# Patient Record
Sex: Male | Born: 1980
Health system: Southern US, Community
[De-identification: ages and names within clinical notes are randomized; demographics above are authoritative.]

## PROBLEM LIST (undated history)

## (undated) DIAGNOSIS — F419 Anxiety disorder, unspecified: Secondary | ICD-10-CM

## (undated) HISTORY — DX: Anxiety disorder, unspecified: F41.9

---

## 2005-04-29 ENCOUNTER — Emergency Department (HOSPITAL_COMMUNITY): Admission: EM | Admit: 2005-04-29 | Discharge: 2005-04-29 | Payer: Self-pay | Admitting: Emergency Medicine

## 2008-04-22 ENCOUNTER — Emergency Department (HOSPITAL_COMMUNITY): Admission: EM | Admit: 2008-04-22 | Discharge: 2008-04-22 | Payer: Self-pay | Admitting: Emergency Medicine

## 2008-05-22 ENCOUNTER — Emergency Department (HOSPITAL_COMMUNITY): Admission: EM | Admit: 2008-05-22 | Discharge: 2008-05-22 | Payer: Self-pay | Admitting: Emergency Medicine

## 2010-06-15 LAB — RAPID URINE DRUG SCREEN, HOSP PERFORMED
Amphetamines: NOT DETECTED
Barbiturates: NOT DETECTED
Benzodiazepines: NOT DETECTED
Cocaine: NOT DETECTED

## 2010-06-15 LAB — COMPREHENSIVE METABOLIC PANEL
ALT: 53 U/L (ref 0–53)
Alkaline Phosphatase: 69 U/L (ref 39–117)
BUN: 7 mg/dL (ref 6–23)
Calcium: 8.9 mg/dL (ref 8.4–10.5)
GFR calc Af Amer: >60 mL/min (ref >60)
Glucose, Bld: 144 mg/dL — ABNORMAL HIGH (ref 70–99)
Total Bilirubin: 0.5 mg/dL (ref 0.3–1.2)

## 2010-06-15 LAB — CBC
Hemoglobin: 15.1 g/dL (ref 13.0–17.0)
MCHC: 33.6 g/dL (ref 30.0–36.0)
MCV: 79.4 fL (ref 78.0–100.0)
Platelets: 174 10*3/uL (ref 150–400)
RDW: 13.4 % (ref 11.5–15.5)
WBC: 5.9 10*3/uL (ref 4.0–10.5)

## 2010-06-15 LAB — DIFFERENTIAL
Eosinophils Relative: 1 % (ref 0–5)
Lymphs Abs: 2 10*3/uL (ref 0.7–4.0)
Monocytes Absolute: 0.4 10*3/uL (ref 0.1–1.0)

## 2010-06-15 LAB — ACETAMINOPHEN LEVEL
Acetaminophen (Tylenol), Serum: 12.6 ug/mL (ref 10–30)
Acetaminophen (Tylenol), Serum: <10 ug/mL — ABNORMAL LOW (ref 10–30)

## 2010-10-06 ENCOUNTER — Observation Stay (HOSPITAL_COMMUNITY)
Admission: EM | Admit: 2010-10-06 | Discharge: 2010-10-07 | Disposition: A | Discharge status: Home / Self Care | Payer: Self-pay | Attending: Emergency Medicine | Admitting: Emergency Medicine

## 2010-10-06 DIAGNOSIS — E119 Type 2 diabetes mellitus without complications: Principal | ICD-10-CM | POA: Insufficient documentation

## 2010-10-06 DIAGNOSIS — R42 Dizziness and giddiness: Secondary | ICD-10-CM | POA: Insufficient documentation

## 2010-10-06 LAB — GLUCOSE, CAPILLARY: Glucose-Capillary: >600 mg/dL (ref 70–99)

## 2010-10-07 LAB — URINE MICROSCOPIC-ADD ON

## 2010-10-07 LAB — URINALYSIS, ROUTINE W REFLEX MICROSCOPIC
Bilirubin Urine: NEGATIVE
Ketones, ur: NEGATIVE mg/dL
Leukocytes, UA: NEGATIVE
Nitrite: NEGATIVE
Specific Gravity, Urine: 1.036 — ABNORMAL HIGH (ref 1.005–1.030)
Urobilinogen, UA: 0.2 mg/dL (ref 0.0–1.0)

## 2010-10-07 LAB — CBC
MCHC: 34.4 g/dL (ref 30.0–36.0)
Platelets: 150 10*3/uL (ref 150–400)
RBC: 5.75 MIL/uL (ref 4.22–5.81)
RDW: 12.7 % (ref 11.5–15.5)
WBC: 4.8 10*3/uL (ref 4.0–10.5)

## 2010-10-07 LAB — DIFFERENTIAL
Eosinophils Absolute: 0 10*3/uL (ref 0.0–0.7)
Eosinophils Relative: 1 % (ref 0–5)
Lymphocytes Relative: 48 % — ABNORMAL HIGH (ref 12–46)
Lymphs Abs: 2.3 10*3/uL (ref 0.7–4.0)
Monocytes Relative: 7 % (ref 3–12)
Neutro Abs: 2.1 10*3/uL (ref 1.7–7.7)
Neutrophils Relative %: 44 % (ref 43–77)

## 2010-10-07 LAB — BASIC METABOLIC PANEL
BUN: 7 mg/dL (ref 6–23)
CO2: 25 mEq/L (ref 19–32)
Calcium: 9.4 mg/dL (ref 8.4–10.5)
Chloride: 96 mEq/L (ref 96–112)
Glucose, Bld: 649 mg/dL (ref 70–99)

## 2010-10-07 LAB — GLUCOSE, CAPILLARY
Glucose-Capillary: 423 mg/dL — ABNORMAL HIGH (ref 70–99)
Glucose-Capillary: 311 mg/dL — ABNORMAL HIGH (ref 70–99)
Glucose-Capillary: 279 mg/dL — ABNORMAL HIGH (ref 70–99)

## 2010-11-25 ENCOUNTER — Other Ambulatory Visit: Payer: Self-pay | Admitting: Infectious Diseases

## 2010-11-25 ENCOUNTER — Ambulatory Visit
Admission: RE | Admit: 2010-11-25 | Discharge: 2010-11-25 | Disposition: A | Discharge status: Home / Self Care | Payer: No Typology Code available for payment source | Source: Ambulatory Visit | Attending: Infectious Diseases | Admitting: Infectious Diseases

## 2010-11-25 DIAGNOSIS — R7611 Nonspecific reaction to tuberculin skin test without active tuberculosis: Secondary | ICD-10-CM

## 2011-09-10 ENCOUNTER — Encounter (HOSPITAL_COMMUNITY): Payer: Self-pay

## 2011-09-10 ENCOUNTER — Emergency Department (HOSPITAL_COMMUNITY)
Admission: EM | Admit: 2011-09-10 | Discharge: 2011-09-10 | Disposition: A | Discharge status: Home / Self Care | Payer: Self-pay | Attending: Emergency Medicine | Admitting: Emergency Medicine

## 2011-09-10 DIAGNOSIS — K089 Disorder of teeth and supporting structures, unspecified: Secondary | ICD-10-CM | POA: Insufficient documentation

## 2011-09-10 DIAGNOSIS — K0889 Other specified disorders of teeth and supporting structures: Secondary | ICD-10-CM

## 2011-09-10 DIAGNOSIS — E119 Type 2 diabetes mellitus without complications: Secondary | ICD-10-CM | POA: Insufficient documentation

## 2011-09-10 MED ORDER — PENICILLIN V POTASSIUM 250 MG PO TABS: 250.0000 mg | ORAL_TABLET | Freq: Four times a day (QID) | ORAL | Status: AC

## 2011-09-10 MED ORDER — HYDROCODONE-ACETAMINOPHEN 5-325 MG PO TABS: 1.0000 | ORAL_TABLET | Freq: Four times a day (QID) | ORAL | Status: AC | PRN

## 2011-09-10 NOTE — ED Notes (Addendum)
Pt states he has had toothaches in the past and began having tooth pain.  Pt states he took pain meds with little relief.  Pt then began having pain to left side of face, especially the left eye.  Pt rates pain as 7/10 and described as aching.  Pt also states eye is watery.  Pt denies trauma to the face/eye.  Pt denies vision changes.

## 2011-09-10 NOTE — ED Provider Notes (Signed)
History     CSN: [id number]  Arrival date & time 09/10/11  0840   First MD Initiated Contact with Patient 09/10/11 [phone]      Chief Complaint  Patient presents with  . Eye Pain  . Dental Pain    (Consider location/radiation/quality/duration/timing/severity/associated sxs/prior treatment) HPI History from patient. 31 year old male with past medical history diabetes presents with dental pain. He states this started 3-4 days ago and has persisted. Pain is located to the left upper mouth. He denies noticing any drainage or swelling of his gums. No difficulty opening his mouth or eating although food exacerbates the pain. He has had some associated pain radiating to his left ear and watering of his eye. Also states that he has had a dual achy headache intermittently on the left side of his head. No fever or chills. No visual change or dizziness. Patient is not currently followed by a dentist.  Past Medical History  Diagnosis Date  . Diabetes mellitus     History reviewed. No pertinent past surgical history.  No family history on file.  History  Substance Use Topics  . Smoking status: Never Smoker   . Smokeless tobacco: Not on file  . Alcohol Use: Yes      Review of Systems as per history of present illness  Allergies  Review of patient's allergies indicates no known allergies.  Home Medications   Current Outpatient Rx  Name Route Sig Dispense Refill  . NAPROXEN SODIUM 220 MG PO TABS Oral Take 220 mg by mouth 4 (four) times daily as needed. pain    . HYDROCODONE-ACETAMINOPHEN 5-325 MG PO TABS Oral Take 1 tablet by mouth every 6 (six) hours as needed for pain. 15 tablet 0  . PENICILLIN V POTASSIUM 250 MG PO TABS Oral Take 1 tablet (250 mg total) by mouth 4 (four) times daily. 40 tablet 0    BP 129/81  Pulse 88  Temp 99.1 F (37.3 C) (Oral)  Resp 20  SpO2 99%  Physical Exam  Nursing note and vitals reviewed. Constitutional: He is oriented to person, place, and time.  He appears well-developed and well-nourished. No distress.  HENT:  Head: Normocephalic and atraumatic.  Mouth/Throat:         Tender to palpation as diagrammed. Dental decay seen to these 2 teeth. No obvious gum swelling or purulence or evidence of abscess. Uvula midline. No trismus or malocclusion, handling secretions.  Eyes: Conjunctivae and EOM are normal. Pupils are equal, round, and reactive to light. Right eye exhibits no discharge. Left eye exhibits no discharge.  Neck: Normal range of motion. Neck supple.  Cardiovascular: Normal rate.   Pulmonary/Chest: Effort normal.  Musculoskeletal: Normal range of motion.  Lymphadenopathy:    He has no cervical adenopathy.  Neurological: He is alert and oriented to person, place, and time. No cranial nerve deficit.  Skin: Skin is warm and dry. He is not diaphoretic.  Psychiatric: He has a normal mood and affect.    ED Course  Procedures (including critical care time)  Labs Reviewed - No data to display No results found.   1. Pain, dental       MDM  Patient presents with dental pain. He also endorses watering of his eye and unilateral headache on the side of his pain. Suspect these are stemming from his dental pain. Will give coverage with penicillin and treat pain. Emphasized importance of following up with dentistry. Reasons to return discussed.        Santina Evans  [place], [place] 09/10/11 [phone]

## 2011-09-11 NOTE — ED Provider Notes (Signed)
Medical screening examination/treatment/procedure(s) were performed by non-physician practitioner and as supervising physician I was immediately available for consultation/collaboration.  Letesha Klecker, MD 09/11/11 0719 

## 2012-03-22 ENCOUNTER — Emergency Department (HOSPITAL_COMMUNITY)
Admission: EM | Admit: 2012-03-22 | Discharge: 2012-03-22 | Disposition: A | Discharge status: Home / Self Care | Payer: Self-pay | Attending: Emergency Medicine | Admitting: Emergency Medicine

## 2012-03-22 ENCOUNTER — Emergency Department (HOSPITAL_COMMUNITY): Payer: Self-pay

## 2012-03-22 ENCOUNTER — Inpatient Hospital Stay (HOSPITAL_COMMUNITY)
Admission: EM | Admit: 2012-03-22 | Discharge: 2012-03-24 | DRG: 392 | Disposition: A | Discharge status: Home / Self Care | Payer: Self-pay | Attending: Family Medicine | Admitting: Family Medicine

## 2012-03-22 ENCOUNTER — Encounter (HOSPITAL_COMMUNITY): Payer: Self-pay | Admitting: Emergency Medicine

## 2012-03-22 DIAGNOSIS — R112 Nausea with vomiting, unspecified: Secondary | ICD-10-CM | POA: Diagnosis present

## 2012-03-22 DIAGNOSIS — R109 Unspecified abdominal pain: Secondary | ICD-10-CM | POA: Diagnosis present

## 2012-03-22 DIAGNOSIS — A084 Viral intestinal infection, unspecified: Secondary | ICD-10-CM | POA: Diagnosis present

## 2012-03-22 DIAGNOSIS — K529 Noninfective gastroenteritis and colitis, unspecified: Secondary | ICD-10-CM

## 2012-03-22 DIAGNOSIS — R739 Hyperglycemia, unspecified: Secondary | ICD-10-CM

## 2012-03-22 DIAGNOSIS — E119 Type 2 diabetes mellitus without complications: Secondary | ICD-10-CM

## 2012-03-22 DIAGNOSIS — A09 Infectious gastroenteritis and colitis, unspecified: Secondary | ICD-10-CM

## 2012-03-22 DIAGNOSIS — A088 Other specified intestinal infections: Principal | ICD-10-CM | POA: Diagnosis present

## 2012-03-22 DIAGNOSIS — E1169 Type 2 diabetes mellitus with other specified complication: Secondary | ICD-10-CM | POA: Insufficient documentation

## 2012-03-22 DIAGNOSIS — Z9119 Patient's noncompliance with other medical treatment and regimen: Secondary | ICD-10-CM

## 2012-03-22 DIAGNOSIS — E860 Dehydration: Secondary | ICD-10-CM | POA: Insufficient documentation

## 2012-03-22 DIAGNOSIS — R7989 Other specified abnormal findings of blood chemistry: Secondary | ICD-10-CM

## 2012-03-22 DIAGNOSIS — Z91199 Patient's noncompliance with other medical treatment and regimen due to unspecified reason: Secondary | ICD-10-CM

## 2012-03-22 DIAGNOSIS — R824 Acetonuria: Secondary | ICD-10-CM

## 2012-03-22 DIAGNOSIS — Z79899 Other long term (current) drug therapy: Secondary | ICD-10-CM | POA: Insufficient documentation

## 2012-03-22 DIAGNOSIS — IMO0001 Reserved for inherently not codable concepts without codable children: Secondary | ICD-10-CM | POA: Diagnosis present

## 2012-03-22 LAB — CBC WITH DIFFERENTIAL/PLATELET
Basophils Absolute: 0 10*3/uL (ref 0.0–0.1)
Eosinophils Relative: 1 % (ref 0–5)
Lymphocytes Relative: 40 % (ref 12–46)
Neutro Abs: 1.5 10*3/uL — ABNORMAL LOW (ref 1.7–7.7)
Platelets: 121 10*3/uL — ABNORMAL LOW (ref 150–400)
RDW: 12.5 % (ref 11.5–15.5)
WBC: 3.7 10*3/uL — ABNORMAL LOW (ref 4.0–10.5)

## 2012-03-22 LAB — URINALYSIS, ROUTINE W REFLEX MICROSCOPIC
Glucose, UA: >1000 mg/dL — AB
Hgb urine dipstick: NEGATIVE
Hgb urine dipstick: NEGATIVE
Ketones, ur: >80 mg/dL — AB
Protein, ur: NEGATIVE mg/dL
Specific Gravity, Urine: 1.038 — ABNORMAL HIGH (ref 1.005–1.030)
Urobilinogen, UA: 0.2 mg/dL (ref 0.0–1.0)

## 2012-03-22 LAB — COMPREHENSIVE METABOLIC PANEL
ALT: 11 U/L (ref 0–53)
AST: 13 U/L (ref 0–37)
CO2: 27 mEq/L (ref 19–32)
Calcium: 9.8 mg/dL (ref 8.4–10.5)
GFR calc non Af Amer: >90 mL/min (ref >90)
Sodium: 136 mEq/L (ref 135–145)
Total Protein: 7.6 g/dL (ref 6.0–8.3)

## 2012-03-22 LAB — POCT I-STAT, CHEM 8
Glucose, Bld: 214 mg/dL — ABNORMAL HIGH (ref 70–99)
HCT: 49 % (ref 39.0–52.0)
HCT: 41 % (ref 39.0–52.0)
Hemoglobin: 16.7 g/dL (ref 13.0–17.0)
Hemoglobin: 13.9 g/dL (ref 13.0–17.0)
Potassium: 3.4 mEq/L — ABNORMAL LOW (ref 3.5–5.1)
Potassium: 3.7 mEq/L (ref 3.5–5.1)
Sodium: 139 mEq/L (ref 135–145)
Sodium: 139 mEq/L (ref 135–145)
TCO2: 26 mmol/L (ref 0–100)

## 2012-03-22 LAB — URINE MICROSCOPIC-ADD ON

## 2012-03-22 LAB — GLUCOSE, CAPILLARY
Glucose-Capillary: 228 mg/dL — ABNORMAL HIGH (ref 70–99)
Glucose-Capillary: 218 mg/dL — ABNORMAL HIGH (ref 70–99)
Glucose-Capillary: 159 mg/dL — ABNORMAL HIGH (ref 70–99)

## 2012-03-22 LAB — CBC
HCT: 40 % (ref 39.0–52.0)
Hemoglobin: 13.7 g/dL (ref 13.0–17.0)
MCHC: 34.3 g/dL (ref 30.0–36.0)
RBC: 5.2 MIL/uL (ref 4.22–5.81)

## 2012-03-22 LAB — LIPID PANEL
Cholesterol: 119 mg/dL (ref 0–200)
Total CHOL/HDL Ratio: 3.5 RATIO
Triglycerides: 59 mg/dL (ref <150)
VLDL: 12 mg/dL (ref 0–40)

## 2012-03-22 LAB — CREATININE, SERUM
GFR calc Af Amer: >90 mL/min (ref >90)
GFR calc non Af Amer: >90 mL/min (ref >90)

## 2012-03-22 MED ORDER — MORPHINE SULFATE 4 MG/ML IJ SOLN
6.0000 mg | Freq: Once | INTRAMUSCULAR | Status: AC
  Administered 2012-03-22: 6 mg via INTRAVENOUS
  Filled 2012-03-22: qty 2

## 2012-03-22 MED ORDER — ONDANSETRON HCL 4 MG/2ML IJ SOLN
4.0000 mg | Freq: Once | INTRAMUSCULAR | Status: AC
  Administered 2012-03-22: 4 mg via INTRAVENOUS
  Filled 2012-03-22: qty 2

## 2012-03-22 MED ORDER — ONDANSETRON HCL 4 MG PO TABS: 4.0000 mg | ORAL_TABLET | Freq: Four times a day (QID) | ORAL | Status: DC | PRN

## 2012-03-22 MED ORDER — ONDANSETRON HCL 4 MG/2ML IJ SOLN
4.0000 mg | Freq: Four times a day (QID) | INTRAMUSCULAR | Status: DC | PRN
  Administered 2012-03-23: 4 mg via INTRAVENOUS
  Filled 2012-03-22: qty 2

## 2012-03-22 MED ORDER — HYDROMORPHONE HCL PF 1 MG/ML IJ SOLN
1.0000 mg | Freq: Once | INTRAMUSCULAR | Status: AC
  Administered 2012-03-22: 1 mg via INTRAVENOUS
  Filled 2012-03-22: qty 1

## 2012-03-22 MED ORDER — SODIUM CHLORIDE 0.9 % IV BOLUS (SEPSIS)
1000.0000 mL | Freq: Once | INTRAVENOUS | Status: AC
  Administered 2012-03-22: 1000 mL via INTRAVENOUS

## 2012-03-22 MED ORDER — METFORMIN HCL 500 MG PO TABS: 500.0000 mg | ORAL_TABLET | Freq: Two times a day (BID) | ORAL | Status: DC

## 2012-03-22 MED ORDER — CIPROFLOXACIN HCL 500 MG PO TABS
500.0000 mg | ORAL_TABLET | Freq: Once | ORAL | Status: AC
  Administered 2012-03-22: 500 mg via ORAL
  Filled 2012-03-22: qty 1

## 2012-03-22 MED ORDER — HEPARIN SODIUM (PORCINE) 5000 UNIT/ML IJ SOLN
5000.0000 [IU] | Freq: Three times a day (TID) | INTRAMUSCULAR | Status: DC
  Administered 2012-03-22 – 2012-03-24 (×6): 5000 [IU] via SUBCUTANEOUS
  Filled 2012-03-22 (×8): qty 1

## 2012-03-22 MED ORDER — INSULIN ASPART 100 UNIT/ML ~~LOC~~ SOLN
0.0000 [IU] | Freq: Three times a day (TID) | SUBCUTANEOUS | Status: DC
  Administered 2012-03-23: 2 [IU] via SUBCUTANEOUS
  Administered 2012-03-23 (×2): 3 [IU] via SUBCUTANEOUS
  Administered 2012-03-24 (×2): 5 [IU] via SUBCUTANEOUS

## 2012-03-22 MED ORDER — MORPHINE SULFATE 4 MG/ML IJ SOLN
4.0000 mg | Freq: Once | INTRAMUSCULAR | Status: AC
  Administered 2012-03-22: 4 mg via INTRAVENOUS
  Filled 2012-03-22: qty 1

## 2012-03-22 MED ORDER — SODIUM CHLORIDE 0.9 % IV BOLUS (SEPSIS)
1000.0000 mL | INTRAVENOUS | Status: AC
  Administered 2012-03-22: 1000 mL via INTRAVENOUS

## 2012-03-22 MED ORDER — METRONIDAZOLE 500 MG PO TABS
500.0000 mg | ORAL_TABLET | Freq: Once | ORAL | Status: AC
  Administered 2012-03-22: 500 mg via ORAL
  Filled 2012-03-22: qty 1

## 2012-03-22 MED ORDER — SODIUM CHLORIDE 0.9 % IV SOLN
Freq: Once | INTRAVENOUS | Status: AC
  Administered 2012-03-22: 07:00:00 via INTRAVENOUS

## 2012-03-22 MED ORDER — ACETAMINOPHEN 650 MG RE SUPP: 650.0000 mg | Freq: Four times a day (QID) | RECTAL | Status: DC | PRN

## 2012-03-22 MED ORDER — ACETAMINOPHEN 325 MG PO TABS: 650.0000 mg | ORAL_TABLET | Freq: Four times a day (QID) | ORAL | Status: DC | PRN

## 2012-03-22 MED ORDER — ONDANSETRON 4 MG PO TBDP: 4.0000 mg | ORAL_TABLET | Freq: Three times a day (TID) | ORAL | Status: DC | PRN

## 2012-03-22 MED ORDER — ONDANSETRON 4 MG PO TBDP
4.0000 mg | ORAL_TABLET | Freq: Once | ORAL | Status: AC
  Administered 2012-03-22: 4 mg via ORAL
  Filled 2012-03-22: qty 1

## 2012-03-22 MED ORDER — MORPHINE SULFATE 2 MG/ML IJ SOLN
2.0000 mg | INTRAMUSCULAR | Status: DC | PRN
  Administered 2012-03-22 – 2012-03-23 (×2): 2 mg via INTRAVENOUS
  Filled 2012-03-22 (×2): qty 1

## 2012-03-22 NOTE — ED Notes (Signed)
Care transferred and report given to [place], [place]

## 2012-03-22 NOTE — Progress Notes (Signed)
1840 Patient arrived to floor from ED. Patient is nontelemetry.

## 2012-03-22 NOTE — ED Notes (Signed)
Pt states he is out of his medication to treat his DM

## 2012-03-22 NOTE — ED Notes (Signed)
Pt states N/V/D started 2 days ago, HA, anorexia, abdominal pain.

## 2012-03-22 NOTE — ED Notes (Signed)
Patient states his pain is getting worse, wants to be seen again.  Patient was discharged approximately 30 minutes prior coming to [facility].

## 2012-03-22 NOTE — H&P (Signed)
Undray Allman is an 32 y.o. male.   Chief Complaint: abdominal pain, nausea/vomiting  Assessment and plan: This is a 79 YOM with a history of diabetes mellitus who stopped taking his metformin and otherwise healthy who presents with a week of worsening abdominal pain that is now associated with nausea/vomiting and subsequent dehydration.  D/Dx: gastroenteritis, colitis, inflammatory bowel disease, irritable bowel syndrome   Most likely gastroenteritis, however, due to his refractory abdominal pain that brought him back to the ED and dehydration, we will admit for observation.   -NS bolus -MIVF: NS @ 150 mL/hr -Check HIV, lipase, ESR/CRP, urine GC/Chlamydia, UDS -AM CBC, BMET -Zofran prn nausea -morphine prn pain -We will not start antibiotics at this time because most likely viral process. However, we may consider in setting of fever, rising WBC, worsening abdominal pain -Follow-up hemoccult   ENDO Diabetes, not on insulin -SSI -He reports nausea on metformin; he tried for 6 months. Consider alternative oral medication when he is more stable.  -HgbA1c  PPx -DVT PPx: heparin SQ  FEN/GI -See above regarding fluids -NPO  DISPO: pending clinical improvement   CODE: FULL     HPI: He presents with the above complaints.  He started having crampy lower abdominal pain about a week ago but it started becoming significantly worse a few days ago and was associated with nausea and vomiting. He last vomited yesterday. He has not been able to eat or drink for the past couple of days. He denies diarrhea and had 2 normal bowel movements without noticeable stool in the past 3 days.  He presented to the ED early this morning and was discharged but came back shortly after due to worsening of the abdominal pain.   At this time he says the abdominal pain is less severe. He denies nausea.   He is sexually active with his long distance girlfriend only and last had intercourse March 2013.    Past Medical History  Diagnosis Date  . Diabetes mellitus   He stopped taking metformin because he did not like taking medications.  No family history on file. Social History:  reports that he has never smoked. He does not have any smokeless tobacco history on file. He reports that he drinks alcohol. He reports that he does not use illicit drugs. He lives with his father and brother in [place]. He moves from [place] in 2005.   Allergies: No Known Allergies   Results for orders placed during the hospital encounter of 03/22/12 (from the past 48 hour(s))  GLUCOSE, CAPILLARY     Status: Abnormal   Collection Time   03/22/12  7:03 AM      Component Value Range Comment   Glucose-Capillary 218 (*) 70 - 99 mg/dL    Comment 1 Hoff Brooke     CBC WITH DIFFERENTIAL     Status: Abnormal   Collection Time   03/22/12  7:19 AM      Component Value Range Comment   WBC 3.7 (*) 4.0 - 10.5 K/uL    RBC 5.22  4.22 - 5.81 MIL/uL    Hemoglobin 13.5  13.0 - 17.0 g/dL    HCT 40.9  81.1 - 91.4 %    MCV 76.8 (*) 78.0 - 100.0 fL    MCH 25.9 (*) 26.0 - 34.0 pg    MCHC 33.7  30.0 - 36.0 g/dL    RDW 78.2  95.6 - 21.3 %    Platelets 121 (*) 150 - 400 K/uL  Neutrophils Relative 40 (*) 43 - 77 %    Neutro Abs 1.5 (*) 1.7 - 7.7 K/uL    Lymphocytes Relative 40  12 - 46 %    Lymphs Abs 1.5  0.7 - 4.0 K/uL    Monocytes Relative 19 (*) 3 - 12 %    Monocytes Absolute 0.7  0.1 - 1.0 K/uL    Eosinophils Relative 1  0 - 5 %    Eosinophils Absolute 0.0  0.0 - 0.7 K/uL    Basophils Relative 0  0 - 1 %    Basophils Absolute 0.0  0.0 - 0.1 K/uL   POCT I-STAT, CHEM 8     Status: Abnormal   Collection Time   03/22/12  7:30 AM      Component Value Range Comment   Sodium 139  135 - 145 mEq/L    Potassium 3.7  3.5 - 5.1 mEq/L    Chloride 101  96 - 112 mEq/L    BUN 5 (*) 6 - 23 mg/dL    Creatinine, Ser 1.30  0.50 - 1.35 mg/dL    Glucose, Bld 865 (*) 70 - 99 mg/dL    Calcium, Ion 7.84  6.96 - 1.23 mmol/L     TCO2 26  0 - 100 mmol/L    Hemoglobin 13.9  13.0 - 17.0 g/dL    HCT 29.5  28.4 - 13.2 %   KETONES, QUALITATIVE     Status: Abnormal   Collection Time   03/22/12  8:08 AM      Component Value Range Comment   Acetone, Bld SMALL (*) NEGATIVE   URINALYSIS, ROUTINE W REFLEX MICROSCOPIC     Status: Abnormal   Collection Time   03/22/12  1:31 PM      Component Value Range Comment   Color, Urine YELLOW  YELLOW    APPearance CLEAR  CLEAR    Specific Gravity, Urine 1.034 (*) 1.005 - 1.030    pH 5.5  5.0 - 8.0    Glucose, UA >1000 (*) NEGATIVE mg/dL    Hgb urine dipstick NEGATIVE  NEGATIVE    Bilirubin Urine SMALL (*) NEGATIVE    Ketones, ur >80 (*) NEGATIVE mg/dL    Protein, ur NEGATIVE  NEGATIVE mg/dL    Urobilinogen, UA 0.2  0.0 - 1.0 mg/dL    Nitrite NEGATIVE  NEGATIVE    Leukocytes, UA NEGATIVE  NEGATIVE   URINE MICROSCOPIC-ADD ON     Status: Normal   Collection Time   03/22/12  1:31 PM      Component Value Range Comment   Squamous Epithelial / LPF RARE  RARE    WBC, UA 0-2  <3 WBC/hpf    Ct Abdomen Pelvis Wo Contrast  03/22/2012  *RADIOLOGY REPORT*  Clinical Data: Abdominal pain.  CT ABDOMEN AND PELVIS WITHOUT CONTRAST  Technique:  Multidetector CT imaging of the abdomen and pelvis was performed following the standard protocol without intravenous contrast.  Comparison: None.  Findings:  Lung Bases: Unremarkable.  Abdomen/Pelvis:  There are no abnormal calcifications within the collecting system of either kidney, along the course of either ureter, or within the lumen of the urinary bladder to suggest urinary tract calculi.  No hydroureteronephrosis or perinephric stranding to suggest urinary tract obstruction at this time.  The unenhanced appearance of the liver, gallbladder, pancreas, bilateral adrenal glands and bilateral kidneys is unremarkable. Spleen is mildly enlarged (13.5 cm AP).  There appears to be some colonic wall thickening and mild hypervascularity and  haziness in the associated  mesocolon extending from the transverse colon into the descending colon.  Normal appendix.  No significant volume of ascites.  No pneumoperitoneum.  No pathologic distension of small bowel.  No definite pathologic lymphadenopathy identified within the abdomen or pelvis on today's noncontrast CT examination. Prostate and urinary bladder are unremarkable in appearance.  Musculoskeletal: There are no aggressive appearing lytic or blastic lesions noted in the visualized portions of the skeleton.  IMPRESSION: 1.  No abnormal urinary tract calculi or findings to suggest urinary tract obstruction. 2.  However, there is circumferential wall thickening in the colon extending from the transverse colon into the descending colon, with some associated hypervascularity and mild edema and/or inflammation in the associated mesocolon.  Clinical correlation for signs and symptoms of colitis is recommended. 3.  Mild splenomegaly.   Original Report Authenticated By: Trudie Reed, M.D.    ROS Denies fevers or chills Denies dysuria/urgency/frequency Denies penile discharge, irritation Denies chest pain, dyspnea  Blood pressure 136/86, pulse 74, temperature 98.9 F (37.2 C), temperature source Oral, resp. rate 17, SpO2 100.00%. Physical Exam  GEN: NAD; well-developed, -nourished PSYCH: mild accent but speaks [place] well; appropriate to questions; alert and oriented NEURO: moves all extremities well, no focal deficits HEENT:   Head: /AT   Eyes: normal conjunctiva without injection or tearing   Ears: TM clear bilaterally with good light reflex and without erythema or air-fluid level   Nose: no rhinorrhea, normal turbinates   Mouth: dry mucous membranes; no tonsillar adenopathy; no oropharyngeal erythema NECK: no LAD SKIN: 4-5 sec capillary refill CV: RRR, normal S1/S2, 2/6 systolic murmur RUSB PULM: NI WOB; CTAB without w/r/r ABD: hyperactive bowel sounds, soft, mild-moderate tenderness lower abdomen L>R, no  distension EXT: no edema RECTUM: good tone; hemoccult stool card sent to lab   [facility], ANGELA 03/22/2012, 4:11 PM

## 2012-03-22 NOTE — ED Notes (Signed)
CBG was 228. Notified Nurse Minerva Areola.

## 2012-03-22 NOTE — H&P (Signed)
I examined this patient and discussed the care plan with Dr Sharol Given  and the [facility] team and agree with assessment and plan as documented in the admission note above. He was comfortable after passing gas, but pain recurred while I was in the room. Abdomen was quiet with lower pain worse on the left. Heart G1/6 SEM aortic area. He reports his diabetes improved after losing weight from over 300 lbs.

## 2012-03-22 NOTE — ED Provider Notes (Signed)
History     CSN: [id number]  Arrival date & time 03/22/12  1191   First MD Initiated Contact with Patient 03/22/12 [phone]      Chief Complaint  Patient presents with  . Abdominal Pain    (Consider location/radiation/quality/duration/timing/severity/associated sxs/prior treatment) HPI Jonathan [name] is a 32 y.o. male who presents with complaint of abdominal pain. Pt was just seen and discharged few hrs ago for the same. Pt was found to have elevated CBG of 228, and ketonuria. He was given fluids. He felt better on discharge. Pt states as soon as he was discharge the pain came back more intense than before. Pt denies fever, chills. States did have nausea, vomiting. Last bowel movement yesterday, normal. No blood in stool or emesis. Pain mainly in the lower abdomen radiating to bilateral flank. Nothing makes pain better, palpation and movement makes it worse.   Past Medical History  Diagnosis Date  . Diabetes mellitus     History reviewed. No pertinent past surgical history.  No family history on file.  History  Substance Use Topics  . Smoking status: Never Smoker   . Smokeless tobacco: Not on file  . Alcohol Use: Yes      Review of Systems  Constitutional: Negative for fever and chills.  HENT: Negative for neck pain and neck stiffness.   Respiratory: Negative.   Cardiovascular: Negative.   Gastrointestinal: Positive for nausea, vomiting and abdominal pain. Negative for diarrhea, constipation and blood in stool.  Genitourinary: Positive for flank pain. Negative for dysuria, urgency, hematuria, scrotal swelling, difficulty urinating and testicular pain.  Musculoskeletal: Negative.   Skin: Negative.   Neurological: Positive for weakness. Negative for numbness and headaches.  Hematological: Negative.     Allergies  Review of patient's allergies indicates no known allergies.  Home Medications  No current outpatient prescriptions on file.  BP 138/85  Pulse 86  Temp  98.9 F (37.2 C) (Oral)  Resp 20  SpO2 98%  Physical Exam  Nursing note and vitals reviewed. Constitutional: He is oriented to person, place, and time. He appears well-developed and well-nourished. No distress.  HENT:  Head: Normocephalic.  Eyes: Conjunctivae normal are normal.  Neck: Neck supple.  Cardiovascular: Normal rate, regular rhythm and normal heart sounds.   Pulmonary/Chest: Effort normal and breath sounds normal. No respiratory distress. He has no wheezes. He has no rales.  Abdominal: Soft.       LLQ, RLQ tenderness, suprapubic tenderness. No guarding. No rebound  Musculoskeletal: He exhibits no edema.  Neurological: He is alert and oriented to person, place, and time.  Skin: Skin is warm and dry.  Psychiatric: He has a normal mood and affect. His behavior is normal.    ED Course  Procedures (including critical care time)  Pt with abdomina pain, was just discharged 2 hrs ago. Vomiting. Will repeat istat. No CBC done over night.  Results for orders placed during the hospital encounter of 03/22/12  CBC WITH DIFFERENTIAL      Component Value Range   WBC 3.7 (*) 4.0 - 10.5 K/uL   RBC 5.22  4.22 - 5.81 MIL/uL   Hemoglobin 13.5  13.0 - 17.0 g/dL   HCT 95.6  21.3 - 08.6 %   MCV 76.8 (*) 78.0 - 100.0 fL   MCH 25.9 (*) 26.0 - 34.0 pg   MCHC 33.7  30.0 - 36.0 g/dL   RDW 57.8  46.9 - 62.9 %   Platelets 121 (*) 150 - 400 K/uL  Neutrophils Relative 40 (*) 43 - 77 %   Neutro Abs 1.5 (*) 1.7 - 7.7 K/uL   Lymphocytes Relative 40  12 - 46 %   Lymphs Abs 1.5  0.7 - 4.0 K/uL   Monocytes Relative 19 (*) 3 - 12 %   Monocytes Absolute 0.7  0.1 - 1.0 K/uL   Eosinophils Relative 1  0 - 5 %   Eosinophils Absolute 0.0  0.0 - 0.7 K/uL   Basophils Relative 0  0 - 1 %   Basophils Absolute 0.0  0.0 - 0.1 K/uL  GLUCOSE, CAPILLARY      Component Value Range   Glucose-Capillary 218 (*) 70 - 99 mg/dL   Comment 1 Bertz Kester    POCT I-STAT, CHEM 8      Component Value Range    Sodium 139  135 - 145 mEq/L   Potassium 3.7  3.5 - 5.1 mEq/L   Chloride 101  96 - 112 mEq/L   BUN 5 (*) 6 - 23 mg/dL   Creatinine, Ser 1.61  0.50 - 1.35 mg/dL   Glucose, Bld 096 (*) 70 - 99 mg/dL   Calcium, Ion 0.45  4.09 - 1.23 mmol/L   TCO2 26  0 - 100 mmol/L   Hemoglobin 13.9  13.0 - 17.0 g/dL   HCT 81.1  91.4 - 78.2 %  KETONES, QUALITATIVE      Component Value Range   Acetone, Bld SMALL (*) NEGATIVE  URINALYSIS, ROUTINE W REFLEX MICROSCOPIC      Component Value Range   Color, Urine YELLOW  YELLOW   APPearance CLEAR  CLEAR   Specific Gravity, Urine 1.034 (*) 1.005 - 1.030   pH 5.5  5.0 - 8.0   Glucose, UA >1000 (*) NEGATIVE mg/dL   Hgb urine dipstick NEGATIVE  NEGATIVE   Bilirubin Urine SMALL (*) NEGATIVE   Ketones, ur >80 (*) NEGATIVE mg/dL   Protein, ur NEGATIVE  NEGATIVE mg/dL   Urobilinogen, UA 0.2  0.0 - 1.0 mg/dL   Nitrite NEGATIVE  NEGATIVE   Leukocytes, UA NEGATIVE  NEGATIVE  URINE MICROSCOPIC-ADD ON      Component Value Range   Squamous Epithelial / LPF RARE  RARE   WBC, UA 0-2  <3 WBC/hpf    Pt continued to have pain. CT ordered.    Ct Abdomen Pelvis Wo Contrast  03/22/2012  *RADIOLOGY REPORT*  Clinical Data: Abdominal pain.  CT ABDOMEN AND PELVIS WITHOUT CONTRAST  Technique:  Multidetector CT imaging of the abdomen and pelvis was performed following the standard protocol without intravenous contrast.  Comparison: None.  Findings:  Lung Bases: Unremarkable.  Abdomen/Pelvis:  There are no abnormal calcifications within the collecting system of either kidney, along the course of either ureter, or within the lumen of the urinary bladder to suggest urinary tract calculi.  No hydroureteronephrosis or perinephric stranding to suggest urinary tract obstruction at this time.  The unenhanced appearance of the liver, gallbladder, pancreas, bilateral adrenal glands and bilateral kidneys is unremarkable. Spleen is mildly enlarged (13.5 cm AP).  There appears to be some colonic  wall thickening and mild hypervascularity and haziness in the associated mesocolon extending from the transverse colon into the descending colon.  Normal appendix.  No significant volume of ascites.  No pneumoperitoneum.  No pathologic distension of small bowel.  No definite pathologic lymphadenopathy identified within the abdomen or pelvis on today's noncontrast CT examination. Prostate and urinary bladder are unremarkable in appearance.  Musculoskeletal: There are no aggressive  appearing lytic or blastic lesions noted in the visualized portions of the skeleton.  IMPRESSION: 1.  No abnormal urinary tract calculi or findings to suggest urinary tract obstruction. 2.  However, there is circumferential wall thickening in the colon extending from the transverse colon into the descending colon, with some associated hypervascularity and mild edema and/or inflammation in the associated mesocolon.  Clinical correlation for signs and symptoms of colitis is recommended. 3.  Mild splenomegaly.   Original Report Authenticated By: Trudie Reed, M.D.    Pt was given cipro and flagyl for colitis. Pt continues to have abdominal pain, feels bad. UA repeated, continues to have >80 ketones in urine. Small acetone. Pt has now received about 4L of NS in the last 12 hrs. Given continued ketonuria, acetone in blood, colitis, will admit.   Spoke with [facility]ad, will come see.     1. Ketonuria   2. Hyperglycemia   3. Colitis   4. Acetonemia       MDM  Pt with continued pain, n/v. Unable to clear ketones from urine with 4L of NS. CT showing colitis. Acetone in blood. Will admit.         Lottie Mussel, PA 03/22/12 [phone]

## 2012-03-22 NOTE — ED Notes (Signed)
Pt reports he came into ED d/t sharp cramping abd pain. Pt reports the pain in intermittent and when it comes it comes very strongly. Pt in nad, sitting in bed on phone and watching tv. Pt reports he is having slight abd discomfort at the moment.

## 2012-03-22 NOTE — ED Provider Notes (Signed)
History     CSN: [id number]  Arrival date & time 03/22/12  0201   First MD Initiated Contact with Patient 03/22/12 0203      Chief Complaint  Patient presents with  . Nausea  . Emesis  . Diarrhea    (Consider location/radiation/quality/duration/timing/severity/associated sxs/prior treatment) HPI Comments: The patient is a 32 year old male who presents with a chief complaint of abdominal pain. He states that one week ago he developed gradual onset of generalized weakness and a headache for which he was taking ibuprofen without much relief. This is gradually gotten worse and approximately 20 hours ago he developed lower abdominal pain associated with nausea and vomiting. He has vomited 3 times. He does not have diarrhea, he has had small but normal bowel movements. He denies fevers, chills, dysuria and denies that he is thirsty or having polyuria. He does admit that he is out of his diabetes medications and has not taken them in some time. On Sunday he checked his blood sugar and it was over 300. His symptoms are gradually getting worse, they are severe, he is unable to hold down any fluids.  The history is provided by the patient and medical records.    Past Medical History  Diagnosis Date  . Diabetes mellitus     No past surgical history on file.  No family history on file.  History  Substance Use Topics  . Smoking status: Never Smoker   . Smokeless tobacco: Not on file  . Alcohol Use: Yes      Review of Systems  All other systems reviewed and are negative.    Allergies  Review of patient's allergies indicates no known allergies.  Home Medications   Current Outpatient Rx  Name  Route  Sig  Dispense  Refill  . METFORMIN HCL 500 MG PO TABS   Oral   Take 1 tablet (500 mg total) by mouth 2 (two) times daily with a meal.   60 tablet   1   . ONDANSETRON 4 MG PO TBDP   Oral   Take 1 tablet (4 mg total) by mouth every 8 (eight) hours as needed for nausea.   10  tablet   0     BP 157/102  Pulse 97  Temp 97.9 F (36.6 C) (Oral)  Resp 18  SpO2 100%  Physical Exam  Nursing note and vitals reviewed. Constitutional: He appears well-developed and well-nourished. No distress.  HENT:  Head: Normocephalic and atraumatic.  Mouth/Throat: No oropharyngeal exudate.       Mildly dehydrated mucous membranes  Eyes: Conjunctivae normal and EOM are normal. Pupils are equal, round, and reactive to light. Right eye exhibits no discharge. Left eye exhibits no discharge. No scleral icterus.  Neck: Normal range of motion. Neck supple. No JVD present. No thyromegaly present.  Cardiovascular: Normal rate, regular rhythm, normal heart sounds and intact distal pulses.  Exam reveals no gallop and no friction rub.   No murmur heard. Pulmonary/Chest: Effort normal and breath sounds normal. No respiratory distress. He has no wheezes. He has no rales.  Abdominal: Soft. Bowel sounds are normal. He exhibits no distension and no mass. There is tenderness ( Mild lower abdominal tenderness without guarding masses or peritoneal signs).       No pain at McBurney's point, no right upper quadrant tenderness  Musculoskeletal: Normal range of motion. He exhibits no edema and no tenderness.  Lymphadenopathy:    He has no cervical adenopathy.  Neurological: He is alert. Coordination  normal.  Skin: Skin is warm and dry. No rash noted. No erythema.  Psychiatric: He has a normal mood and affect. His behavior is normal.    ED Course  Procedures (including critical care time)  Labs Reviewed  COMPREHENSIVE METABOLIC PANEL - Abnormal; Notable for the following:    Potassium 3.4 (*)     Glucose, Bld 246 (*)     All other components within normal limits  URINALYSIS, ROUTINE W REFLEX MICROSCOPIC - Abnormal; Notable for the following:    Color, Urine AMBER (*)  BIOCHEMICALS MAY BE AFFECTED BY COLOR   APPearance CLOUDY (*)     Specific Gravity, Urine 1.038 (*)     Glucose, UA >1000  (*)     Bilirubin Urine SMALL (*)     Ketones, ur >80 (*)     Protein, ur 30 (*)     All other components within normal limits  GLUCOSE, CAPILLARY - Abnormal; Notable for the following:    Glucose-Capillary 228 (*)     All other components within normal limits  POCT I-STAT, CHEM 8 - Abnormal; Notable for the following:    Potassium 3.4 (*)     BUN 5 (*)     Glucose, Bld 236 (*)     Calcium, Ion 1.25 (*)     All other components within normal limits  LIPASE, BLOOD  URINE MICROSCOPIC-ADD ON   No results found.   1. Dehydration   2. Hyperglycemia       MDM  The patient has normal speech, normal gait, soft abdomen with suprapubic tenderness. Will need a urinalysis, laboratory workup for diabetic ketoacidosis. He will be given 2 L of IV fluids, CBG, labs, rule out DKA. Zofran for nausea, Dilaudid for pain, reevaluate. At this time his vital signs show mild hypertension but no tachycardia, no fever, no hypoxia.  Filed Vitals:   03/22/12 0206  BP: 157/102  Pulse: 97  Temp: 97.9 F (36.6 C)  Resp: 18    Pt has received 2 L of IVF, feeling "much better", has improved CBG, labs otherwise shows ketonuria, VS improved, pt stable for d/c, refilled meds for diabetes.       Vida Roller, MD 03/22/12 [phone]

## 2012-03-22 NOTE — ED Notes (Signed)
Abdominal pain started today.

## 2012-03-23 DIAGNOSIS — A084 Viral intestinal infection, unspecified: Secondary | ICD-10-CM | POA: Diagnosis present

## 2012-03-23 DIAGNOSIS — Z9119 Patient's noncompliance with other medical treatment and regimen: Secondary | ICD-10-CM

## 2012-03-23 DIAGNOSIS — Z91199 Patient's noncompliance with other medical treatment and regimen due to unspecified reason: Secondary | ICD-10-CM

## 2012-03-23 DIAGNOSIS — E119 Type 2 diabetes mellitus without complications: Secondary | ICD-10-CM | POA: Diagnosis present

## 2012-03-23 LAB — COMPREHENSIVE METABOLIC PANEL WITH GFR
ALT: 8 U/L (ref 0–53)
AST: 10 U/L (ref 0–37)
Albumin: 3.2 g/dL — ABNORMAL LOW (ref 3.5–5.2)
Alkaline Phosphatase: 64 U/L (ref 39–117)
BUN: 4 mg/dL — ABNORMAL LOW (ref 6–23)
CO2: 23 meq/L (ref 19–32)
Calcium: 8.9 mg/dL (ref 8.4–10.5)
Chloride: 103 meq/L (ref 96–112)
Creatinine, Ser: 0.73 mg/dL (ref 0.50–1.35)
GFR calc Af Amer: >90 mL/min
GFR calc non Af Amer: >90 mL/min
Glucose, Bld: 161 mg/dL — ABNORMAL HIGH (ref 70–99)
Potassium: 3.4 meq/L — ABNORMAL LOW (ref 3.5–5.1)
Sodium: 139 meq/L (ref 135–145)
Total Bilirubin: 0.3 mg/dL (ref 0.3–1.2)
Total Protein: 6.4 g/dL (ref 6.0–8.3)

## 2012-03-23 LAB — HEMOGLOBIN A1C
Hgb A1c MFr Bld: 13 % — ABNORMAL HIGH
Mean Plasma Glucose: 326 mg/dL — ABNORMAL HIGH

## 2012-03-23 LAB — CBC
HCT: 41.6 % (ref 39.0–52.0)
MCH: 25.7 pg — ABNORMAL LOW (ref 26.0–34.0)
MCHC: 33.7 g/dL (ref 30.0–36.0)
MCV: 76.3 fL — ABNORMAL LOW (ref 78.0–100.0)
Platelets: 139 10*3/uL — ABNORMAL LOW (ref 150–400)
RDW: 12.6 % (ref 11.5–15.5)

## 2012-03-23 LAB — GLUCOSE, CAPILLARY
Glucose-Capillary: 149 mg/dL — ABNORMAL HIGH (ref 70–99)
Glucose-Capillary: 171 mg/dL — ABNORMAL HIGH (ref 70–99)

## 2012-03-23 MED ORDER — LIVING WELL WITH DIABETES BOOK
Freq: Once | Status: AC
  Administered 2012-03-23: 15:00:00
  Filled 2012-03-23: qty 1

## 2012-03-23 MED ORDER — LOPERAMIDE HCL 2 MG PO CAPS
2.0000 mg | ORAL_CAPSULE | ORAL | Status: DC | PRN
  Administered 2012-03-23: 2 mg via ORAL
  Filled 2012-03-23: qty 2

## 2012-03-23 MED ORDER — BD GETTING STARTED TAKE HOME KIT: 1/2ML X 30G SYRINGES
1.0000 | Freq: Once | Status: AC
  Administered 2012-03-23: 1
  Filled 2012-03-23: qty 1

## 2012-03-23 NOTE — Progress Notes (Signed)
Utilization review completed. Kol Consuegra, RN, BSN. 

## 2012-03-23 NOTE — Discharge Summary (Signed)
Physician Discharge Summary  Patient ID: Jonathan [name] MRN: [id number] DOB/AGE: [birth year] 31 y.o.  Admit date: 03/22/2012 Discharge date: 03/23/2012  Admission Diagnoses:   Abdominal Pain Dehydration  Discharge Diagnoses:  Gastroenteritis Hyperglycemia  Discharged Condition: Improved  Hospital Course:  Pt is a 32 year old man with history of uncontrolled DM2 who presented to the ED with abdominal pain x 2 days and decreased appetite.  He was discharged from the ED earlier on the day of admission but returned due to worsening abdominal pain. He was also found to have sugars in the 200s and dehydrated.   #  Abdominal Pain:  In the ED, patient had CT scan which showed evidence of transverse and descending colon inflammation w/ concern for colitis.  He received Cipro 500 mg x 1 and Flagyl 500 mg x 1 for possible infectious etiology.  On admission, patient's clinical picture more concerning for viral gastroenteritis, so antibiotics were stopped.  CMET was normal except for elevated glucose, CBC with WBC at 3.6, platelets 136 otherwise normal.  Lipid profile with low HDL, otherwise normal.  HIV was nonreactive, CRP elevated at 3.2, ESR normal, FOBT negative.  Pt improved clinically overnight with IV fluids and Zofran.  Started on PRN Immodium to help with symptoms of diarrhea, which started between hospital days 1-2.  He was tolerating clears at time of discharge and abdominal pain was improved.    # Diabetes Mellitus T2:  Pt presented with CBGs in the 200s. He has not taken Metformin or seen his PCP in > 2 years.  His A1C in the hospital was 13.0.  He was placed on SSI while inpatient.  Pt was counseled by Diabetes Educator while inpatient and will follow up with [facility] on Feb 14th.  F/U with [facility] (PCP) on Monday Jan 27th.  Significant Diagnostic Studies: #CT Abdomen Pelvis IMPRESSION:  1. No abnormal urinary tract calculi or findings to suggest  urinary tract  obstruction.  2. However, there is circumferential wall thickening in the colon  extending from the transverse colon into the descending colon, with  some associated hypervascularity and mild edema and/or inflammation  in the associated mesocolon. Clinical correlation for signs and  symptoms of colitis is recommended.  3. Mild splenomegaly.  Disposition: 01-Home or Self Care  Discharge Orders    Future Appointments: Provider: Department: Dept Phone: Center:   04/13/2012 10:45 AM Kevan Mellendick, RD [facility] [phone] NDM     Future Orders Please Complete By Expires   Ambulatory referral to Nutrition and Diabetic Education      Comments:   Patient has a history of diabetes and was taking Metformin at one point but stopped taking because of nausea.  A1C 13.0% on 03/22/12.  Will be discharged on insulin. Patient is from [place] and came to the [place] in 2005.       Medication List     As of 03/23/2012 12:18 PM     FOLLOW UP ISSUES: # Diabetes management. HgbA1c found to be 13.0. Not taking medications. He stopped taking metformin a while ago.   APPOINTMENTS  - [facility] - Monday Jan 27th @ 9:30 AM  - [facility] - Friday Feb 14th @ 10:45 AM  Signed: [name] - Medical Student Jonathan [name] PGY-3

## 2012-03-23 NOTE — ED Provider Notes (Signed)
32 year old male seen earlier by myself who had nausea and vomiting and received IV fluids and medications with complete improvement of his symptoms. He presents back with increased abdominal pain. On exam the patient has increased abdominal tenderness though he is not peritoneal. He has persistent ketonuria on his laboratory workup, otherwise the patient is nontoxic but having difficulty tolerating fluids at this time. CT scan shows colitis, otherwise patient stable and can be admitted for symptomatic control and ongoing hydration.   Medical screening examination/treatment/procedure(s) were conducted as a shared visit with non-physician practitioner(s) and myself.  I personally evaluated the patient during the encounter    Vida Roller, MD 03/23/12 [phone]

## 2012-03-23 NOTE — Progress Notes (Addendum)
Daily Progress Note  Family Medicine Resident Pager [phone]  Patient name: Jonathan [name] Medical record number: [id number] Date of birth: January 16, 1981 Age: 32 y.o. Gender: male  Overview: Pt is 32 year old man with PMH significant for h/o uncontrolled DM2 who was admitted following presentation with severe crampy lower abdominal pain x 2 days.    Subjective: Overnight, patient reports two bowel movements, described as like diarrhea.  No blood or mucous noted in the stools.  He continues to complain of crampy lower abdominal pain that occurs in waves.  He has been NPO so unsure whether he can tolerate p.o. at this time.  Has been up to urinate and was able to sleep okay. Received Zofran x 1 which helped with his pain.    Objective: Vital signs in last 24 hours: Temp:  [98.2 F (36.8 C)-98.5 F (36.9 C)] 98.2 F (36.8 C) (01/24 0603) Pulse Rate:  [73-83] 74  (01/24 0603) Resp:  [11-20] 16  (01/24 0603) BP: (108-159)/(63-88) 108/64 mmHg (01/24 0603) SpO2:  [98 %-100 %] 98 % (01/24 0603) Weight:  [239 lb 5.3 oz (108.56 kg)] 239 lb 5.3 oz (108.56 kg) (01/23 1945) Last BM 03/23/12  Physical Exam Gen: well appearing young man, lying in bed, NAD HEENT: conjunctiva clear, slightly dry mucous membrane, no obvious ulcers Neck: no lymphadenopathy CV: regular rate and rhythm, II/VI systolic murmur, no gallops or rubs Resp: clear to auscultation bilaterally, no wheezes/crackles, normal work of breathing Abd: normal BS, no distention, no guarding, mild tenderness to palpation throughout abdomen worst in LLQ, no masses Ext: no edema BL  Lab Results:  Lab 03/23/12 0710 03/22/12 1912 03/22/12 0730 03/22/12 0719  HGB 14.0 13.7 13.9 --  HCT 41.6 40.0 41.0 --  WBC 3.6* 3.8* -- 3.7*  PLT 139* 132* -- 121*    Lab 03/22/12 1912 03/22/12 0730 03/22/12 0240 03/22/12 0211  NA -- 139 139 136  K -- 3.7 3.4* --  CL -- 101 99 97  CO2 -- -- -- 27  GLUCOSE -- 214* 236* 246*  BUN -- 5* 5* 7    CREATININE 0.78 0.80 0.90 0.89  CALCIUM -- -- -- 9.8  MG -- -- -- --  PHOS -- -- -- --   Hemoglobin A1C 13.0 CRP 3.2 (nl < 0.60) HIV NR ESR 1 Lipase normal  Studies/Results:  Abd/Pelvis CT: IMPRESSION: 1.  No abnormal urinary tract calculi or findings to suggest urinary tract obstruction. 2.  However, there is circumferential wall thickening in the colon extending from the transverse colon into the descending colon, with some associated hypervascularity and mild edema and/or inflammation in the associated mesocolon.  Clinical correlation for signs and symptoms of colitis is recommended. 3.  Mild splenomegaly.  Medications:  I have reviewed the patient's current medications. Scheduled:   . heparin  5,000 Units Subcutaneous Q8H  . insulin aspart  0-15 Units Subcutaneous TID WC   Continuous:  ZHY:[id number], acetaminophen, morphine injection, ondansetron (ZOFRAN) IV, ondansetron  Assessment/Plan:  Pt is 31 year old man with uncontrolled DM2 with lower abdominal pain and dehydration, most likely consistent with a viral gastroenteritis - improving from time of admission.  # Abdominal Pain: Likely infectious gastroenteritis - viral/bacterial, vs. Inflammatory bowel disease. IBD unlikely given time course and lack of hematochezia and no leukocytosis. Pt with continued abdominal pain and now with two episodes of loose stools.  Lipase, AST/ALT wnl.  FOBT pending.  CRP slightly elevated to 3.2, but could be consistent with a viral infection  or could be 2/2 his DM.  - Transition to clears, obs for ability to tolerate po  - Continue prn Zofran for vomiting  # DM: Pt with uncontrolled DM - Hgb A1c 13.0 this admission.  Currently on SSI as inpatient - sugars in 100s-300s, but patient NPO at this time. He has been on Metformin previously, not in several years.    - Will order Diabetic Educator  - Discharge on insulin and close follow up with [facility]    LOS: 1 day   Si Raider.  Clinton Sawyer, MD, [facility] 03/23/2012, 9:37 AM Family Medicine Resident, PGY-2 [phone] pager

## 2012-03-23 NOTE — Progress Notes (Signed)
I examined this patient and discussed the care plan with Dr Clinton Sawyer and the [facility] team and agree with assessment and plan as documented in the progress note above.

## 2012-03-23 NOTE — Progress Notes (Signed)
1/24  Spoke with patient about his diabetes.  Was diagnosed in 2011.  Took insulin and oral meds for about 6 months and then quit taking them.  HgbA1C is 13% on 1/23.  Spoke with him about importance of taking his medications.  Patient states that he does not have insurance.  Will need to have a PCP that can follow him at discharge.  Staff RNs to work with patient on insulin administration if he is to be discharged on insulin.  Will have patient watch DM videos while here and to give patient DM [place] notes before discharge.  Teach patient to check own CBGs.  Discussed importance of checking CBGs with meter at home.  Will continue to follow while in hospital. Jonathan Mince RN BSN CDE

## 2012-03-24 LAB — CBC
HCT: 41.8 % (ref 39.0–52.0)
Hemoglobin: 14.2 g/dL (ref 13.0–17.0)
MCHC: 34 g/dL (ref 30.0–36.0)
RBC: 5.46 MIL/uL (ref 4.22–5.81)

## 2012-03-24 LAB — BASIC METABOLIC PANEL
BUN: 3 mg/dL — ABNORMAL LOW (ref 6–23)
Chloride: 102 mEq/L (ref 96–112)
GFR calc Af Amer: >90 mL/min (ref >90)
Glucose, Bld: 210 mg/dL — ABNORMAL HIGH (ref 70–99)
Potassium: 3.3 mEq/L — ABNORMAL LOW (ref 3.5–5.1)
Sodium: 139 mEq/L (ref 135–145)

## 2012-03-24 LAB — GLUCOSE, CAPILLARY
Glucose-Capillary: 207 mg/dL — ABNORMAL HIGH (ref 70–99)
Glucose-Capillary: 210 mg/dL — ABNORMAL HIGH (ref 70–99)

## 2012-03-24 MED ORDER — ONDANSETRON HCL 4 MG PO TABS: 4.0000 mg | ORAL_TABLET | Freq: Four times a day (QID) | ORAL | Status: DC | PRN

## 2012-03-24 MED ORDER — GLIPIZIDE 5 MG PO TABS: 5.0000 mg | ORAL_TABLET | Freq: Two times a day (BID) | ORAL | Status: DC

## 2012-03-24 MED ORDER — ACETAMINOPHEN 325 MG PO TABS: 650.0000 mg | ORAL_TABLET | Freq: Four times a day (QID) | ORAL | Status: DC | PRN

## 2012-03-24 NOTE — Progress Notes (Signed)
Daily Progress Note  Family Medicine Resident Pager [phone]  Patient name: Jervon Ream Medical record number: [id number] Date of birth: 08-27-1980 Age: 32 y.o. Gender: male  Overview: Pt is 32 year old man with PMH significant for h/o uncontrolled DM2 who was admitted following presentation with severe crampy lower abdominal pain x 2 days.    Subjective: Patient has no complaints. He request a regular diet. He last had nausea last night.   Objective: Vital signs in last 24 hours: Temp:  [98.3 F (36.8 C)-98.7 F (37.1 C)] 98.3 F (36.8 C) (01/25 0629) Pulse Rate:  [62-74] 62  (01/25 0629) Resp:  [18-20] 20  (01/25 0629) BP: (116-127)/(71-80) 118/78 mmHg (01/25 0629) SpO2:  [99 %-100 %] 99 % (01/25 0629)   Physical Exam Gen: well appearing young man, lying in bed, NAD CV: regular rate and rhythm, II/VI systolic murmur, no gallops or rubs Resp: clear to auscultation bilaterally, no wheezes/crackles, normal work of breathing Abd: normal BS, no distention, no guarding, mild tenderness to palpation LLQ and suprapubic, no masses Ext: no edema BL  Lab Results:  Lab 03/24/12 0610 03/23/12 0710 03/22/12 1912  HGB 14.2 14.0 13.7  HCT 41.8 41.6 40.0  WBC 3.9* 3.6* 3.8*  PLT 163 139* 132*    Lab 03/24/12 0610 03/23/12 0710 03/22/12 1912 03/22/12 0730 03/22/12 0240 03/22/12 0211  NA 139 139 -- 139 139 136  K 3.3* 3.4* -- -- -- --  CL 102 103 -- 101 99 97  CO2 26 23 -- -- -- 27  GLUCOSE 210* 161* -- 214* 236* 246*  BUN 3* 4* -- 5* 5* 7  CREATININE 0.84 0.73 0.78 0.80 0.90 --  CALCIUM 9.2 8.9 -- -- -- 9.8  MG -- -- -- -- -- --  PHOS -- -- -- -- -- --   CBG (last 3)   Basename 03/24/12 0829 03/23/12 2156 03/23/12 1712  GLUCAP 207* 171* 199*     Hemoglobin A1C 13.0 CRP 3.2 (nl < 0.60) HIV NR ESR 1 Lipase normal  Studies/Results:  Abd/Pelvis CT: IMPRESSION: 1.  No abnormal urinary tract calculi or findings to suggest urinary tract obstruction. 2.  However, there  is circumferential wall thickening in the colon extending from the transverse colon into the descending colon, with some associated hypervascularity and mild edema and/or inflammation in the associated mesocolon.  Clinical correlation for signs and symptoms of colitis is recommended. 3.  Mild splenomegaly.  Medications:  I have reviewed the patient's current medications.  Assessment/Plan:  Pt is 32 year old man with uncontrolled DM2 with lower abdominal pain and dehydration, most likely consistent with a viral gastroenteritis - improving from time of admission.  # Abdominal Pain:  A: improving. Suspect viral gastroenteritis given progression to diarrhea and clinical improvement w/o antibiotics. P:  Advance diet.  D/C to home today with zofran prn.    # DM: Pt with uncontrolled DM - Hgb A1c 13.0 this admission.  Currently on SSI as inpatient - sugars in 100s-300s, but patient NPO at this time. He has been on Metformin previously, not in several years.    A: improved from admission.  P: D/C w/o insulin Patient to f/u closely with his PCP. Has appt on 03/26/12.    LOS: 2 days   Dessa Phi , MD 03/24/2012, 7:15 AM Family Medicine Resident, PGY-3 [phone] pager

## 2012-03-24 NOTE — Progress Notes (Signed)
1240 Discharge  instructions reviewed with patient . Verbalized and understand. Skin WNL Patient watching diabetic videos.

## 2012-03-24 NOTE — Progress Notes (Signed)
1430 Patient left floor ambulatory with staff. Prescriptions given to patient.

## 2012-03-24 NOTE — Progress Notes (Signed)
I examined this patient and discussed the care plan with Dr Armen Pickup and the [facility] team and agree with assessment and plan as documented in the progress note above. I recommended sending him out on Glipizide 5 mg daily, though is likely that he will need insulin sooner rather than later.

## 2012-03-26 NOTE — Discharge Summary (Signed)
I examined this patient and discussed the care plan with Dr Sharol Given and the [facility] team and agree with assessment and plan as documented in the discharge note above. I discussed with Dr Sharol Given the option of prescribing Glipizide until his illness improves and the advisability of starting insulin can be determined.

## 2012-04-13 ENCOUNTER — Ambulatory Visit: Payer: Self-pay | Admitting: Dietician

## 2013-03-15 ENCOUNTER — Emergency Department (HOSPITAL_COMMUNITY)
Admission: EM | Admit: 2013-03-15 | Discharge: 2013-03-16 | Disposition: A | Discharge status: Home / Self Care | Payer: Self-pay | Attending: Emergency Medicine | Admitting: Emergency Medicine

## 2013-03-15 DIAGNOSIS — IMO0002 Reserved for concepts with insufficient information to code with codable children: Secondary | ICD-10-CM

## 2013-03-15 DIAGNOSIS — R202 Paresthesia of skin: Secondary | ICD-10-CM

## 2013-03-15 DIAGNOSIS — G909 Disorder of the autonomic nervous system, unspecified: Secondary | ICD-10-CM | POA: Insufficient documentation

## 2013-03-15 DIAGNOSIS — R200 Anesthesia of skin: Secondary | ICD-10-CM

## 2013-03-15 DIAGNOSIS — E1149 Type 2 diabetes mellitus with other diabetic neurological complication: Secondary | ICD-10-CM | POA: Insufficient documentation

## 2013-03-15 DIAGNOSIS — Z9114 Patient's other noncompliance with medication regimen: Secondary | ICD-10-CM

## 2013-03-15 DIAGNOSIS — R3589 Other polyuria: Secondary | ICD-10-CM | POA: Insufficient documentation

## 2013-03-15 DIAGNOSIS — Z91199 Patient's noncompliance with other medical treatment and regimen due to unspecified reason: Secondary | ICD-10-CM | POA: Insufficient documentation

## 2013-03-15 DIAGNOSIS — G629 Polyneuropathy, unspecified: Secondary | ICD-10-CM

## 2013-03-15 DIAGNOSIS — R358 Other polyuria: Secondary | ICD-10-CM | POA: Insufficient documentation

## 2013-03-15 DIAGNOSIS — Z9119 Patient's noncompliance with other medical treatment and regimen: Secondary | ICD-10-CM | POA: Insufficient documentation

## 2013-03-15 DIAGNOSIS — E1165 Type 2 diabetes mellitus with hyperglycemia: Secondary | ICD-10-CM

## 2013-03-15 NOTE — ED Notes (Signed)
Pt arrived to the ED with a complaint of foot pain.  Pt is a diabetic and has been feeling a pins and needle sensation on the bottom of his feet.  Pt states he has not open sores of wounds, no redden areas.  Pt states he takes no medicne for his diabetes and has not checked his sugar in a "long time."

## 2013-03-16 ENCOUNTER — Encounter (HOSPITAL_COMMUNITY): Payer: Self-pay | Admitting: Emergency Medicine

## 2013-03-16 LAB — URINALYSIS, ROUTINE W REFLEX MICROSCOPIC
Bilirubin Urine: NEGATIVE
Hgb urine dipstick: NEGATIVE
KETONES UR: NEGATIVE mg/dL
LEUKOCYTES UA: NEGATIVE
NITRITE: NEGATIVE
PH: 7 (ref 5.0–8.0)
Protein, ur: NEGATIVE mg/dL
SPECIFIC GRAVITY, URINE: 1.038 — AB (ref 1.005–1.030)
Urobilinogen, UA: 0.2 mg/dL (ref 0.0–1.0)

## 2013-03-16 LAB — BASIC METABOLIC PANEL
BUN: 10 mg/dL (ref 6–23)
CHLORIDE: 96 meq/L (ref 96–112)
CO2: 26 meq/L (ref 19–32)
CREATININE: 0.92 mg/dL (ref 0.50–1.35)
Calcium: 9.3 mg/dL (ref 8.4–10.5)
GFR calc non Af Amer: >90 mL/min (ref >90)
Glucose, Bld: 393 mg/dL — ABNORMAL HIGH (ref 70–99)
POTASSIUM: 3.9 meq/L (ref 3.7–5.3)
Sodium: 136 mEq/L — ABNORMAL LOW (ref 137–147)

## 2013-03-16 LAB — GLUCOSE, CAPILLARY
GLUCOSE-CAPILLARY: 300 mg/dL — AB (ref 70–99)
GLUCOSE-CAPILLARY: 415 mg/dL — AB (ref 70–99)

## 2013-03-16 LAB — URINE MICROSCOPIC-ADD ON: Urine-Other: NONE SEEN

## 2013-03-16 MED ORDER — METFORMIN HCL 500 MG PO TABS: 500.0000 mg | ORAL_TABLET | Freq: Two times a day (BID) | ORAL | Status: DC

## 2013-03-16 MED ORDER — ACETAMINOPHEN 500 MG PO TABS
1000.0000 mg | ORAL_TABLET | Freq: Once | ORAL | Status: AC
  Administered 2013-03-16: 1000 mg via ORAL
  Filled 2013-03-16: qty 2

## 2013-03-16 MED ORDER — SODIUM CHLORIDE 0.9 % IV BOLUS (SEPSIS)
1000.0000 mL | Freq: Once | INTRAVENOUS | Status: AC
  Administered 2013-03-16: 1000 mL via INTRAVENOUS

## 2013-03-16 NOTE — ED Provider Notes (Signed)
CSN: [id number]     Arrival date & time 03/15/13  2313 History   First MD Initiated Contact with Patient 03/16/13 0015     Chief Complaint  Patient presents with  . Foot Pain   (Consider location/radiation/quality/duration/timing/severity/associated sxs/prior Treatment) HPI Comments: 33 yo male with DM II, non compliant/ not on medicines presents with pins and needles in feet for the past few months, no weakness, rash or sores.  Pt does not follow with pcp.  No fevers or other sxs.  Pt feels okay otherwise.  He has no idea what his normal glu are because never checks.  Sxs intermittent.  No numbness or tingling in other areas, bilateral feet.   Patient is a 33 y.o. male presenting with lower extremity pain. The history is provided by the patient.  Foot Pain Pertinent negatives include no chest pain, no abdominal pain, no headaches and no shortness of breath.    Past Medical History  Diagnosis Date  . Diabetes mellitus    History reviewed. No pertinent past surgical history. History reviewed. No pertinent family history. History  Substance Use Topics  . Smoking status: Never Smoker   . Smokeless tobacco: Not on file  . Alcohol Use: Yes    Review of Systems  Constitutional: Negative for fever and chills.  HENT: Negative for congestion.   Eyes: Negative for visual disturbance.  Respiratory: Negative for shortness of breath.   Cardiovascular: Negative for chest pain.  Gastrointestinal: Negative for vomiting and abdominal pain.  Endocrine: Positive for polyuria.  Genitourinary: Negative for dysuria and flank pain.  Musculoskeletal: Negative for back pain, neck pain and neck stiffness.  Skin: Negative for rash.  Neurological: Positive for numbness. Negative for weakness, light-headedness and headaches.    Allergies  Review of patient's allergies indicates no known allergies.  Home Medications   Current Outpatient Rx  Name  Route  Sig  Dispense  Refill  . acetaminophen  (TYLENOL) 325 MG tablet   Oral   Take 2 tablets (650 mg total) by mouth every 6 (six) hours as needed (or Fever >/= 101).         [name] glipiZIDE (GLUCOTROL) 5 MG tablet   Oral   Take 1 tablet (5 mg total) by mouth 2 (two) times daily before a meal.   30 tablet   0   . ondansetron (ZOFRAN) 4 MG tablet   Oral   Take 1 tablet (4 mg total) by mouth every 6 (six) hours as needed for nausea.   20 tablet   0    BP 136/91  Pulse 97  Temp(Src) 97.7 F (36.5 C) (Oral)  Resp 18  SpO2 100% Physical Exam  Nursing note and vitals reviewed. Constitutional: He is oriented to person, place, and time. He appears well-developed and well-nourished.  HENT:  Head: Normocephalic and atraumatic.  Eyes: Conjunctivae are normal. Right eye exhibits no discharge. Left eye exhibits no discharge.  Neck: Normal range of motion. Neck supple. No tracheal deviation present.  Cardiovascular: Normal rate and regular rhythm.   Pulmonary/Chest: Effort normal and breath sounds normal.  Abdominal: Soft. He exhibits no distension. There is no tenderness. There is no guarding.  Musculoskeletal: He exhibits no edema and no tenderness.  Neurological: He is alert and oriented to person, place, and time. He has normal strength. He exhibits normal muscle tone.  Nl LE strength/ sensation  Skin: Skin is warm. No rash noted.  Feet mild dry skin, no open sores/ erythema/ warmth or streaking, full  rom of feet bilateral, sensation intact bilateral, strength NL in LE  Psychiatric: He has a normal mood and affect.    ED Course  Procedures (including critical care time) Labs Review Labs Reviewed  GLUCOSE, CAPILLARY - Abnormal; Notable for the following:    Glucose-Capillary 415 (*)    All other components within normal limits  BASIC METABOLIC PANEL - Abnormal; Notable for the following:    Sodium 136 (*)    Glucose, Bld 393 (*)    All other components within normal limits  URINALYSIS, ROUTINE W REFLEX MICROSCOPIC -  Abnormal; Notable for the following:    Specific Gravity, Urine 1.038 (*)    Glucose, UA >1000 (*)    All other components within normal limits  URINE MICROSCOPIC-ADD ON   Imaging Review No results found.  EKG Interpretation   None       MDM   1. Noncompliance with medication regimen   2. Diabetes mellitus type 2, uncontrolled   3. Peripheral neuropathy   4. Numbness and tingling of foot    Clinically periph neuropathy secondary to uncontrolled DM.  Non compliance, long discussion with pt, discussed r/ b of poor glu control, he understands it can cause heart, nerve, kidney, brain, et cetera damage which can lead to strokes/ MI/ ... Blood work/ fluids and outpt fup.  Will start metformin.  Glucose elevated, fluids given.  No signs or lab work of acidosis.   Close fup stressed, script for metformin.  No ketosis on UA.  No signs of infection.  Results and differential diagnosis were discussed with the patient. Close follow up outpatient was discussed, patient comfortable with the plan.   Diagnosis: above  Enid SkeensJoshua M Jakobie Henslee, MD 03/16/13 [phone]

## 2013-03-16 NOTE — Discharge Instructions (Signed)
Return for weakness or spreading numbness up your legs. Please check glucose daily, take metformin and follow closely with a physician.  If you were given medicines take as directed.  If you are on coumadin or contraceptives realize their levels and effectiveness is altered by many different medicines.  If you have any reaction (rash, tongues swelling, other) to the medicines stop taking and see a physician.   Please follow up as directed and return to the ER or see a physician for new or worsening symptoms.  Thank you.

## 2013-03-19 ENCOUNTER — Emergency Department (HOSPITAL_COMMUNITY)
Admission: EM | Admit: 2013-03-19 | Discharge: 2013-03-20 | Disposition: A | Discharge status: Home / Self Care | Payer: Self-pay | Attending: Emergency Medicine | Admitting: Emergency Medicine

## 2013-03-19 DIAGNOSIS — R5381 Other malaise: Secondary | ICD-10-CM | POA: Insufficient documentation

## 2013-03-19 DIAGNOSIS — Z79899 Other long term (current) drug therapy: Secondary | ICD-10-CM | POA: Insufficient documentation

## 2013-03-19 DIAGNOSIS — IMO0001 Reserved for inherently not codable concepts without codable children: Secondary | ICD-10-CM | POA: Insufficient documentation

## 2013-03-19 DIAGNOSIS — T383X5A Adverse effect of insulin and oral hypoglycemic [antidiabetic] drugs, initial encounter: Secondary | ICD-10-CM | POA: Insufficient documentation

## 2013-03-19 DIAGNOSIS — E119 Type 2 diabetes mellitus without complications: Secondary | ICD-10-CM | POA: Insufficient documentation

## 2013-03-19 DIAGNOSIS — R5383 Other fatigue: Principal | ICD-10-CM

## 2013-03-19 DIAGNOSIS — T50905A Adverse effect of unspecified drugs, medicaments and biological substances, initial encounter: Secondary | ICD-10-CM

## 2013-03-20 ENCOUNTER — Encounter (HOSPITAL_COMMUNITY): Payer: Self-pay | Admitting: Emergency Medicine

## 2013-03-20 LAB — GLUCOSE, CAPILLARY: GLUCOSE-CAPILLARY: 270 mg/dL — AB (ref 70–99)

## 2013-03-20 MED ORDER — GLIPIZIDE 5 MG PO TABS: 10.0000 mg | ORAL_TABLET | Freq: Two times a day (BID) | ORAL | Status: DC

## 2013-03-20 NOTE — Discharge Instructions (Signed)
Stop taking the Metformin   Start taking the Glipizide 5 milligram tablets in the morning [facility] or [facility] inexp[facility] You have also been given a referral the [facility]  Please call and establish regular medical care

## 2013-03-20 NOTE — ED Provider Notes (Signed)
CSN: [id number]     Arrival date & time 03/19/13  2332 History   First MD Initiated Contact with Patient 03/20/13 0215     Chief Complaint  Patient presents with  . Tingling    generalized body   (Consider location/radiation/quality/duration/timing/severity/associated sxs/prior Treatment) HPI Comments: Patient was recently started on metformin for his new diagnosis of diabetes.  Since starting the medication.  He has felt "terrible" every time.  He takes the tablet.  He gets extreme fatigue and weakness and generalized myalgias, to the point where he is unable to function, and is taken to his bed for the last 2, days.  Denies any nausea, vomiting, diarrhea, headache, shortness of breath, chest pain, peripheral edema.  The history is provided by the patient.    Past Medical History  Diagnosis Date  . Diabetes mellitus    History reviewed. No pertinent past surgical history. History reviewed. No pertinent family history. History  Substance Use Topics  . Smoking status: Never Smoker   . Smokeless tobacco: Not on file  . Alcohol Use: Yes    Review of Systems  Constitutional: Negative for fever and chills.  Respiratory: Negative for shortness of breath.   Cardiovascular: Negative for chest pain and leg swelling.  Musculoskeletal: Positive for myalgias. Negative for joint swelling.  Skin: Negative for rash and wound.  Neurological: Positive for weakness. Negative for dizziness and headaches.  All other systems reviewed and are negative.    Allergies  Review of patient's allergies indicates no known allergies.  Home Medications   Current Outpatient Rx  Name  Route  Sig  Dispense  Refill  . Ibuprofen-Diphenhydramine HCl (ADVIL PM) 200-25 MG CAPS   Oral   Take 2 tablets by mouth at bedtime as needed (pain).         [name] glipiZIDE (GLUCOTROL) 5 MG tablet   Oral   Take 2 tablets (10 mg total) by mouth 2 (two) times daily before a meal.   60 tablet   3    BP 122/77  Pulse  74  Temp(Src) 97.6 F (36.4 C) (Oral)  Resp 14  SpO2 100% Physical Exam  Nursing note and vitals reviewed. Constitutional: He is oriented to person, place, and time. He appears well-nourished.  HENT:  Head: Normocephalic.  Eyes: Pupils are equal, round, and reactive to light.  Neck: Normal range of motion.  Cardiovascular: Normal rate and regular rhythm.   Pulmonary/Chest: Effort normal and breath sounds normal.  Abdominal: Soft. Bowel sounds are normal.  Musculoskeletal: He exhibits no edema and no tenderness.  Neurological: He is alert and oriented to person, place, and time.  Skin: Skin is warm. No rash noted. No erythema.    ED Course  Procedures (including critical care time) Labs Review Labs Reviewed  GLUCOSE, CAPILLARY - Abnormal; Notable for the following:    Glucose-Capillary 270 (*)    All other components within normal limits   Imaging Review No results found.  EKG Interpretation   None       MDM   1. Idiosyncratic reaction to medication after proper dose    Will DC Metformin and start Glipizide 5 mg BID This was discussed with the patient who understands to stop teh former medication and start the new he understands that the side effects will decrease in several days.  He has bee instructed to call the [facility] to establish care     Arman Filter, NP 03/20/13 0336  Arman Filter, NP 03/20/13 [phone]  Arman FilterGail K Kenidee Cregan, NP 03/22/13 [phone]

## 2013-03-20 NOTE — ED Notes (Signed)
Patient is alert and oriented x3.  He is complaining of generalized tingling all over.  Patient is a diabetic and states that when he changed his medication he started feeling Tingling all over.  Currently he rates his pain 8 of 10.

## 2013-03-25 NOTE — ED Provider Notes (Signed)
Medical screening examination/treatment/procedure(s) were conducted as a shared visit with non-physician practitioner(s) and myself.  I personally evaluated the patient during the encounter.  EKG Interpretation   None       Will switch medications to glipizide  Lyanne CoKevin M Michaele Amundson, MD 03/25/13 1353

## 2013-04-19 ENCOUNTER — Ambulatory Visit: Payer: Self-pay

## 2013-06-07 ENCOUNTER — Encounter (HOSPITAL_COMMUNITY): Payer: Self-pay | Admitting: Emergency Medicine

## 2013-06-07 ENCOUNTER — Emergency Department (HOSPITAL_COMMUNITY)
Admission: EM | Admit: 2013-06-07 | Discharge: 2013-06-07 | Disposition: A | Discharge status: Home / Self Care | Payer: Self-pay | Attending: Emergency Medicine | Admitting: Emergency Medicine

## 2013-06-07 DIAGNOSIS — R739 Hyperglycemia, unspecified: Secondary | ICD-10-CM

## 2013-06-07 DIAGNOSIS — G629 Polyneuropathy, unspecified: Secondary | ICD-10-CM

## 2013-06-07 DIAGNOSIS — G5790 Unspecified mononeuropathy of unspecified lower limb: Secondary | ICD-10-CM | POA: Insufficient documentation

## 2013-06-07 DIAGNOSIS — E119 Type 2 diabetes mellitus without complications: Secondary | ICD-10-CM | POA: Insufficient documentation

## 2013-06-07 DIAGNOSIS — Z79899 Other long term (current) drug therapy: Secondary | ICD-10-CM | POA: Insufficient documentation

## 2013-06-07 LAB — CBG MONITORING, ED: GLUCOSE-CAPILLARY: 263 mg/dL — AB (ref 70–99)

## 2013-06-07 NOTE — Discharge Instructions (Signed)
High Blood Sugar High blood sugar (hyperglycemia) means that the level of sugar in your blood is higher than it should be. Signs of high blood sugar include:  Feeling thirsty.  Frequent peeing (urinating).  Feeling tired or sleepy.  Dry mouth.  Vision changes.  Feeling weak.  Feeling hungry but losing weight.  Numbness and tingling in your hands or feet.  Headache. When you ignore these signs, your blood sugar may keep going up. These problems may get worse, and other problems may begin. HOME CARE  Check your blood sugars as told by your doctor. Write down the numbers with the date and time.  Take the right amount of insulin or diabetes pills at the right time. Write down the dose with date and time.  Refill your insulin or diabetes pills before running out.  Watch what you eat. Follow your meal plan.  Drink liquids without sugar, such as water. Check with your doctor if you have kidney or heart disease.  Follow your doctor's orders for exercise. Exercise at the same time of day.  Keep your doctor's appointments. GET HELP RIGHT AWAY IF:   You have trouble thinking or are confused.  You have fast breathing with fruity smelling breath.  You pass out (faint).  You have 2 to 3 days of high blood sugars and you do not know why.  You have chest pain.  You are feeling sick to your stomach (nauseous) or throwing up (vomiting).  You have sudden vision changes. MAKE SURE YOU:   Understand these instructions.  Will watch your condition.  Will get help right away if you are not doing well or get worse. Document Released: 12/12/2008 Document Revised: 05/09/2011 Document Reviewed: 12/12/2008 [facility] Patient Information 2014 [place].   Emergency Department Resource Guide 1) Find a Doctor and Pay Out of Pocket Although you won't have to find out who is covered by your insurance plan, it is a good idea to ask around and get recommendations. You will then need  to call the office and see if the doctor you have chosen will accept you as a new patient and what types of options they offer for patients who are self-pay. Some doctors offer discounts or will set up payment plans for their patients who do not have insurance, but you will need to ask so you aren't surprised when you get to your appointment.  2) Contact Your Local Health Department Not all health departments have doctors that can see patients for sick visits, but many do, so it is worth a call to see if yours does. If you don't know where your local health department is, you can check in your phone book. The CDC also has a tool to help you locate your state's health department, and many state websites also have listings of all of their local health departments.  3) Find a Walk-in Clinic If your illness is not likely to be very severe or complicated, you may want to try a walk in clinic. These are popping up all over the country in pharmacies, drugstores, and shopping centers. They're usually staffed by nurse practitioners or physician assistants that have been trained to treat common illnesses and complaints. They're usually fairly quick and inexpensive. However, if you have serious medical issues or chronic medical problems, these are probably not your best option.  No Primary Care Doctor: - Call Health Connect at  [phone] - they can help you locate a primary care doctor that  accepts your insurance,  provides certain services, etc. - Physician Referral Service- [phone]  Chronic Pain Problems: Organization         Address  Phone   Notes  [facility]  [phone] Patients need to be referred by their primary care doctor.   Medication Assistance: Organization         Address  Phone   Notes  [facility] Medication [facility] [address] [place] [place], [place] [address] [phone] --Must be a resident of [facility] -- Must have NO insurance  coverage whatsoever (no Medicaid/ Medicare, etc.) -- The pt. MUST have a primary care doctor that directs their care regularly and follows them in the community   [facility]  [phone]   [employer]  [phone]    Agencies that provide inexpensive medical care: Organization         Address  Phone   Notes  [facility]  [phone]   [facility]    [phone]   [facility] [address] [place], [place] [address] [phone]   [facility] [address] [place] [phone]   Planned Parenthood    [phone]   [facility]    [phone]   [facility]  [address], [place] Phone:  [phone], Fax:  [phone] Hours of Operation:  9 am - 6 pm, M-F.  Also accepts Medicaid/Medicare and self-pay.  [facility]  [address], [place] Phone: [phone], Fax: [phone]. Hours of Operation:  8:30 am - 5:30 pm, M-F.  Also accepts Medicaid and self-pay.  [facility] [place] [address], [place] Phone: [phone]   [facility] [address] Natasha Bence [place], [place] [phone], Ext. [phone] Mondays & Thursdays: 7-9 AM.  First 15 patients are seen on a first come, first serve basis.    Medicaid-accepting [facility] Providers:  Organization         Address  Phone   Notes  [facility] [address], [place] [phone] Also accepts self-pay patients.  [facility] [address] Laurell Josephs [place], [place]  [phone]   [facility] [address], [place] [phone]   [facility] [address] [place] [phone]   Renaye Rakers [address], [place]   [phone] Only accepts [place] patients after they have their  name applied to their card.   Self-Pay (no insurance) in [facility]:  Organization         Address  Phone   Notes  Sickle Cell Patients, [facility] [address] [place], [place] [phone]   [facility] [address] [place], [place] [phone]   [facility]  [address], [place] [phone]   [facility]/Dr. Osei-Bonsu  [address] [place] or [address], [place] [phone] Phone number for both [place] and [place] locations is the same.  [facility] [address] [facility] [phone]   [facility] [address], [place] or [address] [phone]   [employer] [address], [place] [phone], phone; [phone], fax Sees patients 1st and 3rd Saturday of every month.  Must not qualify for public or private insurance (i.e. Medicaid, Medicare, [place] Health Choice, Veterans' Benefits)  Household income should be no more than 200% of the poverty level The clinic cannot treat you if you are pregnant or think you are pregnant  Sexually transmitted diseases are not treated at the clinic.    Dental Care: Organization         Address  Phone  Notes  [facility] Department of [facility] [address] [place], [place] [phone] Accepts children up to age 53 who are enrolled in [place] or [place] Health Choice; pregnant women with a Medicaid card; and children who have applied for Medicaid or [place] Health Choice, but were declined, whose parents can pay a reduced fee at time of service.  [facility] Department of [facility]  [address], [place] [phone] Accepts children up to age 18 who are enrolled in [place] or [place] Health Choice; pregnant women with a Medicaid card; and children who have applied for  Medicaid or [place] Health Choice, but were declined, whose parents can pay a reduced fee at time of service.  [facility]  [address] [place], [place] [phone] Patients are seen by appointment only. Walk-ins are not accepted. [facility] will see patients 69 years of age and older. Monday - Tuesday (8am-5pm) Most Wednesdays (8:30-5pm) $30 per visit, cash only  [facility] Adult Dental Access PRO[facility]  [address], [facility] [phone] Patients are seen by appointment only. Walk-ins are not accepted. [facility] will see patients 25 years of age and older. One Wednesday Evening (Monthly: Volunteer Based).  $30 per visit, cash only  [employer]  [phone] for adults; Children under age 106, call Graduate Pediatric Dentistry at [phone]. Children aged 65-14, please call [phone] to request a pediatric application.  Dental services are provided in all areas of dental care including fillings, crowns and bridges, complete and partial dentures, implants, gum treatment, root canals, and extractions. Preventive care is also provided. Treatment is provided to both adults and children. Patients are selected via a lottery and there is often a waiting list.   [facility] [address], [place]  [phone] [email or web address]   [facility] [address] [place], [place] [phone], Ext. [phone] Second and Fourth Thursday of each month, opens at 6:30 AM; Clinic ends at 9 AM.  Patients are seen on a first-come first-served basis, and a limited number are seen during each clinic.   [facility]  [address] Ether Griffins [place], [place] [phone]   Eligibility Requirements You must have lived in [place], [place], or [place] counties for at least the last three months.   You cannot be eligible for state or federal sponsored [place], including [employer], [place],  or [employer].   You generally cannot be eligible for healthcare insurance through your employer.    How to apply: Eligibility screenings are held every Tuesday and Wednesday afternoon from 1:00 pm until 4:00 pm. You do not need an appointment for the interview!  [facility] [address] [place], [place] [phone]   [facility] Health Department  [phone]   [facility] Health Department  [phone]   [facility] Health Department  [phone]  Behavioral Health Resources in the Community: Intensive Outpatient Programs Organization         Address  Phone  Notes  [facility] [address], [place], [place] [phone]   [facility] [address], [place], [place] [phone]   [facility] [address], [place], [place]  [phone]   [facility] [address],  [place], [place] [phone] or [phone]   Substance Abuse Resources Organization         Address  Phone  Notes  [facility]  [phone]   [facility]  [phone]   [facility]  [phone]   [facility]  [phone]   Residential & Outpatient Substance Abuse Program  [phone]   Psychological Services Organization         Address  Phone  Notes  [facility]  [phone]- [phone]   [facility]  [phone]- [phone]   [facility] [address], [place] [phone] or [phone]    Mobile Crisis Teams Organization         Address  Phone  Notes  [facility], Mobile Crisis Care Unit  [phone]   [facility]  [address] [place], [place] [phone]   Doristine LocksSharon DeEsch [address] [place] [phone]    Self-Help/Support Groups Organization         Address  Phone             Notes  [facility] [place] - variety of support groups   336- [phone] Call for more information  Narcotics Anonymous (NA), [facility] [address], [place]  2 meetings at this location   Statisticianesidential Treatment Programs Organization         Address  Phone  Notes  [facility] [address],    [place]  [phone]   [facility]  [address], [place] [phone], [place], [place] [phone]   [facility] [address], [place] [phone] Admissions: 8am-3pm M-F  [facility] [address]    [place], [place] [phone]   [facility] [address], [place], [place] [phone]   [facility] [address]  [place], [place] [phone]   [facility] - Intensive Outpatient [address], [place], [place] [phone]   [facility] [address]  [place], [place] [phone] or [phone]   [facility] ([facility]) [address] [place], [place] [phone] Accepts Medicaid  [facility] [address]  [place] [phone] Substance Abuse/Addiction Treatment   [facility] Resources Organization         Address  Phone  Notes  [facility]  [phone]) [phone]   Angie FavaJulie Brannon, PhD [address] [place], [place]   [phone] or [phone]   [facility]   [address] [place], [place] [phone]   [facility] [address], [place], [place] [phone] Insurance/Medicaid/sponsorship through [facility]  [facility] [address]                                    [place], [place] [phone] Therapy/tele-psych/case  [facility] [address]   [place],  [place] [phone]    Dr. Lolly Mustache  [phone]   [facility] [place]  [employer] [facility] Dept. 1) [address], [place] 2) [address], [place] 3)  [address], [place] [phone]  [phone]   [facility] Child Abuse Hotline 2311815074 or [phone] (After Hours)      It is very important that you find a primary care physician to manage her diabetes and help you with diet choices.  They can manage their other medical complaints, as well.  You have been given a resource list as this to establish primary care

## 2013-06-07 NOTE — ED Notes (Signed)
Pt states that for a month he has been having bilateral foot pain and [facility], but medications he was given did not help

## 2013-06-07 NOTE — ED Notes (Signed)
NP at bedside.

## 2013-06-07 NOTE — ED Provider Notes (Signed)
CSN: [id number]     Arrival date & time 06/07/13  0302 History   First MD Initiated Contact with Patient 06/07/13 0408     Chief Complaint  Patient presents with  . Foot Pain     (Consider location/radiation/quality/duration/timing/severity/associated sxs/prior Treatment) HPI Comments: Patient has been a non-insulin-dependent diabetic, for several years, but remains ignorant of his care.  He intermittently takes his medications.  He does not eat correctly presents to the emergency department, with continued pains and needling sensation to his feet.  He has been informed previously that he needs to find a primary care physician to help him manage his diabetes as well as obtain dietary instruction. Tonight.  He has no new complaints.  Just, bilateral foot pain  Patient is a 33 y.o. male presenting with lower extremity pain. The history is provided by the patient.  Foot Pain This is a chronic problem. The current episode started more than 1 year ago. The problem occurs constantly. The problem has been unchanged. Pertinent negatives include no chest pain, coughing, fever, headaches, numbness, rash or weakness. The symptoms are aggravated by walking. He has tried nothing for the symptoms. The treatment provided no relief.    Past Medical History  Diagnosis Date  . Diabetes mellitus    History reviewed. No pertinent past surgical history. No family history on file. History  Substance Use Topics  . Smoking status: Never Smoker   . Smokeless tobacco: Not on file  . Alcohol Use: Yes    Review of Systems  Constitutional: Negative for fever.  Respiratory: Negative for cough and shortness of breath.   Cardiovascular: Negative for chest pain.  Skin: Negative for rash and wound.  Neurological: Negative for dizziness, weakness, numbness and headaches.  All other systems reviewed and are negative.     Allergies  Review of patient's allergies indicates no known allergies.  Home  Medications   Current Outpatient Rx  Name  Route  Sig  Dispense  Refill  . glipiZIDE (GLUCOTROL) 5 MG tablet   Oral   Take 2 tablets (10 mg total) by mouth 2 (two) times daily before a meal.   60 tablet   3    BP 131/94  Pulse 85  Temp(Src) 98.5 F (36.9 C) (Oral)  Resp 14  Ht 6\' 6"  (1.981 m)  Wt 222 lb 8 oz (100.925 kg)  BMI 25.72 kg/m2  SpO2 98% Physical Exam  Nursing note and vitals reviewed. Constitutional: He appears well-developed and well-nourished.  HENT:  Head: Normocephalic.  Neck: Normal range of motion.  Cardiovascular: Normal rate and regular rhythm.   Pulmonary/Chest: Effort normal and breath sounds normal.  Abdominal: Soft.  Musculoskeletal: Normal range of motion. He exhibits no edema and no tenderness.  Neurological: He is alert.  Skin: Skin is warm. No rash noted. No erythema.    ED Course  Procedures (including critical care time) Labs Review Labs Reviewed  CBG MONITORING, ED - Abnormal; Notable for the following:    Glucose-Capillary 263 (*)    All other components within normal limits   Imaging Review No results found.   EKG Interpretation None      MDM  Patient has been encouraged to eat properly, to take his medication on a regular basis to find a primary care physician in the community.  He is again, been given a resource list to help him in this endeavor.  He's also been given dietary guidelines. Final diagnoses:  Peripheral neuropathy  Hyperglycemia  Arman FilterGail K Ciearra Rufo, NP 06/07/13 0443  Arman FilterGail K Aleyna Cueva, NP 06/07/13 [phone]

## 2013-06-07 NOTE — ED Provider Notes (Signed)
Medical screening examination/treatment/procedure(s) were performed by non-physician practitioner and as supervising physician I was immediately available for consultation/collaboration.    Olivia Mackielga M Travon Crochet, MD 06/07/13 [phone]

## 2013-06-07 NOTE — ED Notes (Signed)
Pt reports burning sensation in both of his feet, pt states this has been ongoing for about 3 months, pt reports his feet get really hot and the sensation travels up to his thighs.

## 2013-09-25 ENCOUNTER — Emergency Department (HOSPITAL_COMMUNITY)
Admission: EM | Admit: 2013-09-25 | Discharge: 2013-09-25 | Disposition: A | Discharge status: Home / Self Care | Payer: Self-pay | Attending: Emergency Medicine | Admitting: Emergency Medicine

## 2013-09-25 ENCOUNTER — Encounter (HOSPITAL_COMMUNITY): Payer: Self-pay | Admitting: Emergency Medicine

## 2013-09-25 DIAGNOSIS — R350 Frequency of micturition: Secondary | ICD-10-CM | POA: Insufficient documentation

## 2013-09-25 DIAGNOSIS — Z79899 Other long term (current) drug therapy: Secondary | ICD-10-CM | POA: Insufficient documentation

## 2013-09-25 DIAGNOSIS — R739 Hyperglycemia, unspecified: Secondary | ICD-10-CM

## 2013-09-25 DIAGNOSIS — E119 Type 2 diabetes mellitus without complications: Secondary | ICD-10-CM | POA: Insufficient documentation

## 2013-09-25 LAB — CBC WITH DIFFERENTIAL/PLATELET
BASOS ABS: 0 10*3/uL (ref 0.0–0.1)
BASOS PCT: 0 % (ref 0–1)
EOS ABS: 0.1 10*3/uL (ref 0.0–0.7)
Eosinophils Relative: 1 % (ref 0–5)
HCT: 46.9 % (ref 39.0–52.0)
Hemoglobin: 15.9 g/dL (ref 13.0–17.0)
Lymphocytes Relative: 51 % — ABNORMAL HIGH (ref 12–46)
Lymphs Abs: 2.9 10*3/uL (ref 0.7–4.0)
MCH: 26.8 pg (ref 26.0–34.0)
MCHC: 33.9 g/dL (ref 30.0–36.0)
MCV: 79.1 fL (ref 78.0–100.0)
Monocytes Absolute: 0.5 10*3/uL (ref 0.1–1.0)
Monocytes Relative: 9 % (ref 3–12)
NEUTROS ABS: 2.2 10*3/uL (ref 1.7–7.7)
NEUTROS PCT: 39 % — AB (ref 43–77)
PLATELETS: 169 10*3/uL (ref 150–400)
RBC: 5.93 MIL/uL — ABNORMAL HIGH (ref 4.22–5.81)
RDW: 12.7 % (ref 11.5–15.5)
WBC: 5.7 10*3/uL (ref 4.0–10.5)

## 2013-09-25 LAB — CBG MONITORING, ED: Glucose-Capillary: 355 mg/dL — ABNORMAL HIGH (ref 70–99)

## 2013-09-25 LAB — I-STAT CHEM 8, ED
BUN: 9 mg/dL (ref 6–23)
CALCIUM ION: 1.33 mmol/L — AB (ref 1.12–1.23)
CREATININE: 1 mg/dL (ref 0.50–1.35)
Chloride: 94 mEq/L — ABNORMAL LOW (ref 96–112)
Glucose, Bld: 573 mg/dL (ref 70–99)
HCT: 52 % (ref 39.0–52.0)
Hemoglobin: 17.7 g/dL — ABNORMAL HIGH (ref 13.0–17.0)
Potassium: 4.4 mEq/L (ref 3.7–5.3)
Sodium: 133 mEq/L — ABNORMAL LOW (ref 137–147)
TCO2: 31 mmol/L (ref 0–100)

## 2013-09-25 LAB — URINALYSIS, ROUTINE W REFLEX MICROSCOPIC
BILIRUBIN URINE: NEGATIVE
Hgb urine dipstick: NEGATIVE
KETONES UR: NEGATIVE mg/dL
LEUKOCYTES UA: NEGATIVE
Nitrite: NEGATIVE
PH: 5.5 (ref 5.0–8.0)
Protein, ur: NEGATIVE mg/dL
SPECIFIC GRAVITY, URINE: 1.038 — AB (ref 1.005–1.030)
Urobilinogen, UA: 0.2 mg/dL (ref 0.0–1.0)

## 2013-09-25 LAB — URINE MICROSCOPIC-ADD ON
RBC / HPF: NONE SEEN RBC/hpf (ref <3)
WBC UA: NONE SEEN WBC/hpf (ref <3)

## 2013-09-25 MED ORDER — METFORMIN HCL 1000 MG PO TABS: 1000.0000 mg | ORAL_TABLET | Freq: Two times a day (BID) | ORAL | Status: DC

## 2013-09-25 MED ORDER — METFORMIN HCL 500 MG PO TABS
1000.0000 mg | ORAL_TABLET | Freq: Once | ORAL | Status: AC
  Administered 2013-09-25: 1000 mg via ORAL
  Filled 2013-09-25: qty 2

## 2013-09-25 MED ORDER — SODIUM CHLORIDE 0.9 % IV BOLUS (SEPSIS)
1000.0000 mL | Freq: Once | INTRAVENOUS | Status: AC
  Administered 2013-09-25: 1000 mL via INTRAVENOUS

## 2013-09-25 NOTE — ED Provider Notes (Signed)
CSN: [id number]     Arrival date & time 09/25/13  0344 History   First MD Initiated Contact with Patient 09/25/13 0421     Chief Complaint  Patient presents with  . Urinary Frequency  . Weakness     (Consider location/radiation/quality/duration/timing/severity/associated sxs/prior Treatment) HPI Comments: Patient with a history of DM presents today with a chief complaint of increased urinary frequency.  He reports that this has been present for the past 2 weeks and is gradually worsening.  He denies dysuria, urgency, fever, chills, nausea, vomiting, or abdominal pain.  He reports he has been taking his Diabetes medications.  However, he is unsure what medications he is on.  Review of the chart shows Glucotrol.   However, the last Rx that he was given in the ED three months ago would have run out by now.  He reports that he has not seen a PCP since being seen in the ED three months ago.  However, he does report that he has an upcoming appointment with a new PCP in 3 weeks.  He has not been checking his blood sugars at home.    The history is provided by the patient.    Past Medical History  Diagnosis Date  . Diabetes mellitus    History reviewed. No pertinent past surgical history. History reviewed. No pertinent family history. History  Substance Use Topics  . Smoking status: Never Smoker   . Smokeless tobacco: Not on file  . Alcohol Use: Yes    Review of Systems  All other systems reviewed and are negative.     Allergies  Review of patient's allergies indicates no known allergies.  Home Medications   Prior to Admission medications   Medication Sig Start Date End Date Taking? Authorizing Provider  glipiZIDE (GLUCOTROL) 5 MG tablet Take 2 tablets (10 mg total) by mouth 2 (two) times daily before a meal. 03/20/13   Arman FilterGail K Schulz, NP   BP 121/77  Pulse 99  Temp(Src) 98.1 F (36.7 C) (Oral)  Resp 18  Ht 6\' 7"  (2.007 m)  Wt 216 lb 4.8 oz (98.113 kg)  BMI 24.36 kg/m2  SpO2  100% Physical Exam  Nursing note and vitals reviewed. Constitutional: He appears well-developed and well-nourished.  HENT:  Head: Normocephalic and atraumatic.  Mouth/Throat: Oropharynx is clear and moist.  Neck: Normal range of motion. Neck supple.  Cardiovascular: Normal rate, regular rhythm and normal heart sounds.   Pulmonary/Chest: Effort normal and breath sounds normal.  Abdominal: Soft. Bowel sounds are normal. He exhibits no distension and no mass. There is no tenderness. There is no rebound and no guarding.  Musculoskeletal: Normal range of motion.  Neurological: He is alert.  Skin: Skin is warm and dry.  Psychiatric: He has a normal mood and affect.    ED Course  Procedures (including critical care time) Labs Review Labs Reviewed  I-STAT CHEM 8, ED - Abnormal; Notable for the following:    Sodium 133 (*)    Chloride 94 (*)    Glucose, Bld 573 (*)    Calcium, Ion 1.33 (*)    Hemoglobin 17.7 (*)    All other components within normal limits  CBC WITH DIFFERENTIAL  URINALYSIS, ROUTINE W REFLEX MICROSCOPIC    Imaging Review No results found.   EKG Interpretation None     6:00 AM Patient [facility] Sciacca, PA-C at shift change.  UA pending.  Patient receiving IVF.   MDM   Final diagnoses:  None  Patient presenting with increased urinary frequency and hyperglycemia.  Patient found to have an elevated blood sugar of 573.  Anion gap of 9.  Suspect medication non compliance.  Labs otherwise unremarkable.  No nausea, vomiting, or abdominal pain.  UA pending.  Patient given Metformin and IVF in the ED.  Plan is for the patient to be discharged home once his blood sugar has decreased and UA has resulted.     Santiago Glad, PA-C 09/25/13 1441

## 2013-09-25 NOTE — Discharge Instructions (Signed)

## 2013-09-25 NOTE — ED Provider Notes (Signed)
Medical screening examination/treatment/procedure(s) were performed by non-physician practitioner and as supervising physician I was immediately available for consultation/collaboration.   EKG Interpretation None       Francine Hannan K Hermila Millis-Rasch, MD 09/25/13 2314 

## 2013-09-25 NOTE — ED Notes (Signed)
Patient states he hs been feeling weaa for several weeks and has been having urinary frequency

## 2014-01-05 ENCOUNTER — Emergency Department (HOSPITAL_COMMUNITY)
Admission: EM | Admit: 2014-01-05 | Discharge: 2014-01-06 | Disposition: A | Discharge status: Home / Self Care | Payer: Self-pay | Attending: Emergency Medicine | Admitting: Emergency Medicine

## 2014-01-05 ENCOUNTER — Encounter (HOSPITAL_COMMUNITY): Payer: Self-pay | Admitting: Emergency Medicine

## 2014-01-05 DIAGNOSIS — E1165 Type 2 diabetes mellitus with hyperglycemia: Secondary | ICD-10-CM | POA: Insufficient documentation

## 2014-01-05 DIAGNOSIS — R059 Cough, unspecified: Secondary | ICD-10-CM

## 2014-01-05 DIAGNOSIS — Z91148 Patient's other noncompliance with medication regimen for other reason: Secondary | ICD-10-CM

## 2014-01-05 DIAGNOSIS — E118 Type 2 diabetes mellitus with unspecified complications: Secondary | ICD-10-CM

## 2014-01-05 DIAGNOSIS — R739 Hyperglycemia, unspecified: Secondary | ICD-10-CM

## 2014-01-05 DIAGNOSIS — R05 Cough: Secondary | ICD-10-CM | POA: Insufficient documentation

## 2014-01-05 DIAGNOSIS — Z9114 Patient's other noncompliance with medication regimen: Secondary | ICD-10-CM | POA: Insufficient documentation

## 2014-01-05 DIAGNOSIS — Z79899 Other long term (current) drug therapy: Secondary | ICD-10-CM | POA: Insufficient documentation

## 2014-01-05 DIAGNOSIS — N529 Male erectile dysfunction, unspecified: Secondary | ICD-10-CM

## 2014-01-05 NOTE — ED Provider Notes (Signed)
CSN: [id number]     Arrival date & time 01/05/14  2335 History   First MD Initiated Contact with Patient 01/05/14 2342     Chief Complaint  Patient presents with  . Cough     (Consider location/radiation/quality/duration/timing/severity/associated sxs/prior Treatment) HPI  Jonathan [name] is a 33 y.o. male past medical history significant for non-insulin-dependent dependent diabetes,complaining of dry cough for one month. Patient also has diffuse myalgia. He denies fever, shortness of breath, focal chest pain, nausea, vomiting, change in bowel or bladder habits, increasing peripheral edema, recent travel, rash, history of DVT or PE, calf pain or leg swelling, Rhinorrhea, nasal congestion, history of allergies. Patient has been taking over-the-counter cough medication at home with little relief. States the coughing comes in fits and states that he chose to come in tonight because he was coughing for 2 hours while at a family function family members recommended he present to the ED.  She states he ran out of metformin 2 days ago, was recommended to follow with the wellness center however their soonest appointment with 6 months in advance so he never followed up. Patient has been compliant with his metformin but he states that he is unhappy with the side effects, states that when he started taking the metformin he started having erectile dysfunction, states that can have intercourse but he does not ejaculate. He gets his prescriptions out of the ED. He states that he has been compliant with his metformin because when he doesn't take it he has polyuria.  Past Medical History  Diagnosis Date  . Diabetes mellitus    History reviewed. No pertinent past surgical history. No family history on file. History  Substance Use Topics  . Smoking status: Never Smoker   . Smokeless tobacco: Not on file  . Alcohol Use: Yes    Review of Systems  10 systems reviewed and found to be negative, except as  noted in the HPI.   Allergies  Review of patient's allergies indicates no known allergies.  Home Medications   Prior to Admission medications   Medication Sig Start Date End Date Taking? Authorizing Provider  acetaminophen (TYLENOL) 500 MG tablet Take 1,000 mg by mouth every 6 (six) hours as needed for headache.   Yes Historical Provider, MD  Doxylamine Succinate, Sleep, (EQ SLEEP AID PO) Take 1-2 tablets by mouth at bedtime as needed (sleep).   Yes Historical Provider, MD  metFORMIN (GLUCOPHAGE) 1000 MG tablet Take 1 tablet (1,000 mg total) by mouth 2 (two) times daily. 01/06/14   Shuntae Herzig, PA-C   BP 139/81 mmHg  Pulse 88  Temp(Src) 97.6 F (36.4 C) (Oral)  Resp 21  Ht 6\' 8"  (2.032 m)  Wt 215 lb (97.523 kg)  BMI 23.62 kg/m2  SpO2 100% Physical Exam  Constitutional: He is oriented to person, place, and time. He appears well-developed and well-nourished. No distress.  HENT:  Head: Normocephalic and atraumatic.  Mouth/Throat: Oropharynx is clear and moist.  Eyes: Conjunctivae and EOM are normal. Pupils are equal, round, and reactive to light.  Neck: Normal range of motion. Neck supple. No JVD present.  Cardiovascular: Normal rate, regular rhythm and intact distal pulses.   Pulmonary/Chest: Effort normal and breath sounds normal. No stridor. No respiratory distress. He has no wheezes. He has no rales. He exhibits no tenderness.  Abdominal: Soft. Bowel sounds are normal.  Musculoskeletal: Normal range of motion. He exhibits no edema or tenderness.  No calf asymmetry, superficial collaterals, palpable cords, edema, Homans sign negative  bilaterally.    Neurological: He is alert and oriented to person, place, and time. No cranial nerve deficit. Coordination normal.  Psychiatric: He has a normal mood and affect.  Nursing note and vitals reviewed.   ED Course  Procedures (including critical care time) Labs Review Labs Reviewed  BASIC METABOLIC PANEL - Abnormal; Notable for  the following:    Sodium 136 (*)    Glucose, Bld 400 (*)    All other components within normal limits  CBC  HEMOGLOBIN A1C  BLOOD GAS, VENOUS  URINALYSIS, ROUTINE W REFLEX MICROSCOPIC    Imaging Review Dg Chest 2 View  01/06/2014   CLINICAL DATA:  Cough for 1 month with intermittent fever.  EXAM: CHEST  2 VIEW  COMPARISON:  PA and lateral chest 11/25/2010.  FINDINGS: The lungs are clear. Heart size is normal. No pneumothorax or pleural effusion. No focal bony abnormality.  IMPRESSION: No acute disease.  Stable compared to prior exam.   Electronically Signed   By: Drusilla Kannerhomas  Dalessio M.D.   On: 01/06/2014 00:16     EKG Interpretation None      MDM   Final diagnoses:  Cough  Hyperglycemia without ketosis  Non compliance w medication regimen  Type 2 diabetes mellitus with complication  Erectile dysfunction, unspecified erectile dysfunction type   Filed Vitals:   01/05/14 2345 01/05/14 2346 01/05/14 2357 01/06/14 0034  BP: 130/79 130/79  139/81  Pulse:  87 90 88  Temp:  97.6 F (36.4 C)    TempSrc:  Oral    Resp:  23 21   Height:  6\' 8"  (2.032 m)    Weight:  215 lb (97.523 kg)    SpO2:  100% 100% 100%    Medications  albuterol (PROVENTIL) (2.5 MG/3ML) 0.083% nebulizer solution 5 mg (not administered)  fluticasone (FLONASE) 50 MCG/ACT nasal spray 2 spray (not administered)  albuterol (PROVENTIL HFA;VENTOLIN HFA) 108 (90 BASE) MCG/ACT inhaler 2 puff (not administered)  insulin aspart (novoLOG) injection 6 Units (not administered)    Jonathan [name] is a 33 y.o. male presenting with Ry cough for a month associated with myalgia. Chest x-ray with no infiltrate, patient's blood sugar is found to be 400, he has a normal anion gap. He states he ran out of his metformin a couple days ago, states that he gets his medications through the ED, chart review shows that he was given a 30 day supply in July. Patient states he's been compliant with his metformin but he states that he is not  ejaculating since he started it several months ago. I have explained to him that this is likely retrograde ejaculation from the uncontrolled blood sugars rather than a side effect of the metformin. Stressed the importance of following up with primary care at the wellness Center. I will draw an A1c to facilitate his workup as an outpatient. I doubt that this patient is in DKA however will check a VBG and UA. Patient signed out to PA Kirinchenko at shift change.      Wynetta Emeryicole Gladie Gravette, PA-C 01/06/14 [id number]  Olivia Mackielga M Otter, MD 01/06/14 [phone]

## 2014-01-05 NOTE — ED Notes (Signed)
Pt. reports persistent dry cough for 1 1/2 months with intermittent body aches/ legs pain and generalized weakness . Denies fever or chills. Respirations unlabored .

## 2014-01-06 ENCOUNTER — Emergency Department (HOSPITAL_COMMUNITY): Payer: Self-pay

## 2014-01-06 ENCOUNTER — Encounter (HOSPITAL_COMMUNITY): Payer: Self-pay | Admitting: *Deleted

## 2014-01-06 LAB — CBC
HCT: 44.7 % (ref 39.0–52.0)
HEMOGLOBIN: 14.9 g/dL (ref 13.0–17.0)
MCH: 26.4 pg (ref 26.0–34.0)
MCHC: 33.3 g/dL (ref 30.0–36.0)
MCV: 79.1 fL (ref 78.0–100.0)
Platelets: 163 10*3/uL (ref 150–400)
RBC: 5.65 MIL/uL (ref 4.22–5.81)
RDW: 12.5 % (ref 11.5–15.5)
WBC: 4.9 10*3/uL (ref 4.0–10.5)

## 2014-01-06 LAB — BASIC METABOLIC PANEL
Anion gap: 12 (ref 5–15)
BUN: 13 mg/dL (ref 6–23)
CALCIUM: 9.3 mg/dL (ref 8.4–10.5)
CO2: 25 meq/L (ref 19–32)
Chloride: 99 mEq/L (ref 96–112)
Creatinine, Ser: 0.91 mg/dL (ref 0.50–1.35)
GFR calc Af Amer: >90 mL/min (ref >90)
GFR calc non Af Amer: >90 mL/min (ref >90)
GLUCOSE: 400 mg/dL — AB (ref 70–99)
POTASSIUM: 4.3 meq/L (ref 3.7–5.3)
Sodium: 136 mEq/L — ABNORMAL LOW (ref 137–147)

## 2014-01-06 LAB — URINALYSIS, ROUTINE W REFLEX MICROSCOPIC
Bilirubin Urine: NEGATIVE
Glucose, UA: >1000 mg/dL — AB
Hgb urine dipstick: NEGATIVE
Ketones, ur: NEGATIVE mg/dL
Leukocytes, UA: NEGATIVE
NITRITE: NEGATIVE
Protein, ur: NEGATIVE mg/dL
SPECIFIC GRAVITY, URINE: 1.01 (ref 1.005–1.030)
UROBILINOGEN UA: 0.2 mg/dL (ref 0.0–1.0)
pH: 6 (ref 5.0–8.0)

## 2014-01-06 LAB — I-STAT VENOUS BLOOD GAS, ED
Acid-Base Excess: 1 mmol/L (ref 0.0–2.0)
Bicarbonate: 25.5 mEq/L — ABNORMAL HIGH (ref 20.0–24.0)
O2 Saturation: 93 %
PCO2 VEN: 37.9 mmHg — AB (ref 45.0–50.0)
PH VEN: 7.436 — AB (ref 7.250–7.300)
TCO2: 27 mmol/L (ref 0–100)
pO2, Ven: 66 mmHg — ABNORMAL HIGH (ref 30.0–45.0)

## 2014-01-06 LAB — URINE MICROSCOPIC-ADD ON

## 2014-01-06 LAB — CBG MONITORING, ED: GLUCOSE-CAPILLARY: 285 mg/dL — AB (ref 70–99)

## 2014-01-06 LAB — HEMOGLOBIN A1C
HEMOGLOBIN A1C: 11.1 % — AB (ref <5.7)
MEAN PLASMA GLUCOSE: 272 mg/dL — AB (ref <117)

## 2014-01-06 MED ORDER — PREDNISONE 20 MG PO TABS: 60.0000 mg | ORAL_TABLET | Freq: Once | ORAL | Status: DC

## 2014-01-06 MED ORDER — ALBUTEROL SULFATE (2.5 MG/3ML) 0.083% IN NEBU
5.0000 mg | INHALATION_SOLUTION | Freq: Once | RESPIRATORY_TRACT | Status: AC
Start: 2014-01-06 — End: 2014-01-06
  Administered 2014-01-06: 5 mg via RESPIRATORY_TRACT
  Filled 2014-01-06: qty 6

## 2014-01-06 MED ORDER — SODIUM CHLORIDE 0.9 % IV BOLUS (SEPSIS)
1000.0000 mL | Freq: Once | INTRAVENOUS | Status: AC
  Administered 2014-01-06: 1000 mL via INTRAVENOUS

## 2014-01-06 MED ORDER — METFORMIN HCL 1000 MG PO TABS: 1000.0000 mg | ORAL_TABLET | Freq: Two times a day (BID) | ORAL | Status: DC

## 2014-01-06 MED ORDER — INSULIN ASPART 100 UNIT/ML ~~LOC~~ SOLN
6.0000 [IU] | Freq: Once | SUBCUTANEOUS | Status: AC
  Administered 2014-01-06: 6 [IU] via SUBCUTANEOUS
  Filled 2014-01-06: qty 1

## 2014-01-06 MED ORDER — ALBUTEROL SULFATE HFA 108 (90 BASE) MCG/ACT IN AERS
2.0000 | INHALATION_SPRAY | Freq: Once | RESPIRATORY_TRACT | Status: AC
  Administered 2014-01-06: 2 via RESPIRATORY_TRACT
  Filled 2014-01-06: qty 6.7

## 2014-01-06 MED ORDER — FLUTICASONE PROPIONATE 50 MCG/ACT NA SUSP
2.0000 | Freq: Every day | NASAL | Status: DC
  Administered 2014-01-06: 2 via NASAL
  Filled 2014-01-06: qty 16

## 2014-01-06 NOTE — ED Notes (Signed)
C/o dry cough for 1.5 months, no lasting benefit from cough med, also generalized body aches. (denies: fever, sob, nvd, dizziness, congestion, cold sx or other sx), has tried dayquil/ nyquil and tylenol, reports temporary relief of aches with tylenol. Alert, NAD, calm, interactive, no coughing noted, LS CTA.

## 2014-01-06 NOTE — ED Notes (Signed)
[facility] received and initiated, pt alert, NAD, calm, interactive.

## 2014-01-06 NOTE — Discharge Instructions (Signed)
Do not hesitate to return to the Emergency Department for any new, worsening or concerning symptoms.   If you do not have a primary care doctor you can establish one at the   [facility]: [address] [place] [id number] [phone]  After you establish care. Let them know you were seen in the emergency room. They must obtain records for further management.     Diseases and Conditions Retrograde ejaculation  By [facility] Staff Retrograde ejaculation occurs when semen enters the bladder instead of emerging through the penis during orgasm. Although you still reach sexual climax, you may ejaculate very little or no semen. This is sometimes called a dry orgasm. Retrograde ejaculation isn't harmful, but it can cause male infertility. Treatment for retrograde ejaculation is generally only needed to restore fertility.  Retrograde ejaculation doesn't affect your ability to get an erection or have an orgasm -- but when you climax, semen goes into your bladder instead of coming out of your penis. Retrograde ejaculation signs and symptoms include:  Dry orgasms, orgasms in which you ejaculate very little or no semen out of your penis Urine that is cloudy after orgasm (because it contains semen) Inability to get a woman pregnant (male infertility) When to see a doctor  Retrograde ejaculation isn't harmful and requires treatment only if you're attempting to father a child. However, if you have dry orgasms, see your doctor to be sure your condition isn't caused by an underlying problem that needs attention.  If you and your partner have had regular, unprotected intercourse for a year or longer and have been unable to conceive, see your doctor. Retrograde ejaculation may be the cause of your problem if you ejaculate very little or no semen.  During a male orgasm, a tube called the vas deferens transports sperm to the prostate, where they mix with other fluids to produce liquid  semen (ejaculate). The muscle at the opening of the bladder (bladder neck muscle) tightens to prevent ejaculate from entering the bladder as it passes from the prostate into the tube inside the penis (urethra). This is the same muscle that holds urine in your bladder until you urinate. With retrograde ejaculation, the bladder neck muscle doesn't tighten properly. As a result, sperm can enter the bladder instead of being ejected out of your body through the penis.  Several conditions can cause problems with the muscle that closes the bladder during ejaculation. These include:  Surgery, such as bladder neck surgery or prostate surgery Side effect of certain medications used to treat high blood pressure, prostate enlargement and mood disorders Nerve damage caused by a medical condition, such as diabetes, multiple sclerosis or a spinal cord injury A dry orgasm is the primary sign of retrograde ejaculation. But dry orgasm -- the ejaculation of little or no semen -- can also be caused by other conditions, including:  Surgical removal of the prostate (prostatectomy) Surgical removal of the bladder (cystectomy) Radiation therapy to treat cancer in the pelvic area You're at increased risk of retrograde ejaculation if:  You have diabetes or multiple sclerosis You've had prostate or bladder surgery You take certain drugs for high blood pressure or a mood disorder You had a spinal cord injury Retrograde ejaculation isn't harmful. However, potential complications include:  Inability to get a woman pregnant (male infertility) Less pleasurable orgasm due to worries about absent ejaculate You're likely to start by seeing your family doctor. Depending on the likely cause of your dry orgasms and whether you  need evaluation and treatment to help you get your male partner pregnant, you may need to see a urinary and reproductive specialist (urologist).  Because appointments can be brief, and because there's  often a lot of ground to cover, it's a good idea to be well prepared for your appointment. Here's some information to help you get ready for your appointment, and what to expect from your doctor.  What you can do  Write down any symptoms you're experiencing, including any that may seem unrelated to the reason for which you scheduled the appointment. Write down key personal information, including prior surgeries or pelvic radiation, any major stresses, or recent life changes. Make a list of all medications, vitamins and supplements that you're taking. Write down questions to ask your doctor. Your time with your doctor is limited, so preparing a list of questions in advance will help you make the most of your visit. List your questions from most important to least important.  When seeing your doctor for dry ejaculation -- the primary sign of retrograde ejaculation -- some basic questions to ask your doctor include:  What is likely causing my symptoms or condition? Are there other possible causes for my symptoms or condition? What kinds of tests do I need? Is my condition likely temporary or chronic? Am I at risk of complications from this condition? Does my condition need to be treated? Will I be able to conceive children? Should I see a specialist? Is there a generic alternative to the medicine you're prescribing me? Are there any brochures or other printed material that I can take home with me? What websites do you recommend visiting? If you are trying to get your male partner pregnant, you may also want to ask:  Will medications help me ejaculate normally? Can sperm be retrieved from my bladder and used for fertility treatment? Will my partner and I likely need to use assisted reproductive technology, such as intrauterine insemination, to achieve pregnancy? What's the best treatment to use to try and get my partner pregnant? In addition to the questions that you've prepared to ask your  doctor, don't hesitate to ask questions during your appointment.  What to expect from your doctor  Your doctor will ask you questions about your health and symptoms and may do a physical examination that includes examining your penis, testicles and rectum. Your doctor will want to determine whether your dry orgasms are retrograde ejaculation or linked to another problem that may need further evaluation.  Being ready to answer your doctor's questions may save time to go over any points you want to spend more time on. Your doctor may ask:  Do you have cloudy urine after an orgasm? When did you first begin having dry orgasms? Do you ever ejaculate semen when you have an orgasm, or do you have a dry orgasm every time? What surgeries have you had? Have you had cancer? Do you have diabetes or any other chronic health problems? What medications or herbal remedies do you take? Do you and your partner want to have a baby? If so, how long have you been trying to conceive? When you see the doctor, he or she will probably:  Ask you a number of questions about your symptoms and how long you've had them. Your doctor may also ask about any health problems, surgeries or cancers you've had and what medications you take. Do a physical examination, which will likely include an exam of your penis, testicles and rectum. Examine your  urine for the presence of semen after you have an orgasm. This procedure is usually done at the doctor's office. Your doctor will ask you to empty your bladder, masturbate to climax, and then provide a urine sample for laboratory analysis. If a high volume of sperm is found in your urine, you have retrograde ejaculation. If you have dry orgasms, but your doctor doesn't find semen in your bladder, you may have a problem with semen production. This can be caused by damage to the prostate or semen-producing glands as a result of surgery or radiation treatment for cancer in the pelvic  area.  If your doctor suspects your dry orgasm is something other than retrograde ejaculation, you may need further tests or a referral to a specialist to find the cause.  Retrograde ejaculation typically doesn't require treatment unless it interferes with fertility. In such cases, treatment depends on the underlying cause. Drugs may work for retrograde ejaculation caused by nerve damage. This can be caused by diabetes, multiple sclerosis, certain surgeries, and other conditions and treatments.  Drugs generally won't help if retrograde ejaculation is due to surgery that causes permanent physical changes of your anatomy. Examples include bladder neck surgery and transurethral resection of the prostate.  If your doctor thinks drugs you are taking may be affecting your ability to ejaculate normally, he or she may have you stop taking them for a period of time. Drugs that can cause retrograde ejaculation include certain medications for mood disorders and alpha blockers -- drugs used to treat high blood pressure and some prostate conditions.  Drugs to treat retrograde ejaculation are drugs primarily used to treat other conditions. They include:  Imipramine (Tofranil) Chlorpheniramine and brompheniramine Ephedrine, pseudoephedrine and phenylephrine These medications help keep the bladder neck muscle closed during ejaculation. While they're often an effective treatment for retrograde ejaculation, all of these medications can cause side effects. Some of the side effects are minor, but others can be more serious:  Some medications used to treat retrograde ejaculation can cause serious reactions when combined with other medications. Certain medications used to treat retrograde ejaculation can increase your blood pressure and heart rate, which can be dangerous if you have high blood pressure or heart disease. Infertility  If you have retrograde ejaculation, you'll likely need treatment to get your partner  pregnant. In order to achieve a pregnancy, you need to ejaculate enough semen to carry your sperm into your partner's vagina and into her uterus.  If medication doesn't allow you to ejaculate semen, you will likely need infertility procedures known as assisted reproductive technology to get your partner pregnant. In some cases, sperm can be recovered from the bladder, processed in the laboratory and used to inseminate your partner (intrauterine insemination). Occasionally, more-advanced assisted reproductive techniques may be needed. Many men with retrograde ejaculation are able to get their partners pregnant once they seek treatment.  Alterations in orgasm are associated with reductions in emotional and physical satisfaction, which in turn may lead to stress for you and your partner. Retrograde ejaculation can be especially challenging if you and your partner want to conceive a child. While most men can get their male partners pregnant with infertility treatment, it can be costly and require stressful medical procedures for both you and your partner. Talking with a counselor may help.  Understanding all of your options and communicating with your doctor and partner can help.  Know what it will cost. Your insurance may or may not cover the costs necessary for sperm  retrieval and artificial insemination of your partner. Talk to your doctor. Ask about all your options. You'll need to meet with a urologist who specializes in male infertility. Communicate with your partner. Make sure you and your partner both understand your options and potential risks of fertility procedures. You should both attend every consultation appointment. If you take medications or have health problems that put you at risk of retrograde ejaculation, ask your doctor what you can do to lower your risk.  If you need to have surgery that may affect the bladder neck muscle, such as prostate or bladder surgery, ask about the risk of  retrograde ejaculation. If you plan to have children in the future, talk with your doctor about options for preserving semen before the surgery.  References  Ferri FF. Clinical Advisor 2014. [place], [place] [employer]; 2014. http://www.clinicalkey.com. Accessed Aug. 9, 2013. Talmadge Coventry, et al. [facility] Urology. 10th ed. [place] [employer]; 2012. http://www.clinicalkey.com. Accessed Aug. 9, 2013. Ohl DA, et al. Anejaculation and retrograde ejaculation. [facility] [place] [facility]. 1610;96:045Lannette Donath A, et al. The management of retrograde ejaculation: a systematic review and update. Fertility and Sterility. 2012;97:306. Siassakos D, et al. Male infertility. American Urological Association. MusicalClubs.gl.cfm?article=102&display=1. Accessed Aug. 9, 2013. Barnas JL, et al. The prevalence and nature of orgasmic dysfunction after radical prostatectomy. [employer]. [id number]. Ten questions to ask before having an operation. [employer]. Gigun.si. Accessed Aug. 19, 2013. Speak up: Become a partner in your healthcare. [employer]. https://moreno.com/. Accessed Aug. 19, 2013. Dareen Piano CF (expert opinion). [facility], [place], [place]. Aug. 26, 2013. Jan. 30, 2014 Original article: [email or web address].nl

## 2014-01-06 NOTE — ED Notes (Signed)
CBG is 285

## 2014-03-15 ENCOUNTER — Encounter (HOSPITAL_COMMUNITY): Payer: Self-pay | Admitting: Emergency Medicine

## 2014-03-15 ENCOUNTER — Emergency Department (HOSPITAL_COMMUNITY)
Admission: EM | Admit: 2014-03-15 | Discharge: 2014-03-15 | Disposition: A | Discharge status: Home / Self Care | Payer: Self-pay | Attending: Emergency Medicine | Admitting: Emergency Medicine

## 2014-03-15 ENCOUNTER — Emergency Department (HOSPITAL_COMMUNITY): Payer: Self-pay

## 2014-03-15 DIAGNOSIS — R059 Cough, unspecified: Secondary | ICD-10-CM

## 2014-03-15 DIAGNOSIS — Z760 Encounter for issue of repeat prescription: Secondary | ICD-10-CM | POA: Insufficient documentation

## 2014-03-15 DIAGNOSIS — Z79899 Other long term (current) drug therapy: Secondary | ICD-10-CM | POA: Insufficient documentation

## 2014-03-15 DIAGNOSIS — R05 Cough: Secondary | ICD-10-CM | POA: Insufficient documentation

## 2014-03-15 DIAGNOSIS — E138 Other specified diabetes mellitus with unspecified complications: Secondary | ICD-10-CM | POA: Insufficient documentation

## 2014-03-15 MED ORDER — METFORMIN HCL 1000 MG PO TABS: 1000.0000 mg | ORAL_TABLET | Freq: Two times a day (BID) | ORAL | Status: DC

## 2014-03-15 NOTE — ED Provider Notes (Signed)
CSN: [id number]     Arrival date & time 03/15/14  0046 History  This chart was scribed for Jonathan [name] W Satish Hammers, MD by [place] Peace, ED Scribe. The patient was seen in A08C/A08C. The patient's care was started at 3:49 AM.     Chief Complaint  Patient presents with  . Cough  . Medication Refill      Patient is a 34 y.o. male presenting with cough. The history is provided by the patient. No language interpreter was used.  Cough Cough characteristics:  Productive Sputum characteristics:  Green Severity:  Severe Timing:  Constant Progression:  Worsening Chronicity:  Chronic Smoker: no   Associated symptoms: sore throat   Associated symptoms: no chest pain, no fever and no shortness of breath   HPI Comments: Jonathan [name] is a 34 y.o. male who presents to the Emergency Department complaining of constant non-productive cough over the course of past 5 months. Pt states that cough has become productive in past week (green mucus). No complaints of fever, vomiting, abdominal pain, chest pain, nausea, or vomiting. Pt is seeking Metformin refill due to not being able to get in contact with primary health clinic. Pt adds that he does not have PCP. Pt is non-smoker.    Past Medical History  Diagnosis Date  . Diabetes mellitus    History reviewed. No pertinent past surgical history. History reviewed. No pertinent family history. History  Substance Use Topics  . Smoking status: Never Smoker   . Smokeless tobacco: Not on file  . Alcohol Use: Yes    Review of Systems  Constitutional: Negative for fever.  HENT: Positive for sore throat.   Respiratory: Positive for cough. Negative for shortness of breath.   Cardiovascular: Negative for chest pain.  Gastrointestinal: Negative for nausea and vomiting.  All other systems reviewed and are negative.     Allergies  Review of patient's allergies indicates no known allergies.  Home Medications   Prior to Admission medications   Medication  Sig Start Date End Date Taking? Authorizing Provider  Doxylamine Succinate, Sleep, (EQ SLEEP AID PO) Take 1-2 tablets by mouth at bedtime as needed (sleep).   Yes Historical Provider, MD  metFORMIN (GLUCOPHAGE) 1000 MG tablet Take 1 tablet (1,000 mg total) by mouth 2 (two) times daily. 01/06/14  Yes Nicole Pisciotta, PA-C  acetaminophen (TYLENOL) 500 MG tablet Take 1,000 mg by mouth every 6 (six) hours as needed for headache.    Historical Provider, MD   BP 131/73 mmHg  Pulse 98  Temp(Src) 98.1 F (36.7 C) (Oral)  Resp 22  Ht 6\' 8"  (2.032 m)  Wt 220 lb (99.791 kg)  BMI 24.17 kg/m2  SpO2 98% Physical Exam CONSTITUTIONAL: Well developed/well nourished HEAD: Normocephalic/atraumatic EYES: EOMI/PERRL ENMT: Mucous membranes moist. Uvula midline. No exudates or erythema.  NECK: supple no meningeal signs CV: S1/S2 noted, no murmurs/rubs/gallops noted LUNGS: Lungs are clear to auscultation bilaterally, no apparent distress ABDOMEN: soft, nontender, no rebound or guarding, bowel sounds noted throughout abdomen NEURO: Pt is awake/alert/appropriate, moves all extremitiesx4.  No facial droop.   EXTREMITIES: pulses normal/equal, full ROM SKIN: warm, color normal PSYCH: no abnormalities of mood noted, alert and oriented to situation   ED Course  Procedures  Imaging Review Dg Chest 2 View (if Patient Has Fever And/or Copd)  03/15/2014   CLINICAL DATA:  Chronic cough for 2 months. Headache. Initial encounter.  EXAM: CHEST  2 VIEW  COMPARISON:  Chest radiograph performed 01/06/2014  FINDINGS: The lungs are well-aerated  and clear. There is no evidence of focal opacification, pleural effusion or pneumothorax.  The heart is normal in size; the mediastinal contour is within normal limits. No acute osseous abnormalities are seen.  IMPRESSION: No acute cardiopulmonary process seen.   Electronically Signed   By: Roanna Raider M.D.   On: 03/15/2014 02:17    3:53 AM- Treatment plan was discussed with  patient who verbalizes understanding and agrees.  CXR negative He is well appearing, playing on cell phone Will refill metformin and advised need for PCP followup  MDM   Final diagnoses:  Cough  Other specified diabetes mellitus with unspecified complications    Nursing notes including past medical history and social history reviewed and considered in documentation xrays/imaging reviewed by myself and considered during evaluation   I personally performed the services described in this documentation, which was scribed in my presence. The recorded information has been reviewed and is accurate.   Jonathan Gaskins, MD 03/15/14 [phone]

## 2014-03-15 NOTE — Discharge Instructions (Signed)
Cough, Adult   A cough is a reflex. It helps you clear your throat and airways. A cough can help heal your body. A cough can last 2 or 3 weeks (acute) or may last more than 8 weeks (chronic). Some common causes of a cough can include an infection, allergy, or a cold.  HOME CARE  · Only take medicine as told by your doctor.  · If given, take your medicines (antibiotics) as told. Finish them even if you start to feel better.  · Use a cold steam vaporizer or humidifier in your home. This can help loosen thick spit (secretions).  · Sleep so you are almost sitting up (semi-upright). Use pillows to do this. This helps reduce coughing.  · Rest as needed.  · Stop smoking if you smoke.  GET HELP RIGHT AWAY IF:  · You have yellowish-white fluid (pus) in your thick spit.  · Your cough gets worse.  · Your medicine does not reduce coughing, and you are losing sleep.  · You cough up blood.  · You have trouble breathing.  · Your pain gets worse and medicine does not help.  · You have a fever.  MAKE SURE YOU:   · Understand these instructions.  · Will watch your condition.  · Will get help right away if you are not doing well or get worse.  Document Released: 10/28/2010 Document Revised: 07/01/2013 Document Reviewed: 10/28/2010  ExitCare® Patient Information ©2015 [employer]. This information is not intended to replace advice given to you by your health care provider. Make sure you discuss any questions you have with your health care provider.

## 2014-03-15 NOTE — ED Notes (Signed)
Additionally patient reports he needs his metformin filled.

## 2014-03-15 NOTE — ED Notes (Signed)
States cough x2 months but not productive. States about a week ago cough became productive of green mucus. NAD. Denies fever at home.

## 2014-06-12 ENCOUNTER — Ambulatory Visit: Payer: Self-pay

## 2014-06-12 ENCOUNTER — Ambulatory Visit: Payer: Self-pay | Attending: Internal Medicine | Admitting: Internal Medicine

## 2014-06-12 ENCOUNTER — Encounter: Payer: Self-pay | Admitting: Internal Medicine

## 2014-06-12 VITALS — BP 134/83 | HR 98 | Temp 97.6°F | Resp 16 | Ht >= 80 in | Wt 226.0 lb

## 2014-06-12 DIAGNOSIS — E1140 Type 2 diabetes mellitus with diabetic neuropathy, unspecified: Secondary | ICD-10-CM | POA: Insufficient documentation

## 2014-06-12 DIAGNOSIS — IMO0002 Reserved for concepts with insufficient information to code with codable children: Secondary | ICD-10-CM | POA: Insufficient documentation

## 2014-06-12 DIAGNOSIS — E118 Type 2 diabetes mellitus with unspecified complications: Secondary | ICD-10-CM | POA: Insufficient documentation

## 2014-06-12 DIAGNOSIS — E1165 Type 2 diabetes mellitus with hyperglycemia: Secondary | ICD-10-CM | POA: Insufficient documentation

## 2014-06-12 DIAGNOSIS — N521 Erectile dysfunction due to diseases classified elsewhere: Secondary | ICD-10-CM | POA: Insufficient documentation

## 2014-06-12 LAB — LIPID PANEL
CHOL/HDL RATIO: 3.1 ratio
Cholesterol: 119 mg/dL (ref 0–200)
HDL: 39 mg/dL — AB (ref >=40)
LDL Cholesterol: 57 mg/dL (ref 0–99)
Triglycerides: 117 mg/dL (ref <150)
VLDL: 23 mg/dL (ref 0–40)

## 2014-06-12 LAB — COMPLETE METABOLIC PANEL WITH GFR
ALBUMIN: 4.3 g/dL (ref 3.5–5.2)
ALT: 26 U/L (ref 0–53)
AST: 16 U/L (ref 0–37)
Alkaline Phosphatase: 67 U/L (ref 39–117)
BILIRUBIN TOTAL: 0.5 mg/dL (ref 0.2–1.2)
BUN: 8 mg/dL (ref 6–23)
CHLORIDE: 102 meq/L (ref 96–112)
CO2: 30 meq/L (ref 19–32)
Calcium: 9.8 mg/dL (ref 8.4–10.5)
Creat: 0.79 mg/dL (ref 0.50–1.35)
GFR, Est Non African American: >89 mL/min
GLUCOSE: 217 mg/dL — AB (ref 70–99)
Potassium: 4.8 mEq/L (ref 3.5–5.3)
SODIUM: 137 meq/L (ref 135–145)
TOTAL PROTEIN: 7 g/dL (ref 6.0–8.3)

## 2014-06-12 LAB — POCT URINALYSIS DIPSTICK
Bilirubin, UA: NEGATIVE
Blood, UA: NEGATIVE
GLUCOSE UA: 500
Ketones, UA: NEGATIVE
Leukocytes, UA: NEGATIVE
NITRITE UA: NEGATIVE
Protein, UA: NEGATIVE
Spec Grav, UA: 1.02
UROBILINOGEN UA: 0.2
pH, UA: 6.5

## 2014-06-12 LAB — GLUCOSE, POCT (MANUAL RESULT ENTRY): POC Glucose: 283 mg/dl — AB (ref 70–99)

## 2014-06-12 LAB — POCT GLYCOSYLATED HEMOGLOBIN (HGB A1C): HEMOGLOBIN A1C: 9.5

## 2014-06-12 MED ORDER — METFORMIN HCL 1000 MG PO TABS: 1000.0000 mg | ORAL_TABLET | Freq: Two times a day (BID) | ORAL | Status: DC

## 2014-06-12 MED ORDER — GLUCOSE BLOOD VI STRP: ORAL_STRIP | Status: DC

## 2014-06-12 MED ORDER — FREESTYLE LANCETS MISC: Status: DC

## 2014-06-12 MED ORDER — FREESTYLE SYSTEM KIT: 1.0000 | PACK | Status: DC | PRN

## 2014-06-12 MED ORDER — SILDENAFIL CITRATE 25 MG PO TABS: 25.0000 mg | ORAL_TABLET | Freq: Every day | ORAL | Status: DC | PRN

## 2014-06-12 MED ORDER — GABAPENTIN 100 MG PO CAPS: 100.0000 mg | ORAL_CAPSULE | Freq: Two times a day (BID) | ORAL | Status: DC

## 2014-06-12 MED ORDER — GLIPIZIDE ER 2.5 MG PO TB24: 2.5000 mg | ORAL_TABLET | Freq: Every day | ORAL | Status: DC

## 2014-06-12 NOTE — Patient Instructions (Signed)
Sildenafil tablets (Viagra) What is this medicine? SILDENAFIL (sil DEN a fil) is used to treat erection problems in men. This medicine may be used for other purposes; ask your health care provider or pharmacist if you have questions. COMMON BRAND NAME(S): Viagra What should I tell my health care provider before I take this medicine? They need to know if you have any of these conditions: -bleeding disorders -eye or vision problems, including a rare inherited eye disease called retinitis pigmentosa -anatomical deformation of the penis, Peyronie's disease, or history of priapism (painful and prolonged erection) -heart disease, angina, a history of heart attack, irregular heart beats, or other heart problems -high or low blood pressure -history of blood diseases, like sickle cell anemia or leukemia -history of stomach bleeding -kidney disease -liver disease -stroke -an unusual or allergic reaction to sildenafil, other medicines, foods, dyes, or preservatives -pregnant or trying to get pregnant -breast-feeding How should I use this medicine? Take this medicine by mouth with a glass of water. Follow the directions on the prescription label. The dose is usually taken 1 hour before sexual activity. You should not take the dose more than once per day. Do not take your medicine more often than directed. Talk to your pediatrician regarding the use of this medicine in children. This medicine is not used in children for this condition. Overdosage: If you think you have taken too much of this medicine contact a poison control center or emergency room at once. NOTE: This medicine is only for you. Do not share this medicine with others. What if I miss a dose? This does not apply. Do not take double or extra doses. What may interact with this medicine? Do not take this medicine with any of the following medications: -cisapride -methscopolamine nitrate -nitrates like amyl nitrite, isosorbide dinitrate,  isosorbide mononitrate, nitroglycerin -nitroprusside -other medicines for erectile dysfunction like avanafil, tadalafil, vardenafil -other sildenafil products (Revatio) This medicine may also interact with the following medications: -certain drugs for high blood pressure -certain drugs for the treatment of HIV infection or AIDS -certain drugs used for fungal or yeast infections, like fluconazole, itraconazole, ketoconazole, and voriconazole -cimetidine -erythromycin -rifampin This list may not describe all possible interactions. Give your health care provider a list of all the medicines, herbs, non-prescription drugs, or dietary supplements you use. Also tell them if you smoke, drink alcohol, or use illegal drugs. Some items may interact with your medicine. What should I watch for while using this medicine? If you notice any changes in your vision while taking this drug, call your doctor or health care professional as soon as possible. Stop using this medicine and call your health care provider right away if you have a loss of sight in one or both eyes. Contact your doctor or health care professional right away if you have an erection that lasts longer than 4 hours or if it becomes painful. This may be a sign of a serious problem and must be treated right away to prevent permanent damage. If you experience symptoms of nausea, dizziness, chest pain or arm pain upon initiation of sexual activity after taking this medicine, you should refrain from further activity and call your doctor or health care professional as soon as possible. Do not drink alcohol to excess (examples, 5 glasses of wine or 5 shots of whiskey) when taking this medicine. When taken in excess, alcohol can increase your chances of getting a headache or getting dizzy, increasing your heart rate or lowering your blood pressure.  Using this medicine does not protect you or your partner against HIV infection (the virus that causes AIDS) or  other sexually transmitted diseases. What side effects may I notice from receiving this medicine? Side effects that you should report to your doctor or health care professional as soon as possible: -allergic reactions like skin rash, itching or hives, swelling of the face, lips, or tongue -breathing problems -changes in hearing -changes in vision -chest pain -fast, irregular heartbeat -prolonged or painful erection -seizures Side effects that usually do not require medical attention (report to your doctor or health care professional if they continue or are bothersome): -back pain -dizziness -flushing -headache -indigestion -muscle aches -nausea -stuffy or runny nose This list may not describe all possible side effects. Call your doctor for medical advice about side effects. You may report side effects to FDA at 1-800-FDA-1088. Where should I keep my medicine? Keep out of reach of children. Store at room temperature between 15 and 30 degrees C (59 and 86 degrees F). Throw away any unused medicine after the expiration date. NOTE: This sheet is a summary. It may not cover all possible information. If you have questions about this medicine, talk to your doctor, pharmacist, or health care provider.  2015, Elsevier/Gold Standard. (2012-02-15 12:43:54)      Diabetes and Standards of Medical Care Diabetes is complicated. You may find that your diabetes team includes a dietitian, nurse, diabetes educator, eye doctor, and more. To help everyone know what is going on and to help you get the care you deserve, the following schedule of care was developed to help keep you on track. Below are the tests, exams, vaccines, medicines, education, and plans you will need. HbA1c test This test shows how well you have controlled your glucose over the past 2-3 months. It is used to see if your diabetes management plan needs to be adjusted.   It is performed at least 2 times a year if you are meeting  treatment goals.  It is performed 4 times a year if therapy has changed or if you are not meeting treatment goals. Blood pressure test  This test is performed at every routine medical visit. The goal is less than 140/90 mm Hg for most people, but 130/80 mm Hg in some cases. Ask your health care provider about your goal. Dental exam  Follow up with the dentist regularly. Eye exam  If you are diagnosed with type 1 diabetes as a child, get an exam upon reaching the age of 65 years or older and have had diabetes for 3-5 years. Yearly eye exams are recommended after that initial eye exam.  If you are diagnosed with type 1 diabetes as an adult, get an exam within 5 years of diagnosis and then yearly.  If you are diagnosed with type 2 diabetes, get an exam as soon as possible after the diagnosis and then yearly. Foot care exam  Visual foot exams are performed at every routine medical visit. The exams check for cuts, injuries, or other problems with the feet.  A comprehensive foot exam should be done yearly. This includes visual inspection as well as assessing foot pulses and testing for loss of sensation.  Check your feet nightly for cuts, injuries, or other problems with your feet. Tell your health care provider if anything is not healing. Kidney function test (urine microalbumin)  This test is performed once a year.  Type 1 diabetes: The first test is performed 5 years after diagnosis.  Type  2 diabetes: The first test is performed at the time of diagnosis.  A serum creatinine and estimated glomerular filtration rate (eGFR) test is done once a year to assess the level of chronic kidney disease (CKD), if present. Lipid profile (cholesterol, HDL, LDL, triglycerides)  Performed every 5 years for most people.  The goal for LDL is less than 100 mg/dL. If you are at high risk, the goal is less than 70 mg/dL.  The goal for HDL is 40 mg/dL-50 mg/dL for men and 50 mg/dL-60 mg/dL for women. An  HDL cholesterol of 60 mg/dL or higher gives some protection against heart disease.  The goal for triglycerides is less than 150 mg/dL. Influenza vaccine, pneumococcal vaccine, and hepatitis B vaccine  The influenza vaccine is recommended yearly.  It is recommended that people with diabetes who are over 61 years old get the pneumonia vaccine. In some cases, two separate shots may be given. Ask your health care provider if your pneumonia vaccination is up to date.  The hepatitis B vaccine is also recommended for adults with diabetes. Diabetes self-management education  Education is recommended at diagnosis and ongoing as needed. Treatment plan  Your treatment plan is reviewed at every medical visit. Document Released: 12/12/2008 Document Revised: 07/01/2013 Document Reviewed: 07/17/2012 [facility] Patient Information 2015 [place], [place]. This information is not intended to replace advice given to you by your health care provider. Make sure you discuss any questions you have with your health care provider.

## 2014-06-12 NOTE — Progress Notes (Signed)
Patient ID: [employer], male   DOB: June 27, 1980, 34 y.o.   MRN: [id number]  APO:[id number]  YHO:[id number]  DOB - 09-02-1980  CC:  Chief Complaint  Patient presents with  . Establish Care       HPI: Jonathan [name] is a 34 y.o. male here today to establish medical care. Patient reports that he was diagnosed with T2DM in 2011. He has been taking Metformin 1,000 mg twice per day. He reports that for the past one year he has been having a pins and needles sensation in his feet. He plays [employer] daily for 2 hours but states that he has had to cut back on playing because of the pain in his feet.    No Known Allergies Past Medical History  Diagnosis Date  . Diabetes mellitus    Current Outpatient Prescriptions on File Prior to Visit  Medication Sig Dispense Refill  . metFORMIN (GLUCOPHAGE) 1000 MG tablet Take 1 tablet (1,000 mg total) by mouth 2 (two) times daily. 60 tablet 0  . [DISCONTINUED] Doxylamine Succinate, Sleep, (EQ SLEEP AID PO) Take 1-2 tablets by mouth at bedtime as needed (sleep).     No current facility-administered medications on file prior to visit.   History reviewed. No pertinent family history. History   Social History  . Marital Status: Single    Spouse Name: N/A  . Number of Children: N/A  . Years of Education: N/A   Occupational History  . Not on file.   Social History Main Topics  . Smoking status: Never Smoker   . Smokeless tobacco: Not on file  . Alcohol Use: Yes  . Drug Use: No  . Sexual Activity: Not on file   Other Topics Concern  . Not on file   Social History Narrative    Review of Systems  Eyes: Positive for blurred vision.  Genitourinary: Negative for frequency.       Erectile dysfunction since January. Problems getting and maintain erections and when he does he is unable to ejaculate.   Neurological: Positive for tingling and headaches. Negative for dizziness.  Endo/Heme/Allergies: Negative for polydipsia.  All other systems  reviewed and are negative.      Objective:   Filed Vitals:   06/12/14 1013  BP: 134/83  Pulse: 98  Temp: 97.6 F (36.4 C)  Resp: 16    Physical Exam: Constitutional: Patient appears well-developed and well-nourished. No distress. HENT: Normocephalic, atraumatic, External right and left ear normal. Oropharynx is clear and moist.  Eyes: Conjunctivae and EOM are normal. PERRLA, no scleral icterus. Neck: Normal ROM. Neck supple. No JVD. No tracheal deviation. No thyromegaly. CVS: RRR, S1/S2 +, no murmurs, no gallops, no carotid bruit.  Pulmonary: Effort and breath sounds normal, no stridor, rhonchi, wheezes, rales.  Abdominal: Soft. BS +, no distension, tenderness, rebound or guarding.  Musculoskeletal: Normal range of motion. No edema and no tenderness.  Neuro: Alert.  Skin: Skin is warm and dry. No rash noted. Not diaphoretic. No erythema. No pallor. Psychiatric: Normal mood and affect. Behavior, judgment, thought content normal.  Lab Results  Component Value Date   WBC 4.9 01/05/2014   HGB 14.9 01/05/2014   HCT 44.7 01/05/2014   MCV 79.1 01/05/2014   PLT 163 01/05/2014   Lab Results  Component Value Date   CREATININE 0.91 01/05/2014   BUN 13 01/05/2014   NA 136* 01/05/2014   K 4.3 01/05/2014   CL 99 01/05/2014   CO2 25 01/05/2014    Lab Results  Component Value Date   HGBA1C 9.5 06/12/2014   Lipid Panel     Component Value Date/Time   CHOL 119 03/22/2012 1904   TRIG 59 03/22/2012 1904   HDL 34* 03/22/2012 1904   CHOLHDL 3.5 03/22/2012 1904   VLDL 12 03/22/2012 1904   LDLCALC 73 03/22/2012 1904       Assessment and plan:   Jonathan [name] was seen today for establish care.  Diagnoses and all orders for this visit:  DM type 2, uncontrolled, with neuropathy Orders: -     Glucose (CBG) -     HgB A1c -     Urinalysis Dipstick -     Microalbumin, urine -     Tdap vaccine greater than or equal to 7yo IM -     Lipid panel -     COMPLETE METABOLIC PANEL WITH  GFR -     Amb Referral to Nutrition and Diabetic E -     begin gabapentin (NEURONTIN) 100 MG capsule; Take 1 capsule (100 mg total) by mouth 2 (two) times daily. -     Refill metFORMIN (GLUCOPHAGE) 1000 MG tablet; Take 1 tablet (1,000 mg total) by mouth 2 (two) times daily. -     glipiZIDE (GLIPIZIDE XL) 2.5 MG 24 hr tablet; Take 1 tablet (2.5 mg total) by mouth daily with breakfast. -     glucose monitoring kit (FREESTYLE) monitoring kit; 1 each by Does not apply route as needed. -     glucose blood test strip; Use as instructed -     Lancets (FREESTYLE) lancets; Use as instructed Diabetes is not well controlled. I will send him to diabetic education for more discussion on diet. I have went over standards of care and some major diet changes that he should make.   Erectile dysfunction due to diseases classified elsewhere Orders: -     sildenafil (VIAGRA) 25 MG tablet; Take 1 tablet (25 mg total) by mouth daily as needed for erectile dysfunction. Patient states that his girlfriend just moved here from [place] and he has been feeling bad about disappointing her with his lack of sexual function. I have explained that with better glycemic control he may have improvement in sexual function. Will give short course of viagra. I have sent over side effects that reason he should seek prompt medical attemtion.  >50% of visit was spent counseling on diabetes management and long term complications.   Return in about 3 months (around 09/11/2014) for Diabetes Mellitus.    [name], [facility] [phone] 06/12/2014, 10:36 AM

## 2014-06-12 NOTE — Progress Notes (Signed)
Pt is here to establish care. Pt has a history of diabetes. Pt states that his feet feels like he is stepping on needles. Pt is c/o ED.

## 2014-06-13 LAB — MICROALBUMIN, URINE: MICROALB UR: 0.8 mg/dL (ref <2.0)

## 2014-06-20 ENCOUNTER — Telehealth: Payer: Self-pay | Admitting: *Deleted

## 2014-06-20 NOTE — Telephone Encounter (Signed)
-----   Message from Ambrose FinlandValerie A Keck, NP sent at 06/18/2014 12:04 AM EDT ----- Labs are within normal limits

## 2014-06-20 NOTE — Telephone Encounter (Signed)
Phones is not working.

## 2014-06-27 ENCOUNTER — Other Ambulatory Visit: Payer: Self-pay | Admitting: Internal Medicine

## 2014-06-27 DIAGNOSIS — N521 Erectile dysfunction due to diseases classified elsewhere: Secondary | ICD-10-CM

## 2014-06-27 MED ORDER — SILDENAFIL CITRATE 25 MG PO TABS: 25.0000 mg | ORAL_TABLET | Freq: Every day | ORAL | Status: DC | PRN

## 2014-07-02 ENCOUNTER — Ambulatory Visit: Payer: Self-pay | Attending: Internal Medicine | Admitting: Internal Medicine

## 2014-07-02 VITALS — BP 131/84 | HR 84 | Temp 98.0°F | Ht >= 80 in | Wt 233.0 lb

## 2014-07-02 DIAGNOSIS — N529 Male erectile dysfunction, unspecified: Secondary | ICD-10-CM

## 2014-07-02 DIAGNOSIS — K0889 Other specified disorders of teeth and supporting structures: Secondary | ICD-10-CM

## 2014-07-02 DIAGNOSIS — K088 Other specified disorders of teeth and supporting structures: Secondary | ICD-10-CM

## 2014-07-02 DIAGNOSIS — R059 Cough, unspecified: Secondary | ICD-10-CM

## 2014-07-02 DIAGNOSIS — R05 Cough: Secondary | ICD-10-CM

## 2014-07-02 MED ORDER — TRAMADOL HCL 50 MG PO TABS: 50.0000 mg | ORAL_TABLET | Freq: Three times a day (TID) | ORAL | Status: DC | PRN

## 2014-07-02 MED ORDER — LORATADINE 10 MG PO TABS: 10.0000 mg | ORAL_TABLET | Freq: Every day | ORAL | Status: DC

## 2014-07-02 MED ORDER — AMOXICILLIN 500 MG PO CAPS: 500.0000 mg | ORAL_CAPSULE | Freq: Three times a day (TID) | ORAL | Status: DC

## 2014-07-02 NOTE — Progress Notes (Signed)
Patient here about his intermittent toothache.  He states he went to the emergency room in 2009 and they gave him an antibiotic and pain medication.   His mouth felt better for a period of years and it is not bothering him again.  His pain is on the upper and lower left side of his mouth.  He reports an aching pain 8/10 He is also complaining a of a nagging dry cough that he has had for at least a year.  He says he has had x-rays that were all negative

## 2014-07-02 NOTE — Progress Notes (Signed)
Patient ID: Jonathan [name], male   DOB: 07/10/1980, 34 y.o.   MRN: [id number]  CC: cough, toothache  HPI: Jonathan [name] is a 34 y.o. male here today for a follow up visit.  Patient has past medical history of T2DM. He reports that he has had a intermittent toothache for the past couple of weeks. He has been seen by the ER recently for toothache and was referred to dentistry. He has never been to a dentist. He is also c/o a dry cough that he has had for one year. He has had chest x-rays that were negative. He denies fever, chills, nausea, vomiting, itching eyes, PND.    Patient has No headache, No chest pain, No abdominal pain - No Nausea, No new weakness tingling or numbness, No Cough - SOB.  No Known Allergies Past Medical History  Diagnosis Date  . Diabetes mellitus    Current Outpatient Prescriptions on File Prior to Visit  Medication Sig Dispense Refill  . gabapentin (NEURONTIN) 100 MG capsule Take 1 capsule (100 mg total) by mouth 2 (two) times daily. 60 capsule 3  . glipiZIDE (GLIPIZIDE XL) 2.5 MG 24 hr tablet Take 1 tablet (2.5 mg total) by mouth daily with breakfast. 30 tablet 4  . glucose blood test strip Use as instructed 100 each 12  . glucose monitoring kit (FREESTYLE) monitoring kit 1 each by Does not apply route as needed. 1 each 0  . Lancets (FREESTYLE) lancets Use as instructed 100 each 12  . metFORMIN (GLUCOPHAGE) 1000 MG tablet Take 1 tablet (1,000 mg total) by mouth 2 (two) times daily. 60 tablet 4  . sildenafil (VIAGRA) 25 MG tablet Take 1 tablet (25 mg total) by mouth daily as needed for erectile dysfunction. (Patient not taking: Reported on 07/02/2014) 30 tablet 3  . [DISCONTINUED] Doxylamine Succinate, Sleep, (EQ SLEEP AID PO) Take 1-2 tablets by mouth at bedtime as needed (sleep).     No current facility-administered medications on file prior to visit.   No family history on file. History   Social History  . Marital Status: Single    Spouse Name: N/A  .  Number of Children: N/A  . Years of Education: N/A   Occupational History  . Not on file.   Social History Main Topics  . Smoking status: Never Smoker   . Smokeless tobacco: Not on file  . Alcohol Use: Yes  . Drug Use: No  . Sexual Activity: Not on file   Other Topics Concern  . Not on file   Social History Narrative    Review of Systems See HPI    Objective:   Filed Vitals:   07/02/14 1623  BP: 131/84  Pulse: 84  Temp: 98 F (36.7 C)    Physical Exam  Constitutional: He is oriented to person, place, and time.  Cardiovascular: Normal rate, regular rhythm and normal heart sounds.   Pulmonary/Chest: Effort normal and breath sounds normal. He has no wheezes.  Neurological: He is alert and oriented to person, place, and time.  Skin: Skin is warm and dry.     Lab Results  Component Value Date   WBC 4.9 01/05/2014   HGB 14.9 01/05/2014   HCT 44.7 01/05/2014   MCV 79.1 01/05/2014   PLT 163 01/05/2014   Lab Results  Component Value Date   CREATININE 0.79 06/12/2014   BUN 8 06/12/2014   NA 137 06/12/2014   K 4.8 06/12/2014   CL 102 06/12/2014   CO2 30 06/12/2014  Lab Results  Component Value Date   HGBA1C 9.5 06/12/2014   Lipid Panel     Component Value Date/Time   CHOL 119 06/12/2014 1106   TRIG 117 06/12/2014 1106   HDL 39* 06/12/2014 1106   CHOLHDL 3.1 06/12/2014 1106   VLDL 23 06/12/2014 1106   LDLCALC 57 06/12/2014 1106       Assessment and plan:   Diagnoses and all orders for this visit:  Pain, dental Orders: -    Begin amoxicillin (AMOXIL) 500 MG capsule; Take 1 capsule (500 mg total) by mouth 3 (three) times daily. -    Begin traMADol (ULTRAM) 50 MG tablet; Take 1 tablet (50 mg total) by mouth every 8 (eight) hours as needed.  Cough Orders: -    Begin loratadine (CLARITIN) 10 MG tablet; Take 1 tablet (10 mg total) by mouth daily. Cough related to allergies   Erectile dysfunction, unspecified erectile dysfunction  type Orders: -     Testosterone Will evaluate levels today.   Follow up as scheduled      [name], [facility] 07/07/2014, 10:41 PM

## 2014-07-03 ENCOUNTER — Telehealth: Payer: Self-pay | Admitting: *Deleted

## 2014-07-03 LAB — TESTOSTERONE: TESTOSTERONE: 125 ng/dL — AB (ref 300–890)

## 2014-07-03 NOTE — Telephone Encounter (Signed)
-----   Message from Ambrose FinlandValerie A Keck, NP sent at 07/03/2014  7:38 AM EDT ----- Testosterone level is low. That is likely the cause of his sexual dysfunction. We can recheck in 6 weeks to make sure. Please obtain orange card in meantime because if it is still low he will need to go to a specialist

## 2014-07-03 NOTE — Telephone Encounter (Signed)
Pt is aware of his lab results. 

## 2014-07-07 ENCOUNTER — Encounter: Payer: Self-pay | Admitting: Internal Medicine

## 2014-08-21 ENCOUNTER — Encounter (HOSPITAL_COMMUNITY): Payer: Self-pay | Admitting: *Deleted

## 2014-08-21 ENCOUNTER — Emergency Department (HOSPITAL_COMMUNITY)
Admission: EM | Admit: 2014-08-21 | Discharge: 2014-08-21 | Disposition: A | Discharge status: Home / Self Care | Payer: Self-pay | Attending: Emergency Medicine | Admitting: Emergency Medicine

## 2014-08-21 DIAGNOSIS — E119 Type 2 diabetes mellitus without complications: Secondary | ICD-10-CM | POA: Insufficient documentation

## 2014-08-21 DIAGNOSIS — K0889 Other specified disorders of teeth and supporting structures: Secondary | ICD-10-CM

## 2014-08-21 DIAGNOSIS — Z792 Long term (current) use of antibiotics: Secondary | ICD-10-CM | POA: Insufficient documentation

## 2014-08-21 DIAGNOSIS — K088 Other specified disorders of teeth and supporting structures: Secondary | ICD-10-CM | POA: Insufficient documentation

## 2014-08-21 DIAGNOSIS — Z79899 Other long term (current) drug therapy: Secondary | ICD-10-CM | POA: Insufficient documentation

## 2014-08-21 MED ORDER — BUPIVACAINE-EPINEPHRINE (PF) 0.5% -1:200000 IJ SOLN
1.8000 mL | Freq: Once | INTRAMUSCULAR | Status: AC
  Administered 2014-08-21: 1.8 mL

## 2014-08-21 MED ORDER — LIDOCAINE HCL 2 % IJ SOLN: 10.0000 mL | Freq: Once | INTRAMUSCULAR | Status: DC

## 2014-08-21 NOTE — Discharge Instructions (Signed)
His follow-up with the resources given. Please monitor for new or worsening signs or symptoms, follow-up immediately. And ibuprofen as needed for pain.

## 2014-08-21 NOTE — ED Notes (Signed)
Pt reports since 2013 when his dental pain started he has not been referred to a dentist  . Pt provided with 3 different dentist  To call for an appt.

## 2014-08-21 NOTE — ED Provider Notes (Signed)
CSN: [id number]     Arrival date & time 08/21/14  1141 History  This chart was scribed for non-physician practitioner, Okey Regal, PA-C working with Noemi Chapel, MD by Rayna Sexton, ED scribe. This patient was seen in room TR08C/TR08C and the patient's care was started at 11:57 AM.    Chief Complaint  Patient presents with  . Dental Pain   The history is provided by the patient. No language interpreter was used.    HPI Comments:  Jonathan [name] is a 34 y.o. male who presents to the Emergency Department complaining of constant, mild, left sided dental pain with onset 2 days ago. He notes a history of tooth aches with his last incident being 2 months ago. He notes receiving prescription pain medication and antibiotics with a momentary relief of symptoms for his last incident and is currently taking OTC pain medication with little relief of symptoms. He notes a worsening of symptoms when drinking cold beverages. Pt denies having a current dental provider. He denies a fever, chills or neck stiffness, trismus, intraoral swelling, occult he swallowing or breathing, drooling changes in voice. Patient denies ear pain, changes in hearing.   Past Medical History  Diagnosis Date  . Diabetes mellitus    History reviewed. No pertinent past surgical history. History reviewed. No pertinent family history. History  Substance Use Topics  . Smoking status: Never Smoker   . Smokeless tobacco: Not on file  . Alcohol Use: Yes    Review of Systems  All other systems reviewed and are negative.   Allergies  Review of patient's allergies indicates no known allergies.  Home Medications   Prior to Admission medications   Medication Sig Start Date End Date Taking? Authorizing Provider  amoxicillin (AMOXIL) 500 MG capsule Take 1 capsule (500 mg total) by mouth 3 (three) times daily. 07/02/14   Lance Bosch, NP  gabapentin (NEURONTIN) 100 MG capsule Take 1 capsule (100 mg total) by mouth 2 (two)  times daily. 06/12/14   Lance Bosch, NP  glipiZIDE (GLIPIZIDE XL) 2.5 MG 24 hr tablet Take 1 tablet (2.5 mg total) by mouth daily with breakfast. 06/12/14   Lance Bosch, NP  glucose blood test strip Use as instructed 06/12/14   Lance Bosch, NP  glucose monitoring kit (FREESTYLE) monitoring kit 1 each by Does not apply route as needed. 06/12/14   Lance Bosch, NP  Lancets (FREESTYLE) lancets Use as instructed 06/12/14   Lance Bosch, NP  loratadine (CLARITIN) 10 MG tablet Take 1 tablet (10 mg total) by mouth daily. 07/02/14   Lance Bosch, NP  metFORMIN (GLUCOPHAGE) 1000 MG tablet Take 1 tablet (1,000 mg total) by mouth 2 (two) times daily. 06/12/14   Lance Bosch, NP  sildenafil (VIAGRA) 25 MG tablet Take 1 tablet (25 mg total) by mouth daily as needed for erectile dysfunction. Patient not taking: Reported on 07/02/2014 06/27/14   Tresa Garter, MD  traMADol (ULTRAM) 50 MG tablet Take 1 tablet (50 mg total) by mouth every 8 (eight) hours as needed. 07/02/14   Lance Bosch, NP   BP 140/91 mmHg  Pulse 77  Temp(Src) 98 F (36.7 C) (Oral)  Resp 20  Ht _0  (2.032 m)  Wt 230 lb (104.327 kg)  BMI 25.27 kg/m2  SpO2 100%   Physical Exam  Constitutional: He is oriented to person, place, and time. He appears well-developed and well-nourished. No distress.  HENT:  Head: Normocephalic and atraumatic.  Right Ear: Tympanic membrane and external ear normal.  Left Ear: Tympanic membrane and external ear normal.  Mouth/Throat: Oropharynx is clear and moist and mucous membranes are normal. No oral lesions. No dental abscesses. No tonsillar abscesses.  Left sided upper upper molar pain; no abscesses; 1 prior dental filling from second molar. No signs of tooth cracks, no abscesses felt along the gumline, roof of mouth, tongue normal, floor mouth soft   Eyes: Conjunctivae and EOM are normal. Pupils are equal, round, and reactive to light.  Neck: Normal range of motion. Neck supple. No tracheal  deviation present.  Cardiovascular: Normal rate.   Pulmonary/Chest: Breath sounds normal. No respiratory distress.  Abdominal: Soft.  Musculoskeletal: Normal range of motion.  Neurological: He is alert and oriented to person, place, and time.  Skin: Skin is warm and dry.  Psychiatric: He has a normal mood and affect. His behavior is normal.  Nursing note and vitals reviewed.   ED Course  Procedures   Dental block:  Buccal nerve block performed by myself, risks and benefits discussed with patient, upper patient identified site indicated and verbalized by patient. Marcaine 0.5% 1 mL injected along the focal nerve about the second molar with a posterior direction. No complications. Patient reports some relief of dental pain.  COORDINATION OF CARE: 12:04 PM Discussed treatment plan with pt at bedside and pt agreed to plan.  Labs Review Labs Reviewed - No data to display  Imaging Review No results found.   EKG Interpretation None      MDM   Final diagnoses:  Pain, dental    Labs: none indicated  Imaging: none indicated  Consults: none  Therapeutics: .5% Marcaine  Assessment: Intubated  Plan: Patient presents with dental pain. History of the same that resolved without specialist intervention. No sgns of infection on today's exam, no loose teeth or signs of cracks. Dental block performed with minimal relief of symptoms per patient. Patient given resources for dental referral, encouraged to use ibuprofen or Tylenol as needed for pain, monitor for signs of infection and follow-up immediately if any present. Patient verbalizes understanding and agreement for today's plan and had no further questions or concerns at the time of discharge   I personally performed the services described in this documentation, which was scribed in my presence. The recorded information has been reviewed and is accurate.       Okey Regal, PA-C 08/21/14 1317  Noemi Chapel, MD 08/22/14  2024

## 2014-08-21 NOTE — ED Notes (Signed)
Pt reports 2 day hx of dental pain. Pt has not seen a dentist for pain.

## 2014-09-11 ENCOUNTER — Encounter: Payer: Self-pay | Admitting: Internal Medicine

## 2014-09-11 ENCOUNTER — Ambulatory Visit: Payer: Self-pay | Attending: Internal Medicine | Admitting: Internal Medicine

## 2014-09-11 VITALS — BP 130/84 | HR 82 | Temp 98.0°F | Resp 16 | Wt 244.6 lb

## 2014-09-11 DIAGNOSIS — IMO0002 Reserved for concepts with insufficient information to code with codable children: Secondary | ICD-10-CM

## 2014-09-11 DIAGNOSIS — E291 Testicular hypofunction: Secondary | ICD-10-CM | POA: Insufficient documentation

## 2014-09-11 DIAGNOSIS — H5712 Ocular pain, left eye: Secondary | ICD-10-CM | POA: Insufficient documentation

## 2014-09-11 DIAGNOSIS — R7989 Other specified abnormal findings of blood chemistry: Secondary | ICD-10-CM

## 2014-09-11 DIAGNOSIS — E1140 Type 2 diabetes mellitus with diabetic neuropathy, unspecified: Secondary | ICD-10-CM | POA: Insufficient documentation

## 2014-09-11 DIAGNOSIS — Z23 Encounter for immunization: Secondary | ICD-10-CM | POA: Insufficient documentation

## 2014-09-11 DIAGNOSIS — E1165 Type 2 diabetes mellitus with hyperglycemia: Secondary | ICD-10-CM | POA: Insufficient documentation

## 2014-09-11 LAB — GLUCOSE, POCT (MANUAL RESULT ENTRY): POC GLUCOSE: 224 mg/dL — AB (ref 70–99)

## 2014-09-11 LAB — POCT GLYCOSYLATED HEMOGLOBIN (HGB A1C): HEMOGLOBIN A1C: 9

## 2014-09-11 MED ORDER — GLIPIZIDE ER 5 MG PO TB24: 5.0000 mg | ORAL_TABLET | Freq: Every day | ORAL | Status: DC
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
## 2014-09-11 NOTE — Patient Instructions (Signed)
I have increased your dose of Glipizide to 5 mg daily. Please make sure you get the correct dose.   You will have a scrotal ultrasound next month to make sure you dont have anything obstructing blood flow  I will call you with lab results, if your testosterone is still low we may need to begin doing monthly injections

## 2014-09-11 NOTE — Progress Notes (Signed)
Patient here for follow up on his diabetes Patient complains that he is having trouble sleeping and that the viagra is Not helping his ED

## 2014-09-12 ENCOUNTER — Encounter: Payer: Self-pay | Admitting: Internal Medicine

## 2014-09-12 ENCOUNTER — Telehealth: Payer: Self-pay

## 2014-09-12 DIAGNOSIS — R7989 Other specified abnormal findings of blood chemistry: Secondary | ICD-10-CM

## 2014-09-12 LAB — TESTOSTERONE: Testosterone: 159 ng/dL — ABNORMAL LOW (ref 300–890)

## 2014-09-12 LAB — TSH: TSH: 0.802 u[IU]/mL (ref 0.350–4.500)

## 2014-09-12 MED ORDER — TESTOSTERONE CYPIONATE 200 MG/ML IM SOLN: 200.0000 mg | INTRAMUSCULAR | Status: DC

## 2014-09-12 NOTE — Telephone Encounter (Signed)
Nurse called patient, reached voicemail. Left message for patient to call [facility], at [phone]. Nurse called patient to make patient aware of [facility] and have [facility].

## 2014-09-12 NOTE — Telephone Encounter (Signed)
-----   Message from Valerie A Keck, NP sent at 09/12/2014  8:54 AM EDT ----- Please have patient to begin on testosterone injections. He will do 3 injections and then have his levels repeated. Please send Depo-testosterone 200 mg to take once per month. Please associate with low testosterone. Thanks. Also can you please set him up for a scrotal ultrasound, order has been placed. He will need that scheduled for next month when his insurance kicks in. Thanks 

## 2014-09-12 NOTE — Telephone Encounter (Signed)
Nurse called patient, reached voicemail. Left message for patient to call Tiondra Fang with [facility], at [phone].  

## 2014-09-12 NOTE — Telephone Encounter (Signed)
Nurse called patient, patient verified date of birth. Patient aware of beginning testosterone injections monthly for 3 months.  Patient will pick prescription up at pharmacy prior to visit.  Prescription sent to pharmacy. [facility]. Patient agrees to go to 1st floor Radiology and arrive at 12:45pm.  Patient transferred to front office staff to schedule nurse visit.  Patient voices understanding and has no further questions at this time.

## 2014-09-12 NOTE — Telephone Encounter (Signed)
Patient called, returning nurse's call . Please f/u

## 2014-09-17 NOTE — Telephone Encounter (Signed)
Nurse called patient, reached voicemail. Left message for patient to call Tali Cleaves with [facility], at [phone].  

## 2014-09-17 NOTE — Telephone Encounter (Signed)
-----   Message from Ambrose FinlandValerie A Keck, NP sent at 09/12/2014  8:54 AM EDT ----- Please have patient to begin on testosterone injections. He will do 3 injections and then have his levels repeated. Please send Depo-testosterone 200 mg to take once per month. Please associate with low testosterone. Thanks. Also can you please set him up for a scrotal ultrasound, order has been placed. He will need that scheduled for next month when his insurance kicks in. Thanks

## 2014-09-22 ENCOUNTER — Ambulatory Visit: Payer: Self-pay

## 2014-09-22 MED ORDER — TESTOSTERONE CYPIONATE 200 MG/ML IM SOLN: 200.0000 mg | INTRAMUSCULAR | Status: DC

## 2014-09-22 NOTE — Telephone Encounter (Signed)
Patient was unaware he needed to bring med with him to appt for testosterone injection Rx for depo-testosterone handed to patient to take to [facility] pharmacy. Appt rescheduled to receive injection

## 2014-09-24 ENCOUNTER — Telehealth: Payer: Self-pay

## 2014-09-24 NOTE — Telephone Encounter (Signed)
Nurse called patient, reached voicemail. Left message for patient to call Hardin Hardenbrook with [facility], at [phone].  

## 2014-09-24 NOTE — Telephone Encounter (Signed)
-----   Message from Valerie A Keck, NP sent at 09/12/2014  8:54 AM EDT ----- Please have patient to begin on testosterone injections. He will do 3 injections and then have his levels repeated. Please send Depo-testosterone 200 mg to take once per month. Please associate with low testosterone. Thanks. Also can you please set him up for a scrotal ultrasound, order has been placed. He will need that scheduled for next month when his insurance kicks in. Thanks 

## 2014-09-26 ENCOUNTER — Ambulatory Visit: Payer: Self-pay | Attending: Internal Medicine | Admitting: Pharmacist

## 2014-09-26 DIAGNOSIS — Z029 Encounter for administrative examinations, unspecified: Secondary | ICD-10-CM | POA: Insufficient documentation

## 2014-09-26 DIAGNOSIS — E291 Testicular hypofunction: Secondary | ICD-10-CM

## 2014-09-26 DIAGNOSIS — R7989 Other specified abnormal findings of blood chemistry: Secondary | ICD-10-CM

## 2014-09-26 MED ORDER — TESTOSTERONE CYPIONATE 200 MG/ML IM SOLN
200.0000 mg | Freq: Once | INTRAMUSCULAR | Status: AC
  Administered 2014-09-26: 200 mg via INTRAMUSCULAR

## 2014-10-13 ENCOUNTER — Ambulatory Visit (HOSPITAL_COMMUNITY): Payer: Self-pay

## 2014-10-13 NOTE — Telephone Encounter (Signed)
-----   Message from Valerie A Keck, NP sent at 09/12/2014  8:54 AM EDT ----- Please have patient to begin on testosterone injections. He will do 3 injections and then have his levels repeated. Please send Depo-testosterone 200 mg to take once per month. Please associate with low testosterone. Thanks. Also can you please set him up for a scrotal ultrasound, order has been placed. He will need that scheduled for next month when his insurance kicks in. Thanks 

## 2014-10-13 NOTE — Telephone Encounter (Signed)
Nurse called patient, reached voicemail. Left message for patient to call Tiffancy Moger with [facility], at [phone]. Patient has started testosterone injections.  Needs appointment scheduled for next injection and scrotal US set up.

## 2014-10-17 ENCOUNTER — Telehealth: Payer: Self-pay | Admitting: Internal Medicine

## 2014-10-17 NOTE — Telephone Encounter (Signed)
Pt called stating he received a call from a nurse but doesn't know the name of the person who called. Please follow up with pt.

## 2014-10-17 NOTE — Telephone Encounter (Signed)
Nurse called patient, reached voicemail. Left message for patient to call Jonathan [name] with [facility], at [phone].  

## 2014-10-24 ENCOUNTER — Telehealth: Payer: Self-pay | Admitting: Internal Medicine

## 2014-10-24 NOTE — Telephone Encounter (Signed)
Pt is needing an appt with nurse for injection (testosterone) but their are no open appointments. Please follow up with patient as it is a monthly injection and patient had his last appointment on 7/29th. Thank you.

## 2014-10-27 NOTE — Telephone Encounter (Signed)
Nurse called patient, reached voicemail. Left message for patient to call Nashaun Hillmer with [facility], at [phone]. Nurse called to schedule patients next testosterone injection.

## 2014-10-29 NOTE — Telephone Encounter (Signed)
-----   Message from Valerie A Keck, NP sent at 09/12/2014  8:54 AM EDT ----- Please have patient to begin on testosterone injections. He will do 3 injections and then have his levels repeated. Please send Depo-testosterone 200 mg to take once per month. Please associate with low testosterone. Thanks. Also can you please set him up for a scrotal ultrasound, order has been placed. He will need that scheduled for next month when his insurance kicks in. Thanks 

## 2014-10-29 NOTE — Telephone Encounter (Signed)
Nurse called patient, reached voicemail. Left message for patient to call Jonathan [name] with [facility], at [phone]. Patient needs appointment to get next testosterone injection.

## 2014-10-30 ENCOUNTER — Telehealth: Payer: Self-pay | Admitting: Internal Medicine

## 2014-10-30 NOTE — Telephone Encounter (Signed)
Patient called returning nurse's call, please f/u °

## 2014-10-30 NOTE — Telephone Encounter (Signed)
Nurse called patient, reached voicemail. Left message for patient to call Ninfa Giannelli with [facility], at [phone].  

## 2014-10-30 NOTE — Telephone Encounter (Signed)
-----   Message from Valerie A Keck, NP sent at 09/12/2014  8:54 AM EDT ----- Please have patient to begin on testosterone injections. He will do 3 injections and then have his levels repeated. Please send Depo-testosterone 200 mg to take once per month. Please associate with low testosterone. Thanks. Also can you please set him up for a scrotal ultrasound, order has been placed. He will need that scheduled for next month when his insurance kicks in. Thanks 

## 2014-10-30 NOTE — Telephone Encounter (Signed)
Patient returned nurse phone call in regards to notes from the Doctor.  Patient is available by phone until 5 pm Please follow up.

## 2014-10-31 ENCOUNTER — Ambulatory Visit: Payer: Self-pay | Attending: Internal Medicine | Admitting: *Deleted

## 2014-10-31 DIAGNOSIS — R7989 Other specified abnormal findings of blood chemistry: Secondary | ICD-10-CM

## 2014-10-31 DIAGNOSIS — E291 Testicular hypofunction: Secondary | ICD-10-CM | POA: Insufficient documentation

## 2014-10-31 MED ORDER — TESTOSTERONE CYPIONATE 200 MG/ML IM SOLN
200.0000 mg | Freq: Once | INTRAMUSCULAR | Status: AC
  Administered 2014-10-31: 200 mg via INTRAMUSCULAR

## 2014-10-31 NOTE — Progress Notes (Signed)
Patient presents for Depo Testosterone injection States feeling well Last injection received 09/26/14 Next injection due 9/30//2016

## 2014-11-28 ENCOUNTER — Ambulatory Visit: Payer: Self-pay | Attending: Internal Medicine

## 2014-11-28 VITALS — BP 117/77 | HR 83 | Temp 97.8°F | Resp 18 | Ht 77.0 in | Wt 249.0 lb

## 2014-11-28 DIAGNOSIS — E291 Testicular hypofunction: Secondary | ICD-10-CM | POA: Insufficient documentation

## 2014-11-28 DIAGNOSIS — R7989 Other specified abnormal findings of blood chemistry: Secondary | ICD-10-CM

## 2014-11-28 MED ORDER — TESTOSTERONE CYPIONATE 200 MG/ML IM SOLN
200.0000 mg | Freq: Once | INTRAMUSCULAR | Status: AC
  Administered 2014-11-28: 200 mg via INTRAMUSCULAR

## 2014-11-28 NOTE — Progress Notes (Signed)
Patient here for third testosterone injection. Patient reports feeling well today.

## 2014-12-29 ENCOUNTER — Ambulatory Visit: Payer: Self-pay | Attending: Internal Medicine | Admitting: Pharmacist

## 2014-12-29 DIAGNOSIS — R7989 Other specified abnormal findings of blood chemistry: Secondary | ICD-10-CM

## 2014-12-30 LAB — TESTOSTERONE: Testosterone: 160 ng/dL — ABNORMAL LOW (ref 300–890)

## 2015-01-02 ENCOUNTER — Telehealth: Payer: Self-pay

## 2015-01-02 ENCOUNTER — Other Ambulatory Visit: Payer: Self-pay | Admitting: Internal Medicine

## 2015-01-02 DIAGNOSIS — R7989 Other specified abnormal findings of blood chemistry: Secondary | ICD-10-CM

## 2015-01-02 DIAGNOSIS — N529 Male erectile dysfunction, unspecified: Secondary | ICD-10-CM

## 2015-01-02 NOTE — Telephone Encounter (Signed)
-----   Message from Ambrose FinlandValerie A Keck, NP sent at 01/02/2015  9:09 AM EDT ----- Please let him know that his testosterone level did not change. He will need to go to Urology because it is very likely he will need a different treatment and more test to figure out why his levels are not going up and are so low for his age.

## 2015-01-02 NOTE — Telephone Encounter (Signed)
Spoke with patient and he is aware of his lab results Patient is aware he will need to see the urologist

## 2015-01-08 ENCOUNTER — Ambulatory Visit: Payer: Self-pay | Attending: Internal Medicine | Admitting: Internal Medicine

## 2015-01-08 ENCOUNTER — Encounter: Payer: Self-pay | Admitting: Internal Medicine

## 2015-01-08 VITALS — BP 116/75 | HR 81 | Temp 98.0°F | Resp 16 | Ht 77.0 in | Wt 253.0 lb

## 2015-01-08 DIAGNOSIS — IMO0002 Reserved for concepts with insufficient information to code with codable children: Secondary | ICD-10-CM

## 2015-01-08 DIAGNOSIS — E669 Obesity, unspecified: Secondary | ICD-10-CM | POA: Insufficient documentation

## 2015-01-08 DIAGNOSIS — E1165 Type 2 diabetes mellitus with hyperglycemia: Secondary | ICD-10-CM | POA: Insufficient documentation

## 2015-01-08 DIAGNOSIS — E1140 Type 2 diabetes mellitus with diabetic neuropathy, unspecified: Secondary | ICD-10-CM | POA: Insufficient documentation

## 2015-01-08 DIAGNOSIS — Z0000 Encounter for general adult medical examination without abnormal findings: Secondary | ICD-10-CM | POA: Insufficient documentation

## 2015-01-08 DIAGNOSIS — N521 Erectile dysfunction due to diseases classified elsewhere: Secondary | ICD-10-CM | POA: Insufficient documentation

## 2015-01-08 LAB — POCT GLYCOSYLATED HEMOGLOBIN (HGB A1C): Hemoglobin A1C: 9.6

## 2015-01-08 LAB — GLUCOSE, POCT (MANUAL RESULT ENTRY): POC Glucose: 193 mg/dl — AB (ref 70–99)

## 2015-01-08 MED ORDER — METFORMIN HCL 1000 MG PO TABS: 1000.0000 mg | ORAL_TABLET | Freq: Two times a day (BID) | ORAL | Status: DC

## 2015-01-08 MED ORDER — GLIPIZIDE ER 10 MG PO TB24: 10.0000 mg | ORAL_TABLET | Freq: Every day | ORAL | Status: DC

## 2015-01-08 NOTE — Progress Notes (Signed)
Patient states he is here for his routine three month follow  Up on his diabetes

## 2015-01-08 NOTE — Progress Notes (Signed)
Patient ID: Jonathan [name], male   DOB: [birth year], 34 y.o.   MRN: [id number] SUBJECTIVE: 34 y.o. male for follow up of diabetes and low testosterone. Diabetic Review of Systems - medication compliance: compliant all of the time, diabetic diet compliance: noncompliant much of the time. Patient states that he does not have appetite and usually only eats a couple of slices of bread. His home glucose monitoring: is performed regularly, fasting values range over 200, further diabetic ROS: no polyuria or polydipsia, no chest pain, dyspnea or TIA's, no numbness, tingling or pain in extremities, no unusual visual symptoms, no hypoglycemia.  Other symptoms and concerns: still having problems with erections and not having morning erections. He does note some improvement in fatigue since receiving testosterone injections.   Current Outpatient Prescriptions  Medication Sig Dispense Refill  . gabapentin (NEURONTIN) 100 MG capsule Take 1 capsule (100 mg total) by mouth 2 (two) times daily. 60 capsule 3  . glipiZIDE (GLUCOTROL XL) 5 MG 24 hr tablet Take 1 tablet (5 mg total) by mouth daily with breakfast. 30 tablet 4  . metFORMIN (GLUCOPHAGE) 1000 MG tablet Take 1 tablet (1,000 mg total) by mouth 2 (two) times daily. 60 tablet 4  . amoxicillin (AMOXIL) 500 MG capsule Take 1 capsule (500 mg total) by mouth 3 (three) times daily. (Patient not taking: Reported on 01/08/2015) 30 capsule 0  . glucose blood test strip Use as instructed 100 each 12  . glucose monitoring kit (FREESTYLE) monitoring kit 1 each by Does not apply route as needed. 1 each 0  . Lancets (FREESTYLE) lancets Use as instructed 100 each 12  . loratadine (CLARITIN) 10 MG tablet Take 1 tablet (10 mg total) by mouth daily. (Patient not taking: Reported on 01/08/2015) 30 tablet 11  . sildenafil (VIAGRA) 25 MG tablet Take 1 tablet (25 mg total) by mouth daily as needed for erectile dysfunction. (Patient not taking: Reported on 07/02/2014) 30 tablet 3  .  testosterone cypionate (DEPO-TESTOSTERONE) 200 MG/ML injection Inject 1 mL (200 mg total) into the muscle every 28 (twenty-eight) days. (Patient not taking: Reported on 01/08/2015) 10 mL 2  . traMADol (ULTRAM) 50 MG tablet Take 1 tablet (50 mg total) by mouth every 8 (eight) hours as needed. 60 tablet 0  . [DISCONTINUED] Doxylamine Succinate, Sleep, (EQ SLEEP AID PO) Take 1-2 tablets by mouth at bedtime as needed (sleep).     No current facility-administered medications for this visit.    OBJECTIVE: Appearance: alert, well appearing, and in no distress, oriented to person, place, and time and overweight. BP 116/75 mmHg  Pulse 81  Temp(Src) 98 F (36.7 C)  Resp 16  Ht '6\' 5"'  (1.956 m)  Wt 253 lb (114.76 kg)  BMI 30.00 kg/m2  SpO2 100%  Exam: heart sounds normal rate, regular rhythm, normal S1, S2, no murmurs, rubs, clicks or gallops, no JVD, chest clear, no carotid bruits, feet: no trophic changes or ulcerative lesions, normal DP and PT pulses, normal monofilament exam and normal sensory exam  ASSESSMENT: Jonathan [name] was seen today for follow-up.  Diagnoses and all orders for this visit:  DM type 2, uncontrolled, with neuropathy (HCC) -     Glucose (CBG) -     HgB A1c -     glipiZIDE (GLUCOTROL XL) 10 MG 24 hr tablet; Take 1 tablet (10 mg total) by mouth daily with breakfast. -     metFORMIN (GLUCOPHAGE) 1000 MG tablet; Take 1 tablet (1,000 mg total) by mouth 2 (two) times daily.  Patients A1C has trended up. He has no insurance so I will increase his Glipizide to 10 mg once per day. I will have him come back in 3 weeks to apply for [employer] with [employer] and for a log review.   Erectile dysfunction due to diseases classified elsewhere I have advised patient that he will need to be seen by Urology for additional testing. Patient likely a candidate for Clomid therapy.   Obesity Patient has picked up 30 pounds since January. I have highly encouraged weight loss to help lower his A!C.  Specific diet changes addressed.   Health care maintenance -     Flu Vaccine QUAD 36+ mos PF IM (Fluarix & Fluzone Quad PF)  Return in about 3 weeks (around 01/29/2015) for RN-log review---Invok[employer] app and 3 mo PCP , Diabetes Mellitus.   Lance Bosch, NP 01/08/2015 12:01 PM

## 2015-01-08 NOTE — Patient Instructions (Signed)
I have increased your Glipizide to 10 mg once per day. Please get [facility] discount so that I may place referral to Urology

## 2015-01-28 ENCOUNTER — Encounter: Payer: Self-pay | Admitting: Pharmacist

## 2015-02-13 ENCOUNTER — Ambulatory Visit: Payer: Self-pay

## 2015-03-04 MED FILL — GABAPENTIN 100 MG CAPSULE: 100 | 30 days supply | Qty: 60 | Fill #3

## 2015-03-04 MED FILL — metFORMIN HCL 1000 MG TABS: 1000 | 30 days supply | Qty: 60 | Fill #3

## 2015-04-01 ENCOUNTER — Emergency Department (HOSPITAL_COMMUNITY)
Admission: EM | Admit: 2015-04-01 | Discharge: 2015-04-01 | Disposition: A | Discharge status: Home / Self Care | Payer: Self-pay | Attending: Emergency Medicine | Admitting: Emergency Medicine

## 2015-04-01 ENCOUNTER — Encounter (HOSPITAL_COMMUNITY): Payer: Self-pay | Admitting: Emergency Medicine

## 2015-04-01 DIAGNOSIS — K029 Dental caries, unspecified: Secondary | ICD-10-CM

## 2015-04-01 DIAGNOSIS — Z79899 Other long term (current) drug therapy: Secondary | ICD-10-CM | POA: Insufficient documentation

## 2015-04-01 DIAGNOSIS — Z7984 Long term (current) use of oral hypoglycemic drugs: Secondary | ICD-10-CM | POA: Insufficient documentation

## 2015-04-01 DIAGNOSIS — E119 Type 2 diabetes mellitus without complications: Secondary | ICD-10-CM | POA: Insufficient documentation

## 2015-04-01 MED ORDER — IBUPROFEN 800 MG PO TABS: 800.0000 mg | ORAL_TABLET | Freq: Three times a day (TID) | ORAL | Status: DC

## 2015-04-01 MED ORDER — PENICILLIN V POTASSIUM 500 MG PO TABS: 500.0000 mg | ORAL_TABLET | Freq: Three times a day (TID) | ORAL | Status: DC

## 2015-04-01 MED FILL — ?PENICILLIN VK 500 MG TABLE: 500 | 10 days supply | Qty: 30 | Fill #0

## 2015-04-01 MED FILL — IBUPROFEN 800 MG TABLET: 800 | 7 days supply | Qty: 21 | Fill #0

## 2015-04-01 NOTE — ED Notes (Signed)
See PA assessment 

## 2015-04-01 NOTE — ED Notes (Signed)
Patient states L upper jaw/teeth pain x 4 weeks.   Patient states pain is intermittent.  Patient states no dentist.  Denies other symptoms.  Patient states has been using ibuprofen at home, but it is not working as well as it was initially.

## 2015-04-01 NOTE — Discharge Instructions (Signed)

## 2015-04-01 NOTE — ED Provider Notes (Signed)
CSN: [id number]     Arrival date & time 04/01/15  1124 History  By signing my name below, I, Evelene Croon, attest that this documentation has been prepared under the direction and in the presence of non-physician practitioner, Domenic Moras, PA-C. Electronically Signed: Evelene Croon, Scribe. 04/01/2015. 11:45 AM.    Chief Complaint  Patient presents with  . Dental Pain    The history is provided by the patient. No language interpreter was used.     HPI Comments:  Jonathan [name] is a 35 y.o. male who presents to the Emergency Department complaining of 8/10 left upper dental pain intermittently x 4 weeks, returned 4 days ago. His pain is exacerbated when chewing.  Pt has taken ibuprofen with moderate improvement in the beginning but none in recent days. He denies fever, rhinorrhea, sneezing and cough.   Past Medical History  Diagnosis Date  . Diabetes mellitus    History reviewed. No pertinent past surgical history. Family History  Problem Relation Age of Onset  . Family history unknown: Yes   Social History  Substance Use Topics  . Smoking status: Never Smoker   . Smokeless tobacco: None  . Alcohol Use: Yes     Comment: occassional    Review of Systems  Constitutional: Negative for fever.  HENT: Positive for dental problem. Negative for rhinorrhea and sneezing.   Respiratory: Negative for cough.     Allergies  Review of patient's allergies indicates no known allergies.  Home Medications   Prior to Admission medications   Medication Sig Start Date End Date Taking? Authorizing Provider  glipiZIDE (GLUCOTROL XL) 10 MG 24 hr tablet Take 1 tablet (10 mg total) by mouth daily with breakfast. 01/08/15  Yes Lance Bosch, NP  glucose blood test strip Use as instructed 06/12/14  Yes Lance Bosch, NP  glucose monitoring kit (FREESTYLE) monitoring kit 1 each by Does not apply route as needed. 06/12/14  Yes Lance Bosch, NP  Lancets (FREESTYLE) lancets Use as instructed 06/12/14   Yes Lance Bosch, NP  metFORMIN (GLUCOPHAGE) 1000 MG tablet Take 1 tablet (1,000 mg total) by mouth 2 (two) times daily. 01/08/15  Yes Lance Bosch, NP  gabapentin (NEURONTIN) 100 MG capsule Take 1 capsule (100 mg total) by mouth 2 (two) times daily. 06/12/14   Lance Bosch, NP  ibuprofen (ADVIL,MOTRIN) 800 MG tablet Take 1 tablet (800 mg total) by mouth 3 (three) times daily. 04/01/15   Domenic Moras, PA-C  loratadine (CLARITIN) 10 MG tablet Take 1 tablet (10 mg total) by mouth daily. Patient not taking: Reported on 01/08/2015 07/02/14   Lance Bosch, NP  penicillin v potassium (VEETID) 500 MG tablet Take 1 tablet (500 mg total) by mouth 3 (three) times daily. 04/01/15   Domenic Moras, PA-C  sildenafil (VIAGRA) 25 MG tablet Take 1 tablet (25 mg total) by mouth daily as needed for erectile dysfunction. Patient not taking: Reported on 07/02/2014 06/27/14   Tresa Garter, MD  testosterone cypionate (DEPO-TESTOSTERONE) 200 MG/ML injection Inject 1 mL (200 mg total) into the muscle every 28 (twenty-eight) days. Patient not taking: Reported on 01/08/2015 09/22/14   Lance Bosch, NP   BP 143/90 mmHg  Pulse 82  Temp(Src) 97.9 F (36.6 C) (Oral)  Resp 16  Ht _0  (1.981 m)  Wt 242 lb (109.77 kg)  BMI 27.97 kg/m2  SpO2 100% Physical Exam  Constitutional: He is oriented to person, place, and time. He appears well-developed and well-nourished.  No distress.  HENT:  Head: Normocephalic and atraumatic.  Mouth/Throat: Uvula is midline and oropharynx is clear and moist. No trismus in the jaw. No posterior oropharyngeal erythema.  Tenderness to tooth # 15 and #16; dental decay noted. No obvious abscess. No trimus   Eyes: Conjunctivae are normal.  Cardiovascular: Normal rate.   Pulmonary/Chest: Effort normal.  Abdominal: He exhibits no distension.  Neurological: He is alert and oriented to person, place, and time.  Skin: Skin is warm and dry.  Psychiatric: He has a normal mood and affect.  Nursing  note and vitals reviewed.   ED Course  Procedures   DIAGNOSTIC STUDIES:  Oxygen Saturation is 100% on RA, normal by my interpretation.    COORDINATION OF CARE:  11:33 AM Will discharge with antibiotic, anti-inflammatory and dental referral. Discussed treatment plan with pt at bedside and pt agreed to plan.    MDM   Final diagnoses:  Pain due to dental caries    BP 143/90 mmHg  Pulse 82  Temp(Src) 97.9 F (36.6 C) (Oral)  Resp 16  Ht _0  (1.981 m)  Wt 109.77 kg  BMI 27.97 kg/m2  SpO2 100%   Patient with dentalgia.  No abscess requiring immediate incision and drainage.  Exam not concerning for Ludwig's angina or pharyngeal abscess.  Will treat with ibuprofen and penicillin. Pt instructed to follow-up with dentist.  Discussed return precautions. Pt safe for discharge.   I personally performed the services described in this documentation, which was scribed in my presence. The recorded information has been reviewed and is accurate.      Domenic Moras, PA-C 04/01/15 [place], MD 04/03/15 2231

## 2015-04-09 ENCOUNTER — Ambulatory Visit: Payer: Self-pay | Admitting: Internal Medicine

## 2015-04-16 ENCOUNTER — Ambulatory Visit: Payer: Self-pay | Admitting: Internal Medicine

## 2015-04-22 ENCOUNTER — Ambulatory Visit: Payer: Self-pay | Admitting: Internal Medicine

## 2015-04-30 ENCOUNTER — Other Ambulatory Visit: Payer: Self-pay | Admitting: Internal Medicine

## 2015-05-22 MED FILL — metFORMIN HCL 1000 MG TABS: 1000 | 30 days supply | Qty: 60 | Fill #4

## 2015-08-05 ENCOUNTER — Encounter: Payer: Self-pay | Admitting: Family Medicine

## 2015-08-05 ENCOUNTER — Ambulatory Visit: Payer: Self-pay | Attending: Family Medicine | Admitting: Family Medicine

## 2015-08-05 VITALS — BP 115/77 | HR 95 | Temp 98.0°F | Resp 18 | Ht 79.0 in | Wt 248.6 lb

## 2015-08-05 DIAGNOSIS — Z9119 Patient's noncompliance with other medical treatment and regimen: Secondary | ICD-10-CM | POA: Insufficient documentation

## 2015-08-05 DIAGNOSIS — M545 Low back pain, unspecified: Secondary | ICD-10-CM

## 2015-08-05 DIAGNOSIS — IMO0002 Reserved for concepts with insufficient information to code with codable children: Secondary | ICD-10-CM

## 2015-08-05 DIAGNOSIS — E291 Testicular hypofunction: Secondary | ICD-10-CM

## 2015-08-05 DIAGNOSIS — Z7984 Long term (current) use of oral hypoglycemic drugs: Secondary | ICD-10-CM | POA: Insufficient documentation

## 2015-08-05 DIAGNOSIS — E1165 Type 2 diabetes mellitus with hyperglycemia: Secondary | ICD-10-CM | POA: Insufficient documentation

## 2015-08-05 DIAGNOSIS — N529 Male erectile dysfunction, unspecified: Secondary | ICD-10-CM | POA: Insufficient documentation

## 2015-08-05 DIAGNOSIS — E1140 Type 2 diabetes mellitus with diabetic neuropathy, unspecified: Secondary | ICD-10-CM

## 2015-08-05 DIAGNOSIS — Z91199 Patient's noncompliance with other medical treatment and regimen due to unspecified reason: Secondary | ICD-10-CM

## 2015-08-05 DIAGNOSIS — Z79899 Other long term (current) drug therapy: Secondary | ICD-10-CM | POA: Insufficient documentation

## 2015-08-05 DIAGNOSIS — R7989 Other specified abnormal findings of blood chemistry: Secondary | ICD-10-CM

## 2015-08-05 LAB — POCT URINALYSIS DIPSTICK
Bilirubin, UA: NEGATIVE
Blood, UA: NEGATIVE
Glucose, UA: 500
Leukocytes, UA: NEGATIVE
Nitrite, UA: NEGATIVE
Protein, UA: NEGATIVE
Urobilinogen, UA: 0.2
pH, UA: 5.5

## 2015-08-05 LAB — POCT GLYCOSYLATED HEMOGLOBIN (HGB A1C): Hemoglobin A1C: 14.5

## 2015-08-05 LAB — GLUCOSE, POCT (MANUAL RESULT ENTRY)
POC Glucose: 580 mg/dl — AB (ref 70–99)
POC Glucose: 570 mg/dl — AB (ref 70–99)
POC Glucose: 522 mg/dl — AB (ref 70–99)

## 2015-08-05 MED ORDER — METFORMIN HCL 1000 MG PO TABS: 1000.0000 mg | ORAL_TABLET | Freq: Two times a day (BID) | ORAL | Status: DC

## 2015-08-05 MED ORDER — CYCLOBENZAPRINE HCL 10 MG PO TABS: 10.0000 mg | ORAL_TABLET | Freq: Two times a day (BID) | ORAL | Status: DC | PRN

## 2015-08-05 MED ORDER — INSULIN ASPART 100 UNIT/ML ~~LOC~~ SOLN
20.0000 [IU] | Freq: Once | SUBCUTANEOUS | Status: AC
  Administered 2015-08-05: 20 [IU] via SUBCUTANEOUS

## 2015-08-05 MED ORDER — GLIPIZIDE ER 10 MG PO TB24: 10.0000 mg | ORAL_TABLET | Freq: Every day | ORAL | Status: DC

## 2015-08-05 MED ORDER — GABAPENTIN 100 MG PO CAPS: 100.0000 mg | ORAL_CAPSULE | Freq: Two times a day (BID) | ORAL | Status: DC

## 2015-08-05 MED FILL — GABAPENTIN 100 MG CAPSULE: 100 | 30 days supply | Qty: 60 | Fill #0

## 2015-08-05 MED FILL — ?METFORMIN HCL 1,000 MG TAB: 1000 | 30 days supply | Qty: 60 | Fill #0

## 2015-08-05 MED FILL — CYCLOBENZAPRINE 10 MG TAB: 10 | 30 days supply | Qty: 60 | Fill #0

## 2015-08-05 MED FILL — glipiZIDE ER 10 MG TB24: 10 | 30 days supply | Qty: 30 | Fill #0

## 2015-08-05 NOTE — Patient Instructions (Signed)
Erectile Dysfunction  Erectile dysfunction is the inability to get or sustain a good enough erection to have sexual intercourse. Erectile dysfunction may involve:   Inability to get an erection.   Lack of enough hardness to allow penetration.   Loss of the erection before sex is finished.   Premature ejaculation.  CAUSES   Certain drugs, such as:    Pain relievers.    Antihistamines.    Antidepressants.    Blood pressure medicines.    Water pills (diuretics).    Ulcer medicines.    Muscle relaxants.    Illegal drugs.   Excessive drinking.   Psychological causes, such as:    Anxiety.    Depression.    Sadness.    Exhaustion.    Performance fear.    Stress.   Physical causes, such as:    Artery problems. This may include diabetes, smoking, liver disease, or atherosclerosis.    High blood pressure.    Hormonal problems, such as low testosterone.    Obesity.    Nerve problems. This may include back or pelvic injuries, diabetes mellitus, multiple sclerosis, or Parkinson disease.  SYMPTOMS   Inability to get an erection.   Lack of enough hardness to allow penetration.   Loss of the erection before sex is finished.   Premature ejaculation.   Normal erections at some times, but with frequent unsatisfactory episodes.   Orgasms that are not satisfactory in sensation or frequency.   Low sexual satisfaction in either partner because of erection problems.   A curved penis occurring with erection. The curve may cause pain or may be too curved to allow for intercourse.   Never having nighttime erections.  DIAGNOSIS  Your caregiver can often diagnose this condition by:   Performing a physical exam to find other diseases or specific problems with the penis.   Asking you detailed questions about the problem.   Performing blood tests to check for diabetes mellitus or to measure hormone levels.   Performing urine tests to find other underlying health conditions.   Performing an ultrasound exam to check for  scarring.   Performing a test to check blood flow to the penis.   Doing a sleep study at home to measure nighttime erections.  TREATMENT    You may be prescribed medicines by mouth.   You may be given medicine injections into the penis.   You may be prescribed a vacuum pump with a ring.   Penile implant surgery may be performed. You may receive:    An inflatable implant.    A semirigid implant.   Blood vessel surgery may be performed.  HOME CARE INSTRUCTIONS   If you are prescribed oral medicine, you should take the medicine as prescribed. Do not increase the dosage without first discussing it with your physician.   If you are using self-injections, be careful to avoid any veins that are on the surface of the penis. Apply pressure to the injection site for 5 minutes.   If you are using a vacuum pump, make sure you have read the instructions before using it. Discuss any questions with your physician before taking the pump home.  SEEK MEDICAL CARE IF:   You experience pain that is not responsive to the pain medicine you have been prescribed.   You experience nausea or vomiting.  SEEK IMMEDIATE MEDICAL CARE IF:    When taking oral or injectable medications, you experience an erection that lasts longer than 4 hours. If your   physician is unavailable, go to the nearest emergency room for evaluation. An erection that lasts much longer than 4 hours can result in permanent damage to your penis.   You have pain that is severe.   You develop redness, severe pain, or severe swelling of your penis.   You have redness spreading up into your groin or lower abdomen.   You are unable to pass your urine.     This information is not intended to replace advice given to you by your health care provider. Make sure you discuss any questions you have with your health care provider.     Document Released: 02/12/2000 Document Revised: 10/17/2012 Document Reviewed: 07/19/2012  Elsevier Interactive Patient Education 2016  [employer]

## 2015-08-05 NOTE — Progress Notes (Signed)
Pt here for DM. Pt reports lower back pain rated at an 8. Pain started 2 weeks ago after helping a family member move. Pain described as aching and it comes at night. Pt has taken aleve for the pain without any relief. Pt CBG is 580 and A1C is 14.5. Pt ate pancakes this morning. Pt has not taken any medications today. Pt reports he has been out of glipizide and metformin for a month now and needs a refill.

## 2015-08-06 ENCOUNTER — Ambulatory Visit: Payer: Self-pay | Attending: Internal Medicine

## 2015-08-06 DIAGNOSIS — E1165 Type 2 diabetes mellitus with hyperglycemia: Principal | ICD-10-CM

## 2015-08-06 DIAGNOSIS — N529 Male erectile dysfunction, unspecified: Secondary | ICD-10-CM

## 2015-08-06 DIAGNOSIS — E1140 Type 2 diabetes mellitus with diabetic neuropathy, unspecified: Secondary | ICD-10-CM

## 2015-08-06 DIAGNOSIS — IMO0002 Reserved for concepts with insufficient information to code with codable children: Secondary | ICD-10-CM

## 2015-08-06 LAB — LIPID PANEL
Cholesterol: 141 mg/dL (ref 125–200)
HDL: 47 mg/dL (ref >=40)
LDL Cholesterol: 72 mg/dL (ref <130)
TRIGLYCERIDES: 109 mg/dL (ref <150)
Total CHOL/HDL Ratio: 3 Ratio (ref <=5.0)
VLDL: 22 mg/dL (ref <30)

## 2015-08-06 LAB — COMPLETE METABOLIC PANEL WITH GFR
ALBUMIN: 4.1 g/dL (ref 3.6–5.1)
ALK PHOS: 82 U/L (ref 40–115)
ALT: 17 U/L (ref 9–46)
AST: 14 U/L (ref 10–40)
BUN: 10 mg/dL (ref 7–25)
CO2: 27 mmol/L (ref 20–31)
CREATININE: 0.89 mg/dL (ref 0.60–1.35)
Calcium: 9.6 mg/dL (ref 8.6–10.3)
Chloride: 100 mmol/L (ref 98–110)
Glucose, Bld: 368 mg/dL — ABNORMAL HIGH (ref 65–99)
Potassium: 4.3 mmol/L (ref 3.5–5.3)
Sodium: 137 mmol/L (ref 135–146)
TOTAL PROTEIN: 7.1 g/dL (ref 6.1–8.1)
Total Bilirubin: 0.5 mg/dL (ref 0.2–1.2)

## 2015-08-06 LAB — MICROALBUMIN / CREATININE URINE RATIO
Creatinine, Urine: 32 mg/dL (ref 20–370)
Microalb, Ur: <0.2 mg/dL

## 2015-08-06 LAB — TESTOSTERONE TOTAL,FREE,BIO, MALES
Albumin: 4.1 g/dL (ref 3.6–5.1)
Sex Hormone Binding: 5 nmol/L — ABNORMAL LOW (ref 10–50)
TESTOSTERONE: 144 ng/dL — AB (ref 250–827)
Testosterone, Bioavailable: 99.4 ng/dL — ABNORMAL LOW (ref 130.5–681.7)
Testosterone, Free: 52.8 pg/mL (ref 47.0–244.0)

## 2015-08-06 NOTE — Progress Notes (Signed)
Subjective:  Patient ID: Jonathan [name], male    DOB: 12/01/1980  Age: 35 y.o. MRN: [id number]  CC: Diabetes   HPI Jonathan [name] is a 35 year old male with a history of type 2 diabetes mellitus (A1c 14.5), erectile dysfunction, testosterone deficiency who presents to establish care with me; he was previously followed by the nurse practitioner who is no longer with practice.  He has been out of his medications for some time but has not been checking his sugars and blood sugar today is 580 in the clinic. He was previously on lisinopril which he stopped due to cough.  He complains of low back pain for the last 2 weeks when he helped a family member move some heavy furniture. Pain is 8/10 and does not radiate down his lower extremities; he denies numbness in his legs or weakness in normal loss of sphincteric function. Use Advil with minimal relief in symptoms.  Also complains of erectile dysfunction following last 1 year and inability to ejaculate; prior to that he had been normal. He has used Viagra and was on testosterone shots with his previous PCP but nothing seemed to help. He denies recent infections or exposure to radiation.  Outpatient Prescriptions Prior to Visit  Medication Sig Dispense Refill  . glucose blood test strip Use as instructed 100 each 12  . glucose monitoring kit (FREESTYLE) monitoring kit 1 each by Does not apply route as needed. 1 each 0  . ibuprofen (ADVIL,MOTRIN) 800 MG tablet Take 1 tablet (800 mg total) by mouth 3 (three) times daily. 21 tablet 0  . Lancets (FREESTYLE) lancets Use as instructed 100 each 12  . glipiZIDE (GLUCOTROL XL) 10 MG 24 hr tablet Take 1 tablet (10 mg total) by mouth daily with breakfast. 30 tablet 5  . metFORMIN (GLUCOPHAGE) 1000 MG tablet Take 1 tablet (1,000 mg total) by mouth 2 (two) times daily. 60 tablet 5  . sildenafil (VIAGRA) 25 MG tablet Take 1 tablet (25 mg total) by mouth daily as needed for erectile dysfunction. (Patient not  taking: Reported on 07/02/2014) 30 tablet 3  . testosterone cypionate (DEPO-TESTOSTERONE) 200 MG/ML injection Inject 1 mL (200 mg total) into the muscle every 28 (twenty-eight) days. (Patient not taking: Reported on 01/08/2015) 10 mL 2  . gabapentin (NEURONTIN) 100 MG capsule Take 1 capsule (100 mg total) by mouth 2 (two) times daily. Needs office visit for refills (Patient not taking: Reported on 08/05/2015) 60 capsule 0  . loratadine (CLARITIN) 10 MG tablet Take 1 tablet (10 mg total) by mouth daily. (Patient not taking: Reported on 01/08/2015) 30 tablet 11  . penicillin v potassium (VEETID) 500 MG tablet Take 1 tablet (500 mg total) by mouth 3 (three) times daily. (Patient not taking: Reported on 08/05/2015) 30 tablet 0   No facility-administered medications prior to visit.    ROS Review of Systems  Constitutional: Negative for activity change and appetite change.  HENT: Negative for sinus pressure and sore throat.   Eyes: Negative for visual disturbance.  Respiratory: Negative for cough, chest tightness, shortness of breath and wheezing.   Cardiovascular: Negative for chest pain, palpitations and leg swelling.  Gastrointestinal: Negative for abdominal pain, diarrhea, constipation and abdominal distention.  Endocrine: Negative.   Genitourinary: Negative.  Negative for dysuria.  Musculoskeletal: Positive for back pain. Negative for myalgias and joint swelling.  Skin: Negative for rash.  Allergic/Immunologic: Negative.   Neurological: Negative for weakness, light-headedness and numbness.  Psychiatric/Behavioral: Negative for suicidal ideas, behavioral problems and dysphoric  mood.    Objective:  BP 115/77 mmHg  Pulse 95  Temp(Src) 98 F (36.7 C) (Oral)  Resp 18  Ht '6\' 7"'  (2.007 m)  Wt 248 lb 9.6 oz (112.764 kg)  BMI 27.99 kg/m2  SpO2 99%  BP/Weight 08/05/2015 04/01/2015 38/88/2800  Systolic BP 349 179 150  Diastolic BP 77 90 75  Wt. (Lbs) 248.6 242 253  BMI 27.99 27.97 30       Physical Exam  Constitutional: He is oriented to person, place, and time. He appears well-developed and well-nourished.  Cardiovascular: Normal rate, normal heart sounds and intact distal pulses.   No murmur heard. Pulmonary/Chest: Effort normal and breath sounds normal. He has no wheezes. He has no rales. He exhibits no tenderness.  Abdominal: Soft. Bowel sounds are normal. He exhibits no distension and no mass. There is no tenderness.  Musculoskeletal: Normal range of motion. He exhibits tenderness (mild tenderness to palpation of bilateral paraspinal muscles and midline lumbar region).  Neurological: He is alert and oriented to person, place, and time.     Assessment & Plan:   1. DM type 2, uncontrolled, with neuropathy ([place]) Uncontrolled with A1c of 14.5 largely due to noncompliance and dietary indiscretion NovoLog 20 units administered due to hyperglycemia with CBG of 580 and patient hydrated orally and observed for one and half hours with Sugars trending down slowly Will review blood sugar log for next visit and make appropriate regimen changes - metFORMIN (GLUCOPHAGE) 1000 MG tablet; Take 1 tablet (1,000 mg total) by mouth 2 (two) times daily.  Dispense: 60 tablet; Refill: 3 - glipiZIDE (GLUCOTROL XL) 10 MG 24 hr tablet; Take 1 tablet (10 mg total) by mouth daily with breakfast.  Dispense: 30 tablet; Refill: 3 - gabapentin (NEURONTIN) 100 MG capsule; Take 1 capsule (100 mg total) by mouth 2 (two) times daily.  Dispense: 60 capsule; Refill: 3 - Glucose (CBG) - HgB A1c - Urinalysis Dipstick - Microalbumin / creatinine urine ratio - insulin aspart (novoLOG) injection 20 Units; Inject 0.2 mLs (20 Units total) into the skin once. - Lipid panel; Future - COMPLETE METABOLIC PANEL WITH GFR; Future - Glucose (CBG) - Glucose (CBG)  2. Noncompliance Strongly encourage compliance. Discussed implications of noncompliance with diabetes complications.  3. Low  testosterone Previously on testosterone replacement - Testosterone Total,Free,Bio, Males  4. Erectile dysfunction, unspecified erectile dysfunction type With young age and given he has not responded well to testosterone injections and Viagra in the past he may need to see a urologist. Underlying uncontrolled diabetes also contributory. Encouraged to apply for the [facility] card to aid this referral process. - FSH/LH; Future  5. Midline low back pain without sciatica Advised to apply heat. Continue Advil. Educated about sedating side effects of Flexeril and advised to avoid driving while using this. - cyclobenzaprine (FLEXERIL) 10 MG tablet; Take 1 tablet (10 mg total) by mouth every 12 (twelve) hours as needed for muscle spasms.  Dispense: 60 tablet; Refill: 1   Meds ordered this encounter  Medications  . metFORMIN (GLUCOPHAGE) 1000 MG tablet    Sig: Take 1 tablet (1,000 mg total) by mouth 2 (two) times daily.    Dispense:  60 tablet    Refill:  3  . glipiZIDE (GLUCOTROL XL) 10 MG 24 hr tablet    Sig: Take 1 tablet (10 mg total) by mouth daily with breakfast.    Dispense:  30 tablet    Refill:  3  . gabapentin (NEURONTIN) 100 MG capsule  Sig: Take 1 capsule (100 mg total) by mouth 2 (two) times daily.    Dispense:  60 capsule    Refill:  3  . insulin aspart (novoLOG) injection 20 Units    Sig:   . cyclobenzaprine (FLEXERIL) 10 MG tablet    Sig: Take 1 tablet (10 mg total) by mouth every 12 (twelve) hours as needed for muscle spasms.    Dispense:  60 tablet    Refill:  1    Follow-up: Return in about 3 weeks (around 08/26/2015) for Follow up on diabetes mellitus.   Arnoldo Morale MD

## 2015-08-07 LAB — FSH/LH
FSH: 5.4 m[IU]/mL (ref 1.6–8.0)
LH: 6.1 m[IU]/mL (ref 1.5–9.3)

## 2015-08-07 NOTE — Progress Notes (Signed)
Patient was advised of lab results. He voiced understanding.

## 2015-10-02 MED FILL — ?METFORMIN HCL 1,000 MG TAB: 1000 | 30 days supply | Qty: 60 | Fill #1

## 2015-10-02 MED FILL — GABAPENTIN 100 MG CAPSULE: 100 | 30 days supply | Qty: 60 | Fill #1

## 2015-10-07 MED FILL — glipiZIDE ER 10 MG TB24: 10 | 30 days supply | Qty: 30 | Fill #1

## 2015-10-16 ENCOUNTER — Ambulatory Visit: Payer: Self-pay | Attending: Family Medicine | Admitting: Family Medicine

## 2015-10-16 ENCOUNTER — Encounter: Payer: Self-pay | Admitting: Family Medicine

## 2015-10-16 VITALS — BP 107/75 | HR 88 | Temp 98.8°F | Ht >= 80 in | Wt 258.8 lb

## 2015-10-16 DIAGNOSIS — E1165 Type 2 diabetes mellitus with hyperglycemia: Secondary | ICD-10-CM

## 2015-10-16 DIAGNOSIS — E1149 Type 2 diabetes mellitus with other diabetic neurological complication: Secondary | ICD-10-CM

## 2015-10-16 DIAGNOSIS — E1140 Type 2 diabetes mellitus with diabetic neuropathy, unspecified: Secondary | ICD-10-CM | POA: Insufficient documentation

## 2015-10-16 DIAGNOSIS — E291 Testicular hypofunction: Secondary | ICD-10-CM | POA: Insufficient documentation

## 2015-10-16 DIAGNOSIS — IMO0002 Reserved for concepts with insufficient information to code with codable children: Secondary | ICD-10-CM

## 2015-10-16 DIAGNOSIS — R7989 Other specified abnormal findings of blood chemistry: Secondary | ICD-10-CM

## 2015-10-16 DIAGNOSIS — Z7984 Long term (current) use of oral hypoglycemic drugs: Secondary | ICD-10-CM | POA: Insufficient documentation

## 2015-10-16 DIAGNOSIS — N529 Male erectile dysfunction, unspecified: Secondary | ICD-10-CM | POA: Insufficient documentation

## 2015-10-16 LAB — GLUCOSE, POCT (MANUAL RESULT ENTRY): POC GLUCOSE: 279 mg/dL — AB (ref 70–99)

## 2015-10-16 MED ORDER — GABAPENTIN 300 MG PO CAPS: 300.0000 mg | ORAL_CAPSULE | Freq: Two times a day (BID) | ORAL | 3 refills | Status: DC

## 2015-10-16 MED ORDER — TESTOSTERONE CYPIONATE 200 MG/ML IM SOLN: 400.0000 mg | INTRAMUSCULAR | 2 refills | Status: DC

## 2015-10-16 NOTE — Progress Notes (Signed)
Subjective:  Patient ID: Jonathan [name], male    DOB: 1980-11-16  Age: 35 y.o. MRN: [id number]  CC: Diabetes   HPI Jonathan [name] is a 35 year old male with a history of type 2 diabetes mellitus (A1c 14.5 from 07/2015), diabetic neuropathy, erectile dysfunction and testosterone deficiency who comes into the clinic for a follow-up visit.  He has been compliant with his glipizide, metformin and gabapentin. He however complains that his diabetic neuropathy is not controlled on his current dose of gabapentin. He complains of erectile dysfunction and tried testosterone injections and Viagra last year which did not help. Labs revealed testosterone deficiency, normal LH and FSH and the plan was to refer him to a urologist however he has no medical coverage. He informs me he had made an appointment with urology and was going to pay for the $250 requested but was informed they would not take a credit card but only Cash. He is really concerned about his erectile dysfunction and this really makes him sad.  Past Medical History:  Diagnosis Date  . Diabetes mellitus     History reviewed. No pertinent surgical history.  No Known Allergies    Outpatient Medications Prior to Visit  Medication Sig Dispense Refill  . glipiZIDE (GLUCOTROL XL) 10 MG 24 hr tablet Take 1 tablet (10 mg total) by mouth daily with breakfast. 30 tablet 3  . glucose blood test strip Use as instructed 100 each 12  . glucose monitoring kit (FREESTYLE) monitoring kit 1 each by Does not apply route as needed. 1 each 0  . Lancets (FREESTYLE) lancets Use as instructed 100 each 12  . metFORMIN (GLUCOPHAGE) 1000 MG tablet Take 1 tablet (1,000 mg total) by mouth 2 (two) times daily. 60 tablet 3  . gabapentin (NEURONTIN) 100 MG capsule Take 1 capsule (100 mg total) by mouth 2 (two) times daily. 60 capsule 3  . testosterone cypionate (DEPO-TESTOSTERONE) 200 MG/ML injection Inject 1 mL (200 mg total) into the muscle every 28  (twenty-eight) days. 10 mL 2  . cyclobenzaprine (FLEXERIL) 10 MG tablet Take 1 tablet (10 mg total) by mouth every 12 (twelve) hours as needed for muscle spasms. (Patient not taking: Reported on 10/16/2015) 60 tablet 1  . ibuprofen (ADVIL,MOTRIN) 800 MG tablet Take 1 tablet (800 mg total) by mouth 3 (three) times daily. (Patient not taking: Reported on 10/16/2015) 21 tablet 0  . sildenafil (VIAGRA) 25 MG tablet Take 1 tablet (25 mg total) by mouth daily as needed for erectile dysfunction. (Patient not taking: Reported on 07/02/2014) 30 tablet 3   No facility-administered medications prior to visit.     ROS Review of Systems  Constitutional: Negative for activity change and appetite change.  HENT: Negative for sinus pressure and sore throat.   Eyes: Negative for visual disturbance.  Respiratory: Negative for cough, chest tightness and shortness of breath.   Cardiovascular: Negative for chest pain and leg swelling.  Gastrointestinal: Negative for abdominal distention, abdominal pain, constipation and diarrhea.  Endocrine: Negative.   Genitourinary:       See hpi  Musculoskeletal: Negative for joint swelling and myalgias.  Skin: Negative for rash.  Allergic/Immunologic: Negative.   Neurological: Negative for weakness, light-headedness and numbness.  Psychiatric/Behavioral: Negative for dysphoric mood and suicidal ideas.    Objective:  BP 107/75 (BP Location: Right Arm, Patient Position: Sitting, Cuff Size: Large)   Pulse 88   Temp 98.8 F (37.1 C) (Oral)   Ht '6\' 8"'  (2.032 m)   Wt 258 lb  12.8 oz (117.4 kg)   SpO2 98%   BMI 28.43 kg/m   BP/Weight 10/16/2015 0/10/8117 03/04/7827  Systolic BP 562 130 865  Diastolic BP 75 77 90  Wt. (Lbs) 258.8 248.6 242  BMI 28.43 27.99 27.97      Physical Exam  Constitutional: He is oriented to person, place, and time. He appears well-developed and well-nourished.  Cardiovascular: Normal rate, normal heart sounds and intact distal pulses.   No  murmur heard. Pulmonary/Chest: Effort normal and breath sounds normal. He has no wheezes. He has no rales. He exhibits no tenderness.  Abdominal: Soft. Bowel sounds are normal. He exhibits no distension and no mass. There is no tenderness.  Musculoskeletal: Normal range of motion.  Neurological: He is alert and oriented to person, place, and time.     CMP Latest Ref Rng & Units 08/06/2015 06/12/2014 01/05/2014  Glucose 65 - 99 mg/dL 368(H) 217(H) 400(H)  BUN 7 - 25 mg/dL '10 8 13  ' Creatinine 0.60 - 1.35 mg/dL 0.89 0.79 0.91  Sodium 135 - 146 mmol/L 137 137 136(L)  Potassium 3.5 - 5.3 mmol/L 4.3 4.8 4.3  Chloride 98 - 110 mmol/L 100 102 99  CO2 20 - 31 mmol/L '27 30 25  ' Calcium 8.6 - 10.3 mg/dL 9.6 9.8 9.3  Total Protein 6.1 - 8.1 g/dL 7.1 7.0 -  Total Bilirubin 0.2 - 1.2 mg/dL 0.5 0.5 -  Alkaline Phos 40 - 115 U/L 82 67 -  AST 10 - 40 U/L 14 16 -  ALT 9 - 46 U/L 17 26 -    Lipid Panel     Component Value Date/Time   CHOL 141 08/06/2015 0932   TRIG 109 08/06/2015 0932   HDL 47 08/06/2015 0932   CHOLHDL 3.0 08/06/2015 0932   VLDL 22 08/06/2015 0932   LDLCALC 72 08/06/2015 0932     Assessment & Plan:   1. DM type 2, uncontrolled, with neuropathy ([facility]) A1c of 14.5 Blood sugar logs revealed improvement Continue metformin and glipizide - Glucose (CBG) - gabapentin (NEURONTIN) 300 MG capsule; Take 1 capsule (300 mg total) by mouth 2 (two) times daily.  Dispense: 60 capsule; Refill: 3 - Ambulatory referral to Podiatry  2. Low testosterone Would love to refer him to urology however he has no medical coverage-advised to apply for the: This, and again even though he states he had been denied in the past Will increase dose of testosterone in the meantime - testosterone cypionate (DEPO-TESTOSTERONE) 200 MG/ML injection; Inject 2 mLs (400 mg total) into the muscle every 28 (twenty-eight) days.  Dispense: 10 mL; Refill: 2  3. Erectile dysfunction, unspecified erectile dysfunction  type Secondary to testosterone deficiency  4. Other diabetic neurological complication associated with type 2 diabetes mellitus ([facility]) Increased dose of gabapentin from 100 twice daily to 300 mg twice daily   Meds ordered this encounter  Medications  . gabapentin (NEURONTIN) 300 MG capsule    Sig: Take 1 capsule (300 mg total) by mouth 2 (two) times daily.    Dispense:  60 capsule    Refill:  3    Discontinue previous dose  . testosterone cypionate (DEPO-TESTOSTERONE) 200 MG/ML injection    Sig: Inject 2 mLs (400 mg total) into the muscle every 28 (twenty-eight) days.    Dispense:  10 mL    Refill:  2    Follow-up: Return in about 6 weeks (around 11/27/2015) for follow up of testosterone deficiency.   Arnoldo Morale MD

## 2015-10-16 NOTE — Progress Notes (Signed)
viagra not working neither are the testosterone injection (3)

## 2015-10-23 ENCOUNTER — Ambulatory Visit: Payer: Self-pay

## 2015-11-12 ENCOUNTER — Ambulatory Visit: Payer: Self-pay | Attending: Family Medicine

## 2015-11-12 DIAGNOSIS — E291 Testicular hypofunction: Secondary | ICD-10-CM

## 2015-11-12 DIAGNOSIS — R7989 Other specified abnormal findings of blood chemistry: Secondary | ICD-10-CM

## 2015-11-12 DIAGNOSIS — N529 Male erectile dysfunction, unspecified: Secondary | ICD-10-CM

## 2015-11-12 MED ORDER — TESTOSTERONE CYPIONATE 100 MG/ML IM SOLN
200.0000 mg | INTRAMUSCULAR | Status: DC
  Administered 2015-11-12 – 2015-12-11 (×2): 200 mg via INTRAMUSCULAR

## 2015-11-12 NOTE — Progress Notes (Signed)
Patient here today for testosterone 200 mg/ ml injection. Injection given in buttocks- right upper quadrant.  Patient tolerated well. Patient due back in 28 days.

## 2015-12-02 MED FILL — glipiZIDE ER 10 MG TB24: 10 | 30 days supply | Qty: 30 | Fill #2

## 2015-12-02 MED FILL — metFORMIN HCL 1000 MG TABS: 1000 | 30 days supply | Qty: 60 | Fill #2

## 2015-12-02 MED FILL — GABAPENTIN 100 MG CAPSULE: 100 | 30 days supply | Qty: 60 | Fill #2

## 2015-12-11 ENCOUNTER — Ambulatory Visit: Payer: Self-pay | Attending: Internal Medicine

## 2015-12-11 ENCOUNTER — Encounter (INDEPENDENT_AMBULATORY_CARE_PROVIDER_SITE_OTHER): Payer: Self-pay

## 2015-12-11 DIAGNOSIS — E349 Endocrine disorder, unspecified: Secondary | ICD-10-CM

## 2015-12-11 DIAGNOSIS — E291 Testicular hypofunction: Secondary | ICD-10-CM | POA: Insufficient documentation

## 2015-12-11 DIAGNOSIS — R7989 Other specified abnormal findings of blood chemistry: Secondary | ICD-10-CM

## 2015-12-11 DIAGNOSIS — N529 Male erectile dysfunction, unspecified: Secondary | ICD-10-CM | POA: Insufficient documentation

## 2015-12-11 NOTE — Progress Notes (Signed)
Patient here for testosterone injection. Patient here today for testosterone 200 mg/ ml injection. Injection given in buttocks- left upper quadrant.  Patient tolerated well. Patient due back in 28 days. Reminder card provided. Pollyann KennedyKim Becton, RN, BSN

## 2015-12-11 NOTE — Patient Instructions (Signed)
Patient due back in 28 days.  Reminder card provided  Jonathan KennedyKim Becton, RN, BSN

## 2015-12-21 ENCOUNTER — Ambulatory Visit: Payer: Self-pay | Attending: Internal Medicine

## 2016-01-13 ENCOUNTER — Ambulatory Visit: Payer: Self-pay

## 2016-04-12 ENCOUNTER — Ambulatory Visit: Payer: Self-pay | Attending: Family Medicine | Admitting: Family Medicine

## 2016-04-12 ENCOUNTER — Encounter: Payer: Self-pay | Admitting: Family Medicine

## 2016-04-12 VITALS — BP 130/80 | HR 84 | Temp 98.4°F | Ht >= 80 in | Wt 251.8 lb

## 2016-04-12 DIAGNOSIS — Z9119 Patient's noncompliance with other medical treatment and regimen: Secondary | ICD-10-CM

## 2016-04-12 DIAGNOSIS — E1165 Type 2 diabetes mellitus with hyperglycemia: Secondary | ICD-10-CM | POA: Insufficient documentation

## 2016-04-12 DIAGNOSIS — R5383 Other fatigue: Secondary | ICD-10-CM | POA: Insufficient documentation

## 2016-04-12 DIAGNOSIS — R7989 Other specified abnormal findings of blood chemistry: Secondary | ICD-10-CM

## 2016-04-12 DIAGNOSIS — N529 Male erectile dysfunction, unspecified: Secondary | ICD-10-CM | POA: Insufficient documentation

## 2016-04-12 DIAGNOSIS — IMO0002 Reserved for concepts with insufficient information to code with codable children: Secondary | ICD-10-CM

## 2016-04-12 DIAGNOSIS — Z7984 Long term (current) use of oral hypoglycemic drugs: Secondary | ICD-10-CM | POA: Insufficient documentation

## 2016-04-12 DIAGNOSIS — Z0001 Encounter for general adult medical examination with abnormal findings: Secondary | ICD-10-CM | POA: Insufficient documentation

## 2016-04-12 DIAGNOSIS — E1140 Type 2 diabetes mellitus with diabetic neuropathy, unspecified: Secondary | ICD-10-CM | POA: Insufficient documentation

## 2016-04-12 DIAGNOSIS — E349 Endocrine disorder, unspecified: Secondary | ICD-10-CM

## 2016-04-12 DIAGNOSIS — Z91199 Patient's noncompliance with other medical treatment and regimen due to unspecified reason: Secondary | ICD-10-CM

## 2016-04-12 LAB — POCT GLYCOSYLATED HEMOGLOBIN (HGB A1C): Hemoglobin A1C: 14.9

## 2016-04-12 LAB — COMPLETE METABOLIC PANEL WITH GFR
ALT: 17 U/L (ref 9–46)
AST: 15 U/L (ref 10–40)
Albumin: 4.3 g/dL (ref 3.6–5.1)
Alkaline Phosphatase: 82 U/L (ref 40–115)
BUN: 9 mg/dL (ref 7–25)
CO2: 25 mmol/L (ref 20–31)
Calcium: 9.3 mg/dL (ref 8.6–10.3)
Chloride: 105 mmol/L (ref 98–110)
Creat: 0.95 mg/dL (ref 0.60–1.35)
GFR, Est African American: >89 mL/min (ref >=60)
GFR, Est Non African American: >89 mL/min (ref >=60)
Glucose, Bld: 338 mg/dL — ABNORMAL HIGH (ref 65–99)
Potassium: 3.7 mmol/L (ref 3.5–5.3)
Sodium: 140 mmol/L (ref 135–146)
Total Bilirubin: 0.4 mg/dL (ref 0.2–1.2)
Total Protein: 7.1 g/dL (ref 6.1–8.1)

## 2016-04-12 LAB — GLUCOSE, POCT (MANUAL RESULT ENTRY)
POC GLUCOSE: 434 mg/dL — AB (ref 70–99)
POC Glucose: 434 mg/dl — AB (ref 70–99)

## 2016-04-12 MED ORDER — GLIPIZIDE ER 10 MG PO TB24: 10.0000 mg | ORAL_TABLET | Freq: Every day | ORAL | 3 refills | Status: DC

## 2016-04-12 MED ORDER — ATORVASTATIN CALCIUM 40 MG PO TABS: 40.0000 mg | ORAL_TABLET | Freq: Every day | ORAL | 3 refills | Status: DC

## 2016-04-12 MED ORDER — INSULIN GLARGINE 100 UNIT/ML SOLOSTAR PEN: 10.0000 [IU] | PEN_INJECTOR | Freq: Every day | SUBCUTANEOUS | 3 refills | Status: DC

## 2016-04-12 MED ORDER — INSULIN ASPART 100 UNIT/ML ~~LOC~~ SOLN
20.0000 [IU] | Freq: Once | SUBCUTANEOUS | Status: AC
  Administered 2016-04-12: 20 [IU] via SUBCUTANEOUS

## 2016-04-12 MED ORDER — METFORMIN HCL 1000 MG PO TABS: 1000.0000 mg | ORAL_TABLET | Freq: Two times a day (BID) | ORAL | 3 refills | Status: DC

## 2016-04-12 MED ORDER — GLUCOSE BLOOD VI STRP: ORAL_STRIP | 12 refills | Status: DC

## 2016-04-12 MED ORDER — TRUEPLUS LANCETS 28G MISC: 1.0000 | Freq: Three times a day (TID) | 12 refills | Status: DC

## 2016-04-12 MED ORDER — GABAPENTIN 300 MG PO CAPS: 300.0000 mg | ORAL_CAPSULE | Freq: Two times a day (BID) | ORAL | 3 refills | Status: DC

## 2016-04-12 MED ORDER — TRUE METRIX METER DEVI: 1.0000 | Freq: Three times a day (TID) | 0 refills | Status: DC

## 2016-04-12 MED FILL — ATORVASTATIN 40 MG TABLET: 40 | 30 days supply | Qty: 30 | Fill #0

## 2016-04-12 MED FILL — TRUE METRIX BLOOD GLUCOSE M: W/DEVICE | 1 days supply | Qty: 1 | Fill #0

## 2016-04-12 MED FILL — glipiZIDE ER 10 MG TB24: 10 | 30 days supply | Qty: 30 | Fill #0

## 2016-04-12 MED FILL — TRUE METRIX TEST STRIP: 30 days supply | Qty: 100 | Fill #0

## 2016-04-12 MED FILL — TRUEplus LANCETS 28G MISC: 30 days supply | Qty: 100 | Fill #0

## 2016-04-12 MED FILL — GABAPENTIN 300 MG CAPSULE: 300 | 30 days supply | Qty: 60 | Fill #0

## 2016-04-12 MED FILL — metFORMIN HCL 1000 MG TABS: 1000 | 30 days supply | Qty: 60 | Fill #0

## 2016-04-12 NOTE — Progress Notes (Signed)
Subjective:  Patient ID: Jonathan [name], male    DOB: September 02, 1980  Age: 36 y.o. MRN: [id number]  CC: Diabetes   HPI Jonathan [name] is a 36 year old male with a history of type 2 diabetes mellitus (A1c 14.9), diabetic neuropathy, erectile dysfunction and testosterone deficiency who comes into the clinic for a follow-up visit.  Non compliant with his metformin and glipizide and sometimes forgets to take it or does not get a refill on time. He has also been noncompliant with checking his blood sugars. Denies numbness in extremities or visual symptoms.  Complains of erectile dysfunction and now he is unable to ejaculate. Has tried Viagra in the past with no improvement and also received testosterone replacement on three occasion all to no avail. And has been to refer him to urology however he is yet to be appropriate for the [facility] discount.  Past Medical History:  Diagnosis Date  . Diabetes mellitus     History reviewed. No pertinent surgical history.  No Known Allergies   Outpatient Medications Prior to Visit  Medication Sig Dispense Refill  . ibuprofen (ADVIL,MOTRIN) 800 MG tablet Take 1 tablet (800 mg total) by mouth 3 (three) times daily. 21 tablet 0  . gabapentin (NEURONTIN) 300 MG capsule Take 1 capsule (300 mg total) by mouth 2 (two) times daily. 60 capsule 3  . glipiZIDE (GLUCOTROL XL) 10 MG 24 hr tablet Take 1 tablet (10 mg total) by mouth daily with breakfast. 30 tablet 3  . metFORMIN (GLUCOPHAGE) 1000 MG tablet Take 1 tablet (1,000 mg total) by mouth 2 (two) times daily. 60 tablet 3  . glucose blood test strip Use as instructed (Patient not taking: Reported on 04/12/2016) 100 each 12  . glucose monitoring kit (FREESTYLE) monitoring kit 1 each by Does not apply route as needed. (Patient not taking: Reported on 04/12/2016) 1 each 0  . Lancets (FREESTYLE) lancets Use as instructed (Patient not taking: Reported on 04/12/2016) 100 each 12  . sildenafil (VIAGRA) 25 MG tablet  Take 1 tablet (25 mg total) by mouth daily as needed for erectile dysfunction. (Patient not taking: Reported on 07/02/2014) 30 tablet 3  . testosterone cypionate (DEPO-TESTOSTERONE) 200 MG/ML injection Inject 2 mLs (400 mg total) into the muscle every 28 (twenty-eight) days. (Patient not taking: Reported on 04/12/2016) 10 mL 2  . cyclobenzaprine (FLEXERIL) 10 MG tablet Take 1 tablet (10 mg total) by mouth every 12 (twelve) hours as needed for muscle spasms. (Patient not taking: Reported on 10/16/2015) 60 tablet 1   Facility-Administered Medications Prior to Visit  Medication Dose Route Frequency Provider Last Rate Last Dose  . testosterone cypionate (DEPOTESTOTERONE CYPIONATE) injection 200 mg  200 mg Intramuscular Q28 days Arnoldo Morale, MD   200 mg at 12/11/15 1046    ROS Review of Systems  Constitutional: Negative for activity change and appetite change.  HENT: Negative for sinus pressure and sore throat.   Respiratory: Negative for chest tightness, shortness of breath and wheezing.   Cardiovascular: Negative for chest pain and palpitations.  Gastrointestinal: Negative for abdominal distention, abdominal pain and constipation.  Genitourinary: Negative.   Musculoskeletal: Negative.   Psychiatric/Behavioral: Negative for behavioral problems and dysphoric mood.    Objective:  BP 130/80 (BP Location: Right Arm, Patient Position: Sitting, Cuff Size: Small)   Pulse 84   Temp 98.4 F (36.9 C) (Oral)   Ht 6' 8" (2.032 m)   Wt 251 lb 12.8 oz (114.2 kg)   SpO2 100%   BMI 27.66 kg/m  BP/Weight 04/12/2016 5/53/7482 7/0/7867  Systolic BP 544 920 100  Diastolic BP 80 75 77  Wt. (Lbs) 251.8 258.8 248.6  BMI 27.66 28.43 27.99      Physical Exam  Constitutional: He is oriented to person, place, and time. He appears well-developed and well-nourished.  Cardiovascular: Normal rate, normal heart sounds and intact distal pulses.   No murmur heard. Pulmonary/Chest: Effort normal and breath sounds  normal. He has no wheezes. He has no rales. He exhibits no tenderness.  Abdominal: Soft. Bowel sounds are normal. He exhibits no distension and no mass. There is no tenderness.  Musculoskeletal: Normal range of motion.  Neurological: He is alert and oriented to person, place, and time.     Lab Results  Component Value Date   HGBA1C 14.9 04/12/2016    Assessment & Plan:   1. DM type 2, uncontrolled, with neuropathy ([place]) Uncontrolled with A1c of 14.9 Novolog 20 units administered due to CBG of 434 Stressed importance of compliance; implications of noncompliance discussed Will commenced Lantus-; he will see the clinical pharmacist for education and administration - Glucose (CBG) - HgB A1c - insulin aspart (novoLOG) injection 20 Units; Inject 0.2 mLs (20 Units total) into the skin once. - metFORMIN (GLUCOPHAGE) 1000 MG tablet; Take 1 tablet (1,000 mg total) by mouth 2 (two) times daily.  Dispense: 60 tablet; Refill: 3 - glipiZIDE (GLUCOTROL XL) 10 MG 24 hr tablet; Take 1 tablet (10 mg total) by mouth daily with breakfast.  Dispense: 30 tablet; Refill: 3 - gabapentin (NEURONTIN) 300 MG capsule; Take 1 capsule (300 mg total) by mouth 2 (two) times daily.  Dispense: 60 capsule; Refill: 3 - atorvastatin (LIPITOR) 40 MG tablet; Take 1 tablet (40 mg total) by mouth daily.  Dispense: 90 tablet; Refill: 3 - Insulin Glargine (LANTUS SOLOSTAR) 100 UNIT/ML Solostar Pen; Inject 10 Units into the skin daily at 10 pm.  Dispense: 5 pen; Refill: 3 - COMPLETE METABOLIC PANEL WITH GFR - glucose blood (TRUE METRIX BLOOD GLUCOSE TEST) test strip; Used 3 times before meals  Dispense: 100 each; Refill: 12 - Blood Glucose Monitoring Suppl (TRUE METRIX METER) DEVI; 1 each by Does not apply route 3 (three) times daily before meals.  Dispense: 1 Device; Refill: 0 - TRUEPLUS LANCETS 28G MISC; 1 each by Does not apply route 3 (three) times daily before meals.  Dispense: 100 each; Refill: 12  2. Low  testosterone Could explain erectile dysfunction and fatigue - testosterone surprisingly low for his age Will check for improvement as he has received replacement of three occassions Not willing to continue with testosterone replacement Urology referral pending approval of CAFA - Testosterone Total,Free,Bio, Males   Meds ordered this encounter  Medications  . insulin aspart (novoLOG) injection 20 Units  . metFORMIN (GLUCOPHAGE) 1000 MG tablet    Sig: Take 1 tablet (1,000 mg total) by mouth 2 (two) times daily.    Dispense:  60 tablet    Refill:  3  . glipiZIDE (GLUCOTROL XL) 10 MG 24 hr tablet    Sig: Take 1 tablet (10 mg total) by mouth daily with breakfast.    Dispense:  30 tablet    Refill:  3  . gabapentin (NEURONTIN) 300 MG capsule    Sig: Take 1 capsule (300 mg total) by mouth 2 (two) times daily.    Dispense:  60 capsule    Refill:  3    Discontinue previous dose  . atorvastatin (LIPITOR) 40 MG tablet    Sig: Take  1 tablet (40 mg total) by mouth daily.    Dispense:  90 tablet    Refill:  3  . Insulin Glargine (LANTUS SOLOSTAR) 100 UNIT/ML Solostar Pen    Sig: Inject 10 Units into the skin daily at 10 pm.    Dispense:  5 pen    Refill:  3  . glucose blood (TRUE METRIX BLOOD GLUCOSE TEST) test strip    Sig: Used 3 times before meals    Dispense:  100 each    Refill:  12  . Blood Glucose Monitoring Suppl (TRUE METRIX METER) DEVI    Sig: 1 each by Does not apply route 3 (three) times daily before meals.    Dispense:  1 Device    Refill:  0  . TRUEPLUS LANCETS 28G MISC    Sig: 1 each by Does not apply route 3 (three) times daily before meals.    Dispense:  100 each    Refill:  12    Follow-up: Return in about 3 weeks (around 05/03/2016) for DM follow up with PCP.   Arnoldo Morale MD

## 2016-04-13 LAB — TESTOSTERONE TOTAL,FREE,BIO, MALES
ALBUMIN: 4.3 g/dL (ref 3.6–5.1)
Sex Hormone Binding: 5 nmol/L — ABNORMAL LOW (ref 10–50)
Testosterone: 159 ng/dL — ABNORMAL LOW (ref 250–827)

## 2016-04-13 MED FILL — !LANTUS SOLOSTAR 100UNITS/M: 100 | 30 days supply | Qty: 3 | Fill #0

## 2016-04-21 ENCOUNTER — Telehealth: Payer: Self-pay | Admitting: *Deleted

## 2016-04-21 NOTE — Telephone Encounter (Signed)
-----   Message from Jaclyn ShaggyEnobong Amao, MD sent at 04/14/2016  1:10 PM EST ----- Testosterone level is still low but has improved slightly compared to his last set of labs. I would recommend continuing testosterone supplementation until he is able to see urology.

## 2016-04-21 NOTE — Telephone Encounter (Signed)
Patient verified DOB Patient is aware of testosterone level being low but slightly improved from previous results. Patient is advised to continue with the injections until he is seen by the urologist. Patient was transferred to the front to scehdule a financial appointment. Patient expressed his understanding and had no questions prior to being transferred.

## 2016-05-03 ENCOUNTER — Ambulatory Visit: Payer: Self-pay | Admitting: Family Medicine

## 2016-07-05 ENCOUNTER — Ambulatory Visit: Payer: Self-pay | Admitting: Family Medicine

## 2016-08-25 MED FILL — GABAPENTIN 300 MG CAPSULE: 300 | 30 days supply | Qty: 60 | Fill #1

## 2016-08-25 MED FILL — ?ATORVASTATIN 40MG TABLET: 40 | 30 days supply | Qty: 30 | Fill #1

## 2016-08-25 MED FILL — ?METFORMIN HCL 1,000 MG TAB: 1000 | 30 days supply | Qty: 60 | Fill #1

## 2016-08-25 MED FILL — glipiZIDE XL 10 MG TB24: 10 | 30 days supply | Qty: 30 | Fill #1

## 2016-10-21 MED FILL — GABAPENTIN 300 MG CAPSULE: 300 | 30 days supply | Qty: 60 | Fill #2

## 2016-10-21 MED FILL — ?ATORVASTATIN 40MG TABLET: 40 | 30 days supply | Qty: 30 | Fill #2

## 2016-10-21 MED FILL — ?METFORMIN HCL 1,000 MG TAB: 1000 | 30 days supply | Qty: 60 | Fill #2

## 2016-10-21 MED FILL — glipiZIDE ER 10 MG TB24: 10 | 30 days supply | Qty: 30 | Fill #2

## 2016-11-23 ENCOUNTER — Ambulatory Visit (HOSPITAL_COMMUNITY)
Admission: EM | Admit: 2016-11-23 | Discharge: 2016-11-23 | Disposition: A | Discharge status: Home / Self Care | Payer: Self-pay | Attending: Family Medicine | Admitting: Family Medicine

## 2016-11-23 ENCOUNTER — Encounter (HOSPITAL_COMMUNITY): Payer: Self-pay | Admitting: Emergency Medicine

## 2016-11-23 DIAGNOSIS — E11628 Type 2 diabetes mellitus with other skin complications: Secondary | ICD-10-CM

## 2016-11-23 DIAGNOSIS — S91102A Unspecified open wound of left great toe without damage to nail, initial encounter: Secondary | ICD-10-CM

## 2016-11-23 DIAGNOSIS — R234 Changes in skin texture: Secondary | ICD-10-CM

## 2016-11-23 DIAGNOSIS — Z794 Long term (current) use of insulin: Secondary | ICD-10-CM

## 2016-11-23 MED ORDER — CEPHALEXIN 500 MG PO CAPS: 500.0000 mg | ORAL_CAPSULE | Freq: Four times a day (QID) | ORAL | 0 refills | Status: DC

## 2016-11-23 NOTE — Discharge Instructions (Signed)
You will likely need some sort of minor surgery to the toe as well as continuous wound care. Take the antibiotic as directed. Call the [facility] as listed on this page today for an appointment. Do not delay. If you do this could be serious and he could lose your toe.

## 2016-11-23 NOTE — ED Triage Notes (Signed)
Patient has a left great toe that is black on the bottom.  Top of toe, nail bed looks normal.  Patient remembers being at a pool one month ago and stepping on something in the pool.  One week ago noticed dark color to toe.  Denies putting anything on toe.  Patient says pain started last week.    Patient does have pictures of toe when initially injured.

## 2016-11-23 NOTE — ED Provider Notes (Signed)
[facility]    CSN: [id number] Arrival date & time: 11/23/16  1057     History   Chief Complaint Chief Complaint  Patient presents with  . Toe Injury    HPI Jonathan [name] is a 36 y.o. male.   36 year old male with history of uncontrolled type 2 diabetes mellitus states that he was in a pull about a month ago and stepped on something sharp causing a penetrating wound to the plantar aspect of the left great toe. There was an initial episode of wound formation and drainage that the patient believed to be an infectious. Approximately 2 weeks ago the drainage stopped and has since formed a large area of dry eschar over the flexor surface of the great toe. Denies local pain with the exception of heavy pressure over the wound such as with weightbearing. Denies systemic symptoms.      Past Medical History:  Diagnosis Date  . Diabetes mellitus     Patient Active Problem List   Diagnosis Date Noted  . Diabetic neuropathy ([place]) 10/16/2015  . Erectile dysfunction 08/05/2015  . Low testosterone 09/12/2014  . DM type 2, uncontrolled, with neuropathy ([place]) 06/12/2014  . Diabetes mellitus, type II ([place]) 03/23/2012  . Noncompliance 03/23/2012  . Gastroenteritis and colitis, viral 03/23/2012    History reviewed. No pertinent surgical history.     Home Medications    Prior to Admission medications   Medication Sig Start Date End Date Taking? Authorizing Provider  atorvastatin (LIPITOR) 40 MG tablet Take 1 tablet (40 mg total) by mouth daily. 04/12/16   Arnoldo Morale, MD  Blood Glucose Monitoring Suppl (TRUE METRIX METER) DEVI 1 each by Does not apply route 3 (three) times daily before meals. 04/12/16   Arnoldo Morale, MD  cephALEXin (KEFLEX) 500 MG capsule Take 1 capsule (500 mg total) by mouth 4 (four) times daily. 11/23/16   Janne Napoleon, NP  gabapentin (NEURONTIN) 300 MG capsule Take 1 capsule (300 mg total) by mouth 2 (two) times daily. 04/12/16   Arnoldo Morale, MD    glipiZIDE (GLUCOTROL XL) 10 MG 24 hr tablet Take 1 tablet (10 mg total) by mouth daily with breakfast. 04/12/16   Arnoldo Morale, MD  glucose blood (TRUE METRIX BLOOD GLUCOSE TEST) test strip Used 3 times before meals 04/12/16   Arnoldo Morale, MD  glucose blood test strip Use as instructed Patient not taking: Reported on 04/12/2016 06/12/14   Lance Bosch, NP  glucose monitoring kit (FREESTYLE) monitoring kit 1 each by Does not apply route as needed. Patient not taking: Reported on 04/12/2016 06/12/14   Lance Bosch, NP  ibuprofen (ADVIL,MOTRIN) 800 MG tablet Take 1 tablet (800 mg total) by mouth 3 (three) times daily. 04/01/15   Domenic Moras, PA-C  Insulin Glargine (LANTUS SOLOSTAR) 100 UNIT/ML Solostar Pen Inject 10 Units into the skin daily at 10 pm. 04/12/16   Arnoldo Morale, MD  Lancets (FREESTYLE) lancets Use as instructed Patient not taking: Reported on 04/12/2016 06/12/14   Lance Bosch, NP  metFORMIN (GLUCOPHAGE) 1000 MG tablet Take 1 tablet (1,000 mg total) by mouth 2 (two) times daily. 04/12/16   Arnoldo Morale, MD  TRUEPLUS LANCETS 28G MISC 1 each by Does not apply route 3 (three) times daily before meals. 04/12/16   Arnoldo Morale, MD    Family History Family History  Problem Relation Age of Onset  . Family history unknown: Yes    Social History Social History  Substance Use Topics  . Smoking  status: Never Smoker  . Smokeless tobacco: Never Used  . Alcohol use 1.2 oz/week    1 Glasses of wine, 1 Cans of beer per week     Comment: occassional     Allergies   Patient has no known allergies.   Review of Systems Review of Systems  Constitutional: Negative.   HENT: Negative.   Skin: Positive for color change and wound.  Neurological: Negative.   All other systems reviewed and are negative.    Physical Exam Triage Vital Signs ED Triage Vitals  Enc Vitals Group     BP 11/23/16 1222 122/86     Pulse Rate 11/23/16 1222 93     Resp 11/23/16 1222 20     Temp 11/23/16  1222 98.2 F (36.8 C)     Temp Source 11/23/16 1222 Oral     SpO2 11/23/16 1222 100 %     Weight --      Height --      Head Circumference --      Peak Flow --      Pain Score 11/23/16 1220 7     Pain Loc --      Pain Edu? --      Excl. in [place]? --    No data found.   Updated Vital Signs BP 122/86 (BP Location: Left Arm)   Pulse 93   Temp 98.2 F (36.8 C) (Oral)   Resp 20   SpO2 100%   Visual Acuity Right Eye Distance:   Left Eye Distance:   Bilateral Distance:    Right Eye Near:   Left Eye Near:    Bilateral Near:     Physical Exam  Constitutional: He is oriented to person, place, and time. He appears well-developed and well-nourished. No distress.  Neck: Neck supple.  Cardiovascular: Normal rate.   Pulmonary/Chest: Effort normal.  Neurological: He is alert and oriented to person, place, and time.  Skin: Skin is warm and dry.  The involved foot is warm with 2+ pedal pulse. The dorsal aspect of the toe is pain without apparent nail damage. There is a large thick crusty dry eschar that has formed over the plantar aspect of the great toe. Currently no drainage. No pain. No erythema. Able to wiggle his toe.  Nursing note and vitals reviewed.    UC Treatments / Results  Labs (all labs ordered are listed, but only abnormal results are displayed) Labs Reviewed - No data to display  EKG  EKG Interpretation None         Radiology No results found.  Procedures Procedures (including critical care time)  Medications Ordered in UC Medications - No data to display   Initial Impression / Assessment and Plan / UC Course  I have reviewed the triage vital signs and the nursing notes.  Pertinent labs & imaging results that were available during my care of the patient were reviewed by me and considered in my medical decision making (see chart for details).    You will likely need some sort of minor surgery to the toe as well as continuous wound care. Take the  antibiotic as directed. [facility] today for an appointment. Do not delay. If you do this could be serious and he could lose your toe.    Final Clinical Impressions(s) / UC Diagnoses   Final diagnoses:  Eschar of toe  Type 2 diabetes mellitus with other skin complication, with long-term current  use of insulin (HCC)    New Prescriptions New Prescriptions   CEPHALEXIN (KEFLEX) 500 MG CAPSULE    Take 1 capsule (500 mg total) by mouth 4 (four) times daily.     Controlled Substance Prescriptions [place] Controlled Substance Registry consulted? Not Applicable   Janne Napoleon, NP 11/23/16 1321

## 2016-12-01 MED FILL — ?METFORMIN HCL 1,000 MG TAB: 1000 | 30 days supply | Qty: 60 | Fill #3

## 2016-12-01 MED FILL — glipiZIDE XL 10 MG TB24: 10 | 30 days supply | Qty: 30 | Fill #3

## 2016-12-01 MED FILL — ?ATORVASTATIN 40MG TABLET: 40 | 30 days supply | Qty: 30 | Fill #3

## 2016-12-01 MED FILL — GABAPENTIN 300 MG CAPSULE: 300 | 30 days supply | Qty: 60 | Fill #3

## 2016-12-05 ENCOUNTER — Encounter (HOSPITAL_COMMUNITY): Payer: Self-pay | Admitting: Emergency Medicine

## 2016-12-05 ENCOUNTER — Observation Stay (HOSPITAL_COMMUNITY)
Admission: EM | Admit: 2016-12-05 | Discharge: 2016-12-06 | Disposition: A | Discharge status: Home / Self Care | Payer: Self-pay | Attending: Internal Medicine | Admitting: Internal Medicine

## 2016-12-05 ENCOUNTER — Encounter: Payer: Self-pay | Admitting: Podiatry

## 2016-12-05 ENCOUNTER — Emergency Department (HOSPITAL_COMMUNITY): Payer: Self-pay

## 2016-12-05 DIAGNOSIS — N529 Male erectile dysfunction, unspecified: Secondary | ICD-10-CM | POA: Insufficient documentation

## 2016-12-05 DIAGNOSIS — L97529 Non-pressure chronic ulcer of other part of left foot with unspecified severity: Secondary | ICD-10-CM | POA: Insufficient documentation

## 2016-12-05 DIAGNOSIS — Z833 Family history of diabetes mellitus: Secondary | ICD-10-CM | POA: Insufficient documentation

## 2016-12-05 DIAGNOSIS — R739 Hyperglycemia, unspecified: Secondary | ICD-10-CM | POA: Diagnosis present

## 2016-12-05 DIAGNOSIS — L97528 Non-pressure chronic ulcer of other part of left foot with other specified severity: Secondary | ICD-10-CM

## 2016-12-05 DIAGNOSIS — E118 Type 2 diabetes mellitus with unspecified complications: Secondary | ICD-10-CM | POA: Diagnosis present

## 2016-12-05 DIAGNOSIS — E1165 Type 2 diabetes mellitus with hyperglycemia: Secondary | ICD-10-CM | POA: Insufficient documentation

## 2016-12-05 DIAGNOSIS — Z9119 Patient's noncompliance with other medical treatment and regimen: Secondary | ICD-10-CM | POA: Insufficient documentation

## 2016-12-05 DIAGNOSIS — S99922A Unspecified injury of left foot, initial encounter: Secondary | ICD-10-CM

## 2016-12-05 DIAGNOSIS — Z794 Long term (current) use of insulin: Secondary | ICD-10-CM | POA: Insufficient documentation

## 2016-12-05 DIAGNOSIS — E1140 Type 2 diabetes mellitus with diabetic neuropathy, unspecified: Secondary | ICD-10-CM | POA: Insufficient documentation

## 2016-12-05 DIAGNOSIS — Z8249 Family history of ischemic heart disease and other diseases of the circulatory system: Secondary | ICD-10-CM | POA: Insufficient documentation

## 2016-12-05 DIAGNOSIS — Z7984 Long term (current) use of oral hypoglycemic drugs: Secondary | ICD-10-CM | POA: Insufficient documentation

## 2016-12-05 DIAGNOSIS — IMO0002 Reserved for concepts with insufficient information to code with codable children: Secondary | ICD-10-CM | POA: Diagnosis present

## 2016-12-05 DIAGNOSIS — R7989 Other specified abnormal findings of blood chemistry: Secondary | ICD-10-CM

## 2016-12-05 DIAGNOSIS — Z79899 Other long term (current) drug therapy: Secondary | ICD-10-CM | POA: Insufficient documentation

## 2016-12-05 DIAGNOSIS — E11621 Type 2 diabetes mellitus with foot ulcer: Principal | ICD-10-CM | POA: Diagnosis present

## 2016-12-05 LAB — CBC WITH DIFFERENTIAL/PLATELET
BASOS ABS: 0 10*3/uL (ref 0.0–0.1)
BASOS PCT: 0 %
EOS ABS: 0 10*3/uL (ref 0.0–0.7)
Eosinophils Relative: 1 %
HCT: 50.9 % (ref 39.0–52.0)
HEMOGLOBIN: 16.6 g/dL (ref 13.0–17.0)
LYMPHS ABS: 1.9 10*3/uL (ref 0.7–4.0)
Lymphocytes Relative: 42 %
MCH: 25.5 pg — ABNORMAL LOW (ref 26.0–34.0)
MCHC: 32.6 g/dL (ref 30.0–36.0)
MCV: 78.2 fL (ref 78.0–100.0)
Monocytes Absolute: 0.3 10*3/uL (ref 0.1–1.0)
Monocytes Relative: 6 %
NEUTROS PCT: 52 %
Neutro Abs: 2.3 10*3/uL (ref 1.7–7.7)
Platelets: 157 10*3/uL (ref 150–400)
RBC: 6.51 MIL/uL — AB (ref 4.22–5.81)
RDW: 13 % (ref 11.5–15.5)
WBC: 4.5 10*3/uL (ref 4.0–10.5)

## 2016-12-05 LAB — I-STAT CG4 LACTIC ACID, ED
LACTIC ACID, VENOUS: 1.75 mmol/L (ref 0.5–1.9)
LACTIC ACID, VENOUS: 1.58 mmol/L (ref 0.5–1.9)

## 2016-12-05 LAB — URINALYSIS, ROUTINE W REFLEX MICROSCOPIC
BILIRUBIN URINE: NEGATIVE
Bacteria, UA: NONE SEEN
Glucose, UA: >=500 mg/dL — AB
HGB URINE DIPSTICK: NEGATIVE
Ketones, ur: 5 mg/dL — AB
LEUKOCYTES UA: NEGATIVE
NITRITE: NEGATIVE
PH: 5 (ref 5.0–8.0)
Protein, ur: NEGATIVE mg/dL
SPECIFIC GRAVITY, URINE: 1.028 (ref 1.005–1.030)
Squamous Epithelial / LPF: NONE SEEN

## 2016-12-05 LAB — COMPREHENSIVE METABOLIC PANEL
ALT: 31 U/L (ref 17–63)
AST: 24 U/L (ref 15–41)
Albumin: 4.3 g/dL (ref 3.5–5.0)
Alkaline Phosphatase: 89 U/L (ref 38–126)
Anion gap: 9 (ref 5–15)
BUN: 5 mg/dL — ABNORMAL LOW (ref 6–20)
CALCIUM: 9.8 mg/dL (ref 8.9–10.3)
CHLORIDE: 101 mmol/L (ref 101–111)
CO2: 27 mmol/L (ref 22–32)
CREATININE: 0.84 mg/dL (ref 0.61–1.24)
Glucose, Bld: 310 mg/dL — ABNORMAL HIGH (ref 65–99)
Potassium: 5.1 mmol/L (ref 3.5–5.1)
Sodium: 137 mmol/L (ref 135–145)
Total Bilirubin: 0.6 mg/dL (ref 0.3–1.2)
Total Protein: 8.6 g/dL — ABNORMAL HIGH (ref 6.5–8.1)

## 2016-12-05 LAB — GLUCOSE, CAPILLARY: Glucose-Capillary: 285 mg/dL — ABNORMAL HIGH (ref 65–99)

## 2016-12-05 LAB — SEDIMENTATION RATE: Sed Rate: 1 mm/hr (ref 0–16)

## 2016-12-05 LAB — C-REACTIVE PROTEIN: CRP: <0.8 mg/dL (ref <1.0)

## 2016-12-05 MED ORDER — INSULIN GLARGINE 100 UNIT/ML ~~LOC~~ SOLN
10.0000 [IU] | Freq: Every day | SUBCUTANEOUS | Status: DC
  Administered 2016-12-05: 10 [IU] via SUBCUTANEOUS
  Filled 2016-12-05 (×2): qty 0.1

## 2016-12-05 MED ORDER — PIPERACILLIN-TAZOBACTAM 3.375 G IVPB 30 MIN
3.3750 g | Freq: Once | INTRAVENOUS | Status: AC
  Administered 2016-12-05: 3.375 g via INTRAVENOUS
  Filled 2016-12-05: qty 50

## 2016-12-05 MED ORDER — ACETAMINOPHEN 325 MG PO TABS
650.0000 mg | ORAL_TABLET | Freq: Four times a day (QID) | ORAL | Status: DC | PRN
  Administered 2016-12-06: 650 mg via ORAL
  Filled 2016-12-05: qty 2

## 2016-12-05 MED ORDER — ENOXAPARIN SODIUM 40 MG/0.4ML ~~LOC~~ SOLN
40.0000 mg | SUBCUTANEOUS | Status: DC
  Administered 2016-12-05: 40 mg via SUBCUTANEOUS
  Filled 2016-12-05: qty 0.4

## 2016-12-05 MED ORDER — INSULIN ASPART 100 UNIT/ML ~~LOC~~ SOLN
0.0000 [IU] | Freq: Three times a day (TID) | SUBCUTANEOUS | Status: DC
  Administered 2016-12-06 (×2): 8 [IU] via SUBCUTANEOUS

## 2016-12-05 MED ORDER — SODIUM CHLORIDE 0.9 % IV BOLUS (SEPSIS)
1000.0000 mL | Freq: Once | INTRAVENOUS | Status: AC
  Administered 2016-12-05: 1000 mL via INTRAVENOUS

## 2016-12-05 NOTE — ED Notes (Signed)
Admitting at bedside 

## 2016-12-05 NOTE — ED Notes (Signed)
Portable xray at bedside.

## 2016-12-05 NOTE — ED Notes (Signed)
Pt brother brought patient food. Pt updated about bed status.

## 2016-12-05 NOTE — H&P (Signed)
Date: 12/05/2016               Patient Name:  Jonathan [name] MRN: [id number]  DOB: [birth year] Age / Sex: 36 y.o., male   PCP: Arnoldo Morale, MD         Medical Service: Internal Medicine Teaching Service         Attending Physician: Dr. Oda Kilts, MD    First Contact: Dr. Thomasene Ripple Pager: [phone]  Second Contact: Dr. Kalman Shan Pager: [phone]       After Hours (After 5p/  First Contact Pager: [phone]  weekends / holidays): Second Contact Pager: [phone]   Chief Complaint: "L toe drainage"  History of Present Illness: Jonathan [name] is a 36 yo with a PMH of insulin dependent B0WU complicated by diabetic neuropathy who presents with a multi-month history of worsening drainage and pain associated with a wound on the plantar aspect of his L great toe. The patient states that around the 4th of July he was swimming in his friend's pool when he stepped on something. He noticed some pain during that time, but nothing significant enough for him to seek care. After the initial event, the patient noticed a small dark spot on the bottom of the great time that has evolved over time into a large callus with central eschar. 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 The patient has not noticed any purulent drainage from the wound, but has noticed that there has been an increase in bloody drainage over the past few weeks. This bloody drainage was concerning to the patient, so he previously sought care at a local Urgent Care. He was given oral antibiotics (Keflex), told to put cotton on the wound, and also told to follow up with [facility]. The patient endorses some swelling of the L foot/leg which occurs mainly after standing on his feet all day. The patient denies fevers, chills, SOB, chest and abdominal pain, and  nausea/vomiting.  The patient was afebrile on admission with vital signs within normal limits. In the ED the patient underwent debridement of L great toe and was given IV zosyn for concern for osteomyelitis. He had a 3-view foot X Ray that showed soft tissue swelling without evidence of underlying osteomyelitis.   Meds:  Current Meds  Medication Sig  . atorvastatin (LIPITOR) 40 MG tablet Take 1 tablet (40 mg total) by mouth daily.  . Blood Glucose Monitoring Suppl (TRUE METRIX METER) DEVI 1 each by Does not apply route 3 (three) times daily before meals.  . cephALEXin (KEFLEX) 500 MG capsule Take 1 capsule (500 mg total) by mouth 4 (four) times daily.  [name] gabapentin (NEURONTIN) 300 MG capsule Take 1 capsule (300 mg total) by mouth 2 (two) times daily.  [name] glipiZIDE (GLUCOTROL XL) 10 MG 24 hr tablet Take 1 tablet (10 mg total) by mouth daily with breakfast.  . glucose blood (TRUE METRIX BLOOD GLUCOSE TEST) test strip Used 3 times before meals  . glucose blood test strip Use as instructed  . glucose monitoring kit (FREESTYLE) monitoring kit 1 each by Does not apply route as needed.  [name] ibuprofen (ADVIL,MOTRIN) 800 MG tablet Take 1 tablet (800 mg total) by mouth 3 (three) times daily.  . Insulin Glargine (LANTUS SOLOSTAR) 100 UNIT/ML Solostar Pen Inject 10  Units into the skin daily at 10 pm.  . Lancets (FREESTYLE) lancets Use as instructed  . metFORMIN (GLUCOPHAGE) 1000 MG tablet Take 1 tablet (1,000 mg total) by mouth 2 (two) times daily.  . TRUEPLUS LANCETS 28G MISC 1 each by Does not apply route 3 (three) times daily before meals.   Allergies: Allergies as of 12/05/2016  . (No Known Allergies)   Past Medical History: Past Medical History:  Diagnosis Date  . 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 Denies tobacco use and illicit drug use. Drinks alcohol socially and  states that he drinks alcohol intermittently.   Review of Systems: A complete ROS was negative except as per HPI.   Physical Exam: Blood pressure 123/85, pulse 91, temperature 98.7 F (37.1 C), temperature source Oral, resp. rate 16, height _0  (2.032 m), weight 257 lb 1 oz (116.6 kg), SpO2 100 %.   Physical Exam  Constitutional: He appears well-developed and well-nourished. No distress.  HENT:  Mouth/Throat: Oropharynx is clear and moist.  Cardiovascular: Normal rate and regular rhythm.  Exam reveals no friction rub.   No murmur heard. 2+ radial pulses bilaterally. 2+ posterior tibial bilaterally. 2+ dorsalis pedis on R, 1+ dorsalis pedis on L.  Pulmonary/Chest: Effort normal. No respiratory distress. He has no wheezes. He has no rales.  Abdominal: Soft. He exhibits no distension and no mass. There is no tenderness. There is no guarding.  Musculoskeletal: He exhibits edema (of L foot compared to R foot). He exhibits no tenderness.  1-2 cm well circumscribed callus with central, dark eschar draining non purulent sanguinous fluid on plantar aspect of L great toe (see picture taken by ED physician s/p debridement in ED).   Lymphadenopathy:    He has no cervical adenopathy.  Skin: Skin is warm and dry. Capillary refill takes less than 2 seconds. No erythema. No pallor.  No overlying erythema on L great toe or LLE.      Assessment & Plan by Problem: Active Problems:   Diabetic ulcer of left foot ([place])  Jonathan [name] is a 36 yo with a PMH of insulin dependent [id number] complicated by diabetic neuropathy who is presenting with a multi-month history of a wound on the plantar aspect of his L great toe. The patient was previously evaluated by Urgent Care physician and given oral antibiotics for concern of cellulitis. He was reevaluated in the ED where a I&D was performed and the patient was given IV antibiotics with concern for underlying osteomyelitis. His foot X ray obtained on admission  showed soft tissue swelling and no concern for underlying osteomyelitis. He was admitted to the internal medicine teaching service for management. The specific problems addressed during his admission are as follows:  L great toe ulcer, concerning for diabetic neuropathic ulcer: The patient's history of diabetic neuropathy along with progressive nature of this wound is concerning for diabetic foot ulcer. His wound does not have purulent discharge, it is non-tender to the touch and it does not have overlying erythema, which are all reassuring physical exam findings suggesting that there is not an underlying infection. The ED was concerned that the patient may have osteomyelitis, which is reasonable as the patient has a history of T2DM. The patient's initial imaging in the ED was negative, however a plain film Xray is not the test of choice to evaluate for  osteomyelitis. Since the patient looks clinically well and shows no signs of infection on physical exam, will obtain ESR/CRP and ABI to further screen for possible underlying osteomyelitis. Will also d/c antibiotics and observe clinically how the patient responds. Will consider L foot MRI in AM if labs/studies return abnormal. Will order wound care consult for dressings and recommendations regarding proper foot wear for the patient while he awaits follow up with [place] IV antibiotics, reevaluate in morning -Follow up ESR/CRP, consider MRI if ESR/CRP abnormal -Follow up ABI  -Wound care consult, appreciate recommendations regarding dressing and proper footwear  T2DM, insulin dependent: Patient on metformin, glipizide, and lantus at home. Will continue lantus while inpatient and replace oral with mealtime sliding scale insulin while inpatient. Will  -Lantus 10 units, SSI with meals -Follow up Hemoglobin A1C  Fluids: None VTE prophylaxis: Lovenox Diet: Carb modified  Dispo: Admit patient to Observation with expected length of stay  less than 2 midnights.  SignedThomasene Ripple, MD 12/05/2016, 8:24 PM  Pager: [phone]

## 2016-12-05 NOTE — ED Provider Notes (Signed)
[facility] DEPT Provider Note   CSN: [id number] Arrival date & time: 12/05/16  1111     History   Chief Complaint Chief Complaint  Patient presents with  . Foot Injury    HPI Jonathan [name] is a 35 y.o. male with a history of diabetes type 2, medical noncompliance, diabetic neuropathy, who presents today for evaluation of a wound on his foot. He reports that the wound originally occurred back in June when he was walking in a pool and cut the bottom of his left great toe. Chart review shows that he was seen in September at urgent care and referred to see Dr. Amalia Hailey at [facility] but has not been there yet.  He was given an rx for keflex which he reports he took.  He denies pain in the toe, however reports that his left foot is sore.  He says that he has been experiencing continued drainage since then.  He denies fevers or chills.  His tetanus is up to date.   HPI  Past Medical History:  Diagnosis Date  . Diabetes mellitus     Patient Active Problem List   Diagnosis Date Noted  . Diabetic ulcer of left foot ([place]) 12/05/2016  . Diabetic neuropathy ([place]) 10/16/2015  . Erectile dysfunction 08/05/2015  . Low testosterone 09/12/2014  . DM type 2, uncontrolled, with neuropathy ([place]) 06/12/2014  . Diabetes mellitus, type II ([place]) 03/23/2012  . Noncompliance 03/23/2012  . Gastroenteritis and colitis, viral 03/23/2012    History reviewed. No pertinent surgical history.     Home Medications    Prior to Admission medications   Medication Sig Start Date End Date Taking? Authorizing Provider  atorvastatin (LIPITOR) 40 MG tablet Take 1 tablet (40 mg total) by mouth daily. 04/12/16  Yes Arnoldo Morale, MD  Blood Glucose Monitoring Suppl (TRUE METRIX METER) DEVI 1 each by Does not apply route 3 (three) times daily before meals. 04/12/16  Yes Arnoldo Morale, MD  cephALEXin (KEFLEX) 500 MG capsule Take 1 capsule (500 mg total) by mouth 4 (four) times daily. 11/23/16  Yes Mabe,  Shanon Brow, NP  gabapentin (NEURONTIN) 300 MG capsule Take 1 capsule (300 mg total) by mouth 2 (two) times daily. 04/12/16  Yes Arnoldo Morale, MD  glipiZIDE (GLUCOTROL XL) 10 MG 24 hr tablet Take 1 tablet (10 mg total) by mouth daily with breakfast. 04/12/16  Yes Amao, Charlane Ferretti, MD  glucose blood (TRUE METRIX BLOOD GLUCOSE TEST) test strip Used 3 times before meals 04/12/16  Yes Amao, Enobong, MD  glucose blood test strip Use as instructed 06/12/14  Yes [name] A, NP  glucose monitoring kit (FREESTYLE) monitoring kit 1 each by Does not apply route as needed. 06/12/14  Yes Lance Bosch, NP  ibuprofen (ADVIL,MOTRIN) 800 MG tablet Take 1 tablet (800 mg total) by mouth 3 (three) times daily. 04/01/15  Yes Domenic Moras, PA-C  Insulin Glargine (LANTUS SOLOSTAR) 100 UNIT/ML Solostar Pen Inject 10 Units into the skin daily at 10 pm. 04/12/16  Yes Arnoldo Morale, MD  Lancets (FREESTYLE) lancets Use as instructed 06/12/14  Yes Lance Bosch, NP  metFORMIN (GLUCOPHAGE) 1000 MG tablet Take 1 tablet (1,000 mg total) by mouth 2 (two) times daily. 04/12/16  Yes Arnoldo Morale, MD  TRUEPLUS LANCETS 28G MISC 1 each by Does not apply route 3 (three) times daily before meals. 04/12/16  Yes Arnoldo Morale, MD    Family History Family History  Problem Relation Age of Onset  . Family history  unknown: Yes    Social History Social History  Substance Use Topics  . Smoking status: Never Smoker  . Smokeless tobacco: Never Used  . Alcohol use 1.2 oz/week    1 Glasses of wine, 1 Cans of beer per week     Comment: occassional     Allergies   Patient has no known allergies.   Review of Systems Review of Systems  Constitutional: Negative for chills and fever.  HENT: Negative for ear pain and sore throat.   Eyes: Negative for pain and visual disturbance.  Respiratory: Negative for cough and shortness of breath.   Cardiovascular: Negative for chest pain and palpitations.  Gastrointestinal: Negative for abdominal pain,  nausea and vomiting.  Endocrine: Negative for polydipsia and polyuria.  Genitourinary: Negative for dysuria and hematuria.  Musculoskeletal: Positive for arthralgias and gait problem. Negative for back pain.  Skin: Positive for color change and wound. Negative for rash.  Neurological: Negative for seizures and syncope.  All other systems reviewed and are negative.    Physical Exam Updated Vital Signs BP 130/83 (BP Location: Right Arm)   Pulse 82   Temp 98 F (36.7 C) (Oral)   Resp 16   Ht _0  (2.032 m)   Wt 116.6 kg (257 lb 1 oz)   SpO2 100%   BMI 28.24 kg/m   Physical Exam  Constitutional: He appears well-developed and well-nourished. No distress.  HENT:  Head: Normocephalic and atraumatic.  Eyes: Conjunctivae are normal. Right eye exhibits no discharge. Left eye exhibits no discharge. No scleral icterus.  Neck: Normal range of motion.  Cardiovascular: Normal rate, regular rhythm, normal heart sounds and intact distal pulses.   Pulmonary/Chest: Effort normal. No stridor. No respiratory distress.  Abdominal: He exhibits no distension.  Musculoskeletal: He exhibits no edema or deformity.  Neurological: He is alert. A sensory deficit (Patient does not report pain in his great toe.  Has pain and sensation on left foot. ) is present. He exhibits normal muscle tone.  Skin: Skin is warm and dry. He is not diaphoretic.  Large wound to bottom of left great toe.  Surrounding skin is warm, red.  There is swelling and obvious necrotic tissue.  There is obvious exudate dried around the wound.  Wound does not appear clean and there is grass in his wound.  After debridement wound was probed, at least 1cm deep.   Please see pictures.   Psychiatric: He has a normal mood and affect. His behavior is normal.  Nursing note and vitals reviewed.    Left great toe before debridement    Left great toe after blunt debridement, cleaning, removal of dried discharge.      ED Treatments /  Results  Labs (all labs ordered are listed, but only abnormal results are displayed) Labs Reviewed  COMPREHENSIVE METABOLIC PANEL - Abnormal; Notable for the following:       Result Value   Glucose, Bld 310 (*)    BUN 5 (*)    Total Protein 8.6 (*)    All other components within normal limits  CBC WITH DIFFERENTIAL/PLATELET - Abnormal; Notable for the following:    RBC 6.51 (*)    MCH 25.5 (*)    All other components within normal limits  URINALYSIS, ROUTINE W REFLEX MICROSCOPIC - Abnormal; Notable for the following:    Glucose, UA >=500 (*)    Ketones, ur 5 (*)    All other components within normal limits  GLUCOSE, CAPILLARY - Abnormal; Notable  for the following:    Glucose-Capillary 285 (*)    All other components within normal limits  SEDIMENTATION RATE  C-REACTIVE PROTEIN  HIV ANTIBODY (ROUTINE TESTING)  CBC  BASIC METABOLIC PANEL  HEMOGLOBIN A1C  I-STAT CG4 LACTIC ACID, ED  I-STAT CG4 LACTIC ACID, ED    EKG  EKG Interpretation None       Radiology Dg Foot Complete Left  Result Date: 12/05/2016 CLINICAL DATA:  Great toe pain with ulcer. Evaluate for osteomyelitis. EXAM: LEFT FOOT - COMPLETE 3+ VIEW COMPARISON:  None. FINDINGS: Soft tissue ulceration along the plantar aspect of the great toe. No acute fracture or malalignment. No osteolysis. Joint spaces are preserved. Bone mineralization is normal. Small plantar enthesophyte. IMPRESSION: Soft tissue ulceration along the plantar aspect of the great toe. No radiographic evidence of osteomyelitis. Electronically Signed   By: Titus Dubin M.D.   On: 12/05/2016 15:27    Procedures Irrigation and debridement Date/Time: 12/05/2016 3:52 PM Performed by: Lorin Glass Authorized by: Lorin Glass  Consent: Verbal consent obtained. Risks and benefits: risks, benefits and alternatives were discussed Consent given by: patient Local anesthesia used: no (Patient denied pain)  Anesthesia: Local anesthesia  used: no (Patient denied pain) Patient tolerance: Patient tolerated the procedure well with no immediate complications Comments: Debridement of grass, dried blood, and necrotic tissue using iodine scrub brush, forceps, and sterile saline.      Medications Ordered in ED Medications  enoxaparin (LOVENOX) injection 40 mg (40 mg Subcutaneous Given 12/05/16 2251)  insulin aspart (novoLOG) injection 0-15 Units (not administered)  insulin glargine (LANTUS) injection 10 Units (10 Units Subcutaneous Given 12/05/16 2251)  acetaminophen (TYLENOL) tablet 650 mg (650 mg Oral Given 12/06/16 0007)  piperacillin-tazobactam (ZOSYN) IVPB 3.375 g (0 g Intravenous Stopped 12/05/16 1857)  sodium chloride 0.9 % bolus 1,000 mL (1,000 mLs Intravenous New Bag/Given 12/05/16 1827)     Initial Impression / Assessment and Plan / ED Course  I have reviewed the triage vital signs and the nursing notes.  Pertinent labs & imaging results that were available during my care of the patient were reviewed by me and considered in my medical decision making (see chart for details).  Clinical Course as of Dec 07 50  Mon Dec 05, 2016  1630 Spoke with hospitalist who will come see patient.   [EH]    Clinical Course User Index [EH] Lorin Glass, PA-C   Jonathan [name] presents for evaluation of left foot toe pain and wound.  He is diabetic and reports compliance with his medications however was found to be hyperglycemic with a glucose of 310.  Left Great toe required debridement, and cleaning of dried blood, exudates, and plant material to adequately view wound which he reports occurred in June while walking in a pool.  Based on appearance of wound and uncontrolled diabetes along with failed outpatient therapy (Keflex) I feel that patient needs admission for IV antibiotics and sugar control.  Orders were placed for fluid, zosyn IV.  Tdap is up to date.  I consulted hospitalist for admission who says they will come see  patient.    The patient appears reasonably stabilized for admission considering the current resources, flow, and capabilities available in the ED at this time, and I doubt any other [facility] requiring further screening and/or treatment in the ED prior to admission.  This patient was seen as a shared visit with Dr. Laverta Baltimore who evaluated the patient and agrees with my plan.   Final  Clinical Impressions(s) / ED Diagnoses   Final diagnoses:  Injury of left foot, initial encounter  Diabetic ulcer of toe of left foot associated with type 2 diabetes mellitus, with other ulcer severity [facility])    New Prescriptions Current Discharge Medication List       Ollen Gross 12/06/16 0856    Margette Fast, MD 12/06/16 [phone]

## 2016-12-05 NOTE — ED Triage Notes (Signed)
Pt reports cutting the bottom of his left big toe a little over 1 month ago, seen at [facility] and referred to foot doctor but cannot be seen til next month, bottom of toe black. Pt reports some swelling to foot when he stands at work. Denies fever or chills. Hx of diabetes.

## 2016-12-06 ENCOUNTER — Observation Stay (HOSPITAL_BASED_OUTPATIENT_CLINIC_OR_DEPARTMENT_OTHER): Payer: Self-pay

## 2016-12-06 DIAGNOSIS — R739 Hyperglycemia, unspecified: Secondary | ICD-10-CM | POA: Diagnosis present

## 2016-12-06 DIAGNOSIS — E11621 Type 2 diabetes mellitus with foot ulcer: Secondary | ICD-10-CM

## 2016-12-06 DIAGNOSIS — E1165 Type 2 diabetes mellitus with hyperglycemia: Secondary | ICD-10-CM

## 2016-12-06 DIAGNOSIS — L97529 Non-pressure chronic ulcer of other part of left foot with unspecified severity: Secondary | ICD-10-CM

## 2016-12-06 DIAGNOSIS — Z794 Long term (current) use of insulin: Secondary | ICD-10-CM

## 2016-12-06 DIAGNOSIS — E1140 Type 2 diabetes mellitus with diabetic neuropathy, unspecified: Secondary | ICD-10-CM

## 2016-12-06 LAB — GLUCOSE, CAPILLARY
Glucose-Capillary: 300 mg/dL — ABNORMAL HIGH (ref 65–99)
Glucose-Capillary: 267 mg/dL — ABNORMAL HIGH (ref 65–99)

## 2016-12-06 LAB — BASIC METABOLIC PANEL
Anion gap: 10 (ref 5–15)
BUN: 11 mg/dL (ref 6–20)
CO2: 26 mmol/L (ref 22–32)
Calcium: 9.4 mg/dL (ref 8.9–10.3)
Chloride: 100 mmol/L — ABNORMAL LOW (ref 101–111)
Creatinine, Ser: 0.97 mg/dL (ref 0.61–1.24)
GFR calc Af Amer: >60 mL/min (ref >60)
GLUCOSE: 337 mg/dL — AB (ref 65–99)
Potassium: 4.1 mmol/L (ref 3.5–5.1)
SODIUM: 136 mmol/L (ref 135–145)

## 2016-12-06 LAB — CBC
HCT: 44 % (ref 39.0–52.0)
Hemoglobin: 14.3 g/dL (ref 13.0–17.0)
MCH: 25.4 pg — AB (ref 26.0–34.0)
MCHC: 32.5 g/dL (ref 30.0–36.0)
MCV: 78 fL (ref 78.0–100.0)
PLATELETS: 154 10*3/uL (ref 150–400)
RBC: 5.64 MIL/uL (ref 4.22–5.81)
RDW: 13.2 % (ref 11.5–15.5)
WBC: 6.5 10*3/uL (ref 4.0–10.5)

## 2016-12-06 LAB — HEMOGLOBIN A1C
Hgb A1c MFr Bld: 11.8 % — ABNORMAL HIGH (ref 4.8–5.6)
Mean Plasma Glucose: 291.96 mg/dL

## 2016-12-06 LAB — HIV ANTIBODY (ROUTINE TESTING W REFLEX): HIV Screen 4th Generation wRfx: NONREACTIVE

## 2016-12-06 MED ORDER — MUPIROCIN CALCIUM 2 % EX CREA: TOPICAL_CREAM | Freq: Every day | CUTANEOUS | 0 refills | Status: DC

## 2016-12-06 MED ORDER — INFLUENZA VAC SPLIT QUAD 0.5 ML IM SUSY: 0.5000 mL | PREFILLED_SYRINGE | INTRAMUSCULAR | Status: DC

## 2016-12-06 MED ORDER — MUPIROCIN 2 % EX OINT
TOPICAL_OINTMENT | CUTANEOUS | Status: AC
  Administered 2016-12-06: 11:00:00
  Filled 2016-12-06: qty 22

## 2016-12-06 MED ORDER — INSULIN GLARGINE 100 UNIT/ML SOLOSTAR PEN: 15.0000 [IU] | PEN_INJECTOR | Freq: Every day | SUBCUTANEOUS | 3 refills | Status: DC

## 2016-12-06 MED ORDER — MUPIROCIN CALCIUM 2 % EX CREA
TOPICAL_CREAM | Freq: Every day | CUTANEOUS | Status: DC
  Filled 2016-12-06: qty 15

## 2016-12-06 MED ORDER — PNEUMOCOCCAL VAC POLYVALENT 25 MCG/0.5ML IJ INJ: 0.5000 mL | INJECTION | INTRAMUSCULAR | Status: DC

## 2016-12-06 NOTE — Progress Notes (Signed)
VASCULAR LAB PRELIMINARY  ARTERIAL  ABI completed: Right and left ABI's of 1.18 are suggestive of arterial flow within normal limits at rest.   RIGHT    LEFT    PRESSURE WAVEFORM  PRESSURE WAVEFORM  BRACHIAL 124 Triphasic BRACHIAL 130 Triphasic  DP 154 Triphasic DP 154 Triphasic  PT 141 Triphasic PT 148 Triphasic    RIGHT LEFT  ABI 1.18 1.18    Elsie Stain, RVT 12/06/2016, 8:36 AM

## 2016-12-06 NOTE — Discharge Summary (Signed)
Name: Jonathan [name] MRN: [id number] DOB: February 07, 1981 37 y.o. PCP: Arnoldo Morale, MD  Date of Admission: 12/05/2016  2:11 PM Date of Discharge: 12/06/2016 Attending Physician: No att. providers found  Discharge Diagnosis:  Principal Problem:   Diabetic ulcer of left foot ([place]) Active Problems:   DM type 2, uncontrolled, with neuropathy ([place])   Hyperglycemia   Discharge Medications: Allergies as of 12/06/2016   No Known Allergies     Medication List    STOP taking these medications   cephALEXin 500 MG capsule Commonly known as:  KEFLEX     TAKE these medications   atorvastatin 40 MG tablet Commonly known as:  LIPITOR Take 1 tablet (40 mg total) by mouth daily.   freestyle lancets Use as instructed   TRUEPLUS LANCETS 28G Misc 1 each by Does not apply route 3 (three) times daily before meals.   gabapentin 300 MG capsule Commonly known as:  NEURONTIN Take 1 capsule (300 mg total) by mouth 2 (two) times daily.   glipiZIDE 10 MG 24 hr tablet Commonly known as:  GLUCOTROL XL Take 1 tablet (10 mg total) by mouth daily with breakfast.   glucose blood test strip Use as instructed   glucose blood test strip Commonly known as:  TRUE METRIX BLOOD GLUCOSE TEST Used 3 times before meals   glucose monitoring kit monitoring kit 1 each by Does not apply route as needed.   TRUE METRIX METER [place]i 1 each by Does not apply route 3 (three) times daily before meals.   ibuprofen 800 MG tablet Commonly known as:  ADVIL,MOTRIN Take 1 tablet (800 mg total) by mouth 3 (three) times daily.   Insulin Glargine 100 UNIT/ML Solostar Pen Commonly known as:  LANTUS SOLOSTAR Inject 15 Units into the skin daily at 10 pm. What changed:  how much to take   metFORMIN 1000 MG tablet Commonly known as:  GLUCOPHAGE Take 1 tablet (1,000 mg total) by mouth 2 (two) times daily.   mupirocin cream 2 % Commonly known as:  BACTROBAN Apply topically daily.       Disposition and  follow-up:   Jonathan [name] was discharged from [facility] in Good condition.  At the hospital follow up visit please address:  1.  Patient was admitted for evaluation of L great toe ulcer, which has been chronically draining non-purulent bloody fluid and causing severe pain while standing during work. Patient's overall clinical picture was reassuring, as his vital signs were within normal limits, physcial exam did not reveal signs/symptoms of infection, and lab work/imaging studies did not suggest underlying osteomyelitis. Patient was given instructions on proper wound care and follow up with podiatry arranged for next day. Please assess patient's wound healing and reassess for local and systemic signs of infection.   Patient's BG was elevated on admission and Hgb A1C found to be 11.8%. Patient told to increase lantus from 10 units nightly to 15 units nightly to address morning hyperglycemia (BG between 200-300 every morning per patient report). Patient instructed to follow up with PCP regarding management of diabetes.   2.  Labs / imaging needed at time of follow-up: CBG  3.  Pending labs/ test needing follow-up: None  Follow-up Appointments: Follow-up [place] Follow up.   Why:  December 12, 2016 at [place] information: [place] [id number] [phone]       [facility]. Go on 12/07/2016.  Why:  You have an appointment with Dr. Amalia Hailey on 12/07/2016 at 2:30 pm. Please arrive 15 minutes early.           Hospital Course by problem list: Principal Problem:   Diabetic ulcer of left foot ([place]) Active Problems:   DM type 2, uncontrolled, with neuropathy ([place])   Hyperglycemia   Jonathan [name] is a 36 yo with a PMH of insulin dependent U0RV complicated by diabetic neuropathy who is presenting with a multi-month history of a wound on the plantar aspect of his L great toe.  The patient was evaluated in the ED where a I&D was performed and he was given IV antibiotics with concern for underlying osteomyelitis. He was admitted to the internal medicine teaching service with wound care consulting for management. The specific problems addressed during his admission are as follows:  L great toe ulcer, concerning for diabetic neuropathic ulcer:The patient's foot X ray obtained on admission showed soft tissue swelling and without signs of underlying osteomyelitis. The patient does not have clinical signs of cellulitis, no systemic signs of fever, no pain with palpation, no purulent drainage, and no limited ROM suggesting underlying osteomyelitis. Given his reassuring vital signs and findings on physical exam, there was no concern for infection that would necessitate further antibiotic therapy or further workup for osteomyelitis. The patient was evaluated by wound care nurse and instructed on how to properly care for his wound until follow up with podiatry. Upon discharge he was told to continue wound care recommendations, encourage better blood glucose control with PCP to promote wound healing, and reschedule close follow up with [place] as outpatient the day after discharge.  T2DM, insulin dependent: The patient's home medication list showed on metformin, glipizide, and Lantus 10 units nightly. The patient reports good compliance with measuring his BG, stating he measures it twice a day. He was observed to have an elevated BG (>300) the morning after admission, which is consistent with his historical morning glucose recordings of >220. His Hgb A1C on admission was 11.8%, suggesting he needs tighter glucose control for wound healing. Patient was instructed to increase Lantus to 15 units nightly and to follow up with PCP regarding his medication changes.   Discharge Vitals:   BP 130/76 (BP Location: Right Arm)   Pulse 85   Temp 98.1 F (36.7 C) (Oral)   Resp 18   Ht '6\' 8"'   (2.032 m)   Wt 257 lb 1 oz (116.6 kg)   SpO2 100%   BMI 28.24 kg/m   Pertinent Labs, Studies, and Procedures:  BMP Latest Ref Rng & Units 12/06/2016 12/05/2016 04/12/2016  Glucose 65 - 99 mg/dL 337(H) 310(H) 338(H)  BUN 6 - 20 mg/dL 11 5(L) 9  Creatinine 0.61 - 1.24 mg/dL 0.97 0.84 0.95  Sodium 135 - 145 mmol/L 136 137 140  Potassium 3.5 - 5.1 mmol/L 4.1 5.1 3.7  Chloride 101 - 111 mmol/L 100(L) 101 105  CO2 22 - 32 mmol/L '26 27 25  ' Calcium 8.9 - 10.3 mg/dL 9.4 9.8 9.3   ESR = 1 CRP <0.8 Hemoglobin A1C = 11.8% HIV antibody = Nonreactive  Left Foot 3+View Complete FINDINGS: Soft tissue ulceration along the plantar aspect of the great toe. No acute fracture or malalignment. No osteolysis. Joint spaces are preserved. Bone mineralization is normal. Small plantar enthesophyte.  IMPRESSION: Soft tissue ulceration along the plantar aspect of the great toe. No radiographic evidence of osteomyelitis.   Discharge Instructions: Discharge Instructions  Call MD for:  persistant dizziness or light-headedness    Complete by:  As directed    Call MD for:  persistant nausea and vomiting    Complete by:  As directed    Call MD for:  redness, tenderness, or signs of infection (pain, swelling, redness, odor or green/yellow discharge around incision site)    Complete by:  As directed    Call MD for:  temperature >100.4    Complete by:  As directed    Diet - low sodium heart healthy    Complete by:  As directed    Discharge instructions    Complete by:  As directed    You were evaluated for a foot ulcer on your left great toe. We do not think you have a bone infection or a skin infection as a result of this ulcer. You can stop taking the antibiotics previously prescribed to you. Please continue wound care for this ulcer as recommended by nursing staff during your hospitalization. We have scheduled follow up with your foot doctor, [place], for Wednesday 12/07/2016. Please attend  this appointment.   You will also need to meet with your PCP regarding your hospitalization. You were instructed to increase your insulin to 15 units per night instead of 10 units per night. Please follow up with your PCP at your earliest convenience. Improving your blood glucose control will help with healing of your foot wound.   Increase activity slowly    Complete by:  As directed       Signed: Thomasene Ripple, MD 12/06/2016, 7:28 PM   Pager: [phone]

## 2016-12-06 NOTE — Progress Notes (Signed)
Discharge instruction reviewed with patient, he verbalized understanding. [facility] sent home patient.

## 2016-12-06 NOTE — Progress Notes (Addendum)
   Subjective:  Patient seen laying comfortably in bed this morning in no acute distress. He denied worsening pain in his L great toe and any swelling, fevers, and/or overlying skin changes. He feels well and states that he is comfortable taking care of his wound if he gets proper instructions  Objective:  Vital signs in last 24 hours: Vitals:   12/05/16 1915 12/05/16 1930 12/05/16 2031 12/06/16 0625  BP: 103/64 123/85 130/83 125/85  Pulse: 77 91 82 84  Resp:   16 16  Temp:   98 F (36.7 C) 97.7 F (36.5 C)  TempSrc:   Oral Oral  SpO2: 100% 100% 100% 100%  Weight:      Height:       Physical Exam  Constitutional: He appears well-developed and well-nourished. No distress.  Cardiovascular: Normal rate, regular rhythm and intact distal pulses.  Exam reveals no friction rub.   No murmur heard. Pulmonary/Chest: Effort normal. No respiratory distress. He has no wheezes.  Abdominal: Soft. He exhibits no distension. There is no tenderness. There is no guarding.  Musculoskeletal: He exhibits no edema (of bilateral lower extremities) or tenderness (of bilateral lower extremities).  Well circumscribed 1-1.5cm round callus with central area of ulceration and dark, black necrotic tissue on plantar aspect of L great toe. No purulent drainage or active bloody discharge. Stable from previous examinations (see chart for pictures).   Skin: Skin is warm and dry. No erythema. No pallor.   Assessment/Plan:  Active Problems:   Diabetic ulcer of left foot ([place])  Jonathan [name] is a 36 yo with a PMH of insulin dependent S0YT complicated by diabetic neuropathy who is presenting with a multi-month history of a wound on the plantar aspect of his L great toe. The patient was evaluated in the ED where a I&D was performed and he was given IV antibiotics with concern for underlying osteomyelitis. He was admitted to the internal medicine teaching service with wound care consulting for management. The specific  problems addressed during his admission are as follows:  L great toe ulcer, concerning for diabetic neuropathic ulcer: The patient's foot X ray obtained on admission showed soft tissue swelling and without signs of underlying osteomyelitis. The patient does not have clinical signs of cellulitis, no systemic signs of fever, no pain with palpation, no purulent drainage, and no limited ROM suggesting underlying osteomyelitis. Given his reassuring vital signs and findings on physical exam, I do not think patient has infection that would necessitate further antibiotic therapy or evaluation for osteomyelitis. Will continue wound care recommendations, encourage better blood glucose control with PCP to promote wound healing, and reschedule close follow up with [place] as outpatient upon discharge. -Wound care consulting, appreciate recommendations -ESR/CRP within normal limits, will defer L foot MRI today as discussed above -ABI within normal limits today -Wound care consult, appreciate recommendations regarding dressing and proper footwear  T2DM, insulin dependent: Patient on metformin, glipizide, and Lantus 10 units nightly at home. Patient's Hgb A1C today 11.8% with elevated BG this AM of >300. The patient reports home AM blood glucose >220, suggesting he needs increased Lantus each night. Will plan to increase Lantus to 15 units qNightly on discharge. -Lantus 15 units nightly  Fluids: None VTE prophylaxis: Lovenox Diet: Carb modified  Dispo: Anticipated discharge today.   Thomasene Ripple, MD 12/06/2016, 1:49 PM Pager: [phone]

## 2016-12-06 NOTE — Progress Notes (Addendum)
Inpatient Diabetes Program Recommendations  AACE/ADA: New Consensus Statement on Inpatient Glycemic Control (2015)  Target Ranges:  Prepandial:   less than 140 mg/dL      Peak postprandial:   less than 180 mg/dL (1-2 hours)      Critically ill patients:  140 - 180 mg/dL  Results for Jonathan [name], Jonathan [name] (MRN [id number]) as of 12/06/2016 13:01  Ref. Range 09/25/2013 07:29 01/06/2014 01:44 12/05/2016 21:17 12/06/2016 07:53 12/06/2016 12:17  Glucose-Capillary Latest Ref Range: 65 - 99 mg/dL 409 (H) 811 (H) 914 (H) 300 (H) 267 (H)   Results for Jonathan [name], Jonathan [name] (MRN [id number]) as of 12/06/2016 13:01  Ref. Range 04/12/2016 15:17 12/06/2016 03:16  Hemoglobin A1C Latest Ref Range: 4.8 - 5.6 % 14.9 11.8 (H)   Review of Glycemic Control  Diabetes history: DM2 Outpatient Diabetes medications: Glipizide XL 10 mg QAM, Lantus 10 units QHS, Metformin 1000 mg BID Current orders for Inpatient glycemic control: Lantus 10 units QHS, Novolog 0-15 units TID with meals  Inpatient Diabetes Program Recommendations: Insulin - Basal: Please consider increasing Lantus to 17 units QHS (based on 116 kg x 0.15 units). HgbA1C: A1C 11.8% on 12/06/16 indicating an average glucose of 292 mg/dl over the past 2-3 months. Recommend increasing outpatient Lantus dose at time of discharge.  NOTE: In reviewing chart, noted patient goes to [facility]) for medical care and prescriptions. Per chart review, A1C was 14.9% on 04/12/16 and patient was started on Lantus 10 units QHS at that time (was already on Glipizide and Metformin) and is current prescribed dose of Lantus. A1C has improved from 14.9% to 11.8%; however, A1C is not at goal. Patient received Lantus 10 units last night and fasting glucose 300 mg/dl this morning. Anticipate, Lantus dose needs to be increased as an inpatient as well as an outpatient to further improve DM control.   Spoke with patient (over phone; Diabetes Coordinator working from [employer]) about diabetes and home regimen for diabetes control. Patient reports that he is followed by [facility] for diabetes management and currently he takes Lantus 10 units QHS, Glipizide XL 10 mg QAM, and Metformin 1000 mg BID as an outpatient for diabetes control. Patient reports that he is taking all DM medications as prescribed. Patient reports that he is able to get everything he needs for DM management from the [facility]. Patient confirms that he has been on the same dose of Lantus since he was prescribed Lantus in February.  Patient states that he checks his glucose 2 times per day (in the morning and at bedtime) and that it is usually in the 200's mg/dl in the morning and in the mid 100's mg/dl at bedtime. Discussed A1C results (11.8% on 12/06/16) and explained that his current A1C indicates an average glucose of 292 mg/dl over the past 2-3 months. Explained that A1C has improved from 14.9% on 04/12/16 down to 11.8% and need to continue to improve A1C and overall DM control. Discussed glucose and A1C goals. Discussed importance of checking CBGs and maintaining good CBG control to prevent long-term and short-term complications. Explained how hyperglycemia leads to damage within blood vessels which lead to the common complications seen with uncontrolled diabetes. Stressed to the patient the importance of improving glycemic control to prevent further complications from uncontrolled diabetes. Informed patient of recommendation for MD to increase Lantus but explained it would be up to the doctor to determine if actually increased or not.  Encouraged patient to check his glucose as prescribed  by MD and to keep a log book of glucose readings and insulin taken which he will need to take to doctor appointments. Explained how the doctor he follows up with can use the log book to continue to make adjustments with DM medications if needed. Patient verbalized understanding of information discussed and he states that he has no  further questions at this time related to diabetes.  Thanks, Orlando Penner, RN, MSN, CDE Diabetes Coordinator Inpatient Diabetes Program [phone] (Team Pager from 8am to 5pm)

## 2016-12-06 NOTE — Consult Note (Addendum)
WOC Nurse wound consult note Reason for Consult: Consult requested for left great toe wound. Wound type: Full thickness to plantar great toe Measurement: 1X.8X.3cm Wound bed: Dark brown wound bed, surrounded by dry dark colored callous to edges Drainage (amount, consistency, odor) Small amt tan drainage, no odor Periwound: Generalized edema surrounding the wound Dressing procedure/placement/frequency: Bactroban to provide antimicrobial benefits and promote moist healing.  Reviewed plan of care with patient since he will be discharging today and he verbalized understanding. Please re-consult if further assistance is needed.  Thank-you,  Cammie Mcgee MSN, RN, CWOCN, [place], CNS [phone]

## 2016-12-06 NOTE — Care Management Note (Signed)
Case Management Note  Patient Details  Name: Cyree Chuong MRN: [id number] Date of Birth: [birth year]  Subjective/Objective:                    Action/Plan:  Spoke to patient at bedside. Patient is active with [facility], has appointment scheduled for December 12, 2016 at 1015. Patient sees DR Venetia Night .   Patient can get prescriptions filled at discharge at [facility]. No MATCH letter needed.  Expected Discharge Date:  12/07/16               Expected Discharge Plan:  Home/Self Care  In-House Referral:     Discharge planning Services  CM Consult  Post Acute Care Choice:    Choice offered to:  Patient  DME Arranged:    DME Agency:     HH Arranged:    HH Agency:     Status of Service:  Completed, signed off  If discussed at [employer] of Stay Meetings, dates discussed:    Additional Comments:  Kingsley Plan, RN 12/06/2016, 11:32 AM

## 2016-12-07 ENCOUNTER — Ambulatory Visit: Payer: MEDICAID | Admitting: Podiatry

## 2016-12-12 ENCOUNTER — Ambulatory Visit: Payer: Self-pay | Attending: Family Medicine | Admitting: Family Medicine

## 2016-12-12 ENCOUNTER — Encounter: Payer: Self-pay | Admitting: Family Medicine

## 2016-12-12 VITALS — BP 104/71 | HR 90 | Temp 98.2°F | Ht >= 80 in | Wt 260.6 lb

## 2016-12-12 DIAGNOSIS — L97522 Non-pressure chronic ulcer of other part of left foot with fat layer exposed: Secondary | ICD-10-CM

## 2016-12-12 DIAGNOSIS — R7989 Other specified abnormal findings of blood chemistry: Secondary | ICD-10-CM

## 2016-12-12 DIAGNOSIS — E1165 Type 2 diabetes mellitus with hyperglycemia: Secondary | ICD-10-CM | POA: Insufficient documentation

## 2016-12-12 DIAGNOSIS — N529 Male erectile dysfunction, unspecified: Secondary | ICD-10-CM | POA: Insufficient documentation

## 2016-12-12 DIAGNOSIS — Z79899 Other long term (current) drug therapy: Secondary | ICD-10-CM | POA: Insufficient documentation

## 2016-12-12 DIAGNOSIS — E11621 Type 2 diabetes mellitus with foot ulcer: Secondary | ICD-10-CM | POA: Insufficient documentation

## 2016-12-12 DIAGNOSIS — Z794 Long term (current) use of insulin: Secondary | ICD-10-CM | POA: Insufficient documentation

## 2016-12-12 DIAGNOSIS — S91109D Unspecified open wound of unspecified toe(s) without damage to nail, subsequent encounter: Secondary | ICD-10-CM | POA: Insufficient documentation

## 2016-12-12 DIAGNOSIS — E1140 Type 2 diabetes mellitus with diabetic neuropathy, unspecified: Secondary | ICD-10-CM | POA: Insufficient documentation

## 2016-12-12 DIAGNOSIS — IMO0002 Reserved for concepts with insufficient information to code with codable children: Secondary | ICD-10-CM

## 2016-12-12 DIAGNOSIS — L97529 Non-pressure chronic ulcer of other part of left foot with unspecified severity: Secondary | ICD-10-CM | POA: Insufficient documentation

## 2016-12-12 LAB — GLUCOSE, POCT (MANUAL RESULT ENTRY): POC Glucose: 346 mg/dl — AB (ref 70–99)

## 2016-12-12 MED ORDER — FUROSEMIDE 20 MG PO TABS: 20.0000 mg | ORAL_TABLET | Freq: Every day | ORAL | 1 refills | Status: DC

## 2016-12-12 MED ORDER — GABAPENTIN 300 MG PO CAPS: 600.0000 mg | ORAL_CAPSULE | Freq: Two times a day (BID) | ORAL | 3 refills | Status: DC

## 2016-12-12 MED ORDER — INSULIN ASPART 100 UNIT/ML ~~LOC~~ SOLN
6.0000 [IU] | Freq: Once | SUBCUTANEOUS | Status: AC
  Administered 2016-12-12: 6 [IU] via SUBCUTANEOUS

## 2016-12-12 NOTE — Patient Instructions (Signed)

## 2016-12-12 NOTE — Progress Notes (Signed)
Subjective:  Patient ID: Jonathan [name], male    DOB: [birth year]  Age: 36 y.o. MRN: [id number]  CC: Diabetes   HPI Jonathan [name]  is a 36 year old male with a history of type 2 diabetes mellitus (A1c 11.8), diabetic neuropathy, erectile dysfunction and testosterone deficiency who comes into the clinic for a follow-up visitAfter [facility] for diabetic ulcer of the left foot.[name]  He had presented with bloody discharge from nonhealing ulcer on the plantar aspect of his left big toe after he had stepped on a foreign object while in the pool on 08/31/16. ESR, CRP were negative. X-ray of the foot revealed soft tissue ulceration along the plantar aspect of the great toe, no radiographic evidence of osteomyelitis. He was placed on Keflex during his stay but not discharged on antibiotics due to the absence of osteomyelitis. Lantus dose was increased from 10 units to 15 units. An appointment with podiatry was scheduled for him.  He presents today denying drainage from his ulcer but states that he does have pedal edema with prolonged standing. He denies fever or pain in his foot. He suffers from diabetic neuropathy which he describes as feeling like he is "sliding" while walking and has remained on gabapentin. His appointment with podiatry comes up later this week. His blood sugar in the clinic is 346 and he endorses taking his Lantus this morning as he could not take it last night due to lack of power; NovoLog 6 units administered.  Past Medical History:  Diagnosis Date  . Diabetes mellitus     No past surgical history on file.  No Known Allergies   Outpatient Medications Prior to Visit  Medication Sig Dispense Refill  . atorvastatin (LIPITOR) 40 MG tablet Take 1 tablet (40 mg total) by mouth daily. 90 tablet 3  . Blood Glucose Monitoring Suppl (TRUE METRIX METER) DEVI 1 each by Does not apply route 3 (three) times daily before meals. 1 Device  0  . glipiZIDE (GLUCOTROL XL) 10 MG 24 hr tablet Take 1 tablet (10 mg total) by mouth daily with breakfast. 30 tablet 3  . glucose blood (TRUE METRIX BLOOD GLUCOSE TEST) test strip Used 3 times before meals 100 each 12  . glucose blood test strip Use as instructed 100 each 12  . glucose monitoring kit (FREESTYLE) monitoring kit 1 each by Does not apply route as needed. 1 each 0  . ibuprofen (ADVIL,MOTRIN) 800 MG tablet Take 1 tablet (800 mg total) by mouth 3 (three) times daily. 21 tablet 0  . Insulin Glargine (LANTUS SOLOSTAR) 100 UNIT/ML Solostar Pen Inject 15 Units into the skin daily at 10 pm. 5 pen 3  . Lancets (FREESTYLE) lancets Use as instructed 100 each 12  . metFORMIN (GLUCOPHAGE) 1000 MG tablet Take 1 tablet (1,000 mg total) by mouth 2 (two) times daily. 60 tablet 3  . mupirocin cream (BACTROBAN) 2 % Apply topically daily. 15 g 0  . TRUEPLUS LANCETS 28G MISC 1 each by Does not apply route 3 (three) times daily before meals. 100 each 12  . gabapentin (NEURONTIN) 300 MG capsule Take 1 capsule (300 mg total) by mouth 2 (two) times daily. 60 capsule 3   No facility-administered medications prior to visit.     ROS Review of Systems  Constitutional: Negative for activity change and appetite change.  HENT: Negative for sinus pressure and sore throat.   Eyes: Negative for visual disturbance.  Respiratory: Negative for cough, chest tightness  and shortness of breath.   Cardiovascular: Negative for chest pain and leg swelling.  Gastrointestinal: Negative for abdominal distention, abdominal pain, constipation and diarrhea.  Endocrine: Negative.   Genitourinary: Negative for dysuria.  Musculoskeletal: Negative for joint swelling and myalgias.  Skin: Positive for wound. Negative for rash.  Allergic/Immunologic: Negative.   Neurological: Negative for weakness, light-headedness and numbness.  Psychiatric/Behavioral: Negative for dysphoric mood and suicidal ideas.    Objective:  BP 104/71    Pulse 90   Temp 98.2 F (36.8 C) (Oral)   Ht '6\' 8"'  (2.032 m)   Wt 260 lb 9.6 oz (118.2 kg)   SpO2 100%   BMI 28.63 kg/m   BP/Weight 12/12/2016 12/06/2016 16/10/6787  Systolic BP 381 017 -  Diastolic BP 71 76 -  Wt. (Lbs) 260.6 - 257.06  BMI 28.63 - 28.24      Physical Exam  Constitutional: He is oriented to person, place, and time. He appears well-developed and well-nourished.  Cardiovascular: Normal rate, normal heart sounds and intact distal pulses.   No murmur heard. Pulmonary/Chest: Effort normal and breath sounds normal. He has no wheezes. He has no rales. He exhibits no tenderness.  Abdominal: Soft. Bowel sounds are normal. He exhibits no distension and no mass. There is no tenderness.  Musculoskeletal: Normal range of motion.  Neurological: He is alert and oriented to person, place, and time.  Skin:  Right big toe  Dark colored plantar ulcer with scab, no discharge     Lab Results  Component Value Date   HGBA1C 11.8 (H) 12/06/2016    Assessment & Plan:   1. DM type 2, uncontrolled, with neuropathy ([place]) Uncontrolled with A1c of 11.8 CBG of 346-NovoLog 6 units administered and blood sugar repeated prior to discharge Increase Lantus dose by 5 units if fasting sugars remain above 200 - POCT glucose (manual entry) - insulin aspart (novoLOG) injection 6 Units; Inject 0.06 mLs (6 Units total) into the skin once. - gabapentin (NEURONTIN) 300 MG capsule; Take 2 capsules (600 mg total) by mouth 2 (two) times daily.  Dispense: 120 capsule; Refill: 3  2. Low testosterone Tried testosterone injections in the past with no improvement Received a prescription of AndroGel from another doctor which he was unable to purchase due to cost Plan is to refer him to urology given erection problems- lack of medical coverage at this time has been the limiting factor.  3. Diabetic ulcer of toe of left foot associated with type 2 diabetes mellitus, with fat layer exposed  ([place]) Dressing change performed in the clinic Keep appointment with Podiatry which comes up later this week.   Meds ordered this encounter  Medications  . insulin aspart (novoLOG) injection 6 Units  . gabapentin (NEURONTIN) 300 MG capsule    Sig: Take 2 capsules (600 mg total) by mouth 2 (two) times daily.    Dispense:  120 capsule    Refill:  3    Discontinue previous dose    Follow-up: Return in about 6 weeks (around 01/23/2017) for Left toe ulcer.   Arnoldo Morale MD

## 2016-12-21 ENCOUNTER — Ambulatory Visit: Payer: MEDICAID | Admitting: Podiatry

## 2016-12-22 NOTE — Progress Notes (Signed)
This encounter was created in error - please disregard.

## 2016-12-25 ENCOUNTER — Emergency Department (HOSPITAL_COMMUNITY): Payer: Self-pay

## 2016-12-25 ENCOUNTER — Inpatient Hospital Stay (HOSPITAL_COMMUNITY)
Admission: EM | Admit: 2016-12-25 | Discharge: 2016-12-30 | DRG: 638 | Disposition: A | Discharge status: Home / Self Care | Payer: Self-pay | Attending: Internal Medicine | Admitting: Internal Medicine

## 2016-12-25 ENCOUNTER — Encounter (HOSPITAL_COMMUNITY): Payer: Self-pay | Admitting: Nurse Practitioner

## 2016-12-25 DIAGNOSIS — E08621 Diabetes mellitus due to underlying condition with foot ulcer: Secondary | ICD-10-CM

## 2016-12-25 DIAGNOSIS — R739 Hyperglycemia, unspecified: Secondary | ICD-10-CM

## 2016-12-25 DIAGNOSIS — Z79899 Other long term (current) drug therapy: Secondary | ICD-10-CM

## 2016-12-25 DIAGNOSIS — L03116 Cellulitis of left lower limb: Secondary | ICD-10-CM

## 2016-12-25 DIAGNOSIS — E1140 Type 2 diabetes mellitus with diabetic neuropathy, unspecified: Secondary | ICD-10-CM | POA: Diagnosis present

## 2016-12-25 DIAGNOSIS — IMO0002 Reserved for concepts with insufficient information to code with codable children: Secondary | ICD-10-CM

## 2016-12-25 DIAGNOSIS — L97529 Non-pressure chronic ulcer of other part of left foot with unspecified severity: Secondary | ICD-10-CM | POA: Diagnosis present

## 2016-12-25 DIAGNOSIS — E118 Type 2 diabetes mellitus with unspecified complications: Secondary | ICD-10-CM | POA: Diagnosis present

## 2016-12-25 DIAGNOSIS — E11621 Type 2 diabetes mellitus with foot ulcer: Principal | ICD-10-CM | POA: Diagnosis present

## 2016-12-25 DIAGNOSIS — L0390 Cellulitis, unspecified: Secondary | ICD-10-CM | POA: Diagnosis present

## 2016-12-25 DIAGNOSIS — E1165 Type 2 diabetes mellitus with hyperglycemia: Secondary | ICD-10-CM | POA: Diagnosis present

## 2016-12-25 DIAGNOSIS — Z7984 Long term (current) use of oral hypoglycemic drugs: Secondary | ICD-10-CM

## 2016-12-25 LAB — CBC WITH DIFFERENTIAL/PLATELET
BASOS ABS: 0 10*3/uL (ref 0.0–0.1)
Basophils Relative: 0 %
EOS ABS: 0.1 10*3/uL (ref 0.0–0.7)
EOS PCT: 1 %
HCT: 42.5 % (ref 39.0–52.0)
Hemoglobin: 14.2 g/dL (ref 13.0–17.0)
Lymphocytes Relative: 35 %
Lymphs Abs: 2.2 10*3/uL (ref 0.7–4.0)
MCH: 25.9 pg — ABNORMAL LOW (ref 26.0–34.0)
MCHC: 33.4 g/dL (ref 30.0–36.0)
MCV: 77.4 fL — ABNORMAL LOW (ref 78.0–100.0)
Monocytes Absolute: 0.4 10*3/uL (ref 0.1–1.0)
Monocytes Relative: 7 %
Neutro Abs: 3.5 10*3/uL (ref 1.7–7.7)
Neutrophils Relative %: 57 %
PLATELETS: 172 10*3/uL (ref 150–400)
RBC: 5.49 MIL/uL (ref 4.22–5.81)
RDW: 12.5 % (ref 11.5–15.5)
WBC: 6.2 10*3/uL (ref 4.0–10.5)

## 2016-12-25 LAB — COMPREHENSIVE METABOLIC PANEL
ALT: 19 U/L (ref 17–63)
AST: 18 U/L (ref 15–41)
Albumin: 3.6 g/dL (ref 3.5–5.0)
Alkaline Phosphatase: 88 U/L (ref 38–126)
Anion gap: 11 (ref 5–15)
BILIRUBIN TOTAL: 0.3 mg/dL (ref 0.3–1.2)
BUN: 11 mg/dL (ref 6–20)
CO2: 27 mmol/L (ref 22–32)
CREATININE: 1.03 mg/dL (ref 0.61–1.24)
Calcium: 9.3 mg/dL (ref 8.9–10.3)
Chloride: 98 mmol/L — ABNORMAL LOW (ref 101–111)
GFR calc Af Amer: >60 mL/min (ref >60)
GFR calc non Af Amer: >60 mL/min (ref >60)
Glucose, Bld: 390 mg/dL — ABNORMAL HIGH (ref 65–99)
Potassium: 4.4 mmol/L (ref 3.5–5.1)
Sodium: 136 mmol/L (ref 135–145)
TOTAL PROTEIN: 7.9 g/dL (ref 6.5–8.1)

## 2016-12-25 LAB — GLUCOSE, CAPILLARY: Glucose-Capillary: 344 mg/dL — ABNORMAL HIGH (ref 65–99)

## 2016-12-25 MED ORDER — SODIUM CHLORIDE 0.9 % IV BOLUS (SEPSIS)
1000.0000 mL | Freq: Once | INTRAVENOUS | Status: AC
  Administered 2016-12-26: 1000 mL via INTRAVENOUS

## 2016-12-25 MED ORDER — INSULIN ASPART 100 UNIT/ML ~~LOC~~ SOLN
8.0000 [IU] | Freq: Once | SUBCUTANEOUS | Status: AC
  Administered 2016-12-26: 8 [IU] via INTRAVENOUS
  Filled 2016-12-25: qty 1

## 2016-12-25 MED ORDER — VANCOMYCIN HCL IN DEXTROSE 1-5 GM/200ML-% IV SOLN
1000.0000 mg | Freq: Once | INTRAVENOUS | Status: AC
  Administered 2016-12-26: 1000 mg via INTRAVENOUS
  Filled 2016-12-25: qty 200

## 2016-12-25 MED ORDER — PIPERACILLIN-TAZOBACTAM 3.375 G IVPB 30 MIN
3.3750 g | Freq: Once | INTRAVENOUS | Status: AC
  Administered 2016-12-26: 3.375 g via INTRAVENOUS
  Filled 2016-12-25: qty 50

## 2016-12-25 MED ORDER — MORPHINE SULFATE (PF) 4 MG/ML IV SOLN
4.0000 mg | Freq: Once | INTRAVENOUS | Status: AC
  Administered 2016-12-26: 4 mg via INTRAVENOUS
  Filled 2016-12-25: qty 1

## 2016-12-25 NOTE — ED Provider Notes (Signed)
TIME SEEN: 11:47 PM  CHIEF COMPLAINT: Worsening left foot wound  HPI: Patient is a 36 year old male with history of insulin-dependent type 2 diabetes who presents to the emergency department with a left foot wound.  Wound has been there for several weeks but is progressively worsening.  States is draining a large amount of foul-smelling purulent drainage now.  Also reports subjective fevers and vomiting for the past week.  He reports compliance with his insulin.  He is not sure what his blood sugar has been running today however.  He states that he started taking Keflex which was provided to him by urgent care for his foot and feels like he is getting worse instead of better.  His last antipyretic was this morning.  Patient was admitted to the internal medicine service at [facility] on December 05, 2016.  At that time he had an x-ray of his foot which showed no sign of osteomyelitis.  States he did not follow-up with podiatry as he cannot afford to pay them out of pocket.  ROS: See HPI Constitutional: no fever  Eyes: no drainage  ENT: no runny nose   Cardiovascular:  no chest pain  Resp: no SOB  GI: no vomiting GU: no dysuria Integumentary: no rash  Allergy: no hives  Musculoskeletal: no leg swelling  Neurological: no slurred speech ROS otherwise negative  PAST MEDICAL HISTORY/PAST SURGICAL HISTORY:  Past Medical History:  Diagnosis Date  . Diabetes mellitus     MEDICATIONS:  Prior to Admission medications   Medication Sig Start Date End Date Taking? Authorizing Provider  atorvastatin (LIPITOR) 40 MG tablet Take 1 tablet (40 mg total) by mouth daily. 04/12/16   Arnoldo Morale, MD  Blood Glucose Monitoring Suppl (TRUE METRIX METER) DEVI 1 each by Does not apply route 3 (three) times daily before meals. 04/12/16   Arnoldo Morale, MD  furosemide (LASIX) 20 MG tablet Take 1 tablet (20 mg total) by mouth daily. When necessary for feet swelling 12/12/16   Arnoldo Morale, MD  gabapentin  (NEURONTIN) 300 MG capsule Take 2 capsules (600 mg total) by mouth 2 (two) times daily. 12/12/16   Arnoldo Morale, MD  glipiZIDE (GLUCOTROL XL) 10 MG 24 hr tablet Take 1 tablet (10 mg total) by mouth daily with breakfast. 04/12/16   Arnoldo Morale, MD  glucose blood (TRUE METRIX BLOOD GLUCOSE TEST) test strip Used 3 times before meals 04/12/16   Arnoldo Morale, MD  glucose blood test strip Use as instructed 06/12/14   Lance Bosch, NP  glucose monitoring kit (FREESTYLE) monitoring kit 1 each by Does not apply route as needed. 06/12/14   Lance Bosch, NP  ibuprofen (ADVIL,MOTRIN) 800 MG tablet Take 1 tablet (800 mg total) by mouth 3 (three) times daily. 04/01/15   Domenic Moras, PA-C  Insulin Glargine (LANTUS SOLOSTAR) 100 UNIT/ML Solostar Pen Inject 15 Units into the skin daily at 10 pm. 12/06/16   Thomasene Ripple, MD  Lancets (FREESTYLE) lancets Use as instructed 06/12/14   Lance Bosch, NP  metFORMIN (GLUCOPHAGE) 1000 MG tablet Take 1 tablet (1,000 mg total) by mouth 2 (two) times daily. 04/12/16   Arnoldo Morale, MD  mupirocin cream (BACTROBAN) 2 % Apply topically daily. 12/06/16   Thomasene Ripple, MD  TRUEPLUS LANCETS 28G MISC 1 each by Does not apply route 3 (three) times daily before meals. 04/12/16   Arnoldo Morale, MD    ALLERGIES:  No Known Allergies  SOCIAL HISTORY:  Social History  Substance Use  Topics  . Smoking status: Never Smoker  . Smokeless tobacco: Never Used  . Alcohol use 1.2 oz/week    1 Glasses of wine, 1 Cans of beer per week     Comment: occassional    FAMILY HISTORY: Family History  Problem Relation Age of Onset  . Family history unknown: Yes    EXAM: BP 122/82 (BP Location: Left Arm)   Pulse 96   Temp 98.3 F (36.8 C) (Oral)   Resp 18   SpO2 100%  CONSTITUTIONAL: Alert and oriented and responds appropriately to questions. Well-appearing; well-nourished HEAD: Normocephalic EYES: Conjunctivae clear, pupils appear equal, EOMI ENT: normal nose; moist mucous  membranes NECK: Supple, no meningismus, no nuchal rigidity, no LAD  CARD: RRR; S1 and S2 appreciated; no murmurs, no clicks, no rubs, no gallops RESP: Normal chest excursion without splinting or tachypnea; breath sounds clear and equal bilaterally; no wheezes, no rhonchi, no rales, no hypoxia or respiratory distress, speaking full sentences ABD/GI: Normal bowel sounds; non-distended; soft, non-tender, no rebound, no guarding, no peritoneal signs, no hepatosplenomegaly BACK:  The back appears normal and is non-tender to palpation, there is no CVA tenderness EXT: Patient has a large ulcer to the bottom aspect of the left great toe with significant swelling, erythema and warmth that goes up into the midfoot, foul-smelling purulent drainage noted, 2+ DP pulses bilaterally, extremely tender over this area, no joint effusion noted, normal ROM in all joints; otherwise extremities are non-tender to palpation; no edema; normal capillary refill; no cyanosis, no calf tenderness or swelling    SKIN: Normal color for age and race; warm; no rash NEURO: Moves all extremities equally, reports normal sensation diffusely PSYCH: The patient's mood and manner are appropriate. Grooming and personal hygiene are appropriate.  MEDICAL DECISION MAKING: Patient here with worsening left foot wound.  He is hyperglycemic here without DKA.  Reports subjective fevers at home and vomiting.  Has been taking Keflex for 1 week without any relief.  Reports symptoms worsening.  Labs here are reassuring other than hyperglycemia.  X-ray does not show bony erosion.  Will give IV fluids, IV insulin, pain medication.  Will also give vancomycin and Zosyn.  Will send wound culture.  Patient may need admission given he is feeling antibiotics.  ED PROGRESS: Patient's lactate is elevated and not improving with IV fluids.  Will give another liter.  Blood glucose is improving.  Will admit.  2:26 AM Discussed patient's case with internal medicine  resident, Dr. Ronalee Red.  I have recommended admission and patient (and family if present) agree with this plan. Admitting physician will place admission orders.   Patient to be transferred to [facility] to a medical bed.  Patient was just admitted to the internal medicine resident service within the past 30 days.  Accepting physician is Dr. Evette Doffing.  I reviewed all nursing notes, vitals, pertinent previous records, EKGs, lab and urine results, imaging (as available).       Yosselin Zoeller, Delice Bison, DO 12/26/16 [phone]

## 2016-12-25 NOTE — ED Triage Notes (Signed)
Pt is c/o left toe pain, malodorous drainage has been evaluated for DFU including hospital admission. Has not been able to follow up with podiatry due financial problems/lack insurance.

## 2016-12-26 ENCOUNTER — Observation Stay (HOSPITAL_COMMUNITY): Payer: Self-pay

## 2016-12-26 DIAGNOSIS — L0390 Cellulitis, unspecified: Secondary | ICD-10-CM | POA: Diagnosis present

## 2016-12-26 DIAGNOSIS — L97528 Non-pressure chronic ulcer of other part of left foot with other specified severity: Secondary | ICD-10-CM

## 2016-12-26 LAB — GLUCOSE, CAPILLARY
GLUCOSE-CAPILLARY: 235 mg/dL — AB (ref 65–99)
GLUCOSE-CAPILLARY: 247 mg/dL — AB (ref 65–99)
GLUCOSE-CAPILLARY: 349 mg/dL — AB (ref 65–99)
Glucose-Capillary: 272 mg/dL — ABNORMAL HIGH (ref 65–99)
Glucose-Capillary: 274 mg/dL — ABNORMAL HIGH (ref 65–99)
Glucose-Capillary: 325 mg/dL — ABNORMAL HIGH (ref 65–99)

## 2016-12-26 LAB — SEDIMENTATION RATE: SED RATE: 23 mm/h — AB (ref 0–16)

## 2016-12-26 LAB — C-REACTIVE PROTEIN: CRP: 5.8 mg/dL — ABNORMAL HIGH (ref <1.0)

## 2016-12-26 LAB — CG4 I-STAT (LACTIC ACID)
LACTIC ACID, VENOUS: 1.98 mmol/L — AB (ref 0.5–1.9)
Lactic Acid, Venous: 2.06 mmol/L (ref 0.5–1.9)

## 2016-12-26 MED ORDER — VANCOMYCIN HCL IN DEXTROSE 1-5 GM/200ML-% IV SOLN
1000.0000 mg | Freq: Three times a day (TID) | INTRAVENOUS | Status: DC
  Administered 2016-12-26 – 2016-12-29 (×10): 1000 mg via INTRAVENOUS
  Filled 2016-12-26 (×14): qty 200

## 2016-12-26 MED ORDER — ACETAMINOPHEN 325 MG PO TABS
650.0000 mg | ORAL_TABLET | Freq: Four times a day (QID) | ORAL | Status: DC | PRN
  Administered 2016-12-26: 650 mg via ORAL
  Filled 2016-12-26 (×2): qty 2

## 2016-12-26 MED ORDER — INSULIN ASPART 100 UNIT/ML ~~LOC~~ SOLN
0.0000 [IU] | Freq: Three times a day (TID) | SUBCUTANEOUS | Status: DC
  Administered 2016-12-26 (×2): 8 [IU] via SUBCUTANEOUS
  Administered 2016-12-26 – 2016-12-27 (×3): 11 [IU] via SUBCUTANEOUS
  Administered 2016-12-27: 8 [IU] via SUBCUTANEOUS
  Administered 2016-12-28: 11 [IU] via SUBCUTANEOUS
  Administered 2016-12-28: 5 [IU] via SUBCUTANEOUS
  Administered 2016-12-28: 8 [IU] via SUBCUTANEOUS

## 2016-12-26 MED ORDER — ONDANSETRON HCL 4 MG/2ML IJ SOLN
4.0000 mg | Freq: Four times a day (QID) | INTRAMUSCULAR | Status: DC | PRN
  Administered 2016-12-27: 4 mg via INTRAVENOUS
  Filled 2016-12-26: qty 2

## 2016-12-26 MED ORDER — DEXTROSE 5 % IV SOLN
1.0000 g | Freq: Three times a day (TID) | INTRAVENOUS | Status: DC
  Administered 2016-12-26: 1 g via INTRAVENOUS
  Filled 2016-12-26 (×2): qty 1

## 2016-12-26 MED ORDER — GABAPENTIN 300 MG PO CAPS
600.0000 mg | ORAL_CAPSULE | Freq: Two times a day (BID) | ORAL | Status: DC
Start: 2016-12-26 — End: 2016-12-30
  Administered 2016-12-26 – 2016-12-30 (×9): 600 mg via ORAL
  Filled 2016-12-26 (×9): qty 2

## 2016-12-26 MED ORDER — ENOXAPARIN SODIUM 60 MG/0.6ML ~~LOC~~ SOLN
60.0000 mg | Freq: Every day | SUBCUTANEOUS | Status: DC
  Administered 2016-12-27 – 2016-12-30 (×4): 60 mg via SUBCUTANEOUS
  Filled 2016-12-26 (×4): qty 0.6

## 2016-12-26 MED ORDER — ATORVASTATIN CALCIUM 40 MG PO TABS
40.0000 mg | ORAL_TABLET | Freq: Every day | ORAL | Status: DC
  Administered 2016-12-26 – 2016-12-30 (×5): 40 mg via ORAL
  Filled 2016-12-26 (×5): qty 1

## 2016-12-26 MED ORDER — SODIUM CHLORIDE 0.9 % IV BOLUS (SEPSIS)
1000.0000 mL | Freq: Once | INTRAVENOUS | Status: AC
  Administered 2016-12-26: 1000 mL via INTRAVENOUS

## 2016-12-26 MED ORDER — CEFEPIME HCL 2 G IJ SOLR
2.0000 g | Freq: Three times a day (TID) | INTRAMUSCULAR | Status: DC
  Administered 2016-12-26 – 2016-12-28 (×7): 2 g via INTRAVENOUS
  Filled 2016-12-26 (×8): qty 2

## 2016-12-26 MED ORDER — ACETAMINOPHEN 650 MG RE SUPP: 650.0000 mg | Freq: Four times a day (QID) | RECTAL | Status: DC | PRN

## 2016-12-26 MED ORDER — INSULIN ASPART 100 UNIT/ML ~~LOC~~ SOLN
0.0000 [IU] | Freq: Every day | SUBCUTANEOUS | Status: DC
  Administered 2016-12-26 – 2016-12-27 (×2): 4 [IU] via SUBCUTANEOUS
  Administered 2016-12-28: 3 [IU] via SUBCUTANEOUS

## 2016-12-26 MED ORDER — INSULIN GLARGINE 100 UNIT/ML ~~LOC~~ SOLN: 10.0000 [IU] | Freq: Every day | SUBCUTANEOUS | Status: DC

## 2016-12-26 MED ORDER — ENOXAPARIN SODIUM 40 MG/0.4ML ~~LOC~~ SOLN
40.0000 mg | Freq: Every day | SUBCUTANEOUS | Status: DC
  Administered 2016-12-26: 40 mg via SUBCUTANEOUS
  Filled 2016-12-26: qty 0.4

## 2016-12-26 MED ORDER — INSULIN GLARGINE 100 UNIT/ML ~~LOC~~ SOLN
15.0000 [IU] | Freq: Every day | SUBCUTANEOUS | Status: DC
  Administered 2016-12-26: 15 [IU] via SUBCUTANEOUS
  Filled 2016-12-26: qty 0.15

## 2016-12-26 MED ORDER — HYDROMORPHONE HCL 1 MG/ML IJ SOLN
1.0000 mg | Freq: Once | INTRAMUSCULAR | Status: AC
  Administered 2016-12-26: 1 mg via INTRAVENOUS
  Filled 2016-12-26: qty 1

## 2016-12-26 MED ORDER — METRONIDAZOLE IN NACL 5-0.79 MG/ML-% IV SOLN
500.0000 mg | Freq: Three times a day (TID) | INTRAVENOUS | Status: DC
  Administered 2016-12-26 – 2016-12-27 (×4): 500 mg via INTRAVENOUS
  Filled 2016-12-26 (×4): qty 100

## 2016-12-26 MED ORDER — HYDROMORPHONE HCL 1 MG/ML IJ SOLN
0.5000 mg | INTRAMUSCULAR | Status: DC | PRN
  Administered 2016-12-26 – 2016-12-29 (×2): 0.5 mg via INTRAVENOUS
  Filled 2016-12-26 (×2): qty 0.5

## 2016-12-26 NOTE — Plan of Care (Signed)
Problem: Pain Managment: Goal: General experience of comfort will improve Outcome: Progressing Pt reports that pain from toe remains manageable. Offered pain meds but pt refused, stating that pain was at 3/10. Will continue to assess. Pt aware of importance of IV antibiotic treatment for treating toe infection.

## 2016-12-26 NOTE — Progress Notes (Addendum)
Pharmacy Antibiotic Note  Jonathan [name] is a 36 y.o. male admitted on 12/25/2016 with diabetic foot ulcer.  Pharmacy has been consulted for Cefepime and vancomycin dosing.  No risk factors for pseudomonas noted- team wants to keep cefepime for now, ok with adding Flagyl for anaerobic coverage. Patient was on course of Keflex as an outpatient at the end of September. SCr 1.03, nCrCl >15600mL/min.  Received vancomycin 1g in the ED at 0030 this morning. Also got 1 time dose of Zosyn.  MRI this morning with no evidence of osteomyelitis.  Plan: Increase cefepime to 2g IV q8h d/t weight Vancomycin 1g IV q8h. Goal trough 10-3015mcg/mL. Flagyl 500mg  IV q8h Follow c/s, de-escalation, clinical progression, renal function, [facility]  Also increased Lovenox to 0.5mg /kg subQ q24h for VTE prophylaxis as BMI >30 and CrCl >2530mL/min  Weight: 262 lb 6.4 oz (119 kg)  Temp (24hrs), Avg:98.8 F (37.1 C), Min:98.3 F (36.8 C), Max:99.4 F (37.4 C)   Recent Labs Lab 12/25/16 1849 12/26/16 0011 12/26/16 0157  WBC 6.2  --   --   CREATININE 1.03  --   --   LATICACIDVEN  --  1.98* 2.06*    Estimated Creatinine Clearance: 150.4 mL/min (by C-G formula based on SCr of 1.03 mg/dL).    No Known Allergies   10/29 L toe wound: abundant GPC in pairs, abundant GNR 10/29 Bcx: ngtd  Zosyn 10/29 x1 Vancomycin 10/29>> Cefepime 10/29>> Flagyl 10/29>>  Levin Dagostino D. Andrick Rust, PharmD, BCPS Clinical Pharmacist Pager: [phone] Clinical Phone for 12/26/2016 until 3:30pm: [phone] If after 3:30pm, please call main pharmacy at [phone] 12/26/2016 10:07 AM

## 2016-12-26 NOTE — Consult Note (Signed)
WOC Nurse wound consult note Reason for Consult:Nonhealing wound to left great toe with exacerbation x 1 day.  Patient was sent home with mupirocin ointment last hospitalization and states that he has used it twice daily since.  Wound type:neuropathic wound initiated by trauma over the summer.  Pressure Injury POA: NA Measurement: Plantar surface great toe:  0.3 cm puncture wound Dorsal surface:  1 cm x 0.3 cmx 0.1 cm peeling epithelium Medial surface left great toe:  0.5 cm peeling epithelium Wound bed:red and friable Drainage (amount, consistency, odor) minimal serosanguinous Periwound:macerated Dressing procedure/placement/frequency:Cleanse left great toe with NS and pat gently dry.  Apply xeroform gauze to great toe and cover with dry gauze and kerlix/tape. Change daily.   Will not follow at this time.  Please re-consult if needed.  Maple HudsonKaren Denora Wysocki RN BSN CWON Pager [phone]

## 2016-12-26 NOTE — ED Notes (Signed)
[facility] was notified of pt's need of transportation to [facility]. 

## 2016-12-26 NOTE — Progress Notes (Signed)
Pharmacy Antibiotic Note  Jonathan [name] is a 36 y.o. male admitted on 12/25/2016 with diabetic foot ulcer.  Pharmacy has been consulted for Cefepime dosing.  Plan: Cefepime 1 g IV q8h  Weight: 262 lb 6.4 oz (119 kg)  Temp (24hrs), Avg:98.9 F (37.2 C), Min:98.3 F (36.8 C), Max:99.4 F (37.4 C)   Recent Labs Lab 12/25/16 1849 12/26/16 0011 12/26/16 0157  WBC 6.2  --   --   CREATININE 1.03  --   --   LATICACIDVEN  --  1.98* 2.06*    Estimated Creatinine Clearance: 150.4 mL/min (by C-G formula based on SCr of 1.03 mg/dL).    No Known Allergies   Jonathan [name], Jonathan [name] 12/26/2016 7:54 AM

## 2016-12-26 NOTE — Progress Notes (Signed)
New Admission Note:  Arrival Method: By stretcher via [facility] from [facility] around 0400 Mental Orientation: Alert and oriented Telemetry: None Assessment: Completed Skin: Refer to flowsheets IV: Left forearm Pain: Denies Tubes: None Safety Measures: Safety Fall Prevention Plan was given, discussed  Admission: Completed 2 [place] Orientation: Patient has been orientated to the room, unit and the staff. Family: None  Orders have been reviewed and implemented. Will continue to monitor the patient. Call light has been placed within reach.  Alfonse Rashristy Tiera Mensinger, RN  Phone Number: [phone]

## 2016-12-26 NOTE — ED Notes (Signed)
Lactic Acid given to MD and RN. 

## 2016-12-26 NOTE — ED Notes (Signed)
[facility] here to transport pt to [facility]. 

## 2016-12-26 NOTE — Progress Notes (Signed)
Inpatient Diabetes Program Recommendations  AACE/ADA: New Consensus Statement on Inpatient Glycemic Control (2015)  Target Ranges:  Prepandial:   less than 140 mg/dL      Peak postprandial:   less than 180 mg/dL (1-2 hours)      Critically ill patients:  140 - 180 mg/dL   Lab Results  Component Value Date   GLUCAP 349 (H) 12/26/2016   HGBA1C 11.8 (H) 12/06/2016    Review of Glycemic ControlResults for Jonathan MustacheASSUM, Landry (MRN [id number]) as of 12/26/2016 13:07  Ref. Range 12/26/2016 00:01 12/26/2016 01:48 12/26/2016 04:05 12/26/2016 07:57 12/26/2016 12:09  Glucose-Capillary Latest Ref Range: 65 - 99 mg/dL 409344 (H) 811235 (H) 914247 (H) 272 (H) 349 (H)   Diabetes history: Type 2 DM Outpatient Diabetes medications: Glucotrol XL 10 mg with breakfast, Lantus 20 units daily, Metformin 1000 mg bid  Current orders for Inpatient glycemic control:  Lantus 15 units q HS (first dose tonight), Novolog moderate tid with meals and HS  Inpatient Diabetes Program Recommendations:   Please consider starting Lantus now and increase dose to 20 units daily.  Also consider adding Novolog meal coverage 6 units tid with meals while in the hospital.    Thanks, Beryl MeagerJenny Arthea Nobel, RN, BC-ADM Inpatient Diabetes Coordinator Pager [phone](8a-5p)

## 2016-12-26 NOTE — H&P (Signed)
Date: 12/26/2016               Patient Name:  Jonathan [name] MRN: [id number]  DOB: 09/06/1980 Age / Sex: 36 y.o., male   PCP: Arnoldo Morale, MD         Medical Service: Internal Medicine Teaching Service         Attending Physician: Dr. Evette Doffing, Mallie Mussel, *    First Contact: Dr. Berneice Gandy Pager: [phone]  Second Contact: Dr. Heber Dover Pager: [phone]       After Hours (After 5p/  First Contact Pager: [phone]  weekends / holidays): Second Contact Pager: [phone]   Chief Complaint: worsening left foot wound  History of Present Illness:  Jonathan [name] is a 36yo male with PMH significant for DM2 complicated by diabetic neuropathy and non-healing left toe ulcer since July who presents with worsening left foot wound.   He was admitted to [facility] 10/8-9 for diabetic foot ulcer on his left great toe from stepping on a foreign object while in a pool on July 4. ESR and CRP were normal. X-ray of foot without signs of osteomyelitis and no s/s suggestive of cellulitis at the time. He received 1 dose of IV zosyn in the ED, which was discontinued and no outpatient antibiotics were given. He did follow up with Podiatry on 10/15 where he had a dressing change, but was not able to continue follow up with them due to not having insurance. He was seen by Urgent Care on September 26 and took a course of Keflex at that time but has not received other antibiotics since his previous admission.  Since discharge, he had been doing well wearing open-toed shoes. Yesterday, he states he wore closed-toed shoes and after taking them off, he had increased pain and swelling in his foot. He also has noticed foul-smelling, pink-tinged drainage from the ulcer, as well as a lesion on the top of his left great toe. Endorses subjective fever, N/V, and cough for the last few days. Denies chest pain, shortness of breath, dysuria, or changes in his bowel movements.  Takes 20u lantus at home. Checks his BG in the AM (~120-200)  and in the PM (~220-240). States he misses his lantus dose approximately 4 times per week.  ED Course: - He initially presented to [facility] ED. WBC 6.2, CMP unremarkable. Lactic acid 1.98 -> 2.06. Received a dose of vancomycin/zosyn, morphine, as well as 2L NS bolus before being transferred to [facility] for admission to our service. - BP 127/71, HR 86, temp 98.9, RR 18, O2 100% on RA. - Foot x-ray with increased extent of soft tissue abnormality of left great toe without radiographic evidence of osteomyelitis. - Received IV dilaudid 72m here with pain relief.  Meds:  Current Meds  Medication Sig  . atorvastatin (LIPITOR) 40 MG tablet Take 1 tablet (40 mg total) by mouth daily.  . Blood Glucose Monitoring Suppl (TRUE METRIX METER) DEVI 1 each by Does not apply route 3 (three) times daily before meals.  . furosemide (LASIX) 20 MG tablet Take 1 tablet (20 mg total) by mouth daily. When necessary for feet swelling  . gabapentin (NEURONTIN) 300 MG capsule Take 2 capsules (600 mg total) by mouth 2 (two) times daily.  .Marland KitchenglipiZIDE (GLUCOTROL XL) 10 MG 24 hr tablet Take 1 tablet (10 mg total) by mouth daily with breakfast.  . glucose blood (TRUE METRIX BLOOD GLUCOSE TEST) test strip Used 3 times before meals  . glucose blood  test strip Use as instructed  . glucose monitoring kit (FREESTYLE) monitoring kit 1 each by Does not apply route as needed.  [name] ibuprofen (ADVIL,MOTRIN) 800 MG tablet Take 1 tablet (800 mg total) by mouth 3 (three) times daily. (Patient taking differently: Take 800 mg by mouth every 8 (eight) hours as needed for headache, mild pain or moderate pain. )  . Insulin Glargine (LANTUS SOLOSTAR) 100 UNIT/ML Solostar Pen Inject 15 Units into the skin daily at 10 pm. (Patient taking differently: Inject 20 Units into the skin daily at 10 pm. )  . Lancets (FREESTYLE) lancets Use as instructed  . metFORMIN (GLUCOPHAGE) 1000 MG tablet Take 1 tablet (1,000 mg total) by mouth 2 (two) times  daily.  . mupirocin cream (BACTROBAN) 2 % Apply topically daily.  . TRUEPLUS LANCETS 28G MISC 1 each by Does not apply route 3 (three) times daily before meals.   Allergies: Allergies as of 12/25/2016  . (No Known Allergies)   Past Medical History:  Diagnosis Date  . Diabetes mellitus    Family History:  Family History  Problem Relation Age of Onset  . Family history unknown: Yes   Social History:  Social History   Social History  . Marital status: Single    Spouse name: N/A  . Number of children: N/A  . Years of education: N/A   Social History Main Topics  . Smoking status: Never Smoker  . Smokeless tobacco: Never Used  . Alcohol use 1.2 oz/week    1 Glasses of wine, 1 Cans of beer per week     Comment: occassional  . Drug use: No  . Sexual activity: Not Asked   Other Topics Concern  . None   Social History Narrative  . None   Review of Systems: Constitutional: Negative for diaphoresis, malaise/fatigue, and weight loss. Positive for subjective fevers. HEENT: Negative for blurred vision, hearing loss, sinus pain, congestion, and sore throat. Respiratory: Negative for shortness of breath and wheezing. Positive for dry cough. Cardiovascular: Negative for chest pain, palpitations. Positive for left foot swelling. Gastrointestinal: Negative for abdominal pain, blood in stool, constipation, diarrhea. Positive for nausea and vomiting. Genitourinary: Negative for dysuria and hematuria. Musculoskeletal: Negative for myalgias. Positive for pain in his left foot. Neurological: Negative for dizziness, focal weakness, weakness and headaches.  Physical Exam: Blood pressure 127/71, pulse 86, temperature 98.9 F (37.2 C), temperature source Oral, resp. rate 18, weight 262 lb 6.4 oz (119 kg), SpO2 100 %. GEN: Well-appearing, well-nourished overweight pleasant male lying in bed in NAD. Alert and oriented. HENT: [place]/AT. Moist mucous membranes. No visible lesions. EYES: Sclera  non-icteric. Conjunctiva clear. RESP: Clear to auscultation bilaterally. No wheezes, rales, or rhonchi. No increased work of breathing. CV: Normal rate and regular rhythm. No murmurs, gallops, or rubs. ABD: Soft. Non-tender. Non-distended. Normoactive bowel sounds. EXT: 2+ DP bilaterally. 1-2cm ulcer on the plantar aspect of left great toe with warmth, tenderness, and swelling of left foot to the level of mid-shin. Foul-smelling purulent drainage. 2cm superficial lesion on dorsal aspect of left great toe     NEURO: Cranial nerves II-XII grossly intact. Able to lift all four extremities against gravity. No apparent audiovisual hallucinations. Speech fluent and appropriate. PSYCH: Patient is calm and pleasant. Appropriate affect. Well-groomed; speech is appropriate and on-subject.  Labs CBC Latest Ref Rng & Units 12/25/2016 12/06/2016 12/05/2016  WBC 4.0 - 10.5 K/uL 6.2 6.5 4.5  Hemoglobin 13.0 - 17.0 g/dL 14.2 14.3 16.6  Hematocrit 39.0 - 52.0 %  42.5 44.0 50.9  Platelets 150 - 400 K/uL 172 154 157   CMP Latest Ref Rng & Units 12/25/2016 12/06/2016 12/05/2016  Glucose 65 - 99 mg/dL 390(H) 337(H) 310(H)  BUN 6 - 20 mg/dL 11 11 5(L)  Creatinine 0.61 - 1.24 mg/dL 1.03 0.97 0.84  Sodium 135 - 145 mmol/L 136 136 137  Potassium 3.5 - 5.1 mmol/L 4.4 4.1 5.1  Chloride 101 - 111 mmol/L 98(L) 100(L) 101  CO2 22 - 32 mmol/L _0 Calcium 8.9 - 10.3 mg/dL 9.3 9.4 9.8  Total Protein 6.5 - 8.1 g/dL 7.9 - 8.6(H)  Total Bilirubin 0.3 - 1.2 mg/dL 0.3 - 0.6  Alkaline Phos 38 - 126 U/L 88 - 89  AST 15 - 41 U/L 18 - 24  ALT 17 - 63 U/L 19 - 31   Lactic acid 1.98 -> 2.06  Left foot x-ray: Increased extent of soft tissue abnormality of left great toe without radiographic evidence of osteomyelitis  Assessment & Plan by Problem: Active Problems:   Diabetic ulcer of left foot [employer])  Jonathan [name] is a 36yo male with PMH significant for DM2 complicated by diabetic neuropathy and non-healing left toe  ulcer since July who presents with worsening left foot wound, with new s/s concerning for cellulitis/?osteomyelitis.  Non-healing left toe ulcer, concerning for cellulitis Foul-smelling purulent drainage, pain, swelling, and warmth to the area. Has not received antibiotics since his discharge approximately 3 weeks ago. No leukocytosis, afebrile here. Did receive a dose of Vanc/zosyn in the ED. Left foot x-ray shows no evidence of osteomyelitis. - MRI left foot - ESR, CRP - dilaudid 0.4m q4h PRN for pain - BCx (although he has received a dose of antibiotics already)  DM2 Home regimen includes 20u lantus QHS, glipizide 194mdaily, and metformin 2g daily. Most recent BG 247. - Lantus 10u QHS - CBG monitoring - Continue gabapentin  Diet: Diabetic diet VTE PPx: Lovenox Dispo: Admit patient to Inpatient with expected length of stay greater than 2 midnights.  Signed: HuColbert EwingMD 12/26/2016, 4:11 AM

## 2016-12-26 NOTE — Progress Notes (Signed)
   Subjective:  Patient was seen sitting comfortably in bed this AM in no acute distress. He states that he felt well overnight, denying fevers. He continues to endorse pain in his L foot but it is not worse than yesterday.   Objective:  Vital signs in last 24 hours: Vitals:   12/26/16 0100 12/26/16 0200 12/26/16 0407 12/26/16 0700  BP: 123/82 126/80 127/71 (!) 159/59  Pulse: 78 82 86 75  Resp:  18 18 18   Temp:   98.9 F (37.2 C) 98.4 F (36.9 C)  TempSrc:   Oral Oral  SpO2: 100% 98% 100% 100%  Weight:   262 lb 6.4 oz (119 kg)    Physical Exam  Constitutional: He appears well-developed and well-nourished. No distress.  HENT:  Mouth/Throat: Oropharynx is clear and moist.  Cardiovascular: Normal rate, regular rhythm and intact distal pulses.  Exam reveals no friction rub.   No murmur heard. Pulmonary/Chest: Effort normal. No respiratory distress. He has no wheezes.  Abdominal: Soft. He exhibits no distension. There is no tenderness. There is no guarding.  Musculoskeletal: He exhibits edema (L great toe) and tenderness (with palpation of L great toe).  Increased warmth of L foot relative to R foot. Skin sloughing on dorsal aspect of L great toe, stable from photos of previous exams.  Skin: Skin is warm and dry. No rash noted.   Assessment/Plan:  Principal Problem:   Diabetic ulcer of left foot (HCC) Active Problems:   DM type 2, uncontrolled, with neuropathy [facility](HCC)  [name] is a 36yo male with PMH significant for DM2 complicated by diabetic neuropathy who presented with worsening odor and purulent discharge of non-healing ulcer on L great toe which has been present since July 2018. Patient was admitted to the internal medicine teaching service for management. The specific problems addressed during admission are as follows:  Left toe ulcer with overlying cellulitis: Patient's physical exam consisent with L foot cellulitis. Given the patient's pain on physical exam, non healing  nature of cellulitis, and risk factors for polymicrobial infection including DM, plan to continue broad spectrum antibiotics today. MRI left foot and left foot X ray did not show signs of osteomyelitis or any identifiable abscess amenable to drainage. Patient will need to continue antibiotics for treatment of cellulitis.  -Continue vancomycin, cefepime, and metronidazole per pharmacy recommendations. -Continue Dilaudid 0.5mg  q4h PRN for pain -Consult to wound care  T2DM complicated by diabetic neuropathy: Patient takes 20u lantus QHS, glipizide 10mg  daily, and metformin 2g daily for diabetes at home. Holding oral medications while inpatient.  -Increase to Lantus 15u QHS today with SSI -CBG monitoring -Continue home gabapentin 600 mg BID  Diet: Diabetic diet VTE PPx: Lovenox Code status: Full  Dispo: Anticipated discharge in approximately 1-2 day(s).   [name], Yahye Siebert, MD 12/26/2016, 9:45 AM Pager: [phone]

## 2016-12-27 DIAGNOSIS — Z79899 Other long term (current) drug therapy: Secondary | ICD-10-CM

## 2016-12-27 DIAGNOSIS — E1165 Type 2 diabetes mellitus with hyperglycemia: Secondary | ICD-10-CM

## 2016-12-27 LAB — BASIC METABOLIC PANEL
ANION GAP: 7 (ref 5–15)
BUN: 6 mg/dL (ref 6–20)
CO2: 27 mmol/L (ref 22–32)
Calcium: 8.7 mg/dL — ABNORMAL LOW (ref 8.9–10.3)
Chloride: 101 mmol/L (ref 101–111)
Creatinine, Ser: 0.77 mg/dL (ref 0.61–1.24)
GFR calc Af Amer: >60 mL/min (ref >60)
GLUCOSE: 278 mg/dL — AB (ref 65–99)
POTASSIUM: 4 mmol/L (ref 3.5–5.1)
Sodium: 135 mmol/L (ref 135–145)

## 2016-12-27 LAB — GLUCOSE, CAPILLARY
GLUCOSE-CAPILLARY: 317 mg/dL — AB (ref 65–99)
GLUCOSE-CAPILLARY: 266 mg/dL — AB (ref 65–99)
GLUCOSE-CAPILLARY: 207 mg/dL — AB (ref 65–99)
GLUCOSE-CAPILLARY: 318 mg/dL — AB (ref 65–99)

## 2016-12-27 MED ORDER — METRONIDAZOLE 500 MG PO TABS
500.0000 mg | ORAL_TABLET | Freq: Three times a day (TID) | ORAL | Status: DC
  Administered 2016-12-27 – 2016-12-30 (×10): 500 mg via ORAL
  Filled 2016-12-27 (×10): qty 1

## 2016-12-27 MED ORDER — INSULIN ASPART 100 UNIT/ML ~~LOC~~ SOLN
6.0000 [IU] | Freq: Three times a day (TID) | SUBCUTANEOUS | Status: DC
  Administered 2016-12-27 – 2016-12-28 (×3): 6 [IU] via SUBCUTANEOUS

## 2016-12-27 MED ORDER — ONDANSETRON 4 MG PO TBDP: 4.0000 mg | ORAL_TABLET | Freq: Three times a day (TID) | ORAL | Status: DC | PRN

## 2016-12-27 MED ORDER — INSULIN GLARGINE 100 UNIT/ML ~~LOC~~ SOLN
20.0000 [IU] | Freq: Every day | SUBCUTANEOUS | Status: DC
  Administered 2016-12-27: 20 [IU] via SUBCUTANEOUS
  Filled 2016-12-27: qty 0.2

## 2016-12-27 NOTE — Progress Notes (Signed)
   Subjective:  Patient seen laying in bed today in no acute distress. Patient states that he had some episodes of emesis this AM and chills. He states that the pain in his foot is well controlled, however he hasn't noticed any change in the swelling of his L foot compared to R.   Objective:  Vital signs in last 24 hours: Vitals:   12/26/16 0407 12/26/16 0700 12/26/16 1755 12/26/16 2143  BP: 127/71 (!) 159/59 116/73 131/78  Pulse: 86 75 84 77  Resp: 18 18 19 18   Temp: 98.9 F (37.2 C) 98.4 F (36.9 C) 98.6 F (37 C) 98.2 F (36.8 C)  TempSrc: Oral Oral Oral Oral  SpO2: 100% 100% 98% 100%  Weight: 262 lb 6.4 oz (119 kg)   264 lb 15.9 oz (120.2 kg)   Physical Exam  Constitutional: He appears well-developed and well-nourished. No distress.  Cardiovascular: Normal rate, regular rhythm and intact distal pulses.   No murmur heard. Pulmonary/Chest: Effort normal. No respiratory distress. He has no wheezes. He has no rales.  Abdominal: Soft. He exhibits no distension. There is no tenderness. There is no guarding.  Musculoskeletal: He exhibits edema (L foot relative to R). He exhibits no tenderness (with palpation of L great toe).  Skin:  L great toe skin sloughing and ulceration on dorsal aspect with purulent, foul smelling discharge that is unchanged from yesterday's exam. Continuing erythema and swelling of the L great toe compared to R.   Assessment/Plan:  Principal Problem:   Diabetic ulcer of left foot (HCC) Active Problems:   DM type 2, uncontrolled, with neuropathy (HCC)   Cellulitis  Jonathan [name] is a 36yo male with PMH significant for DM2 complicated by diabetic neuropathy who presented with worsening odor and purulent discharge of non-healing ulcer on L great toe which has been present since July 2018. Patient was admitted to the internal medicine teaching service for management. The specific problems addressed during admission are as follows:  Left great toe ulcer with  overlying cellulitis: Patient's imaging not consistent with osteomyelitis. Continuing to treat cellulitis with IV broad spectrum antibiotics, however his exam has not clinically changed after 2 days of therapy. Wound care saw patient yesterday and instructed him on how to properly dress wound daily with dry gauze and kerlix/tape. Will continue antibiotics and wound dressings for now. Will have low threshold to consult orthopedic surgery for evaluation of L great toe wound if he does not clinically improve tomorrow.  -Continue IV vancomycin, cefepime, and oral metronidazole -Continue Dilaudid 0.5mg  q4h PRN for pain -Continue wound care recommendations, assistance from nursing staff appreciated  T2DM complicated by diabetic neuropathy:. Holding home oral medications while inpatient. BG in past 24 hours have been poorly controlled, between 250 and 350. Will increase Lantus and mealtime coverage as below.  -Lantus 20u qNightly  -6 units Novolog for mealtime coverage with SSI -CBG monitoring per unit routine -Continue home gabapentin 600 mg BID  Diet: Diabetic diet VTE PPx: Lovenox Code status: Full  Dispo: Anticipated discharge in approximately 2-3 day(s).   [name], Abree Romick, MD 12/27/2016, 7:00 AM Pager: [phone]

## 2016-12-27 NOTE — Plan of Care (Signed)
Problem: Tissue Perfusion: Goal: Risk factors for ineffective tissue perfusion will decrease Outcome: Progressing Pt aware of importance of antibiotics for treating toe infection.

## 2016-12-28 LAB — BASIC METABOLIC PANEL
Anion gap: 10 (ref 5–15)
BUN: 8 mg/dL (ref 6–20)
CALCIUM: 9 mg/dL (ref 8.9–10.3)
CHLORIDE: 101 mmol/L (ref 101–111)
CO2: 23 mmol/L (ref 22–32)
CREATININE: 0.8 mg/dL (ref 0.61–1.24)
GFR calc non Af Amer: >60 mL/min (ref >60)
GLUCOSE: 332 mg/dL — AB (ref 65–99)
Potassium: 4 mmol/L (ref 3.5–5.1)
Sodium: 134 mmol/L — ABNORMAL LOW (ref 135–145)

## 2016-12-28 LAB — AEROBIC CULTURE  (SUPERFICIAL SPECIMEN)

## 2016-12-28 LAB — GLUCOSE, CAPILLARY
GLUCOSE-CAPILLARY: 296 mg/dL — AB (ref 65–99)
Glucose-Capillary: 307 mg/dL — ABNORMAL HIGH (ref 65–99)
Glucose-Capillary: 207 mg/dL — ABNORMAL HIGH (ref 65–99)
Glucose-Capillary: 270 mg/dL — ABNORMAL HIGH (ref 65–99)

## 2016-12-28 LAB — AEROBIC CULTURE W GRAM STAIN (SUPERFICIAL SPECIMEN)

## 2016-12-28 MED ORDER — INSULIN GLARGINE 100 UNIT/ML ~~LOC~~ SOLN: 25.0000 [IU] | Freq: Every day | SUBCUTANEOUS | Status: DC

## 2016-12-28 MED ORDER — INSULIN GLARGINE 100 UNIT/ML ~~LOC~~ SOLN
30.0000 [IU] | Freq: Every day | SUBCUTANEOUS | Status: DC
  Administered 2016-12-28: 30 [IU] via SUBCUTANEOUS
  Filled 2016-12-28: qty 0.3

## 2016-12-28 MED ORDER — INSULIN ASPART 100 UNIT/ML ~~LOC~~ SOLN
10.0000 [IU] | Freq: Three times a day (TID) | SUBCUTANEOUS | Status: DC
  Administered 2016-12-28 – 2016-12-30 (×8): 10 [IU] via SUBCUTANEOUS

## 2016-12-28 MED ORDER — DEXTROSE 5 % IV SOLN: 2.0000 g | Freq: Three times a day (TID) | INTRAVENOUS | Status: DC

## 2016-12-28 MED ORDER — SODIUM CHLORIDE 0.9 % IV SOLN
Freq: Three times a day (TID) | INTRAVENOUS | Status: DC
  Administered 2016-12-28 – 2016-12-30 (×5): via INTRAVENOUS
  Filled 2016-12-28 (×5): qty 50

## 2016-12-28 NOTE — Progress Notes (Signed)
Inpatient Diabetes Program Recommendations  AACE/ADA: New Consensus Statement on Inpatient Glycemic Control (2015)  Target Ranges:  Prepandial:   less than 140 mg/dL      Peak postprandial:   less than 180 mg/dL (1-2 hours)      Critically ill patients:  140 - 180 mg/dL   Lab Results  Component Value Date   GLUCAP 307 (H) 12/28/2016   HGBA1C 11.8 (H) 12/06/2016    Review of Glycemic ControlResults for Jonathan MustacheASSUM, Jonathan [name] (MRN [id number]) as of 12/28/2016 11:46  Ref. Range 12/27/2016 07:56 12/27/2016 12:15 12/27/2016 17:04 12/27/2016 21:10 12/28/2016 07:59  Glucose-Capillary Latest Ref Range: 65 - 99 mg/dL 782317 (H) 956266 (H) 213207 (H) 318 (H) 307 (H)   Diabetes history: Type 2 DM Outpatient Diabetes medications: Glucotrol XL 10 mg with breakfast, Lantus 20 units daily, Metformin 1000 mg bid  Current orders for Inpatient glycemic control:  Lantus 25 units q HS, Novolog moderate tid with meals and HS, Novolog 10 units tid with meals Inpatient Diabetes Program Recommendations:  Please consider further increase of Lantus to 35 units q HS.   Thanks, Beryl MeagerJenny Halo Shevlin, RN, BC-ADM Inpatient Diabetes Coordinator Pager [phone] (8a-5p)

## 2016-12-28 NOTE — Progress Notes (Signed)
CM consult for insurance. Financial Counselors are actively following for Medicaid potential. Consult complete.

## 2016-12-28 NOTE — Discharge Summary (Signed)
Name: Jonathan [name] MRN: [id number] DOB: 08-25-1980 36 y.o. PCP: Arnoldo Morale, MD  Date of Admission: 12/25/2016 10:57 PM Date of Discharge: 12/30/2016 Attending Physician: Larey Dresser A  Discharge Diagnosis: 1.  Diabetic foot ulcer with overlying cellulitis   Discharge Medications: Allergies as of 12/30/2016   No Known Allergies     Medication List    TAKE these medications   atorvastatin 40 MG tablet Commonly known as:  LIPITOR Take 1 tablet (40 mg total) by mouth daily.   doxycycline 100 MG tablet Commonly known as:  VIBRA-TABS Take 1 tablet (100 mg total) by mouth every 12 (twelve) hours.   freestyle lancets Use as instructed   TRUEPLUS LANCETS 28G Misc 1 each by Does not apply route 3 (three) times daily before meals.   furosemide 20 MG tablet Commonly known as:  LASIX Take 1 tablet (20 mg total) by mouth daily. When necessary for feet swelling   gabapentin 300 MG capsule Commonly known as:  NEURONTIN Take 2 capsules (600 mg total) by mouth 2 (two) times daily.   glipiZIDE 10 MG 24 hr tablet Commonly known as:  GLUCOTROL XL Take 1 tablet (10 mg total) by mouth daily with breakfast.   glucose blood test strip Use as instructed   glucose blood test strip Commonly known as:  TRUE METRIX BLOOD GLUCOSE TEST Used 3 times before meals   glucose monitoring kit monitoring kit 1 each by Does not apply route as needed.   TRUE METRIX METER Devi 1 each by Does not apply route 3 (three) times daily before meals.   ibuprofen 800 MG tablet Commonly known as:  ADVIL,MOTRIN Take 1 tablet (800 mg total) by mouth 3 (three) times daily. What changed:  when to take this  reasons to take this   Insulin Glargine 100 UNIT/ML Solostar Pen Commonly known as:  LANTUS SOLOSTAR Inject 15 Units into the skin daily at 10 pm. What changed:  how much to take   metFORMIN 1000 MG tablet Commonly known as:  GLUCOPHAGE Take 1 tablet (1,000 mg total) by mouth 2  (two) times daily.   metroNIDAZOLE 500 MG tablet Commonly known as:  FLAGYL Take 1 tablet (500 mg total) by mouth every 8 (eight) hours.   mupirocin cream 2 % Commonly known as:  BACTROBAN Apply topically daily.       Disposition and follow-up:   [name] was discharged from [facility] in Good condition.  At the hospital follow up visit please address:  1.  Please assess wound for further erythema, necrosis, purulent discharge.  2. Please assess compliance with antibiotic therapy.  He was discharged on doxycycline and Flagyl for 5 days.  3.  Orange card information provided on day of discharge.  Please discuss this with patient as he is unable to follow-up with podiatry due to being uninsured.  He needs referral to podiatry as soon as possible for further wound debridement.  4.  Labs / imaging needed at time of follow-up: None  5.  Pending labs/ test needing follow-up: None  Follow-up Appointments: Follow-up Information    Arnoldo Morale, MD. Schedule an appointment as soon as possible for a visit in 1 week(s).   Specialty:  Family Medicine Why:  Call to make hopsital follow up appt  Contact information: [place] [address] [phone]        [place]. Go on 01/06/2017.   Why:  You have a follow up  appt in 11/9 at 10:45am  Contact information: [address] [place] Hospital Course by problem list:   [name] is a 36yo male with PMH significant for insulin dependent L5BW complicated by diabetic neuropathy who presented with worsening odor and purulent discharge of non-healing ulcer on L great toe. The ulcer had been present since July 2018, but developed purulent discharge in the 2 days leading up to [place]. Patient was admitted to the internal medicine teaching service with podiatry consulting for management. The specific problems  addressed during admission are as follows:  1. Left great toe ulcer with overlyingcellulitis:The patient initially presented to the ED with concerns for cellulitis of L foot and underlying osteomyelitis. His imaging, including foot X ray and MRI, were not consistent with osteomyelitis.  He received IV vancomycin, cefepime, and p.o. Flagyl for 5 days with no improvement in wound.  Podiatry consulted who performed debridement of wound and recommended a 1 week follow-up for further debridement and PO antibiotics.  However patient unable to follow-up with [facility] as he is uninsured at this time.  Patient was provided information about the orange card.  He has a follow-up appointment at the [facility] on 11/9 at which time will help patient with orange card process and setting up follow up with podiatry or wound care.  He was discharged on doxycycline 100 mg twice daily and Flagyl 500 mg 3 times daily.  Home health RN arranged to help with wound care at home.  I2MB complicated by diabetic neuropathy: Per chart review the patient's Hgb A1C on 12/06/2016 was 11.8. The patient's home medications of metformin and glipizide where held on admission and his diabetes was controlled with mealtime insulin (10 units with SSI-resistant) and Lantus 35U QHS while inpatient. The patient's home medication of gabapentin 600 mg BID was continued for control of diabetic neuropathy.  His home medications were resumed on day of discharge.  Discharge Vitals:   BP 134/77 (BP Location: Left Arm)   Pulse 99   Temp 98 F (36.7 C) (Oral)   Resp 18   Ht '6\' 8"'  (2.032 m)   Wt 263 lb 6.4 oz (119.5 kg)   SpO2 100%   BMI 28.94 kg/m   Pertinent Labs, Studies, and Procedures:   BMP BMP Latest Ref Rng & Units 12/30/2016 12/29/2016 12/28/2016  Glucose 65 - 99 mg/dL 208(H) 364(H) 332(H)  BUN 6 - 20 mg/dL '6 6 8  ' Creatinine 0.61 - 1.24 mg/dL 0.87 0.85 0.80  Sodium 135 - 145 mmol/L 136 132(L) 134(L)  Potassium 3.5 - 5.1 mmol/L  3.9 4.0 4.0  Chloride 101 - 111 mmol/L 101 98(L) 101  CO2 22 - 32 mmol/L '29 26 23  ' Calcium 8.9 - 10.3 mg/dL 8.9 9.0 9.0   CBC CBC Latest Ref Rng & Units 12/29/2016 12/25/2016 12/06/2016  WBC 4.0 - 10.5 K/uL 4.7 6.2 6.5  Hemoglobin 13.0 - 17.0 g/dL 12.9(L) 14.2 14.3  Hematocrit 39.0 - 52.0 % 40.2 42.5 44.0  Platelets 150 - 400 K/uL 186 172 154   ESR = 23 CRP = 5.8  Left Foot Xray: IMPRESSION: Increased extent of soft tissue abnormality of the left great toe without radiographic evidence of osteomyelitis.  MRI Left foot w/o contrast: IMPRESSION: 1. Suboptimal study due to artifact. 2. No findings suggestive of osteomyelitis. 3. No soft tissue abscesses. 4. Nonspecific subcutaneous edema on the dorsum of the foot.  Discharge  Instructions: Discharge Instructions    Call MD for:  redness, tenderness, or signs of infection (pain, swelling, redness, odor or green/yellow discharge around incision site)    Complete by:  As directed    Call MD for:  severe uncontrolled pain    Complete by:  As directed    Call MD for:  temperature >100.4    Complete by:  As directed    Diet Carb Modified    Complete by:  As directed    Increase activity slowly    Complete by:  As directed       Signed: Welford Roche, MD  Internal Medicine PGY-1  P [phone]

## 2016-12-28 NOTE — Progress Notes (Signed)
   Subjective:  Patient was seen laying comfortably in his bed this AM in no acute distress. He states that the nausea he experienced yesterday is improved and he was able to tolerate his meals yesterday without emesis. He states that the pain in his foot is also improved from yesterday. He has been getting assistance with dressing changes from his nurses and these have been well tolerated. He denies fevers and chills.  Objective:  Vital signs in last 24 hours: Vitals:   12/27/16 2113 12/28/16 0235 12/28/16 0435 12/28/16 0800  BP: 129/62  120/62 108/64  Pulse: 82  66 65  Resp: 18  18 18   Temp: 98.4 F (36.9 C)  98.2 F (36.8 C) 97.9 F (36.6 C)  TempSrc: Oral  Oral Oral  SpO2: 98%  99% 99%  Weight: 264 lb 6.4 oz (119.9 kg)     Height:  6\' 8"  (2.032 m)      Physical Exam  Constitutional: He appears well-developed and well-nourished. No distress.  HENT:  Mouth/Throat: Oropharynx is clear and moist.  Cardiovascular: Normal rate and regular rhythm.  Exam reveals no friction rub.   No murmur heard. Pulmonary/Chest: Effort normal. No respiratory distress. He has no wheezes. He has no rales.  Abdominal: Soft. Bowel sounds are normal. He exhibits no distension. There is no tenderness. There is no guarding.  Musculoskeletal: He exhibits edema (L foot relative to R, mild improvement from yesterday's exam). He exhibits no tenderness.  L great toe ulcer on plantar aspect of great foot and skin sloughing on dorsal aspect of great toe with purulent, malodorous discharge unchanged from previous exams. Erythema, tenderness, and edema of L great toe slightly improved from yesterday's exam.  Skin: Skin is warm and dry. He is not diaphoretic. No erythema. No pallor.   Assessment/Plan:  Principal Problem:   Diabetic ulcer of left foot (HCC) Active Problems:   DM type 2, uncontrolled, with neuropathy (HCC)   Cellulitis  Jonathan [name] is a 36yo male with PMH significant for DM2 complicated by  diabetic neuropathy who presented with worsening odor and purulent discharge of non-healing ulcer on L great toe which has been present since July 2018. Patient was admitted to the internal medicine teaching service for management. The specific problems addressed during admission are as follows:  Left great toe ulcer with overlyingcellulitis:Patient's physical exam not much improved from yesterday even though patient has received spite of antibiotic therapy for >48 hours. Patient previously seen in outpatient [facility] by [facility] for management of ulcer prior to development of overlying cellulitis. Plan to consult them today given little clinical improvement with IV antibiotics for their recommendations regarding possible underlying osteomyelitis/amputation.  -[facility] consulted, appreciate recommendations -Continue IV vancomycin, cefepime, and oral metronidazole -Continue Dilaudid 0.5mg  q4h PRN for pain -Continue wound care recommendations, assistance from nursing staff appreciated  T2DM complicated by diabetic neuropathy: BG >300 on multiple readings yesterday.  Will increase Lantus and mealtime coverage as below: -Increase nightly Lantus to 30 units, consider increasing to 35 units tomorrow if needed -Increase mealtime Novolog to 10 units with moderate SSI, consider increasing to resistant SSI tomorrow -CBG monitoring per unit routine -Continue home gabapentin 600 mg BID  Diet: Diabetic diet VTE PPx: Lovenox Code status:Full  Dispo: Anticipated discharge in approximately 2-3 day(s).   Jonathan [name], Jonathan Darrah, MD 12/28/2016, 8:35 AM Pager: [phone]

## 2016-12-29 ENCOUNTER — Telehealth: Payer: Self-pay | Admitting: *Deleted

## 2016-12-29 DIAGNOSIS — E11622 Type 2 diabetes mellitus with other skin ulcer: Secondary | ICD-10-CM

## 2016-12-29 DIAGNOSIS — L84 Corns and callosities: Secondary | ICD-10-CM

## 2016-12-29 LAB — GLUCOSE, CAPILLARY
Glucose-Capillary: 269 mg/dL — ABNORMAL HIGH (ref 65–99)
Glucose-Capillary: 220 mg/dL — ABNORMAL HIGH (ref 65–99)
Glucose-Capillary: 199 mg/dL — ABNORMAL HIGH (ref 65–99)

## 2016-12-29 LAB — BASIC METABOLIC PANEL
ANION GAP: 8 (ref 5–15)
BUN: 6 mg/dL (ref 6–20)
CHLORIDE: 98 mmol/L — AB (ref 101–111)
CO2: 26 mmol/L (ref 22–32)
Calcium: 9 mg/dL (ref 8.9–10.3)
Creatinine, Ser: 0.85 mg/dL (ref 0.61–1.24)
GFR calc non Af Amer: >60 mL/min (ref >60)
Glucose, Bld: 364 mg/dL — ABNORMAL HIGH (ref 65–99)
POTASSIUM: 4 mmol/L (ref 3.5–5.1)
Sodium: 132 mmol/L — ABNORMAL LOW (ref 135–145)

## 2016-12-29 LAB — CBC
HEMATOCRIT: 40.2 % (ref 39.0–52.0)
HEMOGLOBIN: 12.9 g/dL — AB (ref 13.0–17.0)
MCH: 25.2 pg — ABNORMAL LOW (ref 26.0–34.0)
MCHC: 32.1 g/dL (ref 30.0–36.0)
MCV: 78.5 fL (ref 78.0–100.0)
Platelets: 186 10*3/uL (ref 150–400)
RBC: 5.12 MIL/uL (ref 4.22–5.81)
RDW: 12.7 % (ref 11.5–15.5)
WBC: 4.7 10*3/uL (ref 4.0–10.5)

## 2016-12-29 LAB — VANCOMYCIN, TROUGH: Vancomycin Tr: 8 ug/mL — ABNORMAL LOW (ref 15–20)

## 2016-12-29 MED ORDER — INSULIN ASPART 100 UNIT/ML ~~LOC~~ SOLN
0.0000 [IU] | Freq: Every day | SUBCUTANEOUS | Status: DC
  Administered 2016-12-29: 2 [IU] via SUBCUTANEOUS

## 2016-12-29 MED ORDER — INSULIN ASPART 100 UNIT/ML ~~LOC~~ SOLN
0.0000 [IU] | Freq: Three times a day (TID) | SUBCUTANEOUS | Status: DC
  Administered 2016-12-29: 11 [IU] via SUBCUTANEOUS
  Administered 2016-12-29: 4 [IU] via SUBCUTANEOUS
  Administered 2016-12-29 – 2016-12-30 (×4): 7 [IU] via SUBCUTANEOUS

## 2016-12-29 MED ORDER — INSULIN GLARGINE 100 UNIT/ML ~~LOC~~ SOLN
35.0000 [IU] | Freq: Every day | SUBCUTANEOUS | Status: DC
  Administered 2016-12-29: 35 [IU] via SUBCUTANEOUS
  Filled 2016-12-29 (×2): qty 0.35

## 2016-12-29 MED ORDER — VANCOMYCIN HCL 10 G IV SOLR
1500.0000 mg | Freq: Three times a day (TID) | INTRAVENOUS | Status: DC
  Administered 2016-12-29 – 2016-12-30 (×2): 1500 mg via INTRAVENOUS
  Filled 2016-12-29 (×2): qty 1500

## 2016-12-29 NOTE — Consult Note (Addendum)
Subjective:  Patient ID: Jonathan [name], male    DOB: Nov 25, 1980,  MRN: [id number]  Reason for Consult: L foot ulcer Consulting Physician: Dr. Frederico Hamman  36 y.o. male seen at bedside. Reports wound on his left great toe since July. States it originally started by stepping on something. States that the wound has worsened most in the past month. Reports cellulitis prior to presentation.   Past Medical History:  Diagnosis Date  . Diabetes mellitus    History reviewed. No pertinent surgical history.  Current Facility-Administered Medications:  .  acetaminophen (TYLENOL) tablet 650 mg, 650 mg, Oral, Q6H PRN, 650 mg at 12/26/16 2207 **OR** acetaminophen (TYLENOL) suppository 650 mg, 650 mg, Rectal, Q6H PRN, Molt, Bethany, DO .  atorvastatin (LIPITOR) tablet 40 mg, 40 mg, Oral, Daily, Molt, Bethany, DO, 40 mg at 12/29/16 0950 .  enoxaparin (LOVENOX) injection 60 mg, 60 mg, Subcutaneous, Daily, Bajbus, Lauren D, RPH, 60 mg at 12/29/16 0950 .  gabapentin (NEURONTIN) capsule 600 mg, 600 mg, Oral, BID, Molt, Bethany, DO, 600 mg at 12/29/16 0950 .  HYDROmorphone (DILAUDID) injection 0.5 mg, 0.5 mg, Intravenous, Q4H PRN, Molt, Bethany, DO, 0.5 mg at 12/29/16 1113 .  insulin aspart (novoLOG) injection 0-20 Units, 0-20 Units, Subcutaneous, TID WC, Velna Ochs, MD, 4 Units at 12/29/16 1748 .  insulin aspart (novoLOG) injection 0-5 Units, 0-5 Units, Subcutaneous, QHS, Guilloud, Carolyn, MD .  insulin aspart (novoLOG) injection 10 Units, 10 Units, Subcutaneous, TID WC, Velna Ochs, MD, 10 Units at 12/29/16 1748 .  insulin glargine (LANTUS) injection 35 Units, 35 Units, Subcutaneous, QHS, Guilloud, Carolyn, MD .  metroNIDAZOLE (FLAGYL) tablet 500 mg, 500 mg, Oral, Q8H, Nedrud, Marybeth, MD, 500 mg at 12/29/16 1326 .  ondansetron (ZOFRAN-ODT) disintegrating tablet 4 mg, 4 mg, Oral, Q8H PRN, Nedrud, Marybeth, MD .  sodium chloride 0.9 % 50 mL with ceFEPIme (MAXIPIME) 2 g infusion, , Intravenous,  Q8H, Axel Filler, MD, Last Rate: 100 mL/hr at 12/29/16 1234 .  vancomycin (VANCOCIN) IVPB 1000 mg/200 mL premix, 1,000 mg, Intravenous, Q8H, Bajbus, Lauren D, RPH, Stopped at 12/29/16 1209  No Known Allergies Review of Systems negative except as noted in the HPI. Objective:   Vitals:   12/29/16 0759 12/29/16 1732  BP: 118/65 131/77  Pulse: 74 75  Resp: 18 18  Temp: 98.6 F (37 C) 98.1 F (36.7 C)  SpO2: 99% 99%   Results for orders placed or performed during the hospital encounter of 12/25/16 (from the past 24 hour(s))  Glucose, capillary     Status: Abnormal   Collection Time: 12/28/16  9:01 PM  Result Value Ref Range   Glucose-Capillary 270 (H) 65 - 99 mg/dL  Basic metabolic panel     Status: Abnormal   Collection Time: 12/29/16  4:11 AM  Result Value Ref Range   Sodium 132 (L) 135 - 145 mmol/L   Potassium 4.0 3.5 - 5.1 mmol/L   Chloride 98 (L) 101 - 111 mmol/L   CO2 26 22 - 32 mmol/L   Glucose, Bld 364 (H) 65 - 99 mg/dL   BUN 6 6 - 20 mg/dL   Creatinine, Ser 0.85 0.61 - 1.24 mg/dL   Calcium 9.0 8.9 - 10.3 mg/dL   GFR calc non Af Amer >60 >60 mL/min   GFR calc Af Amer >60 >60 mL/min   Anion gap 8 5 - 15  Glucose, capillary     Status: Abnormal   Collection Time: 12/29/16  7:56 AM  Result Value Ref Range  Glucose-Capillary 269 (H) 65 - 99 mg/dL  Glucose, capillary     Status: Abnormal   Collection Time: 12/29/16 11:55 AM  Result Value Ref Range   Glucose-Capillary 220 (H) 65 - 99 mg/dL  Glucose, capillary     Status: Abnormal   Collection Time: 12/29/16  5:30 PM  Result Value Ref Range   Glucose-Capillary 199 (H) 65 - 99 mg/dL    General AA&O x3. Normal mood and affect.  Vascular Dorsalis pedis and posterior tibial pulses  present 1+ bilaterally  Capillary refill slightly delayed to the L hallux. Pedal hair growth absent.  Neurologic Epicritic sensation grossly absent.  Dermatologic L great toe with plantar ulcer probing almost to bone.  Circumferential blistering of the digit noted. Upon removal toe with mixed areas of fibrotic and necrotic tissue. No active purulence. No active erythema or cellulitis.  Orthopedic: + motor to toes. No pain to palpation L great toe.   Assessment & Plan:  Patient was evaluated and treated and all questions answered.  L Great Toe Ulceration with SSTI; Diabetic Foot Infection IDSA Severity: Mild. -XR and MRI reviewed - no signs of OM. -Wound culture polymicrobial likely from skin flora. -R Hallux debrided at bedside as below. -Will trial outpatient management of this wound. -Recommend PO abx at discharge. Recommend Clinda/Cipro -WBC normal. ESR/CRP levels not indicative of bone infection.  Procedure: Excisional Debridement of Wound Rationale: Removal of non-viable soft tissue from the wound to promote healing.  Anesthesia: none  Area Debrided: approximately 5 cm x 4 cm  Type of Debridement: Excisional Tissue Removed: Non-viable soft tissue Depth of Debridement: skin, subcutaneous tissue Instrumentation: pickup and scissors. Technique: Sharp excisional debridement to bleeding, viable wound base.  Dressing: Dry, sterile, compression dressing. Disposition: Patient tolerated procedure well. Patient to return in 1 week for follow-up.  Thank you for the consult. Please have patient follow up within 1 week of discharge. [phone]

## 2016-12-29 NOTE — Telephone Encounter (Addendum)
Dr. Evelene CroonSantos states [facility] to out doctor-on-call, but no one from our office has seen the pt. Dr. Evelene CroonSantos states pt has been on IV antibiotics for 3 days without improvement and they are concerned with wet gangrene. I reviewed Appts, no Referral had been made to our office from [facility]. Dr. Antony ContrasGuilloud answered, and I informed her their was no Referral to our office at this time. I spoke with Dr. Samuella CotaPrice and he states he will see pt once the referral is in. Dr. Antony ContrasGuilloud states they will put in an order for the referral, and I told her I would have one of our doctors see the pt today. Dr. Evelene CroonSantos states the consultation was put in. I reviewed Other Orders and printed consultation and gave to Dr. Samuella CotaPrice.

## 2016-12-29 NOTE — Progress Notes (Signed)
Pharmacy Antibiotic Note  Lolly Mustachebrahim Yust is a 36 y.o. male admitted on 12/25/2016 with diabetic foot ulcer.  Pharmacy managing Cefepime and vancomycin   An 8 hour vancomycin trough today was sub-therapeutic at 8 today. WBC 4.7. Afebrile   Plan: Increase Vancomycin to 1500 mg IV q8h. Goal trough 10-3315mcg/mL. Continue cefepime 2 gm IV Q 8 hours  Flagyl 500mg  IV q8h Repeat vanc trough as necessary   Height: 6\' 8"  (203.2 cm) Weight: 262 lb 11.2 oz (119.2 kg) IBW/kg (Calculated) : 96  Temp (24hrs), Avg:98.2 F (36.8 C), Min:97.7 F (36.5 C), Max:98.6 F (37 C)   Recent Labs Lab 12/25/16 1849 12/26/16 0011 12/26/16 0157 12/27/16 0211 12/28/16 0331 12/29/16 0411 12/29/16 1918  WBC 6.2  --   --   --   --   --  4.7  CREATININE 1.03  --   --  0.77 0.80 0.85  --   LATICACIDVEN  --  1.98* 2.06*  --   --   --   --   VANCOTROUGH  --   --   --   --   --   --  8*    Estimated Creatinine Clearance: 182.4 mL/min (by C-G formula based on SCr of 0.85 mg/dL).    No Known Allergies   10/29 L toe wound: abundant GPC in pairs, abundant GNR -> multiple organisms, none predominant (final) 10/29 BCx x 2: ng x 2 day to date  Zosyn 10/29 x1 Vancomycin 10/29>> Cefepime 10/29>> Flagyl 10/29 >>   Vinnie LevelBenjamin Lakeyshia Tuckerman, PharmD., BCPS Clinical Pharmacist Pager [phone]

## 2016-12-29 NOTE — Progress Notes (Signed)
   Subjective:  No acute events overnight. Patient states he is doing well this morning. Denies foot pain. States L foot swelling not improving. Able to ambulate. Denies fever, chills, and N/V.   Objective:  Vital signs in last 24 hours: Vitals:   12/28/16 1745 12/28/16 2102 12/29/16 0422 12/29/16 0759  BP: 131/90 129/78 111/66 118/65  Pulse: 76 77 81 74  Resp: 18 18 18 18   Temp: 98 F (36.7 C) 98.4 F (36.9 C) 97.7 F (36.5 C) 98.6 F (37 C)  TempSrc: Oral Oral Oral Oral  SpO2: 98% 100% 98% 99%  Weight:  262 lb 11.2 oz (119.2 kg)    Height:       General: pleasant male, well-nourished, well-developed, lying in bed in no acute distress  Cardiac: regular rate and rhythm, nl S1/S2, no murmurs, rubs or gallops  Pulm: CTAB, no wheezes or crackles, no increased work of breathing  Neuro: A&Ox3, able to move all 4 extremities, no focal deficits noted  Ext: warm and well perfused, no peripheral edema, 2+ DP pulses bilaterally  Derm: There is a L great toe ulcer on plantar surface and skin sloughing on dorsal surface associated with erythema. No discharge noted. There is foul-smelling odor present.  There is 2+ pitting edema of left foot.  No tenderness on exam today.   Assessment/Plan:  [name] is a 36yo male with PMH significant for insulin dependent T2DM complicated by diabetic neuropathy who presented with worsening odor and purulent discharge of non-healing ulcer on L great toe which has been present since July 2018.   Left great toe ulcer with overlyingcellulitis:Patient is on Day 4 of IV broad spectrum antibiotics with minimal improvement on physical exam. Remains neurovascularly intact and reports pain is well controlled.  Concern for wet gangrene. Podiatry reconsulted today for recommendations regarding continuing IV antibiotic therapy versus amputation.  -[facility] re-consulted, appreciate recommendations -Continue IV vancomycin, cefepime, and oral metronidazole  - Day 4  -Continue Dilaudid 0.5mg  q4h PRN for pain -Continue wound care recommendations, assistance from nursing staff appreciated  T2DM complicated by diabetic neuropathy: BG >364 this AM after increase in Lantus 25-->30 yesterday. Will continue to readjust patient's insulin regimen given persistent hyperglycemia.  -Increase Lantus to 35U -Continue mealtime Novolog to 10 units  -Increasing moderate SSI--> resistant SSI  -CBG monitoring per unit routine -Continue home gabapentin 600 mg BID  Diet: Diabetic diet VTE PPx: Lovenox Code status:Full  Dispo: Anticipated discharge in approximately 2-3 day(s) pending evaluation/recommendations from podiatry.   Burna CashIdalys Santos-Sanchez, MD  Internal Medicine PGY-1  P [phone]

## 2016-12-29 NOTE — Progress Notes (Signed)
Received phone call from MD that patient will be discharging this evening. Pt is active with [facility] and uses their pharmacy but will be d/cing too late to get his medications filled today. CM provide him with a MATCH letter and [facility] Pt voiced understanding. Pt d/cing home with a foot wound. [facility] for [facility] changes and to educate the patient on dressing changes. CM called [name] with [facility] and they are going to see if patient qualifies for charity services.  [facility]. Bedside RN to have security take the patient to his car when ready for d/c.

## 2016-12-30 DIAGNOSIS — Z794 Long term (current) use of insulin: Secondary | ICD-10-CM

## 2016-12-30 DIAGNOSIS — E1140 Type 2 diabetes mellitus with diabetic neuropathy, unspecified: Secondary | ICD-10-CM

## 2016-12-30 DIAGNOSIS — E11621 Type 2 diabetes mellitus with foot ulcer: Principal | ICD-10-CM

## 2016-12-30 DIAGNOSIS — L97529 Non-pressure chronic ulcer of other part of left foot with unspecified severity: Secondary | ICD-10-CM

## 2016-12-30 DIAGNOSIS — L03032 Cellulitis of left toe: Secondary | ICD-10-CM

## 2016-12-30 DIAGNOSIS — E11628 Type 2 diabetes mellitus with other skin complications: Secondary | ICD-10-CM

## 2016-12-30 LAB — BASIC METABOLIC PANEL
ANION GAP: 6 (ref 5–15)
BUN: 6 mg/dL (ref 6–20)
CALCIUM: 8.9 mg/dL (ref 8.9–10.3)
CO2: 29 mmol/L (ref 22–32)
CREATININE: 0.87 mg/dL (ref 0.61–1.24)
Chloride: 101 mmol/L (ref 101–111)
GLUCOSE: 208 mg/dL — AB (ref 65–99)
Potassium: 3.9 mmol/L (ref 3.5–5.1)
Sodium: 136 mmol/L (ref 135–145)

## 2016-12-30 LAB — GLUCOSE, CAPILLARY
Glucose-Capillary: 237 mg/dL — ABNORMAL HIGH (ref 65–99)
Glucose-Capillary: 212 mg/dL — ABNORMAL HIGH (ref 65–99)
Glucose-Capillary: 201 mg/dL — ABNORMAL HIGH (ref 65–99)
Glucose-Capillary: 248 mg/dL — ABNORMAL HIGH (ref 65–99)

## 2016-12-30 MED ORDER — DOXYCYCLINE HYCLATE 100 MG PO TABS
100.0000 mg | ORAL_TABLET | Freq: Two times a day (BID) | ORAL | Status: DC
  Administered 2016-12-30: 100 mg via ORAL
  Filled 2016-12-30: qty 1

## 2016-12-30 MED ORDER — DOXYCYCLINE HYCLATE 100 MG PO TABS: 100.0000 mg | ORAL_TABLET | Freq: Two times a day (BID) | ORAL | 0 refills | Status: DC

## 2016-12-30 MED ORDER — METRONIDAZOLE 500 MG PO TABS: 500.0000 mg | ORAL_TABLET | Freq: Three times a day (TID) | ORAL | 0 refills | Status: DC

## 2016-12-30 NOTE — Progress Notes (Signed)
   Subjective:  No acute events overnight.  Patient reports no complaints morning.  Discussed plan of discharge home with antibiotics and patient is in agreement.  All questions answered.  Objective:  Vital signs in last 24 hours: Vitals:   12/29/16 1732 12/29/16 2133 12/30/16 0429 12/30/16 0809  BP: 131/77 126/81 109/61 115/72  Pulse: 75 80 73 72  Resp: 18 19 18 18   Temp: 98.1 F (36.7 C) 98.8 F (37.1 C) 98.1 F (36.7 C) 98.2 F (36.8 C)  TempSrc: Oral Oral Oral Oral  SpO2: 99% 100% 99% 99%  Weight:  263 lb 6.4 oz (119.5 kg)    Height:       General: pleasant male, well-developed, well-nourished, sitting up in bed in no acute distress Ext: warm and well perfused, 1+ pitting edema of L foot    Derm: L toe ulcer debrided yesterday. Tissue appears healthy with no necrotic tissue noted, but associated with erythema. He does continue to have foul smelling odor. No discharge noted.    Assessment/Plan:  [name] is a 36yo male with PMH significant for insulin dependent T2DM complicated by diabetic neuropathy who presented with worsening odor and purulent discharge of non-healing ulcer on L great toe which has been present since July 2018.   Left great toe ulcer with overlyingcellulitis: Remains neurovascularly intact and reports pain is well controlled. Wound debrided by podiatry yesterday 11/1 with recommendation of outpatient follow up in 1 week for further debridement and PO antibiotics. Unfortunately, patient unable to follow up with them due to being uninsured. He has a hospital follow [facility]9 and was advised to make a follow up appointment with PCP as well. Will try to set up podiatry follow up for him at his follow up visit. He was transitioned to PO antibiotics and is now on doxycycline and flagyl as below. He was given 2 printed prescriptions for doxycycline and flagyl along with match letter. CM arranged home health RN as well to help with wound care  at home.  -[facility] re-consulted, appreciate recommendations -D/c vancomycin and cefepime  -Start doxycycline 100 mg BID  -Continue metronidazole 500 mg TID -Continue Dilaudid 0.5mg  q4h PRN for pain -Continue wound care recommendations, assistance from nursing staff appreciated -Anticipate discharge today   T2DM complicated by diabetic neuropathy:CBG much improved, in the 200s after increase in Lantus.  -Continue Lantus to 35U -Continue mealtime Novolog to 10 units  -Resistant SSI  -CBG monitoring per unit routine -Continue home gabapentin 600 mg BID  Diet: Carb-modified diet VTE PPx: SQ Lovenox Code status:Full code    Dispo: Anticipated discharge today.   Burna CashIdalys Santos-Sanchez, MD  Internal Medicine PGY-1  P [phone]

## 2016-12-30 NOTE — Progress Notes (Signed)
Jonathan [name] to be D/C'd Home per MD order.  Discussed prescriptions and follow up appointments with the patient. Prescriptions given to patient, medication list explained in detail. Pt verbalized understanding.  Allergies as of 12/30/2016   No Known Allergies     Medication List    TAKE these medications   atorvastatin 40 MG tablet Commonly known as:  LIPITOR Take 1 tablet (40 mg total) by mouth daily.   doxycycline 100 MG tablet Commonly known as:  VIBRA-TABS Take 1 tablet (100 mg total) by mouth every 12 (twelve) hours.   freestyle lancets Use as instructed   TRUEPLUS LANCETS 28G Misc 1 each by Does not apply route 3 (three) times daily before meals.   furosemide 20 MG tablet Commonly known as:  LASIX Take 1 tablet (20 mg total) by mouth daily. When necessary for feet swelling   gabapentin 300 MG capsule Commonly known as:  NEURONTIN Take 2 capsules (600 mg total) by mouth 2 (two) times daily.   glipiZIDE 10 MG 24 hr tablet Commonly known as:  GLUCOTROL XL Take 1 tablet (10 mg total) by mouth daily with breakfast.   glucose blood test strip Use as instructed   glucose blood test strip Commonly known as:  TRUE METRIX BLOOD GLUCOSE TEST Used 3 times before meals   glucose monitoring kit monitoring kit 1 each by Does not apply route as needed.   TRUE METRIX METER Devi 1 each by Does not apply route 3 (three) times daily before meals.   ibuprofen 800 MG tablet Commonly known as:  ADVIL,MOTRIN Take 1 tablet (800 mg total) by mouth 3 (three) times daily. What changed:  when to take this  reasons to take this   Insulin Glargine 100 UNIT/ML Solostar Pen Commonly known as:  LANTUS SOLOSTAR Inject 15 Units into the skin daily at 10 pm. What changed:  how much to take   metFORMIN 1000 MG tablet Commonly known as:  GLUCOPHAGE Take 1 tablet (1,000 mg total) by mouth 2 (two) times daily.   metroNIDAZOLE 500 MG tablet Commonly known as:  FLAGYL Take 1 tablet  (500 mg total) by mouth every 8 (eight) hours.   mupirocin cream 2 % Commonly known as:  BACTROBAN Apply topically daily.       Vitals:   12/30/16 0809 12/30/16 1650  BP: 115/72 134/77  Pulse: 72 99  Resp: 18 18  Temp: 98.2 F (36.8 C) 98 F (36.7 C)  SpO2: 99% 100%    Skin clean, dry and intact, evidence of left great toe diabetic foot ulcer. IV catheter discontinued intact. Site without signs and symptoms of complications. Dressing and pressure applied. Pt denies pain at this time. No complaints noted.  An After Visit Summary was printed and given to the patient. Patient escorted via WC, and D/C to [facility] to pick up vehicle per security.  Nicut RN

## 2016-12-30 NOTE — Progress Notes (Addendum)
Waiting for security to take pt to his [facility].

## 2016-12-30 NOTE — Discharge Instructions (Addendum)
[facility] for an ulcer of your left toe.   Please finish the antibiotics prescribe to you. You will take Flagyl 3 times a day for 5 days. Avoid any kind of alcohol while on this medication. You will also take doxycycline 2 times a day for 5 days. Take the prescriptions you were given in the hospital to any of the pharmacies in the list you were provided.   Continue to check your blood sugar at home. This is very important! Call you regular doctor if your blood sugar is over 400.   You have a scheduled appointment at our Internal Medicine clinic on 11/9 10:45am.  It is important that you follow up. We will help you with a follow up appointment with a foot doctor as well. You will have a nurse that will help you take care of your foot wound at home. Please call [facility] to schedule a follow up appointment with your regular doctor as well.   Please read the information provided below [facility].  After obtaining the required information please call the internal medicine clinic.

## 2016-12-31 LAB — CULTURE, BLOOD (ROUTINE X 2)
CULTURE: NO GROWTH
Culture: NO GROWTH
SPECIAL REQUESTS: ADEQUATE
Special Requests: ADEQUATE

## 2017-01-02 ENCOUNTER — Telehealth: Payer: Self-pay | Admitting: *Deleted

## 2017-01-02 NOTE — Telephone Encounter (Signed)
Thank you, Helen.

## 2017-01-02 NOTE — Telephone Encounter (Signed)
Pharmacy called, they rec'd script for metronidazole  1 every 8 hrs #15 but script not signed, gave VO for med, just an [address]

## 2017-01-06 ENCOUNTER — Other Ambulatory Visit: Payer: Self-pay

## 2017-01-06 ENCOUNTER — Encounter (INDEPENDENT_AMBULATORY_CARE_PROVIDER_SITE_OTHER): Payer: Self-pay

## 2017-01-06 ENCOUNTER — Ambulatory Visit (INDEPENDENT_AMBULATORY_CARE_PROVIDER_SITE_OTHER): Payer: Self-pay | Admitting: Internal Medicine

## 2017-01-06 DIAGNOSIS — R7989 Other specified abnormal findings of blood chemistry: Secondary | ICD-10-CM

## 2017-01-06 DIAGNOSIS — S91109D Unspecified open wound of unspecified toe(s) without damage to nail, subsequent encounter: Secondary | ICD-10-CM

## 2017-01-06 DIAGNOSIS — E118 Type 2 diabetes mellitus with unspecified complications: Secondary | ICD-10-CM

## 2017-01-06 DIAGNOSIS — IMO0002 Reserved for concepts with insufficient information to code with codable children: Secondary | ICD-10-CM

## 2017-01-06 DIAGNOSIS — N529 Male erectile dysfunction, unspecified: Secondary | ICD-10-CM

## 2017-01-06 DIAGNOSIS — E1165 Type 2 diabetes mellitus with hyperglycemia: Secondary | ICD-10-CM

## 2017-01-06 DIAGNOSIS — E1140 Type 2 diabetes mellitus with diabetic neuropathy, unspecified: Secondary | ICD-10-CM

## 2017-01-06 DIAGNOSIS — Z794 Long term (current) use of insulin: Secondary | ICD-10-CM

## 2017-01-06 MED ORDER — METFORMIN HCL 1000 MG PO TABS: 1000.0000 mg | ORAL_TABLET | Freq: Two times a day (BID) | ORAL | 3 refills | Status: DC

## 2017-01-06 MED ORDER — GLIPIZIDE ER 10 MG PO TB24: 10.0000 mg | ORAL_TABLET | Freq: Every day | ORAL | 3 refills | Status: DC

## 2017-01-06 NOTE — Patient Instructions (Signed)
Please continue to keep the wound area clean and properly bandaged as you have been.   Please begin to work more diligently on obtaining the [facility] and try to complete the paperwork given to you as soon as possible to better enable a timely transfer of information.  As previously directed, if you were to develop fever, chills, chest pain, muscle aches, worsening swelling of the foot, redness of the wound area, purulent discharge or pus formation, please call the clinic and revisit the ED.  It is imperative that you maintain tight control of your blood glucose levels.  I would recommend adjusting your insulin to 20 units nightly.  This is a, increase insulin glargine to 20 units each night.  If you awaken with dizziness, fatigue, sweating, lightheadedness, or other concerning symptoms please call the clinic after checking your blood glucose levels.  [facility] today.

## 2017-01-06 NOTE — Progress Notes (Signed)
   CC: left great toe infection  HPI:  [name] is a 36 y.o. male who presents for a follow-up following discharge from the hospital for a week long treatment of a left great toe infection. Patient failed outpatient antibiotic therapy prior to admission. During the admission Xray and MRI ruled out osteomyelitis. He has a past medical history consistent for diabetes mellitus. He was treated there with IV antibiotics and discharged once improvement was noted on doxycycline and flagyl. Patient attested to taking both his doxycycline and flagyl. Patient attested to mild loose watery stool. Patient denied nausea, vomiting, constipation, headaches, visual changes, chest pain, palpitations, abdominal pain or dysuria.  Swelling in left leg has greatly improved, but persistens mildly with mild associated pain. Denied exudate, redness, swelling of the toe. Attested to the wound being dry, absent puss, mild soreness of the ankle and distal lower extremity.   Past Medical History:  Diagnosis Date  . Diabetes mellitus    Review of Systems:  ROS negative except as per HPI.  Family History: Denied family history of known medical conditions.  Social History: Denied tobacco Denied EtOH other than on random occassions less than 2-3 times monthly limited to 1-2 drinks each time Denied Illicit drugs Sexually active, unable to perform given ED Lives at home alone  Physical Exam:  Vitals:   01/06/17 1045  BP: 116/81  Pulse: 96  Temp: 98.4 F (36.9 C)  TempSrc: Oral  SpO2: 100%  Weight: 257 lb 14.4 oz (117 kg)  Height: 6' 5.5" (1.969 m)   Physical Exam  Constitutional: He is oriented to person, place, and time. He appears well-developed and well-nourished. No distress.  HENT:  Head: Normocephalic and atraumatic.  Eyes: Conjunctivae and EOM are normal.  Neck: Normal range of motion.  Cardiovascular: Normal rate and regular rhythm.  No murmur heard. Pulmonary/Chest: Effort normal and  breath sounds normal. No respiratory distress.  Abdominal: Soft. Bowel sounds are normal. He exhibits no distension.  Musculoskeletal: He exhibits edema (Left lower distal extremitiy ), tenderness (Of the left great toe) and deformity (Of the left great toe).  Neurological: He is alert and oriented to person, place, and time.  Skin: Skin is warm. He is not diaphoretic.  Psychiatric: He has a normal mood and affect.  Vitals reviewed.   Assessment & Plan:   See Encounters Tab for problem based charting.  Patient seen with Dr. Oswaldo DoneVincent

## 2017-01-06 NOTE — Assessment & Plan Note (Signed)
Assessment: Patient presents with poorly controlled diabetes mellitus. He was recently hospitalized for diabetic foot ulcer of the left great toe. Patient on glipizide 10 mg daily metformin 1000 mg twice daily as well as insulin glargine 15 units nightly He did not provide a record of his blood glucose levels but states that they are typically in the upper 200s at home. His most recent hemoglobin A1c was 11.8  Plan: Plan the to see the patient back as needed or within 3 months for a visit with his PCP. Advised the patient to increase his insulin 20 units nightly Advised the patient to remain compliant with medications. We will recheck CBG and Humulin A1c at his next visit.

## 2017-01-06 NOTE — Assessment & Plan Note (Addendum)
Assessment: The wound appears to be healing at a rate expected for patient with peripheral neuropathy and poorly controlled diabetes mellitus.  The patient is in need of insurance to to facilitate a referral to podiatry as the patient cannot afford the $4 co-pay at the time of visit. We will continue to assist the patient with obtaining insurance with his application for the orange card.  Plan: Please continue the wrap your left great toe as instructed at the time of her discharge and by her home health nurse. Please complete doxycycline and Flagyl prescriptions as directed. Patient was advised to return to or contact the clinic if he developed any symptoms concerning for spreading infection which he previously experienced.

## 2017-01-06 NOTE — Assessment & Plan Note (Signed)
Assessment: Dry ejaculation Failed testosterone injection  Plan: Obtain insurance the place referral to urology

## 2017-01-09 NOTE — Addendum Note (Signed)
Addended by: Erlinda HongVINCENT, Otilia Kareem T on: 01/09/2017 01:57 PM   Modules accepted: Level of Service

## 2017-01-09 NOTE — Progress Notes (Signed)
Internal Medicine Clinic Attending  I saw and evaluated the patient.  I personally confirmed the key portions of the history and exam documented by Dr. Harbrecht and I reviewed pertinent patient test results.  The assessment, diagnosis, and plan were formulated together and I agree with the documentation in the resident's note.  

## 2017-01-10 ENCOUNTER — Ambulatory Visit: Payer: Self-pay

## 2017-01-23 ENCOUNTER — Other Ambulatory Visit: Payer: Self-pay | Admitting: Family Medicine

## 2017-01-23 ENCOUNTER — Telehealth: Payer: Self-pay | Admitting: Family Medicine

## 2017-01-23 DIAGNOSIS — E1165 Type 2 diabetes mellitus with hyperglycemia: Principal | ICD-10-CM

## 2017-01-23 DIAGNOSIS — IMO0002 Reserved for concepts with insufficient information to code with codable children: Secondary | ICD-10-CM

## 2017-01-23 DIAGNOSIS — E1140 Type 2 diabetes mellitus with diabetic neuropathy, unspecified: Secondary | ICD-10-CM

## 2017-01-23 DIAGNOSIS — R7989 Other specified abnormal findings of blood chemistry: Secondary | ICD-10-CM

## 2017-01-23 NOTE — Telephone Encounter (Signed)
[facility] call [facility]([place]) Pt was see at home no insurance, planing to see one time this week and onetime next week, and them will be discharge, she need to know how we can provide wound care for her to do it her self, Pt has an appt with the pcp 01/30/17

## 2017-01-24 NOTE — Telephone Encounter (Signed)
Advise home care to continue scheduled wound care.  Could you please clarify what the question is?  Thank you

## 2017-01-25 ENCOUNTER — Other Ambulatory Visit: Payer: Self-pay | Admitting: Family Medicine

## 2017-01-25 DIAGNOSIS — IMO0002 Reserved for concepts with insufficient information to code with codable children: Secondary | ICD-10-CM

## 2017-01-25 DIAGNOSIS — R7989 Other specified abnormal findings of blood chemistry: Secondary | ICD-10-CM

## 2017-01-25 DIAGNOSIS — E1140 Type 2 diabetes mellitus with diabetic neuropathy, unspecified: Secondary | ICD-10-CM

## 2017-01-25 DIAGNOSIS — E1165 Type 2 diabetes mellitus with hyperglycemia: Principal | ICD-10-CM

## 2017-01-30 ENCOUNTER — Ambulatory Visit: Payer: Self-pay | Admitting: Family Medicine

## 2017-02-02 ENCOUNTER — Ambulatory Visit: Payer: Self-pay

## 2017-02-13 ENCOUNTER — Other Ambulatory Visit: Payer: Self-pay | Admitting: Pharmacist

## 2017-02-13 DIAGNOSIS — E1165 Type 2 diabetes mellitus with hyperglycemia: Secondary | ICD-10-CM

## 2017-02-13 DIAGNOSIS — Z794 Long term (current) use of insulin: Principal | ICD-10-CM

## 2017-02-13 DIAGNOSIS — E1140 Type 2 diabetes mellitus with diabetic neuropathy, unspecified: Secondary | ICD-10-CM

## 2017-02-13 DIAGNOSIS — IMO0002 Reserved for concepts with insufficient information to code with codable children: Secondary | ICD-10-CM

## 2017-02-13 DIAGNOSIS — E118 Type 2 diabetes mellitus with unspecified complications: Secondary | ICD-10-CM

## 2017-02-13 MED ORDER — METFORMIN HCL 1000 MG PO TABS: 1000.0000 mg | ORAL_TABLET | Freq: Two times a day (BID) | ORAL | 0 refills | Status: DC

## 2017-02-13 MED ORDER — GABAPENTIN 300 MG PO CAPS: 300.0000 mg | ORAL_CAPSULE | Freq: Two times a day (BID) | ORAL | 0 refills | Status: DC

## 2017-02-13 MED ORDER — GLIPIZIDE ER 10 MG PO TB24: 10.0000 mg | ORAL_TABLET | Freq: Every day | ORAL | 0 refills | Status: DC

## 2017-02-13 MED ORDER — INSULIN DETEMIR 100 UNIT/ML FLEXPEN: 15.0000 [IU] | PEN_INJECTOR | Freq: Every day | SUBCUTANEOUS | 0 refills | Status: DC

## 2017-02-13 MED ORDER — ATORVASTATIN CALCIUM 40 MG PO TABS: 40.0000 mg | ORAL_TABLET | Freq: Every day | ORAL | 0 refills | Status: DC

## 2017-02-13 MED FILL — metFORMIN HCL 1000 MG TABS: 1000 | 30 days supply | Qty: 60 | Fill #0

## 2017-02-13 MED FILL — ATORVASTATIN 40 MG TABLET: 40 | 30 days supply | Qty: 30 | Fill #0

## 2017-02-13 MED FILL — glipiZIDE ER 10 MG TB24: 10 | 30 days supply | Qty: 30 | Fill #0

## 2017-02-13 MED FILL — LEVEMIR FLEXTOUCH 100 UNITS: 100 | 40 days supply | Qty: 6 | Fill #0

## 2017-02-13 NOTE — Progress Notes (Signed)
Patient requested help getting medications. Referred patients to [facility] and [facility]

## 2017-03-02 ENCOUNTER — Other Ambulatory Visit: Payer: Self-pay | Admitting: Pharmacist

## 2017-03-02 DIAGNOSIS — E1165 Type 2 diabetes mellitus with hyperglycemia: Principal | ICD-10-CM

## 2017-03-02 DIAGNOSIS — R7989 Other specified abnormal findings of blood chemistry: Secondary | ICD-10-CM

## 2017-03-02 DIAGNOSIS — IMO0002 Reserved for concepts with insufficient information to code with codable children: Secondary | ICD-10-CM

## 2017-03-02 DIAGNOSIS — E1140 Type 2 diabetes mellitus with diabetic neuropathy, unspecified: Secondary | ICD-10-CM

## 2017-03-02 MED ORDER — BASAGLAR KWIKPEN 100 UNIT/ML ~~LOC~~ SOPN: 15.0000 [IU] | PEN_INJECTOR | Freq: Every day | SUBCUTANEOUS | 3 refills | Status: DC

## 2017-03-02 MED ORDER — GLIPIZIDE ER 10 MG PO TB24: 10.0000 mg | ORAL_TABLET | Freq: Every day | ORAL | 3 refills | Status: DC

## 2017-03-02 MED ORDER — ATORVASTATIN CALCIUM 40 MG PO TABS: 40.0000 mg | ORAL_TABLET | Freq: Every day | ORAL | 3 refills | Status: DC

## 2017-03-02 MED ORDER — METFORMIN HCL 1000 MG PO TABS: 1000.0000 mg | ORAL_TABLET | Freq: Two times a day (BID) | ORAL | 3 refills | Status: DC

## 2017-03-02 NOTE — Progress Notes (Signed)
Patient was approved for [facility]. Prescriptions sent. 

## 2017-07-14 ENCOUNTER — Other Ambulatory Visit: Payer: Self-pay

## 2017-07-14 ENCOUNTER — Observation Stay (HOSPITAL_COMMUNITY)
Admission: EM | Admit: 2017-07-14 | Discharge: 2017-07-16 | Disposition: A | Discharge status: Home / Self Care | Payer: Self-pay | Attending: Internal Medicine | Admitting: Internal Medicine

## 2017-07-14 ENCOUNTER — Encounter (HOSPITAL_COMMUNITY): Payer: Self-pay

## 2017-07-14 DIAGNOSIS — IMO0002 Reserved for concepts with insufficient information to code with codable children: Secondary | ICD-10-CM

## 2017-07-14 DIAGNOSIS — E1140 Type 2 diabetes mellitus with diabetic neuropathy, unspecified: Secondary | ICD-10-CM | POA: Insufficient documentation

## 2017-07-14 DIAGNOSIS — L97529 Non-pressure chronic ulcer of other part of left foot with unspecified severity: Secondary | ICD-10-CM | POA: Insufficient documentation

## 2017-07-14 DIAGNOSIS — T383X2A Poisoning by insulin and oral hypoglycemic [antidiabetic] drugs, intentional self-harm, initial encounter: Secondary | ICD-10-CM | POA: Insufficient documentation

## 2017-07-14 DIAGNOSIS — D696 Thrombocytopenia, unspecified: Secondary | ICD-10-CM | POA: Insufficient documentation

## 2017-07-14 DIAGNOSIS — E11621 Type 2 diabetes mellitus with foot ulcer: Secondary | ICD-10-CM | POA: Insufficient documentation

## 2017-07-14 DIAGNOSIS — E291 Testicular hypofunction: Secondary | ICD-10-CM | POA: Insufficient documentation

## 2017-07-14 DIAGNOSIS — E872 Acidosis: Secondary | ICD-10-CM | POA: Insufficient documentation

## 2017-07-14 DIAGNOSIS — R45851 Suicidal ideations: Secondary | ICD-10-CM | POA: Insufficient documentation

## 2017-07-14 DIAGNOSIS — E119 Type 2 diabetes mellitus without complications: Secondary | ICD-10-CM

## 2017-07-14 DIAGNOSIS — E118 Type 2 diabetes mellitus with unspecified complications: Secondary | ICD-10-CM

## 2017-07-14 DIAGNOSIS — Z9119 Patient's noncompliance with other medical treatment and regimen: Secondary | ICD-10-CM | POA: Insufficient documentation

## 2017-07-14 DIAGNOSIS — F332 Major depressive disorder, recurrent severe without psychotic features: Secondary | ICD-10-CM

## 2017-07-14 DIAGNOSIS — R42 Dizziness and giddiness: Secondary | ICD-10-CM | POA: Insufficient documentation

## 2017-07-14 DIAGNOSIS — T50902A Poisoning by unspecified drugs, medicaments and biological substances, intentional self-harm, initial encounter: Secondary | ICD-10-CM

## 2017-07-14 DIAGNOSIS — Z794 Long term (current) use of insulin: Secondary | ICD-10-CM

## 2017-07-14 DIAGNOSIS — Z79899 Other long term (current) drug therapy: Secondary | ICD-10-CM | POA: Insufficient documentation

## 2017-07-14 DIAGNOSIS — F419 Anxiety disorder, unspecified: Secondary | ICD-10-CM | POA: Insufficient documentation

## 2017-07-14 DIAGNOSIS — T39312A Poisoning by propionic acid derivatives, intentional self-harm, initial encounter: Principal | ICD-10-CM | POA: Insufficient documentation

## 2017-07-14 DIAGNOSIS — E1165 Type 2 diabetes mellitus with hyperglycemia: Secondary | ICD-10-CM | POA: Insufficient documentation

## 2017-07-14 DIAGNOSIS — G479 Sleep disorder, unspecified: Secondary | ICD-10-CM | POA: Insufficient documentation

## 2017-07-14 MED ORDER — SODIUM CHLORIDE 0.9 % IV BOLUS
1000.0000 mL | Freq: Once | INTRAVENOUS | Status: AC
  Administered 2017-07-15: 1000 mL via INTRAVENOUS

## 2017-07-14 MED ORDER — DEXTROSE 50 % IV SOLN
50.0000 mL | Freq: Once | INTRAVENOUS | Status: DC
  Filled 2017-07-14: qty 50

## 2017-07-14 NOTE — ED Triage Notes (Signed)
Pt reports that he took 50  ibuprofen, 20  metformin, and 20  glipizide around 830p in an attempt to kill himself. He reports that he recently lost his job and apartment and has been under a lot of stress. Complaining of a "hot" generalized abdominal now. A&Ox4. Ambulatory.

## 2017-07-14 NOTE — ED Notes (Signed)
Poison control recommends: EKG, tylenol, CMP, lactic, contact nephrology- may need dialyzed (transfer to [facility]), look for seizures, hypoglycemia, N/V/D, lactic acidosis, can give octreotide for glipizide, CBG every hour

## 2017-07-14 NOTE — ED Notes (Signed)
Pt has 2 gold tops, 2 red tops, and 1 blue top of extra blood in the main lab if needed

## 2017-07-14 NOTE — ED Provider Notes (Signed)
[facility] DEPT Provider Note   CSN: [id number] Arrival date & time: 07/14/17  2300     History   Chief Complaint Chief Complaint  Patient presents with  . Drug Overdose  . Suicidal    HPI Jonathan [name] is a 37 y.o. male.  Patient presents with suicidal ideation and intentional ingestion of ibuprofen, metformin and glipizide.  He states these pills were ingested around 8:30 PM.  He estimates he took 50 ibuprofen and does not know how any metformin or glipizide but told nursing staff was around 20 of each.  He does state his attempt to hurt himself.  He is been a lot of stress at home.  He denies any nausea or vomiting.  He does feel dizzy and lightheaded.  He denies any chest pain or shortness of breath.  He complains of feeling hot all over.  He is never try to hurt himself in the past.  He is also prescribed insulin but not taking it is today.  The history is provided by the patient and a relative.  Drug Overdose  Pertinent negatives include no chest pain, no abdominal pain and no shortness of breath.    Past Medical History:  Diagnosis Date  . Diabetes mellitus     Patient Active Problem List   Diagnosis Date Noted  . Cellulitis 12/26/2016  . Open toe wound, subsequent encounter 12/12/2016  . Hyperglycemia 12/06/2016  . Diabetic ulcer of left foot ([place]) 12/05/2016  . Diabetic neuropathy ([place]) 10/16/2015  . Erectile dysfunction 08/05/2015  . Low testosterone 09/12/2014  . DM type 2, uncontrolled, with neuropathy ([place]) 06/12/2014  . Diabetes mellitus, type II ([place]) 03/23/2012  . Noncompliance 03/23/2012  . Gastroenteritis and colitis, viral 03/23/2012    History reviewed. No pertinent surgical history.      Home Medications    Prior to Admission medications   Medication Sig Start Date End Date Taking? Authorizing Provider  atorvastatin (LIPITOR) 40 MG tablet Take 1 tablet (40 mg total) by mouth daily. 03/02/17   Forde Dandy,  PharmD  Blood Glucose Monitoring Suppl (TRUE METRIX METER) DEVI 1 each by Does not apply route 3 (three) times daily before meals. 04/12/16   Charlott Rakes, MD  doxycycline (VIBRA-TABS) 100 MG tablet Take 1 tablet (100 mg total) by mouth every 12 (twelve) hours. 12/30/16   Santos-Sanchez, Merlene Morse, MD  furosemide (LASIX) 20 MG tablet Take 1 tablet (20 mg total) by mouth daily. When necessary for feet swelling 12/12/16   Charlott Rakes, MD  gabapentin (NEURONTIN) 300 MG capsule Take 2 capsules (600 mg total) by mouth 2 (two) times daily. 12/12/16   Charlott Rakes, MD  glipiZIDE (GLUCOTROL XL) 10 MG 24 hr tablet Take 1 tablet (10 mg total) by mouth daily with breakfast. 03/02/17   Forde Dandy, PharmD  glucose blood (TRUE METRIX BLOOD GLUCOSE TEST) test strip Used 3 times before meals 04/12/16   Charlott Rakes, MD  glucose blood test strip Use as instructed 06/12/14   Lance Bosch, NP  glucose monitoring kit (FREESTYLE) monitoring kit 1 each by Does not apply route as needed. 06/12/14   Lance Bosch, NP  ibuprofen (ADVIL,MOTRIN) 800 MG tablet Take 1 tablet (800 mg total) by mouth 3 (three) times daily. Patient taking differently: Take 800 mg by mouth every 8 (eight) hours as needed for headache, mild pain or moderate pain.  04/01/15   Domenic Moras, PA-C  Insulin Glargine (BASAGLAR KWIKPEN) 100 UNIT/ML SOPN Inject 0.15  mLs (15 Units total) into the skin at bedtime. 03/02/17   Forde Dandy, PharmD  Lancets (FREESTYLE) lancets Use as instructed 06/12/14   Lance Bosch, NP  metFORMIN (GLUCOPHAGE) 1000 MG tablet Take 1 tablet (1,000 mg total) by mouth 2 (two) times daily. 03/02/17   Forde Dandy, PharmD  mupirocin cream (BACTROBAN) 2 % Apply topically daily. 12/06/16   Thomasene Ripple, MD  TRUEPLUS LANCETS 28G MISC 1 each by Does not apply route 3 (three) times daily before meals. 04/12/16   Charlott Rakes, MD  Doxylamine Succinate, Sleep, (EQ SLEEP AID PO) Take 1-2 tablets by mouth at bedtime as  needed (sleep).  03/15/14  [provider]    Family History Family History  Family history unknown: Yes    Social History Social History   Tobacco Use  . Smoking status: Never Smoker  . Smokeless tobacco: Never Used  Substance Use Topics  . Alcohol use: Yes    Alcohol/week: 1.2 oz    Types: 1 Glasses of wine, 1 Cans of beer per week    Comment: occassional  . Drug use: No     Allergies   Patient has no known allergies.   Review of Systems Review of Systems  Constitutional: Negative for activity change, appetite change and fever.  HENT: Negative for congestion, ear discharge, postnasal drip and sinus pressure.   Respiratory: Negative for cough, chest tightness and shortness of breath.   Cardiovascular: Negative for chest pain.  Gastrointestinal: Negative for abdominal pain, nausea and vomiting.  Genitourinary: Negative for dysuria, hematuria and testicular pain.  Musculoskeletal: Negative for arthralgias.  Skin: Negative for rash.  Neurological: Negative for dizziness, tremors, light-headedness and numbness.  Psychiatric/Behavioral: Positive for decreased concentration, dysphoric mood, sleep disturbance and suicidal ideas. The patient is nervous/anxious.    all other systems are negative except as noted in the HPI and PMH.     Physical Exam Updated Vital Signs BP 127/81 (BP Location: Left Arm)   Pulse 88   Temp 98.6 F (37 C) (Oral)   Resp 18   SpO2 95%   Physical Exam  Constitutional: He is oriented to person, place, and time. He appears well-developed and well-nourished. No distress.  Flat affect  HENT:  Head: Normocephalic and atraumatic.  Mouth/Throat: Oropharynx is clear and moist. No oropharyngeal exudate.  Eyes: Pupils are equal, round, and reactive to light. Conjunctivae and EOM are normal.  Neck: Normal range of motion. Neck supple.  No meningismus.  Cardiovascular: Normal rate, regular rhythm, normal heart sounds and intact distal pulses.   No murmur heard. Pulmonary/Chest: Effort normal and breath sounds normal. No respiratory distress. He exhibits no tenderness.  Abdominal: Soft. There is no tenderness. There is no rebound and no guarding.  Musculoskeletal: Normal range of motion. He exhibits no edema or tenderness.  Neurological: He is alert and oriented to person, place, and time. No cranial nerve deficit. He exhibits normal muscle tone. Coordination normal.  No ataxia on finger to nose bilaterally. No pronator drift. 5/5 strength throughout. CN 2-12 intact.Equal grip strength. Sensation intact.   Skin: Skin is warm.  Psychiatric: He has a normal mood and affect. His behavior is normal.  Nursing note and vitals reviewed.    ED Treatments / Results  Labs (all labs ordered are listed, but only abnormal results are displayed) Labs Reviewed  COMPREHENSIVE METABOLIC PANEL - Abnormal; Notable for the following components:      Result Value   Glucose, Bld 423 (*)  All other components within normal limits  ACETAMINOPHEN LEVEL - Abnormal; Notable for the following components:   Acetaminophen (Tylenol), Serum <10 (*)    All other components within normal limits  CBC - Abnormal; Notable for the following components:   MCH 25.3 (*)    Platelets 138 (*)    All other components within normal limits  BASIC METABOLIC PANEL - Abnormal; Notable for the following components:   Glucose, Bld 356 (*)    Calcium 8.5 (*)    All other components within normal limits  GLUCOSE, CAPILLARY - Abnormal; Notable for the following components:   Glucose-Capillary 474 (*)    All other components within normal limits  CBG MONITORING, ED - Abnormal; Notable for the following components:   Glucose-Capillary 390 (*)    All other components within normal limits  I-STAT CG4 LACTIC ACID, ED - Abnormal; Notable for the following components:   Lactic Acid, Venous 2.77 (*)    All other components within normal limits  CBG MONITORING, ED - Abnormal;  Notable for the following components:   Glucose-Capillary 402 (*)    All other components within normal limits  I-STAT CG4 LACTIC ACID, ED - Abnormal; Notable for the following components:   Lactic Acid, Venous 2.87 (*)    All other components within normal limits  I-STAT CG4 LACTIC ACID, ED - Abnormal; Notable for the following components:   Lactic Acid, Venous 3.59 (*)    All other components within normal limits  CBG MONITORING, ED - Abnormal; Notable for the following components:   Glucose-Capillary 343 (*)    All other components within normal limits  CBG MONITORING, ED - Abnormal; Notable for the following components:   Glucose-Capillary 325 (*)    All other components within normal limits  I-STAT CG4 LACTIC ACID, ED - Abnormal; Notable for the following components:   Lactic Acid, Venous 2.98 (*)    All other components within normal limits  CBG MONITORING, ED - Abnormal; Notable for the following components:   Glucose-Capillary 324 (*)    All other components within normal limits  CBG MONITORING, ED - Abnormal; Notable for the following components:   Glucose-Capillary 315 (*)    All other components within normal limits  ETHANOL  SALICYLATE LEVEL  RAPID URINE DRUG SCREEN, HOSP PERFORMED  BLOOD GAS, ARTERIAL  LACTIC ACID, PLASMA  LACTIC ACID, PLASMA  LACTIC ACID, PLASMA  LACTIC ACID, PLASMA  HEMOGLOBIN A1C  GLUCOSE, RANDOM  I-STAT CG4 LACTIC ACID, ED  I-STAT CG4 LACTIC ACID, ED  I-STAT CG4 LACTIC ACID, ED  CBG MONITORING, ED  CBG MONITORING, ED  CBG MONITORING, ED  CBG MONITORING, ED  CBG MONITORING, ED  CBG MONITORING, ED    EKG EKG Interpretation  Date/Time:  Saturday Jul 15 2017 00:28:18 EDT Ventricular Rate:  86 PR Interval:    QRS Duration: 79 QT Interval:  326 QTC Calculation: 390 R Axis:   43 Text Interpretation:  Sinus rhythm No previous ECGs available Confirmed by Ezequiel Essex [phone]) on 07/15/2017 12:31:03 AM Also confirmed by Ezequiel Essex  [phone]), editor Philomena Doheny [phone])  on 07/15/2017 8:09:13 AM   Radiology No results found.  Procedures Procedures (including critical care time)  Medications Ordered in ED Medications  dextrose 50 % solution 50 mL (has no administration in time range)  sodium chloride 0.9 % bolus 1,000 mL (has no administration in time range)     Initial Impression / Assessment and Plan / ED Course  I have reviewed  the triage vital signs and the nursing notes.  Pertinent labs & imaging results that were available during my care of the patient were reviewed by me and considered in my medical decision making (see chart for details).    Patient with intentional ingestion of ibuprofen, glipizide and metformin.  He is awake and alert with stable vital signs. Poison center recommendations per nursing as above.  May need nephrology consult for dialysis and lactic acidosis.  Will obtain CBGs and labs.  Discussed with poison center.  Patient with normal pH and no acidosis.  Potassium is normal.   Anion gap normal. No indication for emergent dialysis at this time.  Do recommend 24 observation for hypoglycemia.  Discussed with Dr. Alcario Drought.  Lactic acidosis is worsening the patient's pH and mental status are normal.  Discussed with Dr. Moshe Cipro of nephrology.  She agrees no indication for emergent dialysis at this point and believes patient can be admitted to [facility] long.  Patient continues to be hyperglycemic.uncertain of the validity of his story.  Mild lactic acidosis. Continue IVF and monitoring. No indication for dialysis at this time. Will need psych consult when medically clear.   CRITICAL CARE Performed by: Ezequiel Essex Total critical care time:35 minutes Critical care time was exclusive of separately billable procedures and treating other patients. Critical care was necessary to treat or prevent imminent or life-threatening deterioration. Critical care was time spent personally by me on  the following activities: development of treatment plan with patient and/or surrogate as well as nursing, discussions with consultants, evaluation of patient's response to treatment, examination of patient, obtaining history from patient or surrogate, ordering and performing treatments and interventions, ordering and review of laboratory studies, ordering and review of radiographic studies, pulse oximetry and re-evaluation of patient's condition.      Final Clinical Impressions(s) / ED Diagnoses   Final diagnoses:  Intentional drug overdose, initial encounter [facility])    ED Discharge Orders    None       Ezequiel Essex, MD 07/15/17 [phone]

## 2017-07-15 DIAGNOSIS — Z794 Long term (current) use of insulin: Secondary | ICD-10-CM

## 2017-07-15 DIAGNOSIS — T50902A Poisoning by unspecified drugs, medicaments and biological substances, intentional self-harm, initial encounter: Secondary | ICD-10-CM

## 2017-07-15 DIAGNOSIS — E118 Type 2 diabetes mellitus with unspecified complications: Secondary | ICD-10-CM

## 2017-07-15 LAB — BLOOD GAS, ARTERIAL
Acid-base deficit: 0.8 mmol/L (ref 0.0–2.0)
Bicarbonate: 23.1 mmol/L (ref 20.0–28.0)
DRAWN BY: 308601
FIO2: 21
O2 Saturation: 97.4 %
PCO2 ART: 37.8 mmHg (ref 32.0–48.0)
PH ART: 7.403 (ref 7.350–7.450)
Patient temperature: 98.6
pO2, Arterial: 99.5 mmHg (ref 83.0–108.0)

## 2017-07-15 LAB — I-STAT CG4 LACTIC ACID, ED
LACTIC ACID, VENOUS: 2.98 mmol/L — AB (ref 0.5–1.9)
Lactic Acid, Venous: 2.77 mmol/L (ref 0.5–1.9)
Lactic Acid, Venous: 2.87 mmol/L (ref 0.5–1.9)
Lactic Acid, Venous: 3.59 mmol/L (ref 0.5–1.9)

## 2017-07-15 LAB — BASIC METABOLIC PANEL
Anion gap: 10 (ref 5–15)
BUN: 14 mg/dL (ref 6–20)
CALCIUM: 8.5 mg/dL — AB (ref 8.9–10.3)
CHLORIDE: 106 mmol/L (ref 101–111)
CO2: 24 mmol/L (ref 22–32)
CREATININE: 0.98 mg/dL (ref 0.61–1.24)
GFR calc non Af Amer: >60 mL/min (ref >60)
GLUCOSE: 356 mg/dL — AB (ref 65–99)
Potassium: 3.9 mmol/L (ref 3.5–5.1)
Sodium: 140 mmol/L (ref 135–145)

## 2017-07-15 LAB — COMPREHENSIVE METABOLIC PANEL
ALBUMIN: 4 g/dL (ref 3.5–5.0)
ALT: 24 U/L (ref 17–63)
ANION GAP: 13 (ref 5–15)
AST: 23 U/L (ref 15–41)
Alkaline Phosphatase: 102 U/L (ref 38–126)
BUN: 10 mg/dL (ref 6–20)
CHLORIDE: 102 mmol/L (ref 101–111)
CO2: 25 mmol/L (ref 22–32)
CREATININE: 1.09 mg/dL (ref 0.61–1.24)
Calcium: 9.5 mg/dL (ref 8.9–10.3)
GFR calc non Af Amer: >60 mL/min (ref >60)
GLUCOSE: 423 mg/dL — AB (ref 65–99)
Potassium: 4 mmol/L (ref 3.5–5.1)
SODIUM: 140 mmol/L (ref 135–145)
Total Bilirubin: 0.5 mg/dL (ref 0.3–1.2)
Total Protein: 7.3 g/dL (ref 6.5–8.1)

## 2017-07-15 LAB — GLUCOSE, CAPILLARY
GLUCOSE-CAPILLARY: 345 mg/dL — AB (ref 65–99)
GLUCOSE-CAPILLARY: 170 mg/dL — AB (ref 65–99)
GLUCOSE-CAPILLARY: 231 mg/dL — AB (ref 65–99)
Glucose-Capillary: 474 mg/dL — ABNORMAL HIGH (ref 65–99)

## 2017-07-15 LAB — CBG MONITORING, ED
GLUCOSE-CAPILLARY: 315 mg/dL — AB (ref 65–99)
GLUCOSE-CAPILLARY: 325 mg/dL — AB (ref 65–99)
GLUCOSE-CAPILLARY: 343 mg/dL — AB (ref 65–99)
GLUCOSE-CAPILLARY: 390 mg/dL — AB (ref 65–99)
GLUCOSE-CAPILLARY: 402 mg/dL — AB (ref 65–99)
Glucose-Capillary: 324 mg/dL — ABNORMAL HIGH (ref 65–99)

## 2017-07-15 LAB — LACTIC ACID, PLASMA
LACTIC ACID, VENOUS: 3.5 mmol/L — AB (ref 0.5–1.9)
LACTIC ACID, VENOUS: 1.5 mmol/L (ref 0.5–1.9)
Lactic Acid, Venous: 1.6 mmol/L (ref 0.5–1.9)

## 2017-07-15 LAB — CBC
HEMATOCRIT: 44.7 % (ref 39.0–52.0)
Hemoglobin: 14.3 g/dL (ref 13.0–17.0)
MCH: 25.3 pg — ABNORMAL LOW (ref 26.0–34.0)
MCHC: 32 g/dL (ref 30.0–36.0)
MCV: 79.1 fL (ref 78.0–100.0)
PLATELETS: 138 10*3/uL — AB (ref 150–400)
RBC: 5.65 MIL/uL (ref 4.22–5.81)
RDW: 13 % (ref 11.5–15.5)
WBC: 4.8 10*3/uL (ref 4.0–10.5)

## 2017-07-15 LAB — RAPID URINE DRUG SCREEN, HOSP PERFORMED
Amphetamines: NOT DETECTED
BARBITURATES: NOT DETECTED
Benzodiazepines: NOT DETECTED
Cocaine: NOT DETECTED
Opiates: NOT DETECTED
Tetrahydrocannabinol: NOT DETECTED

## 2017-07-15 LAB — ACETAMINOPHEN LEVEL: Acetaminophen (Tylenol), Serum: <10 ug/mL — ABNORMAL LOW (ref 10–30)

## 2017-07-15 LAB — ETHANOL: Alcohol, Ethyl (B): <10 mg/dL (ref <10)

## 2017-07-15 LAB — SALICYLATE LEVEL: Salicylate Lvl: <7 mg/dL (ref 2.8–30.0)

## 2017-07-15 LAB — GLUCOSE, RANDOM: GLUCOSE: 429 mg/dL — AB (ref 65–99)

## 2017-07-15 MED ORDER — INSULIN ASPART 100 UNIT/ML ~~LOC~~ SOLN
0.0000 [IU] | Freq: Three times a day (TID) | SUBCUTANEOUS | Status: DC
  Administered 2017-07-15: 11 [IU] via SUBCUTANEOUS
  Administered 2017-07-15: 15 [IU] via SUBCUTANEOUS

## 2017-07-15 MED ORDER — ACETAMINOPHEN 325 MG PO TABS
650.0000 mg | ORAL_TABLET | Freq: Four times a day (QID) | ORAL | Status: DC | PRN
Start: 2017-07-15 — End: 2017-07-16

## 2017-07-15 MED ORDER — INSULIN ASPART 100 UNIT/ML ~~LOC~~ SOLN: 0.0000 [IU] | Freq: Three times a day (TID) | SUBCUTANEOUS | Status: DC

## 2017-07-15 MED ORDER — INSULIN ASPART 100 UNIT/ML ~~LOC~~ SOLN
0.0000 [IU] | Freq: Three times a day (TID) | SUBCUTANEOUS | Status: DC
  Administered 2017-07-15: 3 [IU] via SUBCUTANEOUS
  Administered 2017-07-16: 8 [IU] via SUBCUTANEOUS
  Administered 2017-07-16: 5 [IU] via SUBCUTANEOUS

## 2017-07-15 MED ORDER — INSULIN ASPART 100 UNIT/ML ~~LOC~~ SOLN: 0.0000 [IU] | Freq: Every day | SUBCUTANEOUS | Status: DC

## 2017-07-15 MED ORDER — SODIUM CHLORIDE 0.9 % IV BOLUS
1000.0000 mL | Freq: Once | INTRAVENOUS | Status: AC
  Administered 2017-07-15: 1000 mL via INTRAVENOUS

## 2017-07-15 MED ORDER — SODIUM CHLORIDE 0.9 % IV SOLN
INTRAVENOUS | Status: DC
  Administered 2017-07-15 (×2): via INTRAVENOUS

## 2017-07-15 MED ORDER — ENOXAPARIN SODIUM 40 MG/0.4ML ~~LOC~~ SOLN
40.0000 mg | SUBCUTANEOUS | Status: DC
  Administered 2017-07-15 – 2017-07-16 (×2): 40 mg via SUBCUTANEOUS
  Filled 2017-07-15 (×2): qty 0.4

## 2017-07-15 MED ORDER — ONDANSETRON HCL 4 MG/2ML IJ SOLN: 4.0000 mg | Freq: Four times a day (QID) | INTRAMUSCULAR | Status: DC | PRN

## 2017-07-15 MED ORDER — ONDANSETRON HCL 4 MG PO TABS: 4.0000 mg | ORAL_TABLET | Freq: Four times a day (QID) | ORAL | Status: DC | PRN

## 2017-07-15 MED ORDER — INSULIN ASPART 100 UNIT/ML ~~LOC~~ SOLN: 0.0000 [IU] | SUBCUTANEOUS | Status: DC

## 2017-07-15 MED ORDER — SODIUM CHLORIDE 0.9 % IV SOLN
INTRAVENOUS | Status: AC
  Administered 2017-07-15 – 2017-07-16 (×2): via INTRAVENOUS

## 2017-07-15 MED ORDER — INSULIN GLARGINE 100 UNIT/ML ~~LOC~~ SOLN
25.0000 [IU] | Freq: Every day | SUBCUTANEOUS | Status: DC
  Administered 2017-07-15: 25 [IU] via SUBCUTANEOUS
  Filled 2017-07-15 (×2): qty 0.25

## 2017-07-15 MED ORDER — ACETAMINOPHEN 650 MG RE SUPP: 650.0000 mg | Freq: Four times a day (QID) | RECTAL | Status: DC | PRN

## 2017-07-15 MED ORDER — BASAGLAR KWIKPEN 100 UNIT/ML ~~LOC~~ SOPN: 25.0000 [IU] | PEN_INJECTOR | Freq: Every day | SUBCUTANEOUS | Status: DC

## 2017-07-15 MED ORDER — INSULIN ASPART 100 UNIT/ML ~~LOC~~ SOLN
0.0000 [IU] | Freq: Every day | SUBCUTANEOUS | Status: DC
  Administered 2017-07-15: 2 [IU] via SUBCUTANEOUS

## 2017-07-15 MED ORDER — SODIUM CHLORIDE 0.9 % IV BOLUS
1000.0000 mL | Freq: Once | INTRAVENOUS | Status: AC
Start: 2017-07-15 — End: 2017-07-15
  Administered 2017-07-15: 1000 mL via INTRAVENOUS

## 2017-07-15 NOTE — Progress Notes (Signed)
CBG 474. MD paged with result, also asked for verification on multiple CBG monitoring orders.

## 2017-07-15 NOTE — Progress Notes (Signed)
PROGRESS NOTE   Jonathan [name]  ZOX:[id number]    DOB: 1980-03-15    DOA: 07/14/2017  PCP: Hoy Register, MD   I have briefly reviewed patients previous medical records in [facility].  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  He states that he took these around 8:30 PM on 07/14/2017 and an attempt to kill himself because he was frustrated with how things were not going the way he wanted them to.  No prior suicidal attempts.  In the ED, Tylenol and salicylate level negative, pH normal, elevated lactate, poison control recommended observation and nephrology did not see need for emergent dialysis.   Assessment & Plan:   Principal Problem:   OD (overdose of drug), intentional self-harm, initial encounter [facility]) Active Problems:   Diabetes mellitus, type II (HCC)   Suicide attempt with intentional drug OD (Ibuprofen, Metformin & Glipizide): History as above. Poison control was contacted by EDP and recommended monitoring 24 hours.  Renal functions remain normal.  No hypoglycemia, in fact has significant hyperglycemia and see discussion below.  Lactic acidosis has resolved.  I discussed with poison control center who recommended that if at 24 hours from time of ingestion, no hypoglycemia, can proceed to treat as would normally.  Psychiatric consulted.  Suicide precautions and sitter in place.  Acetaminophen level and salicylate level negative.  UDS negative.  Uncontrolled type II DM/IDDM with hyperglycemia: A1c 11.8 on 12/06/2016.  Requested repeat A1c.  Poorly controlled.  Obviously holding oral hypoglycemics.  For now continue NovoLog SSI.  Patient reports that he was supposed to be on Lantus 25 units at bedtime but has not taken it since 5 days PTA.   Resume Lantus tonight at bedtime if no hypoglycemia and has persistent hyperglycemia.  Elevated lactate:?  Related to metformin.  Resolved.  As per poison control, no need to monitor further.  Thrombocytopenia: Follow CBC in a.m.   DVT prophylaxis: Lovenox Code Status: Full Family Communication: None at bedside Disposition: To be determined pending psychiatric consultation.   Consultants:  Psychiatry: Input pending.  Procedures:  None  Antimicrobials:  None   Subjective: Content reviewed and examined with RN and suicide sitter in room.  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  Indicates that he checks his blood sugars at home which range between 128 fasting-200.  Has not taken his Lantus since Sunday prior to admission and was supposed to increase it to 25 units daily.  He reports being frustrated that his life was not going in the direction he expected it to and overdosed on above medications.  Currently denies complaints.  Denies suicidal ideations.  ROS: As above.  Objective:  Vitals:   07/15/17 0500 07/15/17 0515 07/15/17 0638 07/15/17 1347  BP: 114/79 124/90 127/70 127/82  Pulse: 77 84 74 75  Resp: Temp:   97.8 F (36.6 C) (!) 97.4 F (36.3 C)  TempSrc:   Oral Oral  SpO2: 99% 99% 99% 100%    Examination:  General exam: Pleasant young male, well-built and nourished lying comfortably supine in bed. Respiratory system: Clear to auscultation. Respiratory effort normal. Cardiovascular system: S1 & S2 heard, RRR. No JVD, murmurs, rubs, gallops or clicks. No pedal edema. Gastrointestinal system: Abdomen  is nondistended, soft and nontender. No organomegaly or masses felt. Normal bowel sounds heard. Central nervous system: Alert and oriented. No focal neurological deficits. Extremities: Symmetric 5 x 5 power. Skin: No rashes, lesions or ulcers Psychiatry: Judgement and insight appear normal. Mood & affect appropriate.      Data Reviewed: I have personally reviewed following labs and imaging studies  CBC: Recent Labs  Lab 07/14/17 2347  WBC 4.8  HGB 14.3  HCT 44.7  MCV 79.1  PLT 138*   Basic Metabolic Panel: Recent Labs  Lab 07/14/17 2347 07/15/17 0646 07/15/17 0812  NA 140 140  --   K 4.0 3.9  --   CL 102 106  --   CO2 25 24  --   GLUCOSE 423* 356* 429*  BUN 10 14  --   CREATININE 1.09 0.98  --   CALCIUM 9.5 8.5*  --    Liver Function Tests: Recent Labs  Lab 07/14/17 2347  AST 23  ALT 24  ALKPHOS 102  BILITOT 0.5  PROT 7.3  ALBUMIN 4.0    CBG: Recent Labs  Lab 07/15/17 0305 07/15/17 0356 07/15/17 0540 07/15/17 0751 07/15/17 1146  GLUCAP 325* 324* 315* 474* 345*    No results found for this or any previous visit (from the past 240 hour(s)).       Radiology Studies: No results found.      Scheduled Meds: . enoxaparin (LOVENOX) injection  40 mg Subcutaneous Q24H  . insulin aspart  0-15 Units Subcutaneous TID WC  . insulin aspart  0-5 Units Subcutaneous QHS   Continuous Infusions: . sodium chloride 125 mL/hr at 07/15/17 0700     LOS: 0 days     Marcellus Scott, MD, FACP, [facility]. [facility] Pager [phone]  If 7PM-7AM, please contact night-coverage [email or web address] Password [email or web address] 07/15/2017, 2:12 PM

## 2017-07-15 NOTE — ED Notes (Signed)
ED TO INPATIENT HANDOFF REPORT  Name/Age/Gender Denice Bors 37 y.o. male  Code Status    Code Status Orders  (From admission, onward)        Start     Ordered   07/15/17 0511  Full code  Continuous     07/15/17 0511    Code Status History    Date Active Date Inactive Code Status Order ID Comments User Context   12/26/2016 0231 12/30/2016 2114 Full Code [id number]  Einar Gip, DO ED   12/05/2016 2024 12/06/2016 1846 Full Code [id number]  Valinda Party, DO Inpatient   03/22/2012 1840 03/24/2012 1723 Full Code [id number]  Reuel Derby, RN Inpatient      Home/SNF/Other Home  Chief Complaint INGESTION / SI / BS 416  Level of Care/Admitting Diagnosis ED Disposition    ED Disposition Condition Comment   [facility] Area: [facility] [[id number]]  Level of Care: Med-Surg [16]  Diagnosis: OD (overdose of drug), intentional self-harm, initial encounter [facility]) [1275170]  Admitting Physician: Etta Quill [phone]  Attending Physician: Etta Quill [[id number]]  PT Class (Do Not Modify): Observation [104]  PT Acc Code (Do Not Modify): Observation [10022]       Medical History Past Medical History:  Diagnosis Date  . Diabetes mellitus     Allergies No Known Allergies  IV Location/Drains/Wounds Patient Lines/Drains/Airways Status   Active Line/Drains/Airways    Name:   Placement date:   Placement time:   Site:   Days:   Peripheral IV 07/15/17 Left Wrist   07/15/17    0028    Wrist   less than 1   Peripheral IV 07/14/17 Right;Lateral;Posterior Wrist   07/14/17    2350    Wrist   1   Wound / Incision (Open or Dehisced) 12/05/16 Diabetic ulcer Toe (Comment  which one) Left   12/05/16    2030    Toe (Comment  which one)   222          Labs/Imaging Results for orders placed or performed during the hospital encounter of 07/14/17 (from the past 48 hour(s))  Blood gas, arterial ([facility] & [facility] ONLY)     Status: None   Collection Time:  07/14/17 12:10 AM  Result Value Ref Range   FIO2 21.00    Delivery systems ROOM AIR    pH, Arterial 7.403 7.350 - 7.450   pCO2 arterial 37.8 32.0 - 48.0 mmHg   pO2, Arterial 99.5 83.0 - 108.0 mmHg   Bicarbonate 23.1 20.0 - 28.0 mmol/L   Acid-base deficit 0.8 0.0 - 2.0 mmol/L   O2 Saturation 97.4 %   Patient temperature 98.6    Collection site RIGHT BRACHIAL    Drawn by [id number]    Sample type ARTERIAL DRAW     Comment: Performed at [facility], [place] [address], [place], [place] [address]  Comprehensive metabolic panel     Status: Abnormal   Collection Time: 07/14/17 11:47 PM  Result Value Ref Range   Sodium 140 135 - 145 mmol/L   Potassium 4.0 3.5 - 5.1 mmol/L   Chloride 102 101 - 111 mmol/L   CO2 25 22 - 32 mmol/L   Glucose, Bld 423 (H) 65 - 99 mg/dL   BUN 10 6 - 20 mg/dL   Creatinine, Ser 1.09 0.61 - 1.24 mg/dL   Calcium 9.5 8.9 - 10.3 mg/dL   Total Protein 7.3 6.5 - 8.1 g/dL   Albumin 4.0  3.5 - 5.0 g/dL   AST 23 15 - 41 U/L   ALT 24 17 - 63 U/L   Alkaline Phosphatase 102 38 - 126 U/L   Total Bilirubin 0.5 0.3 - 1.2 mg/dL   GFR calc non Af Amer >60 >60 mL/min   GFR calc Af Amer >60 >60 mL/min    Comment: (NOTE) The eGFR has been calculated using the CKD EPI equation. This calculation has not been validated in all clinical situations. eGFR's persistently <60 mL/min signify possible Chronic Kidney Disease.    Anion gap 13 5 - 15    Comment: Performed at [facility], [place] [address], [place], [place] [address]  cbc     Status: Abnormal   Collection Time: 07/14/17 11:47 PM  Result Value Ref Range   WBC 4.8 4.0 - 10.5 K/uL   RBC 5.65 4.22 - 5.81 MIL/uL   Hemoglobin 14.3 13.0 - 17.0 g/dL   HCT 44.7 39.0 - 52.0 %   MCV 79.1 78.0 - 100.0 fL   MCH 25.3 (L) 26.0 - 34.0 pg   MCHC 32.0 30.0 - 36.0 g/dL   RDW 13.0 11.5 - 15.5 %   Platelets 138 (L) 150 - 400 K/uL    Comment: REPEATED TO VERIFY SPECIMEN CHECKED FOR CLOTS Performed at  [facility], [place] [address] [place], [place] [address]   Ethanol     Status: None   Collection Time: 07/14/17 11:48 PM  Result Value Ref Range   Alcohol, Ethyl (B) <10 <10 mg/dL    Comment: Performed at [facility], [place] [address], [place], [place] [address]  Salicylate level     Status: None   Collection Time: 07/14/17 11:48 PM  Result Value Ref Range   Salicylate Lvl <9.4 2.8 - 30.0 mg/dL    Comment: Performed at [facility], [place] [address], [place], [place] [address]  Acetaminophen level     Status: Abnormal   Collection Time: 07/14/17 11:48 PM  Result Value Ref Range   Acetaminophen (Tylenol), Serum <10 (L) 10 - 30 ug/mL    Comment: Performed at [facility], [place] [address], [place], [place] [address]  Rapid urine drug screen (hospital performed)     Status: None   Collection Time: 07/14/17 11:48 PM  Result Value Ref Range   Opiates NONE DETECTED NONE DETECTED   Cocaine NONE DETECTED NONE DETECTED   Benzodiazepines NONE DETECTED NONE DETECTED   Amphetamines NONE DETECTED NONE DETECTED   Tetrahydrocannabinol NONE DETECTED NONE DETECTED   Barbiturates NONE DETECTED NONE DETECTED    Comment: (NOTE) DRUG SCREEN FOR MEDICAL PURPOSES ONLY.  IF CONFIRMATION IS NEEDED FOR ANY PURPOSE, NOTIFY LAB WITHIN 5 DAYS. LOWEST DETECTABLE LIMITS FOR URINE DRUG SCREEN Drug Class                     Cutoff (ng/mL) Amphetamine and metabolites    1000 Barbiturate and metabolites    200 Benzodiazepine                 856 Tricyclics and metabolites     300 Opiates and metabolites        300 Cocaine and metabolites        300 THC                            50 Performed at [facility], [place]  [address] [place], [place] [address]   I-Stat CG4 Lactic Acid, ED     Status: Abnormal   Collection Time: 07/14/17 11:58 PM  Result Value Ref Range   Lactic Acid, Venous 2.77 (HH) 0.5 - 1.9 mmol/L    Comment NOTIFIED PHYSICIAN   CBG monitoring, ED     Status: Abnormal   Collection Time: 07/15/17 12:06 AM  Result Value Ref Range   Glucose-Capillary 390 (H) 65 - 99 mg/dL   Comment 1 Notify RN   I-Stat CG4 Lactic Acid, ED     Status: Abnormal   Collection Time: 07/15/17 12:16 AM  Result Value Ref Range   Lactic Acid, Venous 2.87 (HH) 0.5 - 1.9 mmol/L  CBG monitoring, ED     Status: Abnormal   Collection Time: 07/15/17 12:46 AM  Result Value Ref Range   Glucose-Capillary 402 (H) 65 - 99 mg/dL  CBG monitoring, ED     Status: Abnormal   Collection Time: 07/15/17  1:24 AM  Result Value Ref Range   Glucose-Capillary 343 (H) 65 - 99 mg/dL   Comment 1 Notify RN   I-Stat CG4 Lactic Acid, ED     Status: Abnormal   Collection Time: 07/15/17  1:52 AM  Result Value Ref Range   Lactic Acid, Venous 3.59 (HH) 0.5 - 1.9 mmol/L   Comment NOTIFIED PHYSICIAN   I-Stat CG4 Lactic Acid, ED     Status: Abnormal   Collection Time: 07/15/17  2:25 AM  Result Value Ref Range   Lactic Acid, Venous 2.98 (HH) 0.5 - 1.9 mmol/L   Comment NOTIFIED PHYSICIAN   CBG monitoring, ED     Status: Abnormal   Collection Time: 07/15/17  3:05 AM  Result Value Ref Range   Glucose-Capillary 325 (H) 65 - 99 mg/dL   Comment 1 Notify RN   CBG monitoring, ED     Status: Abnormal   Collection Time: 07/15/17  3:56 AM  Result Value Ref Range   Glucose-Capillary 324 (H) 65 - 99 mg/dL   Comment 1 Notify RN    No results found.  Pending Labs Unresulted Labs (From admission, onward)   Start     Ordered   07/15/17 0512  Lactic acid, plasma  STAT Now then every 3 hours,   R     07/15/17 0511   07/15/17 1601  Basic metabolic panel  Once,   R     07/15/17 0511      Vitals/Pain Today's Vitals   07/15/17 0430 07/15/17 0435 07/15/17 0445 07/15/17 0500  BP: 122/83 135/74 122/85 114/79  Pulse: 85 85 79 77  Resp: _0 Temp:      TempSrc:      SpO2: 98% 99% 100% 99%  PainSc:        Isolation Precautions No  active isolations  Medications Medications  0.9 %  sodium chloride infusion ( Intravenous New Bag/Given 07/15/17 0244)  acetaminophen (TYLENOL) tablet 650 mg (has no administration in time range)    Or  acetaminophen (TYLENOL) suppository 650 mg (has no administration in time range)  ondansetron (ZOFRAN) tablet 4 mg (has no administration in time range)    Or  ondansetron (ZOFRAN) injection 4 mg (has no administration in time range)  enoxaparin (LOVENOX) injection 40 mg (has no administration in time range)  insulin aspart (novoLOG) injection 0-9 Units (has no administration in time range)  sodium chloride 0.9 % bolus 1,000 mL (0 mLs Intravenous Stopped 07/15/17 0125)  sodium chloride 0.9 %  bolus 1,000 mL (0 mLs Intravenous Stopped 07/15/17 0213)  sodium chloride 0.9 % bolus 1,000 mL (0 mLs Intravenous Stopped 07/15/17 0441)    Mobility Walks\

## 2017-07-15 NOTE — Progress Notes (Addendum)
CRITICAL VALUE ALERT  Critical Value:  Lactic acid 3.5   Date & Time Notied:  07/15/17 1050  Provider Notified: Waymon Amato

## 2017-07-15 NOTE — H&P (Signed)
History and Physical    Jonathan [name] VEL:[id number] DOB: Dec 25, 1980 DOA: 07/14/2017  PCP: Charlott Rakes, MD  Patient coming from: Home  I have personally briefly reviewed patient's old medical records in [place]  Chief Complaint: OD  HPI: Jonathan [name] is a 37 y.o. male with medical history significant of DM2.  Patient claims to have intentionally ingested a "handful" of ibuprofen, metformin, and glipizide.  He states he took these at around 8:30 PM last evening in an attempt to harm himself due to being in a lot of stress at home.  Estimates he took 50 ibuprofen, and maybe 20 each of metformin and glipizide.  Also prescribed lantus, but didn't take it yesterday.   ED Course: Tylenol and salicylate level neg.  PH normal.  Mild lactic acidosis.  PSN control recommends observation, nephrology consult for possible dialysis.  Nephrology consulted.  Dont feel he needs emergent dialysis at this point.  Patient has had mild lactic acidosis throughout night, and has actually been HYPERglycemic all night in ED (no evidence of the feared hypoglycemia).   Review of Systems: As per HPI otherwise 10 point review of systems negative.   Past Medical History:  Diagnosis Date  . Diabetes mellitus     History reviewed. No pertinent surgical history.   reports that he has never smoked. He has never used smokeless tobacco. He reports that he drinks about 1.2 oz of alcohol per week. He reports that he does not use drugs.  No Known Allergies  Family History  Family history unknown: Yes     Prior to Admission medications   Medication Sig Start Date End Date Taking? Authorizing Provider  Blood Glucose Monitoring Suppl (TRUE METRIX METER) DEVI 1 each by Does not apply route 3 (three) times daily before meals. 04/12/16  Yes Charlott Rakes, MD  glipiZIDE (GLUCOTROL XL) 10 MG 24 hr tablet Take 1 tablet (10 mg total) by mouth daily with breakfast. 03/02/17  Yes Forde Dandy,  PharmD  glucose blood (TRUE METRIX BLOOD GLUCOSE TEST) test strip Used 3 times before meals 04/12/16  Yes Newlin, Enobong, MD  glucose blood test strip Use as instructed 06/12/14  Yes [name] A, NP  glucose monitoring kit (FREESTYLE) monitoring kit 1 each by Does not apply route as needed. 06/12/14  Yes Lance Bosch, NP  ibuprofen (ADVIL,MOTRIN) 200 MG tablet Take 400 mg by mouth every 6 (six) hours as needed for headache, mild pain or moderate pain.   Yes [provider]  Insulin Glargine (BASAGLAR KWIKPEN) 100 UNIT/ML SOPN Inject 0.15 mLs (15 Units total) into the skin at bedtime. Patient taking differently: Inject 25 Units into the skin at bedtime.  03/02/17  Yes Forde Dandy, PharmD  Lancets (FREESTYLE) lancets Use as instructed 06/12/14  Yes Lance Bosch, NP  metFORMIN (GLUCOPHAGE) 1000 MG tablet Take 1 tablet (1,000 mg total) by mouth 2 (two) times daily. 03/02/17  Yes Forde Dandy, PharmD  TRUEPLUS LANCETS 28G MISC 1 each by Does not apply route 3 (three) times daily before meals. 04/12/16  Yes Charlott Rakes, MD  atorvastatin (LIPITOR) 40 MG tablet Take 1 tablet (40 mg total) by mouth daily. Patient not taking: Reported on 07/15/2017 03/02/17   Forde Dandy, PharmD  furosemide (LASIX) 20 MG tablet Take 1 tablet (20 mg total) by mouth daily. When necessary for feet swelling Patient not taking: Reported on 07/15/2017 12/12/16   Charlott Rakes, MD  gabapentin (NEURONTIN) 300 MG capsule Take 2  capsules (600 mg total) by mouth 2 (two) times daily. Patient not taking: Reported on 07/15/2017 12/12/16   Charlott Rakes, MD  mupirocin cream (BACTROBAN) 2 % Apply topically daily. Patient not taking: Reported on 07/15/2017 12/06/16   Thomasene Ripple, MD  Doxylamine Succinate, Sleep, (EQ SLEEP AID PO) Take 1-2 tablets by mouth at bedtime as needed (sleep).  03/15/14  [provider]    Physical Exam: Vitals:   07/15/17 0430 07/15/17 0435 07/15/17 0445 07/15/17 0500  BP:  122/83 135/74 122/85 114/79  Pulse: 85 85 79 77  Resp: _0 Temp:      TempSrc:      SpO2: 98% 99% 100% 99%    Constitutional: NAD, calm, comfortable Eyes: PERRL, lids and conjunctivae normal ENMT: Mucous membranes are moist. Posterior pharynx clear of any exudate or lesions.Normal dentition.  Neck: normal, supple, no masses, no thyromegaly Respiratory: clear to auscultation bilaterally, no wheezing, no crackles. Normal respiratory effort. No accessory muscle use.  Cardiovascular: Regular rate and rhythm, no murmurs / rubs / gallops. No extremity edema. 2+ pedal pulses. No carotid bruits.  Abdomen: no tenderness, no masses palpated. No hepatosplenomegaly. Bowel sounds positive.  Musculoskeletal: no clubbing / cyanosis. No joint deformity upper and lower extremities. Good ROM, no contractures. Normal muscle tone.  Skin: no rashes, lesions, ulcers. No induration Neurologic: CN 2-12 grossly intact. Sensation intact, DTR normal. Strength 5/5 in all 4.  Psychiatric: Normal judgment and insight. Alert and oriented x 3. Normal mood.    Labs on Admission: I have personally reviewed following labs and imaging studies  CBC: Recent Labs  Lab 07/14/17 2347  WBC 4.8  HGB 14.3  HCT 44.7  MCV 79.1  PLT 353*   Basic Metabolic Panel: Recent Labs  Lab 07/14/17 2347  NA 140  K 4.0  CL 102  CO2 25  GLUCOSE 423*  BUN 10  CREATININE 1.09  CALCIUM 9.5   GFR: CrCl cannot be calculated (Unknown ideal weight.). Liver Function Tests: Recent Labs  Lab 07/14/17 2347  AST 23  ALT 24  ALKPHOS 102  BILITOT 0.5  PROT 7.3  ALBUMIN 4.0   No results for input(s): LIPASE, AMYLASE in the last 168 hours. No results for input(s): AMMONIA in the last 168 hours. Coagulation Profile: No results for input(s): INR, PROTIME in the last 168 hours. Cardiac Enzymes: No results for input(s): CKTOTAL, CKMB, CKMBINDEX, TROPONINI in the last 168 hours. BNP (last 3 results) No results for  input(s): PROBNP in the last 8760 hours. HbA1C: No results for input(s): HGBA1C in the last 72 hours. CBG: Recent Labs  Lab 07/15/17 0006 07/15/17 0046 07/15/17 0124 07/15/17 0305 07/15/17 0356  GLUCAP 390* 402* 343* 325* 324*   Lipid Profile: No results for input(s): CHOL, HDL, LDLCALC, TRIG, CHOLHDL, LDLDIRECT in the last 72 hours. Thyroid Function Tests: No results for input(s): TSH, T4TOTAL, FREET4, T3FREE, THYROIDAB in the last 72 hours. Anemia Panel: No results for input(s): VITAMINB12, FOLATE, FERRITIN, TIBC, IRON, RETICCTPCT in the last 72 hours. Urine analysis:    Component Value Date/Time   COLORURINE YELLOW 12/05/2016 2123   APPEARANCEUR CLEAR 12/05/2016 2123   LABSPEC 1.028 12/05/2016 2123   PHURINE 5.0 12/05/2016 2123   GLUCOSEU >=500 (A) 12/05/2016 2123   HGBUR NEGATIVE 12/05/2016 2123   BILIRUBINUR NEGATIVE 12/05/2016 2123   BILIRUBINUR negative 08/05/2015 1010   KETONESUR 5 (A) 12/05/2016 2123   PROTEINUR NEGATIVE 12/05/2016 2123   UROBILINOGEN 0.2 08/05/2015 1010   UROBILINOGEN  0.2 01/06/2014 0118   NITRITE NEGATIVE 12/05/2016 2123   LEUKOCYTESUR NEGATIVE 12/05/2016 2123    Radiological Exams on Admission: No results found.  EKG: Independently reviewed.  Assessment/Plan Principal Problem:   OD (overdose of drug), intentional self-harm, initial encounter [facility]) Active Problems:   Diabetes mellitus, type II ([place])    1. OD - 1. Serial lactates 2. Repeat BMP now 3. Monitor CBG - has been high all night despite alleged glipizide OD 4. IVF: NS at 125 cc/hr, got 3L bolus in ED 5. Psych consult in AM 6. Nephrology sees no emergent need for dialysis, patient can remain at [facility] for now per nephrology 2. DM2 - 1. Hyperglycemic all night, no evidence of hypoglycemia or even significant drop in BGLs. 2. I dont really have a reason to with-hold treatment for hyperglycemia at this point, many hours after alleged ingestion. 3. Will put on sensitive SSI AC,  increase as needed. 4. CBG checks Q2H for now.  DVT prophylaxis: Lovenox Code Status: Full Family Communication: No family in room Disposition Plan: TBD Consults called: EDP spoke with Dr. Moshe Cipro Admission status: Place in [place], [place] Hospitalists Pager [phone]  If 7AM-7PM, please contact day team taking care of patient [email or web address] Password [email or web address]  07/15/2017, 5:13 AM

## 2017-07-16 ENCOUNTER — Encounter (HOSPITAL_COMMUNITY): Payer: Self-pay

## 2017-07-16 ENCOUNTER — Inpatient Hospital Stay (HOSPITAL_COMMUNITY)
Admission: AD | Admit: 2017-07-16 | Discharge: 2017-07-19 | DRG: 885 | Disposition: A | Discharge status: Home / Self Care | Payer: No Typology Code available for payment source | Source: Intra-hospital | Attending: Psychiatry | Admitting: Psychiatry

## 2017-07-16 ENCOUNTER — Other Ambulatory Visit: Payer: Self-pay

## 2017-07-16 DIAGNOSIS — N529 Male erectile dysfunction, unspecified: Secondary | ICD-10-CM | POA: Diagnosis present

## 2017-07-16 DIAGNOSIS — E1165 Type 2 diabetes mellitus with hyperglycemia: Secondary | ICD-10-CM | POA: Diagnosis not present

## 2017-07-16 DIAGNOSIS — F4320 Adjustment disorder, unspecified: Secondary | ICD-10-CM | POA: Diagnosis present

## 2017-07-16 DIAGNOSIS — Z9114 Patient's other noncompliance with medication regimen: Secondary | ICD-10-CM

## 2017-07-16 DIAGNOSIS — T1491XA Suicide attempt, initial encounter: Secondary | ICD-10-CM | POA: Diagnosis not present

## 2017-07-16 DIAGNOSIS — F332 Major depressive disorder, recurrent severe without psychotic features: Principal | ICD-10-CM | POA: Diagnosis present

## 2017-07-16 DIAGNOSIS — Z79899 Other long term (current) drug therapy: Secondary | ICD-10-CM

## 2017-07-16 DIAGNOSIS — E1140 Type 2 diabetes mellitus with diabetic neuropathy, unspecified: Secondary | ICD-10-CM | POA: Diagnosis present

## 2017-07-16 DIAGNOSIS — T50902S Poisoning by unspecified drugs, medicaments and biological substances, intentional self-harm, sequela: Secondary | ICD-10-CM

## 2017-07-16 DIAGNOSIS — Z915 Personal history of self-harm: Secondary | ICD-10-CM

## 2017-07-16 DIAGNOSIS — F329 Major depressive disorder, single episode, unspecified: Secondary | ICD-10-CM | POA: Diagnosis present

## 2017-07-16 DIAGNOSIS — T39312A Poisoning by propionic acid derivatives, intentional self-harm, initial encounter: Secondary | ICD-10-CM | POA: Diagnosis not present

## 2017-07-16 DIAGNOSIS — Z794 Long term (current) use of insulin: Secondary | ICD-10-CM

## 2017-07-16 DIAGNOSIS — Z733 Stress, not elsewhere classified: Secondary | ICD-10-CM | POA: Diagnosis not present

## 2017-07-16 DIAGNOSIS — Z7984 Long term (current) use of oral hypoglycemic drugs: Secondary | ICD-10-CM | POA: Diagnosis not present

## 2017-07-16 DIAGNOSIS — T383X2A Poisoning by insulin and oral hypoglycemic [antidiabetic] drugs, intentional self-harm, initial encounter: Secondary | ICD-10-CM | POA: Diagnosis not present

## 2017-07-16 DIAGNOSIS — D696 Thrombocytopenia, unspecified: Secondary | ICD-10-CM

## 2017-07-16 DIAGNOSIS — F419 Anxiety disorder, unspecified: Secondary | ICD-10-CM | POA: Diagnosis present

## 2017-07-16 LAB — CBC
HCT: 42 % (ref 39.0–52.0)
Hemoglobin: 13.7 g/dL (ref 13.0–17.0)
MCH: 25.4 pg — AB (ref 26.0–34.0)
MCHC: 32.6 g/dL (ref 30.0–36.0)
MCV: 77.9 fL — AB (ref 78.0–100.0)
PLATELETS: 130 10*3/uL — AB (ref 150–400)
RBC: 5.39 MIL/uL (ref 4.22–5.81)
RDW: 13 % (ref 11.5–15.5)
WBC: 4.6 10*3/uL (ref 4.0–10.5)

## 2017-07-16 LAB — GLUCOSE, CAPILLARY
GLUCOSE-CAPILLARY: 302 mg/dL — AB (ref 65–99)
Glucose-Capillary: 242 mg/dL — ABNORMAL HIGH (ref 65–99)

## 2017-07-16 MED ORDER — INSULIN ASPART 100 UNIT/ML ~~LOC~~ SOLN
0.0000 [IU] | Freq: Every day | SUBCUTANEOUS | Status: DC
  Administered 2017-07-16: 4 [IU] via SUBCUTANEOUS
  Administered 2017-07-17 – 2017-07-18 (×2): 2 [IU] via SUBCUTANEOUS

## 2017-07-16 MED ORDER — INSULIN ASPART 100 UNIT/ML ~~LOC~~ SOLN
0.0000 [IU] | Freq: Three times a day (TID) | SUBCUTANEOUS | Status: DC
  Administered 2017-07-17: 11 [IU] via SUBCUTANEOUS
  Administered 2017-07-17: 15 [IU] via SUBCUTANEOUS
  Administered 2017-07-17 – 2017-07-18 (×2): 5 [IU] via SUBCUTANEOUS
  Administered 2017-07-18: 8 [IU] via SUBCUTANEOUS
  Administered 2017-07-18 – 2017-07-19 (×2): 5 [IU] via SUBCUTANEOUS

## 2017-07-16 MED ORDER — INSULIN GLARGINE 100 UNIT/ML ~~LOC~~ SOLN
25.0000 [IU] | Freq: Every day | SUBCUTANEOUS | Status: DC
  Administered 2017-07-16: 25 [IU] via SUBCUTANEOUS

## 2017-07-16 MED ORDER — ALUM & MAG HYDROXIDE-SIMETH 200-200-20 MG/5ML PO SUSP: 30.0000 mL | ORAL | Status: DC | PRN

## 2017-07-16 MED ORDER — BASAGLAR KWIKPEN 100 UNIT/ML ~~LOC~~ SOPN: 30.0000 [IU] | PEN_INJECTOR | Freq: Every day | SUBCUTANEOUS | Status: DC

## 2017-07-16 MED ORDER — MAGNESIUM HYDROXIDE 400 MG/5ML PO SUSP: 30.0000 mL | Freq: Every day | ORAL | Status: DC | PRN

## 2017-07-16 NOTE — Progress Notes (Signed)
I called Behavioral Health to get consult for pt, unable to speak to Psychiatrist on call. Message left on  ED phone.

## 2017-07-16 NOTE — Tx Team (Signed)
Initial Treatment Plan 07/16/2017 4:25 PM [name] ZOX:[id number]    PATIENT STRESSORS:  Patient having financial trouble.   PATIENT STRENGTHS: Ability for insight Capable of independent living General fund of knowledge   PATIENT IDENTIFIED PROBLEMS: "I don't have any problem I was just trying to go to sleep"                     DISCHARGE CRITERIA:  Improved stabilization in mood, thinking, and/or behavior  PRELIMINARY DISCHARGE PLAN: Return to previous living arrangement  PATIENT/FAMILY INVOLVEMENT: This treatment plan has been presented to and reviewed with the patient, Jonathan [name], and/or family member, he patient and family have been given the opportunity to ask questions and make suggestions.  Jerrye Bushy, RN 07/16/2017, 4:25 PM

## 2017-07-16 NOTE — Progress Notes (Addendum)
Patient accepted to [facility] 401, call report to [phone].    Nanine Means, PMHNP

## 2017-07-16 NOTE — Progress Notes (Signed)
Patient ID: Jonathan [name], male   DOB: 25-Feb-1981, 37 y.o.   MRN: [id number] Patient admitted to the unit after a serious overdose attempt.  Patient reports that he took an abundance of his diabetic medications and blood pressure medications.  Patient stated he was not trying to kill himself, but he was trying to go to sleep.  Patient was unable to understand how his actions in anyway put himself and others at risk.   Skin assessment complete.  Patient found to be free of all bruises scars and injury. Patient belongings searched all contraband placed in locker.    Patient admitted to the unit without any incident.

## 2017-07-16 NOTE — Discharge Summary (Signed)
Physician Discharge Summary  Jonathan [name] BJY:[id number] DOB: [birth year]  PCP: Fleet Contras, MD  Admit date: 07/14/2017 Discharge date: 07/16/2017  Recommendations for Outpatient Follow-up:  1. Dr. Fleet Contras, PCP in 1 week.  To be seen with repeat labs (CBC & BMP).  Follow pending hemoglobin A1c that was sent from the hospital.  Home Health: None Equipment/Devices: None  Discharge Condition: Improved and stable CODE STATUS: Full Diet recommendation: Heart healthy & diabetic diet.  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  He states that he took these around 8:30 PM on 07/14/2017 and an attempt to kill himself because he was frustrated with how things were not going the way he wanted them to.  No prior suicidal attempts.  In the ED, Tylenol and salicylate level negative, pH normal, elevated lactate, poison control recommended observation and nephrology did not see need for emergent dialysis.   Assessment & Plan:   Suicide attempt with intentional drug OD (Ibuprofen, Metformin & Glipizide): History as above. Poison control was contacted by EDP and recommended monitoring 24 hours.  Renal functions remain normal.  No hypoglycemia, in fact has significant hyperglycemia and see discussion below.  Lactic acidosis has resolved.  I discussed with poison control center 5/18 who recommended that if at 24 hours from time of ingestion, no hypoglycemia, can proceed to treat as would normally.  Psychiatric  consulted.  Suicide precautions and sitter in place.  Acetaminophen level and salicylate level negative.  UDS negative.  Psychiatry has seen, formal note pending but recommend transfer to [facility] for inpatient psychiatric care and have already procured a bed.  Uncontrolled type II DM/IDDM with hyperglycemia: A1c 11.8 on 12/06/2016.  Requested repeat A1c-pending and can be followed as outpatient.  Poorly controlled.    Lantus at prior home dose 25 units at bedtime was resumed 5/18.  He had not taken this in several days prior to admission.  Fasting blood sugar this morning >200.  At discharge, increase Basaglar/Lantus to 30 units at bedtime and resume oral medications that had been temporarily held.  Patient has been counseled extensively regarding no overdosing on any medications prescription or over-the-counter and he verbalizes understanding.  If CBGs remain persistently elevated >200, may consider adding moderate NovoLog SSI.  Elevated lactate:?  Related to metformin.  Resolved.  As per poison control, no need to monitor further.  Thrombocytopenia:  Labs were not ordered for today.  Will order and follow. Follow CBC's as OP.   Consultants:  Psychiatry: Input pending.  Procedures:  None    Discharge Instructions  Discharge Instructions    Activity as tolerated - No restrictions   Complete by:  As directed    Call MD for:   Complete by:  As directed    Suicidal ideations.   Diet - low sodium heart healthy   Complete by:  As directed    Diet Carb Modified   Complete by:  As directed        Medication List    TAKE these medications   BASAGLAR KWIKPEN 100 UNIT/ML Sopn Inject  0.3 mLs (30 Units total) into the skin at bedtime. What changed:  how much to take   glipiZIDE 10 MG 24 hr tablet Commonly known as:  GLUCOTROL XL Take 1 tablet (10 mg total) by mouth daily with breakfast.   glucose blood test strip Commonly known as:  TRUE METRIX BLOOD GLUCOSE  TEST Used 3 times before meals What changed:  Another medication with the same name was removed. Continue taking this medication, and follow the directions you see here.   ibuprofen 200 MG tablet Commonly known as:  ADVIL,MOTRIN Take 400 mg by mouth every 6 (six) hours as needed for headache, mild pain or moderate pain.   metFORMIN 1000 MG tablet Commonly known as:  GLUCOPHAGE Take 1 tablet (1,000 mg total) by mouth 2 (two) times daily.   TRUE METRIX METER Devi 1 each by Does not apply route 3 (three) times daily before meals. What changed:  Another medication with the same name was removed. Continue taking this medication, and follow the directions you see here.   TRUEPLUS LANCETS 28G Misc 1 each by Does not apply route 3 (three) times daily before meals. What changed:  Another medication with the same name was removed. Continue taking this medication, and follow the directions you see here.      Follow-up Information    Fleet Contras, MD. Schedule an appointment as soon as possible for a visit in 1 week(s).   Specialty:  Internal Medicine Why:  To be seen with repeat labs (CBC & BMP).  Follow-up regarding management of your diabetic medications. Contact information: [address] [place] [address] [phone]          No Known Allergies    Procedures/Studies: No results found.    Subjective: Patient interviewed and examined this morning.  States that he feels fine and denies complaints.  Denies suicidal ideations.  Reports that he was frustrated due to lack of consistent job and income (job at [employer] has been slow and hence does not have consistent work), he invested all his savings by 24 iPhones at an auction which landed up being locked and are no use and he cannot sell them.  He does not follow a diabetic diet and eats only once a day he says.  Denies any other complaints.  As per RN, no acute issues reported.  Discharge Exam:  Vitals:    07/15/17 1655 07/15/17 2031 07/16/17 0205 07/16/17 0611  BP:  (!) 136/93 128/84 130/80  Pulse:  78 66 65  Resp:  18  18  Temp:  98.1 F (36.7 C) 98.1 F (36.7 C) 98 F (36.7 C)  TempSrc:  Oral Oral Oral  SpO2:  99% 100% 100%  Weight: 113.5 kg (250 lb 3.6 oz)     Height:  (2.032 m)       General exam: Pleasant young male, well-built and nourished lying comfortably supine in bed. Respiratory system: Clear to auscultation. Respiratory effort normal. Cardiovascular system: S1 & S2 heard, RRR. No JVD, murmurs, rubs, gallops or clicks. No pedal edema. Gastrointestinal system: Abdomen is nondistended, soft and nontender. No organomegaly or masses felt. Normal bowel sounds heard. Central nervous system: Alert and oriented. No focal neurological deficits. Extremities: Symmetric 5 x 5 power. Skin: No rashes, lesions or ulcers Psychiatry: Judgement and insight appear normal. Mood & affect appropriate.       The results of significant diagnostics from this hospitalization (including imaging, microbiology, ancillary and laboratory) are listed below for reference.  Labs: CBC: Recent Labs  Lab 07/14/17 2347  WBC 4.8  HGB 14.3  HCT 44.7  MCV 79.1  PLT 138*   Basic Metabolic Panel: Recent Labs  Lab 07/14/17 2347 07/15/17 0646 07/15/17 0812  NA 140 140  --   K 4.0 3.9  --   CL 102 106  --   CO2 25 24  --   GLUCOSE 423* 356* 429*  BUN 10 14  --   CREATININE 1.09 0.98  --   CALCIUM 9.5 8.5*  --    Liver Function Tests: Recent Labs  Lab 07/14/17 2347  AST 23  ALT 24  ALKPHOS 102  BILITOT 0.5  PROT 7.3  ALBUMIN 4.0    CBG: Recent Labs  Lab 07/15/17 0540 07/15/17 0751 07/15/17 1146 07/15/17 1709 07/15/17 2057  GLUCAP 315* 474* 345* 170* 231*      Time coordinating discharge: 30 minutes  SIGNED:  Marcellus Scott, MD, FACP, [facility]. [facility] Pager [phone]  If 7PM-7AM, please contact night-coverage [email or web address] Password  [email or web address] 07/16/2017, 1:28 PM

## 2017-07-16 NOTE — Discharge Instructions (Signed)

## 2017-07-16 NOTE — Progress Notes (Addendum)
Information given to patient about a diabetic diet. REport called to John at [facility].  CBG at 1355 was 242. [facility] here to take pt over to [facility]. Pt wearing Maroon scrubs.

## 2017-07-16 NOTE — Progress Notes (Signed)
Per [place], [facility], pt accepted to [facility]- pt signed voluntary admission form. CSW faxed copy to [facility] and original put in transfer packet to arrive with pt at admission. Called [facility] transport to arrange pick up.  Report # for RN- [phone]  Ilean Skill, MSW, LCSW Clinical Social Work 07/16/2017 [phone] weekend coverage

## 2017-07-16 NOTE — Progress Notes (Signed)
[facility] ED called, they said Dr. Gerlean Ren was on call but they didn't know where he was right not. I explained the patient had been here over 24 hours and no one has been here to see him. The person I spoke with explained he would give him my phone number to return the call.

## 2017-07-16 NOTE — Progress Notes (Signed)
Addendum  Platelets: 138>130 Stable. OP follow up. Etiology of Thrombocytopenia not clear. ? Related to NSAID OD.  Marcellus Scott, MD, FACP, [facility]. [facility] Pager [phone]  If 7PM-7AM, please contact night-coverage [email or web address] Password [facility] 07/16/2017, 3:29 PM

## 2017-07-17 DIAGNOSIS — F4320 Adjustment disorder, unspecified: Secondary | ICD-10-CM

## 2017-07-17 DIAGNOSIS — T383X2A Poisoning by insulin and oral hypoglycemic [antidiabetic] drugs, intentional self-harm, initial encounter: Secondary | ICD-10-CM

## 2017-07-17 DIAGNOSIS — Z733 Stress, not elsewhere classified: Secondary | ICD-10-CM

## 2017-07-17 DIAGNOSIS — T39312A Poisoning by propionic acid derivatives, intentional self-harm, initial encounter: Secondary | ICD-10-CM

## 2017-07-17 LAB — GLUCOSE, CAPILLARY
GLUCOSE-CAPILLARY: 351 mg/dL — AB (ref 65–99)
GLUCOSE-CAPILLARY: 322 mg/dL — AB (ref 65–99)
Glucose-Capillary: 350 mg/dL — ABNORMAL HIGH (ref 65–99)
Glucose-Capillary: 232 mg/dL — ABNORMAL HIGH (ref 65–99)
Glucose-Capillary: 228 mg/dL — ABNORMAL HIGH (ref 65–99)

## 2017-07-17 LAB — HEMOGLOBIN A1C
Hgb A1c MFr Bld: >15.5 % — ABNORMAL HIGH (ref 4.8–5.6)
Mean Plasma Glucose: >398 mg/dL

## 2017-07-17 MED ORDER — TRAZODONE HCL 50 MG PO TABS: 50.0000 mg | ORAL_TABLET | Freq: Every evening | ORAL | Status: DC | PRN

## 2017-07-17 MED ORDER — INSULIN ASPART 100 UNIT/ML ~~LOC~~ SOLN
10.0000 [IU] | Freq: Three times a day (TID) | SUBCUTANEOUS | Status: DC
  Administered 2017-07-17 – 2017-07-19 (×5): 10 [IU] via SUBCUTANEOUS

## 2017-07-17 MED ORDER — HYDROXYZINE HCL 25 MG PO TABS: 25.0000 mg | ORAL_TABLET | Freq: Four times a day (QID) | ORAL | Status: DC | PRN

## 2017-07-17 MED ORDER — INSULIN GLARGINE 100 UNIT/ML ~~LOC~~ SOLN
35.0000 [IU] | Freq: Every day | SUBCUTANEOUS | Status: DC
  Administered 2017-07-17: 35 [IU] via SUBCUTANEOUS

## 2017-07-17 NOTE — Progress Notes (Signed)
D: Patient presents angry, but cooperative. Patient expresses that he does not feel he needs to be here. Minimizes severity of overdose suicide attempt. Patient is otherwise pleasant and medication compliant. Patient reports work is stressful, but otherwise he is ready to be discharged. Patient is diabetic and receiving sliding scale insulin. Patient states he slept well last night and did not receive medication for such. Appetite is good, energy normal, concentration good. Depression, hopelessness and anxiety 0/10. Patient denies SI/HI/AVH.  A: Patient checked q15 min, and checks reviewed. Reviewed medication changes with patient and educated on side effects. Educated patient on importance of attending group therapy sessions and educated on several coping skills. Encouarged participation in milieu through recreation therapy and attending meals with peers.  R: Patient receptive to education on medications, and is medication compliant. Patient attending all group therapy, recreation therapy sessions, and cafeteria with peers. No participation, however. Patient contracts for safety on the unit.

## 2017-07-17 NOTE — [facility] Group Notes (Signed)
[facility] Group Notes:  (Nursing/MHT/Case Management/Adjunct)  Date:  07/17/2017  Time:  4:15 pm  Type of Therapy:  Psychoeducational Skills  Participation Level:  Active  Participation Quality:  Appropriate  Affect:  Appropriate  Cognitive:  Appropriate  Insight:  Appropriate  Engagement in Group:  Engaged  Modes of Intervention:  Education  Summary of Progress/Problems: Patient alert and engaged appropriately in group.   Earline Mayotte 07/17/2017, 6:18 PM

## 2017-07-17 NOTE — H&P (Addendum)
Psychiatric Admission Assessment Adult  Patient Identification: Jonathan [name] MRN:  [id number] Date of Evaluation:  07/17/2017 Chief Complaint:  " I feel I made a silly mistake" Principal Diagnosis: S/P Overdose  Diagnosis:   Patient Active Problem List   Diagnosis Date Noted  . Major depressive disorder, recurrent severe without psychotic features (HCC) [F33.2] 07/16/2017  . OD (overdose of drug), intentional self-harm, initial encounter (HCC) [T50.902A] 07/15/2017  . Cellulitis [L03.90] 12/26/2016  . Open toe wound, subsequent encounter [S91.109D] 12/12/2016  . Hyperglycemia [R73.9] 12/06/2016  . Diabetic ulcer of left foot (HCC) [I69.629, L97.529] 12/05/2016  . Diabetic neuropathy (HCC) [E11.40] 10/16/2015  . Erectile dysfunction [N52.9] 08/05/2015  . Low testosterone [R79.89] 09/12/2014  . DM type 2, uncontrolled, with neuropathy (HCC) [E11.40, E11.65] 06/12/2014  . Diabetes mellitus, type II (HCC) [E11.9] 03/23/2012  . Noncompliance [Z91.19] 03/23/2012  . Gastroenteritis and colitis, viral [A08.4] 03/23/2012   History of Present Illness: 37 year old single male,employed. Presented to ED voluntarily following an overdose of ibuprofen, metformin, glipizide. ( 5/17)He reports this was impulsive, unplanned. At this time reports that overdose was not suicidal in intent. States he has been under a lot of stress recently and states that he felt overwhelmed on that day and  wanted some rest, sleep. *ED notes indicate he took 50 Ibuprofens, 20 Glipizides, 10 Metformins and that he expressed suiicidal intention upon initial evaluation. Currently minimizes  neuro-vegetative symptoms of depression He states he has been under significant stress recently. States he bought a number of cell phones as investment to resell and that they have some software lock on them which makes them essentially useless. States " I lost a lot of money".  At present states denies depression and reports " I am doing  all right", stresses desire to be discharged soon.  Associated Signs/Symptoms: Depression Symptoms:  Denies anhedonia, denies changes in sleep, appetite, or energy levels, denies suicidal ideations leading up to admission (Hypo) Manic Symptoms:  None noted or reported  Anxiety Symptoms:  Endorses anxiety related to stressor as described above. Psychotic Symptoms: denies  PTSD Symptoms: Does not endorse  Total Time spent with patient: 45 minutes  Past Psychiatric History: states he has had no prior psychiatric admissions, he has never before attempted suicide, denies history of self injurious behaviors, denies history of psychosis, denies history of severe episodes of depression or history of mania. Denies history of panic or agoraphobia .  Is the patient at risk to self? Yes.    Has the patient been a risk to self in the past 6 months? No.  Has the patient been a risk to self within the distant past? No.  Is the patient a risk to others? No.  Has the patient been a risk to others in the past 6 months? No.  Has the patient been a risk to others within the distant past? No.   Prior Inpatient Therapy:  denies  Prior Outpatient Therapy:  denies   Alcohol Screening: 1. How often do you have a drink containing alcohol?: Never 2. How many drinks containing alcohol do you have on a typical day when you are drinking?: 1 or 2 3. How often do you have six or more drinks on one occasion?: Never AUDIT-C Score: 0 4. How often during the last year have you found that you were not able to stop drinking once you had started?: Never 5. How often during the last year have you failed to do what was normally expected from you  becasue of drinking?: Never 6. How often during the last year have you needed a first drink in the morning to get yourself going after a heavy drinking session?: Never 7. How often during the last year have you had a feeling of guilt of remorse after drinking?: Never 8. How often  during the last year have you been unable to remember what happened the night before because you had been drinking?: Never 9. Have you or someone else been injured as a result of your drinking?: No 10. Has a relative or friend or a doctor or another health worker been concerned about your drinking or suggested you cut down?: No Alcohol Use Disorder Identification Test Final Score (AUDIT): 0 Intervention/Follow-up: Patient Refused Substance Abuse History in the last 12 months:  Denies alcohol or drug abuse history Consequences of Substance Abuse: None  Previous Psychotropic Medications: no prior psychiatric medication management Psychological Evaluations:  No Past Medical History: remarkable for DM. Past Medical History:  Diagnosis Date  . Diabetes mellitus    History reviewed. No pertinent surgical history. Family History: parents separated, mother lives in [place], father lives in [place], has 6 siblings  Family History  Family history unknown: Yes   Family Psychiatric  History: denies history of family mental illness, no family history of suicides or of substance abuse  Tobacco Screening: Have you used any form of tobacco in the last 30 days? (Cigarettes, Smokeless Tobacco, Cigars, and/or Pipes): No Social History: 36, single, no children, employed  Social History   Substance and Sexual Activity  Alcohol Use Yes  . Alcohol/week: 1.2 oz  . Types: 1 Glasses of wine, 1 Cans of beer per week   Comment: occassional     Social History   Substance and Sexual Activity  Drug Use No    Additional Social History: Marital status: Single Are you sexually active?: Yes What is your sexual orientation?: heterosexual Has your sexual activity been affected by drugs, alcohol, medication, or emotional stress?: no Does patient have children?: No  Allergies:  No Known Allergies Lab Results:  Results for orders placed or performed during the hospital encounter of 07/16/17 (from the past 48 hour(s))   Glucose, capillary     Status: Abnormal   Collection Time: 07/16/17 10:11 PM  Result Value Ref Range   Glucose-Capillary 302 (H) 65 - 99 mg/dL  Glucose, capillary     Status: Abnormal   Collection Time: 07/17/17  6:05 AM  Result Value Ref Range   Glucose-Capillary 232 (H) 65 - 99 mg/dL   Comment 1 Notify RN    Comment 2 Document in Chart   Glucose, capillary     Status: Abnormal   Collection Time: 07/17/17  1:00 PM  Result Value Ref Range   Glucose-Capillary 351 (H) 65 - 99 mg/dL   Comment 1 Notify RN    Comment 2 Document in Chart     Blood Alcohol level:  Lab Results  Component Value Date   ETH <10 07/14/2017   ETH  04/22/2008    <5        LOWEST DETECTABLE LIMIT FOR SERUM ALCOHOL IS 5 mg/dL FOR MEDICAL PURPOSES ONLY    Metabolic Disorder Labs:  Lab Results  Component Value Date   HGBA1C >15.5 (H) 07/15/2017   MPG >398 07/15/2017   MPG 291.96 12/06/2016   No results found for: PROLACTIN Lab Results  Component Value Date   CHOL 141 08/06/2015   TRIG 109 08/06/2015   HDL 47 08/06/2015  CHOLHDL 3.0 08/06/2015   VLDL 22 08/06/2015   LDLCALC 72 08/06/2015   LDLCALC 57 06/12/2014    Current Medications: Current Facility-Administered Medications  Medication Dose Route Frequency Provider Last Rate Last Dose  . alum & mag hydroxide-simeth (MAALOX/MYLANTA) 200-200-20 MG/5ML suspension 30 mL  30 mL Oral Q4H PRN Charm Rings, NP      . insulin aspart (novoLOG) injection 0-15 Units  0-15 Units Subcutaneous TID WC Charm Rings, NP   15 Units at 07/17/17 1307  . insulin aspart (novoLOG) injection 0-5 Units  0-5 Units Subcutaneous QHS Charm Rings, NP   4 Units at 07/16/17 2133  . insulin glargine (LANTUS) injection 25 Units  25 Units Subcutaneous QHS Charm Rings, NP   25 Units at 07/16/17 2133  . magnesium hydroxide (MILK OF MAGNESIA) suspension 30 mL  30 mL Oral Daily PRN Charm Rings, NP       PTA Medications: Medications Prior to Admission   Medication Sig Dispense Refill Last Dose  . Blood Glucose Monitoring Suppl (TRUE METRIX METER) DEVI 1 each by Does not apply route 3 (three) times daily before meals. 1 Device 0 Taking  . glipiZIDE (GLUCOTROL XL) 10 MG 24 hr tablet Take 1 tablet (10 mg total) by mouth daily with breakfast. 90 tablet 3 07/14/2017 at Unknown time  . glucose blood (TRUE METRIX BLOOD GLUCOSE TEST) test strip Used 3 times before meals 100 each 12 Taking  . ibuprofen (ADVIL,MOTRIN) 200 MG tablet Take 400 mg by mouth every 6 (six) hours as needed for headache, mild pain or moderate pain.   07/14/2017 at Unknown time  . Insulin Glargine (BASAGLAR KWIKPEN) 100 UNIT/ML SOPN Inject 0.3 mLs (30 Units total) into the skin at bedtime.     . metFORMIN (GLUCOPHAGE) 1000 MG tablet Take 1 tablet (1,000 mg total) by mouth 2 (two) times daily. 180 tablet 3 07/14/2017 at Unknown time  . TRUEPLUS LANCETS 28G MISC 1 each by Does not apply route 3 (three) times daily before meals. 100 each 12 Taking    Musculoskeletal: Strength & Muscle Tone: within normal limits Gait & Station: normal Patient leans: N/A  Psychiatric Specialty Exam: Physical Exam  Review of Systems  Constitutional: Negative.   HENT: Negative.   Eyes: Negative.   Respiratory: Negative.   Cardiovascular: Negative.   Gastrointestinal: Negative.   Genitourinary: Negative.   Musculoskeletal: Negative.   Skin: Negative.   Neurological: Negative.  Negative for seizures.  Endo/Heme/Allergies: Negative.   Psychiatric/Behavioral:       Currently denies SI  All other systems reviewed and are negative.   Blood pressure 120/90, pulse (!) 121, temperature 98 F (36.7 C), temperature source Oral, resp. rate 18, height  (2.032 m), weight 112 kg (247 lb), SpO2 100 %.Body mass index is 27.13 kg/m.  General Appearance: Well Groomed  Eye Contact:  Good  Speech:  Normal Rate  Volume:  Decreased  Mood:  denies feeling depressed, states mood is "OK"  Affect:   appropriate, reactive, vaguely anxious   Thought Process:  Linear and Descriptions of Associations: Intact  Orientation:  Other:  fully alert and attentive   Thought Content:  denies hallucinations, no delusions, not internally preoccupied, does not appear internally preoccupied   Suicidal Thoughts:  No denies suicidal or self injurious ideations, denies any homicidal or violent ideations, contracts for safety on unit   Homicidal Thoughts:  No  Memory:  recent and remote grossly intact   Judgement:  Fair  Insight:  Fair  Psychomotor Activity:  Normal  Concentration:  Concentration: Good and Attention Span: Good  Recall:  Good  Fund of Knowledge:  Good  Language:  Good  Akathisia:  Negative  Handed:  Right  AIMS (if indicated):     Assets:  Communication Skills Desire for Improvement Resilience  ADL's:  Intact  Cognition:  WNL  Sleep:  Number of Hours: 5    Treatment Plan Summary: Daily contact with patient to assess and evaluate symptoms and progress in treatment, Medication management, Plan inpatient treatment  and medications as below  Observation Level/Precautions:  15 minute checks  Laboratory:  as needed   Psychotherapy:  Milieu, group therapy   Medications:  Currently denies depression , states he does not think he needs to be on any standing psychiatric medication  Consultations:  As needed - will requestd hospitalist consultation/diabetic care for assistance with diabetic management  Discharge Concerns:  -   Estimated LOS: 3-4 days   Other:     Physician Treatment Plan for Primary Diagnosis: S/P Overdose  Long Term Goal(s): Improvement in symptoms so as ready for discharge  Short Term Goals: Ability to identify changes in lifestyle to reduce recurrence of condition will improve, Ability to verbalize feelings will improve, Ability to disclose and discuss suicidal ideas, Ability to demonstrate self-control will improve and Ability to identify and develop effective coping  behaviors will improve  Physician Treatment Plan for Secondary Diagnosis: Adjustment Disorder  Long Term Goal(s): Improvement in symptoms so as ready for discharge  Short Term Goals: Ability to identify changes in lifestyle to reduce recurrence of condition will improve and Ability to maintain clinical measurements within normal limits will improve  I certify that inpatient services furnished can reasonably be expected to improve the patient's condition.    Craige Cotta, MD 5/20/20192:20 PM

## 2017-07-17 NOTE — Plan of Care (Signed)
Patient came in with serious overdose/suicide attempt. Patient minimizes attempt, however, is cooperative with staff. Patient expresses his anger with being admitted appropriately and is cooperative with staff. Denies SI at this time.

## 2017-07-17 NOTE — Tx Team (Addendum)
Interdisciplinary Treatment and Diagnostic Plan Update  07/17/2017 Time of Session: 10:30am Jonathan [name] MRN: [id number]  Principal Diagnosis: <principal problem not specified>  Secondary Diagnoses: Active Problems:   Major depressive disorder, recurrent severe without psychotic features ([place])   Current Medications:  Current Facility-Administered Medications  Medication Dose Route Frequency Provider Last Rate Last Dose  . alum & mag hydroxide-simeth (MAALOX/MYLANTA) 200-200-20 MG/5ML suspension 30 mL  30 mL Oral Q4H PRN [name], NP      . insulin aspart (novoLOG) injection 0-15 Units  0-15 Units Subcutaneous TID WC [name], NP   5 Units at 07/17/17 [phone]  . insulin aspart (novoLOG) injection 0-5 Units  0-5 Units Subcutaneous QHS [name], NP   4 Units at 07/16/17 2133  . insulin glargine (LANTUS) injection 25 Units  25 Units Subcutaneous QHS [name], NP   25 Units at 07/16/17 2133  . magnesium hydroxide (MILK OF MAGNESIA) suspension 30 mL  30 mL Oral Daily PRN [name], NP       PTA Medications: Medications Prior to Admission  Medication Sig Dispense Refill Last Dose  . Blood Glucose Monitoring Suppl (TRUE METRIX METER) DEVI 1 each by Does not apply route 3 (three) times daily before meals. 1 Device 0 Taking  . glipiZIDE (GLUCOTROL XL) 10 MG 24 hr tablet Take 1 tablet (10 mg total) by mouth daily with breakfast. 90 tablet 3 07/14/2017 at Unknown time  . glucose blood (TRUE METRIX BLOOD GLUCOSE TEST) test strip Used 3 times before meals 100 each 12 Taking  . ibuprofen (ADVIL,MOTRIN) 200 MG tablet Take 400 mg by mouth every 6 (six) hours as needed for headache, mild pain or moderate pain.   07/14/2017 at Unknown time  . Insulin Glargine (BASAGLAR KWIKPEN) 100 UNIT/ML SOPN Inject 0.3 mLs (30 Units total) into the skin at bedtime.     . metFORMIN (GLUCOPHAGE) 1000 MG tablet Take 1 tablet (1,000 mg total) by mouth 2 (two) times daily. 180 tablet 3  07/14/2017 at Unknown time  . TRUEPLUS LANCETS 28G MISC 1 each by Does not apply route 3 (three) times daily before meals. 100 each 12 Taking    Patient Stressors:    Patient Strengths: Ability for insight Capable of independent living General fund of knowledge  Treatment Modalities: Medication Management, Group therapy, Case management,  1 to 1 session with clinician, Psychoeducation, Recreational therapy.   Physician Treatment Plan for Primary Diagnosis: <principal problem not specified> Long Term Goal(s):     Short Term Goals:    Medication Management: Evaluate patient's response, side effects, and tolerance of medication regimen.  Therapeutic Interventions: 1 to 1 sessions, Unit Group sessions and Medication administration.  Evaluation of Outcomes: Not Met  Physician Treatment Plan for Secondary Diagnosis: Active Problems:   Major depressive disorder, recurrent severe without psychotic features (Jonathan [name])  Long Term Goal(s):     Short Term Goals:       Medication Management: Evaluate patient's response, side effects, and tolerance of medication regimen.  Therapeutic Interventions: 1 to 1 sessions, Unit Group sessions and Medication administration.  Evaluation of Outcomes: Not Met   RN Treatment Plan for Primary Diagnosis: <principal problem not specified> Long Term Goal(s): Knowledge of disease and therapeutic regimen to maintain health will improve  Short Term Goals: Ability to verbalize frustration and anger appropriately will improve, Ability to demonstrate self-control, Ability to participate in decision making will improve, Ability to disclose and discuss suicidal ideas, Ability to identify and  develop effective coping behaviors will improve and Compliance with prescribed medications will improve  Medication Management: RN will administer medications as ordered by provider, will assess and evaluate patient's response and provide education to patient for prescribed  medication. RN will report any adverse and/or side effects to prescribing provider.  Therapeutic Interventions: 1 on 1 counseling sessions, Psychoeducation, Medication administration, Evaluate responses to treatment, Monitor vital signs and CBGs as ordered, Perform/monitor CIWA, COWS, AIMS and Fall Risk screenings as ordered, Perform wound care treatments as ordered.  Evaluation of Outcomes: Not Met   LCSW Treatment Plan for Primary Diagnosis: <principal problem not specified> Long Term Goal(s): Safe transition to appropriate next level of care at discharge, Engage patient in therapeutic group addressing interpersonal concerns.  Short Term Goals: Engage patient in aftercare planning with referrals and resources, Increase social support, Increase ability to appropriately verbalize feelings, Increase emotional regulation, Facilitate acceptance of mental health diagnosis and concerns, Facilitate patient progression through stages of change regarding substance use diagnoses and concerns, Identify triggers associated with mental health/substance abuse issues and Increase skills for wellness and recovery  Therapeutic Interventions: Assess for all discharge needs, 1 to 1 time with Social worker, Explore available resources and support systems, Assess for adequacy in community support network, Educate family and significant other(s) on suicide prevention, Complete Psychosocial Assessment, Interpersonal group therapy.  Evaluation of Outcomes: Not Met   Progress in Treatment: Attending groups: No. Participating in groups: No. Taking medication as prescribed: Yes. Toleration medication: Yes. Family/Significant other contact made: No, will contact:  CSW will continue to assess for an appropriate discharge plan Patient understands diagnosis: Yes. Discussing patient identified problems/goals with staff: Yes. Medical problems stabilized or resolved: Yes. Denies suicidal/homicidal ideation:  Yes. Issues/concerns per patient self-inventory: No. Other:   New problem(s) identified: None   New Short Term/Long Term Goal(s): medication stabilization, elimination of SI thoughts, development of comprehensive mental wellness plan.   Patient Goals: "I just want to get out of here"  Discharge Plan or Barriers: CSW will continue to assess for an appropriate discharge plan  Reason for Continuation of Hospitalization: Depression Medication stabilization  Estimated Length of Stay: Wednesday, 07/19/17  Attendees: Patient: Jonathan [name] 07/17/2017 8:48 AM  Physician: Dr. Neita Garnet, MD 07/17/2017 8:48 AM  Nursing: Roderic Palau RN 07/17/2017 8:48 AM  RN Care Manager: Rhunette Croft 07/17/2017 8:48 AM  Social Worker: Radonna Ricker, [place] 07/17/2017 8:48 AM  Recreational Therapist: Rhunette Croft 07/17/2017 8:48 AM  Other: X 07/17/2017 8:48 AM  Other: X 07/17/2017 8:48 AM  Other:X 07/17/2017 8:48 AM    Scribe for Treatment Team: Marylee Floras, [place] 07/17/2017 8:48 AM

## 2017-07-17 NOTE — [facility] Suicide Risk Assessment (Addendum)
[facility] Admission Suicide Risk Assessment   Nursing information obtained from:  Patient Demographic factors:  Male, Living alone Current Mental Status:  NA Loss Factors:  NA Historical Factors:  NA Risk Reduction Factors:  Employed, Religious beliefs about death  Total Time spent with patient: 45 minutes Principal Problem: S/P Overdose  Diagnosis:   Patient Active Problem List   Diagnosis Date Noted  . Major depressive disorder, recurrent severe without psychotic features (HCC) [F33.2] 07/16/2017  . OD (overdose of drug), intentional self-harm, initial encounter (HCC) [T50.902A] 07/15/2017  . Cellulitis [L03.90] 12/26/2016  . Open toe wound, subsequent encounter [S91.109D] 12/12/2016  . Hyperglycemia [R73.9] 12/06/2016  . Diabetic ulcer of left foot (HCC) [Z61.096, L97.529] 12/05/2016  . Diabetic neuropathy (HCC) [E11.40] 10/16/2015  . Erectile dysfunction [N52.9] 08/05/2015  . Low testosterone [R79.89] 09/12/2014  . DM type 2, uncontrolled, with neuropathy (HCC) [E11.40, E11.65] 06/12/2014  . Diabetes mellitus, type II (HCC) [E11.9] 03/23/2012  . Noncompliance [Z91.19] 03/23/2012  . Gastroenteritis and colitis, viral [A08.4] 03/23/2012    Continued Clinical Symptoms:  Alcohol Use Disorder Identification Test Final Score (AUDIT): 0 The "Alcohol Use Disorders Identification Test", Guidelines for Use in Primary Care, Second Edition.  World [employer] writer [facility]). Score between 0-7:  no or low risk or alcohol related problems. Score between 8-15:  moderate risk of alcohol related problems. Score between 16-19:  high risk of alcohol related problems. Score 20 or above:  warrants further diagnostic evaluation for alcohol dependence and treatment.   CLINICAL FACTORS:   37 year old male, presented to ED following overdose on NSAID and diabetic medications ( 5/17). Reports overdose was unplanned, impulsive, and in the context of being under significant stress. Minimizes/denies  depression or neuro-vegetative symptoms leading up to admission. Denies prior psychiatric history.    Psychiatric Specialty Exam: Physical Exam  ROS  Blood pressure 120/90, pulse (!) 121, temperature 98 F (36.7 C), temperature source Oral, resp. rate 18, height  (2.032 m), weight 112 kg (247 lb), SpO2 100 %.Body mass index is 27.13 kg/m.  See admit note MSE   COGNITIVE FEATURES THAT CONTRIBUTE TO RISK:  Closed-mindedness and Loss of executive function    SUICIDE RISK:   Moderate:  Frequent suicidal ideation with limited intensity, and duration, some specificity in terms of plans, no associated intent, good self-control, limited dysphoria/symptomatology, some risk factors present, and identifiable protective factors, including available and accessible social support.  PLAN OF CARE: Patient will be admitted to inpatient psychiatric unit for stabilization and safety. Will provide and encourage milieu participation. Provide medication management and maked adjustments as needed.  Will follow daily.    I certify that inpatient services furnished can reasonably be expected to improve the patient's condition.   Craige Cotta, MD 07/17/2017, 2:59 PM

## 2017-07-17 NOTE — [facility] Suicide Risk Assessment (Addendum)
[facility] INPATIENT:  Family/Significant Other Suicide Prevention Education  Suicide Prevention Education:  Contact Attempts: Al Pimple, brother, [phone],  has been identified by the patient as the family member/significant other with whom the patient will be residing, and identified as the person(s) who will aid the patient in the event of a mental health crisis.  With written consent from the patient, two attempts were made to provide suicide prevention education, prior to and/or following the patient's discharge.  We were unsuccessful in providing suicide prevention education.  A suicide education pamphlet was given to the patient to share with family/significant other.  Date and time of first attempt:07/17/17, 1254 Date and time of second attempt: 07/18/17, 1348  Lorri Frederick, LCSW 07/17/2017, 12:54 PM

## 2017-07-17 NOTE — [facility] Group Notes (Signed)
[facility] LCSW Group Therapy Note  Date/Time: 07/17/17, 1315  Type of Therapy and Topic:  Group Therapy:  Overcoming Obstacles  Participation Level:  Did not attend  Description of Group:    In this group patients will be encouraged to explore what they see as obstacles to their own wellness and recovery. They will be guided to discuss their thoughts, feelings, and behaviors related to these obstacles. The group will process together ways to cope with barriers, with attention given to specific choices patients can make. Each patient will be challenged to identify changes they are motivated to make in order to overcome their obstacles. This group will be process-oriented, with patients participating in exploration of their own experiences as well as giving and receiving support and challenge from other group members.  Therapeutic Goals: 1. Patient will identify personal and current obstacles as they relate to admission. 2. Patient will identify barriers that currently interfere with their wellness or overcoming obstacles.  3. Patient will identify feelings, thought process and behaviors related to these barriers. 4. Patient will identify two changes they are willing to make to overcome these obstacles:    Summary of Patient Progress      Therapeutic Modalities:   Cognitive Behavioral Therapy Solution Focused Therapy Motivational Interviewing Relapse Prevention Therapy  Greg Jabri Blancett, LCSW 

## 2017-07-17 NOTE — Progress Notes (Signed)
Inpatient Diabetes Program Recommendations  AACE/ADA: New Consensus Statement on Inpatient Glycemic Control (2015)  Target Ranges:  Prepandial:   less than 140 mg/dL      Peak postprandial:   less than 180 mg/dL (1-2 hours)      Critically ill patients:  140 - 180 mg/dL   Results for Jonathan [name], Jonathan [name] (MRN [id number]) as of 07/17/2017 14:01  Ref. Range 07/16/2017 13:58 07/16/2017 22:11  Glucose-Capillary Latest Ref Range: 65 - 99 mg/dL 409 (H) 811 (H)   Results for [name], [name] (MRN [id number]) as of 07/17/2017 14:01  Ref. Range 07/17/2017 06:05 07/17/2017 13:00  Glucose-Capillary Latest Ref Range: 65 - 99 mg/dL 213 (H) 086 (H)    Home DM Meds: Basaglar 30 units QHS        Glipizide 10 mg daily       Metformin 1000 mg BID  Current Orders: Lantus 25 units QHS       Novolog Moderate Correction Scale/ SSI (0-15 units) TID AC + HS     MD- Please consider the following in-hospital insulin adjustments:  1. Increase Lantus to 30 units QHS  2. Start patient's oral DM meds back (Glipizide and Metformin)     --Will follow patient during hospitalization--  Ambrose Finland RN, MSN, CDE Diabetes Coordinator Inpatient Glycemic Control Team Team Pager: [phone] (8a-5p)

## 2017-07-17 NOTE — Progress Notes (Signed)
Patient ID: Jonathan [name], male   DOB: 1980-12-12, 37 y.o.   MRN: [id number] Pt remain in bed resting with eyes closed. Pt at the time of assessment endorsed moderate depression and anxiety. Pt denied SI/HI, pain or AVH. Pt remained flat and sad looking. Pt does not look to be in any distress at this time. Medications offered as prescribed. All patient's questions and concerns addressed. Support, encouragement, and safe environment provided. 15-minute safety checks continue. Pt was med compliant. Safety checks continue.

## 2017-07-17 NOTE — Progress Notes (Signed)
Adult Psychoeducational Group Note  Date:  07/17/2017 Time:  9:02 AM  Group Topic/Focus:  Orientation:   The focus of this group is to educate the patient on the purpose and policies of crisis stabilization and provide a format to answer questions about their admission.  The group details unit policies and expectations of patients while admitted.  Participation Level:  Minimal  Participation Quality:  Appropriate  Affect:  Appropriate  Cognitive:  Appropriate  Insight: Appropriate  Engagement in Group:  Limited  Modes of Intervention:  Discussion  Additional Comments:  Pt attended group and did not provide any input or have any questions.   Jonathan [name] 07/17/2017, 9:02 AM

## 2017-07-17 NOTE — Progress Notes (Signed)
Pt attended group this evening. 

## 2017-07-18 DIAGNOSIS — Z79899 Other long term (current) drug therapy: Secondary | ICD-10-CM

## 2017-07-18 DIAGNOSIS — E1165 Type 2 diabetes mellitus with hyperglycemia: Secondary | ICD-10-CM

## 2017-07-18 DIAGNOSIS — R739 Hyperglycemia, unspecified: Secondary | ICD-10-CM

## 2017-07-18 DIAGNOSIS — T1491XA Suicide attempt, initial encounter: Secondary | ICD-10-CM

## 2017-07-18 DIAGNOSIS — Z7984 Long term (current) use of oral hypoglycemic drugs: Secondary | ICD-10-CM

## 2017-07-18 DIAGNOSIS — F332 Major depressive disorder, recurrent severe without psychotic features: Principal | ICD-10-CM

## 2017-07-18 LAB — BASIC METABOLIC PANEL
ANION GAP: 9 (ref 5–15)
BUN: 7 mg/dL (ref 6–20)
CALCIUM: 9.2 mg/dL (ref 8.9–10.3)
CHLORIDE: 104 mmol/L (ref 101–111)
CO2: 25 mmol/L (ref 22–32)
Creatinine, Ser: 0.69 mg/dL (ref 0.61–1.24)
GFR calc non Af Amer: >60 mL/min (ref >60)
Glucose, Bld: 244 mg/dL — ABNORMAL HIGH (ref 65–99)
Potassium: 3.5 mmol/L (ref 3.5–5.1)
SODIUM: 138 mmol/L (ref 135–145)

## 2017-07-18 LAB — GLUCOSE, CAPILLARY
GLUCOSE-CAPILLARY: 263 mg/dL — AB (ref 65–99)
GLUCOSE-CAPILLARY: 228 mg/dL — AB (ref 65–99)
GLUCOSE-CAPILLARY: 281 mg/dL — AB (ref 65–99)
GLUCOSE-CAPILLARY: 248 mg/dL — AB (ref 65–99)
Glucose-Capillary: 248 mg/dL — ABNORMAL HIGH (ref 65–99)
Glucose-Capillary: 230 mg/dL — ABNORMAL HIGH (ref 65–99)

## 2017-07-18 MED ORDER — INSULIN GLARGINE 100 UNIT/ML ~~LOC~~ SOLN
45.0000 [IU] | Freq: Every day | SUBCUTANEOUS | Status: DC
  Administered 2017-07-18: 45 [IU] via SUBCUTANEOUS

## 2017-07-18 MED ORDER — METFORMIN HCL 500 MG PO TABS
1000.0000 mg | ORAL_TABLET | Freq: Two times a day (BID) | ORAL | Status: DC
  Administered 2017-07-18 – 2017-07-19 (×2): 1000 mg via ORAL
  Filled 2017-07-18 (×5): qty 2

## 2017-07-18 NOTE — Progress Notes (Signed)
[facility] MD Progress Note  07/18/2017 12:27 PM Jonathan [name]  MRN:  [id number] Subjective: Patient is seen and examined.  Patient is a 37 year old male with essentially a negative past psychiatric history who attempted suicide by overdose of ibuprofen, glipizide and metformin.  The patient stated that this was an impulsive event.  He was been under significant amount of stress.  He stated it was not suicidal, but just desperate.  He stated he called his brother shortly afterwards.  He did minimizes neurovegetative symptoms of depression, but did admit he had been under significant stress.  He had bought some cell phones as an investment to resell, but they had a lock on them which made them essentially useless.  He has his own Internet business.  His father lives in the area for social support, but some of his family members still live in [place], and he has friends in other states.  He was seen by the medicine consultation team this morning, and adjusted his diabetic medications.  His blood sugars have been high because of the fact of no metformin or glipizide. Principal Problem: <principal problem not specified> Diagnosis:   Patient Active Problem List   Diagnosis Date Noted  . Major depressive disorder, recurrent severe without psychotic features ([place]) [F33.2] 07/16/2017  . OD (overdose of drug), intentional self-harm, initial encounter ([place]) [T50.902A] 07/15/2017  . Cellulitis [L03.90] 12/26/2016  . Open toe wound, subsequent encounter [S91.109D] 12/12/2016  . Hyperglycemia [R73.9] 12/06/2016  . Diabetic ulcer of left foot ([employer]) [D78.242, L97.529] 12/05/2016  . Diabetic neuropathy ([place]) [E11.40] 10/16/2015  . Erectile dysfunction [N52.9] 08/05/2015  . Low testosterone [R79.89] 09/12/2014  . DM type 2, uncontrolled, with neuropathy ([place]) [E11.40, E11.65] 06/12/2014  . Diabetes mellitus, type II ([place]) [E11.9] 03/23/2012  . Noncompliance [Z91.19] 03/23/2012  . Gastroenteritis and colitis, viral  [A08.4] 03/23/2012   Total Time spent with patient: 30 minutes  Past Psychiatric History: See admission H&P  Past Medical History:  Past Medical History:  Diagnosis Date  . Diabetes mellitus    History reviewed. No pertinent surgical history. Family History:  Family History  Family history unknown: Yes   Family Psychiatric  History: See admission H&P Social History:  Social History   Substance and Sexual Activity  Alcohol Use Yes  . Alcohol/week: 1.2 oz  . Types: 1 Glasses of wine, 1 Cans of beer per week   Comment: occassional     Social History   Substance and Sexual Activity  Drug Use No    Social History   Socioeconomic History  . Marital status: Single    Spouse name: Not on file  . Number of children: Not on file  . Years of education: Not on file  . Highest education level: Not on file  Occupational History  . Not on file  Social Needs  . Financial resource strain: Not on file  . Food insecurity:    Worry: Not on file    Inability: Not on file  . Transportation needs:    Medical: Not on file    Non-medical: Not on file  Tobacco Use  . Smoking status: Never Smoker  . Smokeless tobacco: Never Used  Substance and Sexual Activity  . Alcohol use: Yes    Alcohol/week: 1.2 oz    Types: 1 Glasses of wine, 1 Cans of beer per week    Comment: occassional  . Drug use: No  . Sexual activity: Not on file  Lifestyle  . Physical activity:  Days per week: Not on file    Minutes per session: Not on file  . Stress: Not on file  Relationships  . Social connections:    Talks on phone: Not on file    Gets together: Not on file    Attends religious service: Not on file    Active member of club or organization: Not on file    Attends meetings of clubs or organizations: Not on file    Relationship status: Not on file  Other Topics Concern  . Not on file  Social History Narrative  . Not on file   Additional Social History:                          Sleep: Fair  Appetite:  Fair  Current Medications: Current Facility-Administered Medications  Medication Dose Route Frequency Provider Last Rate Last Dose  . alum & mag hydroxide-simeth (MAALOX/MYLANTA) 200-200-20 MG/5ML suspension 30 mL  30 mL Oral Q4H PRN [name], NP      . hydrOXYzine (ATARAX/VISTARIL) tablet 25 mg  25 mg Oral Q6H PRN Cobos, Fernando A, MD      . insulin aspart (novoLOG) injection 0-15 Units  0-15 Units Subcutaneous TID WC [name], NP   5 Units at 07/18/17 [phone]  . insulin aspart (novoLOG) injection 0-5 Units  0-5 Units Subcutaneous QHS [name], NP   2 Units at 07/17/17 2116  . insulin aspart (novoLOG) injection 10 Units  10 Units Subcutaneous TID WC Purohit, Konrad Dolores, MD   10 Units at 07/18/17 [phone]  . insulin glargine (LANTUS) injection 45 Units  45 Units Subcutaneous QHS Sharma Covert, MD      . magnesium hydroxide (MILK OF MAGNESIA) suspension 30 mL  30 mL Oral Daily PRN [name], NP      . metFORMIN (GLUCOPHAGE) tablet 1,000 mg  1,000 mg Oral BID WC Sharma Covert, MD      . traZODone (DESYREL) tablet 50 mg  50 mg Oral QHS PRN Cobos, Myer Peer, MD        Lab Results:  Results for orders placed or performed during the hospital encounter of 07/16/17 (from the past 48 hour(s))  Glucose, capillary     Status: Abnormal   Collection Time: 07/16/17 10:11 PM  Result Value Ref Range   Glucose-Capillary 302 (H) 65 - 99 mg/dL  Glucose, capillary     Status: Abnormal   Collection Time: 07/17/17  6:05 AM  Result Value Ref Range   Glucose-Capillary 232 (H) 65 - 99 mg/dL   Comment 1 Notify RN    Comment 2 Document in Chart   Glucose, capillary     Status: Abnormal   Collection Time: 07/17/17  1:00 PM  Result Value Ref Range   Glucose-Capillary 351 (H) 65 - 99 mg/dL   Comment 1 Notify RN    Comment 2 Document in Chart   Glucose, capillary     Status: Abnormal   Collection Time: 07/17/17  5:17 PM  Result Value Ref Range    Glucose-Capillary 322 (H) 65 - 99 mg/dL   Comment 1 Notify RN    Comment 2 Document in Chart   Glucose, capillary     Status: Abnormal   Collection Time: 07/17/17  8:39 PM  Result Value Ref Range   Glucose-Capillary 228 (H) 65 - 99 mg/dL   Comment 1 Notify RN    Comment 2 Document in Chart  Glucose, capillary     Status: Abnormal   Collection Time: 07/18/17  5:44 AM  Result Value Ref Range   Glucose-Capillary 228 (H) 65 - 99 mg/dL   Comment 1 Notify RN    Comment 2 Document in Chart   Basic metabolic panel     Status: Abnormal   Collection Time: 07/18/17  6:28 AM  Result Value Ref Range   Sodium 138 135 - 145 mmol/L   Potassium 3.5 3.5 - 5.1 mmol/L   Chloride 104 101 - 111 mmol/L   CO2 25 22 - 32 mmol/L   Glucose, Bld 244 (H) 65 - 99 mg/dL   BUN 7 6 - 20 mg/dL   Creatinine, Ser 0.69 0.61 - 1.24 mg/dL   Calcium 9.2 8.9 - 10.3 mg/dL   GFR calc non Af Amer >60 >60 mL/min   GFR calc Af Amer >60 >60 mL/min    Comment: (NOTE) The eGFR has been calculated using the CKD EPI equation. This calculation has not been validated in all clinical situations. eGFR's persistently <60 mL/min signify possible Chronic Kidney Disease.    Anion gap 9 5 - 15    Comment: Performed at [facility], [place] [address], [place], [place] [address]    Blood Alcohol level:  Lab Results  Component Value Date   ETH <10 07/14/2017   ETH  04/22/2008    <5        LOWEST DETECTABLE LIMIT FOR SERUM ALCOHOL IS 5 mg/dL FOR MEDICAL PURPOSES ONLY    Metabolic Disorder Labs: Lab Results  Component Value Date   HGBA1C >15.5 (H) 07/15/2017   MPG >398 07/15/2017   MPG 291.96 12/06/2016   No results found for: PROLACTIN Lab Results  Component Value Date   CHOL 141 08/06/2015   TRIG 109 08/06/2015   HDL 47 08/06/2015   CHOLHDL 3.0 08/06/2015   VLDL 22 08/06/2015   LDLCALC 72 08/06/2015   LDLCALC 57 06/12/2014    Physical Findings: AIMS: Facial and Oral Movements Muscles of  Facial Expression: None, normal Lips and Perioral Area: None, normal Jaw: None, normal Tongue: None, normal,Extremity Movements Upper (arms, wrists, hands, fingers): None, normal Lower (legs, knees, ankles, toes): None, normal, Trunk Movements Neck, shoulders, hips: None, normal, Overall Severity Severity of abnormal movements (highest score from questions above): None, normal Incapacitation due to abnormal movements: None, normal Patient's awareness of abnormal movements (rate only patient's report): No Awareness, Dental Status Current problems with teeth and/or dentures?: No Does patient usually wear dentures?: No  CIWA:  CIWA-Ar Total: 0 COWS:  COWS Total Score: 0  Musculoskeletal: Strength & Muscle Tone: within normal limits Gait & Station: normal Patient leans: N/A  Psychiatric Specialty Exam: Physical Exam  Respiratory: He has wheezes.    ROS  Blood pressure 127/75, pulse (!) 112, temperature 97.6 F (36.4 C), temperature source Oral, resp. rate 18, height _0  (2.032 m), weight 112 kg (247 lb), SpO2 100 %.Body mass index is 27.13 kg/m.  General Appearance: Casual  Eye Contact:  Fair  Speech:  Normal Rate  Volume:  Normal  Mood:  Anxious  Affect:  Appropriate  Thought Process:  Coherent  Orientation:  Full (Time, Place, and Person)  Thought Content:  Logical  Suicidal Thoughts:  No  Homicidal Thoughts:  No  Memory:  Immediate;   Fair  Judgement:  Intact  Insight:  Fair  Psychomotor Activity:  Increased  Concentration:  Concentration: Fair  Recall:  [employer] of Knowledge:  Good  Language:  Fair  Akathisia:  Negative  Handed:  Right  AIMS (if indicated):     Assets:  Communication Skills Desire for Improvement Housing Physical Health Resilience  ADL's:  Intact  Cognition:  WNL  Sleep:  Number of Hours: 6.75     Treatment Plan Summary: Daily contact with patient to assess and evaluate symptoms and progress in treatment, Medication management and  Plan Patient is seen and examined.  Patient is a 37 year old male with the above-stated past psychiatric history seen in follow-up.  I believe that this was an impulsive event.  He does not endorse a great deal of depressive symptoms.  He is anxious to get back to his business, mainly because the fact of the financial stressors.  We discussed quite openly my fears and concerns about him and the possibility for self-harm.  He will allow me to talk to his father.  We will also attempt to get his cell phone so he can fulfill his obligations of a job interview today.  I think by not allowing him to have the phone will make things much worse for him.  I told him that if I can get enough collateral information to feel comfortable to discharge him that we would be able to discharge him tomorrow.  In the meantime I do not think he needs any antidepressant medication.  We will hold off on that.  Medicine consultation recommended some changes to his insulin, and I am going to go on and increase his Lantus to 40 units subcu nightly.  I am also going to restart his metformin.  They made some other recommendations, and I will wait and let his outpatient doctor take care of that.  Sharma Covert, MD 07/18/2017, 12:27 PM

## 2017-07-18 NOTE — Progress Notes (Signed)
D: Pt was in the dayroom upon initial approach.  He presents with depressed affect and mood.  Initially, he forwards little information to Clinical research associate.  He describes his day as "good."  Pt reports his goal is to "get out of here."  He reports he is discharging tomorrow and he feels safe to do so.  Denies SI/HI, hallucinations, and pain.  Pt has been visible in milieu and he attended evening group.  A: Introduced self to pt.  Actively listened to pt and offered support and encouragement.  Medication administered per order.  Q15 minute safety checks maintained.  R: Pt is compliant with medication.  He verbally contracts for safety and reports he will inform staff of needs and concerns.  Will continue to monitor and assess.

## 2017-07-18 NOTE — BHH Counselor (Signed)
Adult Comprehensive Assessment  Patient ID: Jonathan [name], male   DOB: 1980/12/12, 37 y.o.   MRN: [id number]  Information Source: Information source: Patient  Current Stressors:  Employment / Job issues: Pt works for [employer] but hours have not been regular. Family Relationships: Pt had an argument with his brother prior to taking the overdose. Financial / Lack of resources (include bankruptcy): Financial stress--unable to pay his rent. Housing / Lack of housing: Pt recently lost his apartment and is now staying with his brother.  Living/Environment/Situation:  Living Arrangements: Other relatives(brother) Living conditions (as described by patient or guardian): mostly goes OK but they did have an arguement prior to pt admission How long has patient lived in current situation?: 3 months What is atmosphere in current home: Comfortable  Family History:  Marital status: Single Are you sexually active?: Yes What is your sexual orientation?: heterosexual Has your sexual activity been affected by drugs, alcohol, medication, or emotional stress?: no Does patient have children?: No  Childhood History:  By whom was/is the patient raised?: Grandparents, Other (Comment)(aunt) Additional childhood history information: Pt is from [place], came to [place] in 2005.  Pt was raised by grandmother and aunt: father came to [place] in 22.  Parents divorced.  Father made plans for his relatives to raise pt.  Good childhood. Description of patient's relationship with caregiver when they were a child: mom: still had good relationship despite not living with her. dad: came back and visited every 2-3 years when pt was young Patient's description of current relationship with people who raised him/her: mom: still in [place]: phone contact regular, good relationship.  dad: good relationship How were you disciplined when you got in trouble as a child/adolescent?: appropriate dscipline Does patient have siblings?:  Yes Number of Siblings: 6 Description of patient's current relationship with siblings: 5 brothers, 1 sister.  3 are in the [place].  Good relationships. Did patient suffer any verbal/emotional/physical/sexual abuse as a child?: No Did patient suffer from severe childhood neglect?: No Has patient ever been sexually abused/assaulted/raped as an adolescent or adult?: No Was the patient ever a victim of a crime or a disaster?: No Witnessed domestic violence?: No Has patient been effected by domestic violence as an adult?: No  Education:  Highest grade of school patient has completed: one year at [employer] Currently a Consulting civil engineer?: Yes Name of school: [employer] How long has the patient attended?: almost 2 years Learning disability?: No  Employment/Work Situation:   Employment situation: Employed Where is patient currently employed?: [employer] and [employer] How long has patient been employed?: 3 years Patient's job has been impacted by current illness: No What is the longest time patient has a held a job?: 4 years Where was the patient employed at that time?: [employer] Has patient ever been in the [employer]?: No Are There Guns or Other Weapons in Your Home?: No  Financial Resources:   Financial resources: Income from employment(support from family) Does patient have a representative payee or guardian?: No  Alcohol/Substance Abuse:   What has been your use of drugs/alcohol within the last 12 months?: alcohol: "once a year."  denies drugs If attempted suicide, did drugs/alcohol play a role in this?: No Alcohol/Substance Abuse Treatment Hx: Denies past history Has alcohol/substance abuse ever caused legal problems?: No  Social Support System:   Patient's Community Support System: Good Describe Community Support System: father, brother, extended family Type of faith/religion: none How does patient's faith help to cope with current illness?: na  Leisure/Recreation:  Leisure and Hobbies:  soccer  Strengths/Needs:   What things does the patient do well?: helping people, social In what areas does patient struggle / problems for patient: need better paying job  Discharge Plan:   Does patient have access to transportation?: Yes Will patient be returning to same living situation after discharge?: No Plan for living situation after discharge: pt is going to be staying with his father Currently receiving community mental health services: No If no, would patient like referral for services when discharged?: Yes (What county?)([place]) Does patient have financial barriers related to discharge medications?: Yes Patient description of barriers related to discharge medications: no insurance  Summary/Recommendations:   Summary and Recommendations (to be completed by the evaluator): Pt is 37 year old male from [place].  Pt is diagnosed with major depressive disorder and was admitted due to an intentional overdose.  Pt reports several stressors that hit him "all at once": he lost his apartment due to financial stress, not enought work hours at his job, and an argument with his brother.  Recommendations for pt include crisis stabilzation, therapeutic milieu, attend and paraticipate in groups, medication management, and development of comprehensive mental wellness plan.  Lorri Frederick. 07/18/2017

## 2017-07-18 NOTE — Progress Notes (Addendum)
Inpatient Diabetes Program Recommendations  AACE/ADA: New Consensus Statement on Inpatient Glycemic Control (2015)  Target Ranges:  Prepandial:   less than 140 mg/dL      Peak postprandial:   less than 180 mg/dL (1-2 hours)      Critically ill patients:  140 - 180 mg/dL   Results for Jonathan [name], Jonathan [name] (MRN [id number]) as of 07/18/2017 13:10  Ref. Range 07/17/2017 06:05 07/17/2017 13:00 07/17/2017 17:17 07/17/2017 20:39  Glucose-Capillary Latest Ref Range: 65 - 99 mg/dL 433 (H) 295 (H) 188 (H) 228 (H)   Results for Jonathan [name], Jonathan [name] (MRN [id number]) as of 07/18/2017 13:10  Ref. Range 07/18/2017 05:44 07/18/2017 12:27  Glucose-Capillary Latest Ref Range: 65 - 99 mg/dL 601 (H) 093 (H)   Results for Jonathan [name], Jonathan [name] (MRN [id number]) as of 07/18/2017 13:10  Ref. Range 07/15/2017 12:45  Hemoglobin A1C Latest Ref Range: 4.8 - 5.6 % >15.5 (H)    Home DM Meds: Basaglar 30 units QHS                              Glipizide 10 mg daily                             Metformin 1000 mg BID  Current Orders: Lantus 45 units QHS                             Novolog Moderate Correction Scale/ SSI (0-15 units) TID AC + HS      Novolog 10 units TID with meals (hold if pt eats <50% of meal)      Metformin 1000 mg BID       MD- Note that Lantus increased last PM.  Also note that Novolog 10 units TID with meals started yesterday at 5pm as well.  Note that Dr. Clearnce Sorrel made recommendations yesterday afternoon for insulin adjustments.  Given that pt's A1c is so highly elevated, it looks like patient would benefit greatly from the addition of Novolog to his home regimen.  At time of discharge, recommend sending patient home with Rx for Novolog to be taken with meals  Please also consider increasing Novolog Meal Coverage to: Novolog 15 units TID with meals      --Will follow patient during hospitalization--  Ambrose Finland RN, MSN, CDE Diabetes Coordinator Inpatient Glycemic Control Team Team Pager:  [phone] (8a-5p)

## 2017-07-18 NOTE — Plan of Care (Signed)
D: Patient was anxious but pleasant upon approach this morning. Patient reports he feels "fine" and currently denies SI/HI/AVH. Patient seemed as if he was not interested in talking to Clinical research associate. Said his goal today was to "just get out of here."  A: Patient compliant with medication prescribed per MD.  R: Patient safety maintained with 15 minute checks. Support and encouragement provided.  Problem: Activity: Goal: Interest or engagement in activities will improve Outcome: Progressing  Patient is in the day room communicating with other patients.

## 2017-07-18 NOTE — [facility] Group Notes (Signed)
LCSW Group Therapy Note 07/18/2017 2:32 PM  Type of Therapy/Topic: Group Therapy: Feelings about Diagnosis  Participation Level: Did Not Attend   Description of Group:  This group will allow patients to explore their thoughts and feelings about diagnoses they have received. Patients will be guided to explore their level of understanding and acceptance of these diagnoses. Facilitator will encourage patients to process their thoughts and feelings about the reactions of others to their diagnosis and will guide patients in identifying ways to discuss their diagnosis with significant others in their lives. This group will be process-oriented, with patients participating in exploration of their own experiences, giving and receiving support, and processing challenge from other group members.  Therapeutic Goals: 1. Patient will demonstrate understanding of diagnosis as evidenced by identifying two or more symptoms of the disorder 2. Patient will be able to express two feelings regarding the diagnosis 3. Patient will demonstrate their ability to communicate their needs through discussion and/or role play  Summary of Patient Progress:  Invited, chose not to attend.     Therapeutic Modalities:  Cognitive Behavioral Therapy Brief Therapy Feelings Identification    Kamel Haven LCSWA Clinical Social Worker   

## 2017-07-18 NOTE — Progress Notes (Signed)
Patient ID: Jonathan [name], male   DOB: [birth year], 37 y.o.   MRN: [id number]  D: Patient was pleasant on approach tonight. Reports that his mood is improved and currently denies any SI, HI, or a/v hallucinations. Patient interacts with others on and off but keeping to self mostly this shift. Complaint with monitoring diabetes and taking insulin.  A: Staff will monitor on q 15 minute checks, follow treatment plan, and give medications as ordered. R: Cooperative on the unit.

## 2017-07-18 NOTE — [facility] Suicide Risk Assessment (Signed)
[facility] INPATIENT:  Family/Significant Other Suicide Prevention Education  Suicide Prevention Education:  Education Completed;Al Pimple, brother, [phone],   has been identified by the patient as the family member/significant other with whom the patient will be residing, and identified as the person(s) who will aid the patient in the event of a mental health crisis (suicidal ideations/suicide attempt).  With written consent from the patient, the family member/significant other has been provided the following suicide prevention education, prior to the and/or following the discharge of the patient.  The suicide prevention education provided includes the following:  Suicide risk factors  Suicide prevention and interventions  National Suicide Hotline telephone number  [facility] assessment telephone number  [facility] Emergency Assistance 911  [facility] and/or Residential Mobile Crisis Unit telephone number  Request made of family/significant other to:  Remove weapons (e.g., guns, rifles, knives), all items previously/currently identified as safety concern.  No guns, per [place].  Remove drugs/medications (over-the-counter, prescriptions, illicit drugs), all items previously/currently identified as a safety concern.  The family member/significant other verbalizes understanding of the suicide prevention education information provided.  The family member/significant other agrees to remove the items of safety concern listed above.  [place] said pt lost his apartment due to financial issues.  He was supposed to find another one and move out by May 15 and when that time came, he did not have one.  They argued some prior to the overdose.  Pt may be going to live with his father after discharge from [facility].  Lorri Frederick, LCSW 07/18/2017, 2:15 PM

## 2017-07-18 NOTE — Consult Note (Signed)
Medical Consultation   Jonathan [name]  RUE:[id number]  DOB: 1980/12/12  DOA: 07/16/2017  PCP: Fleet Contras, MD    Outpatient Specialists: none    Requesting physician: Dr. Jama Flavors, Psychiatry  Reason for consultation: hyperglycemia   History of Present Illness: Jonathan [name] is an 37 y.o. male depression, type 2 diabetes on insulin, diabetic neuropathy who presented to the ED on 07/14/2017 with overdose of 50 ibuprofen, 10 metformin, 20 glipizide which was reportedly impulsive.  He was hospitalized at [facility] on 07/17/2017.  Since his hospitalization his blood sugars been running between the 200s 300s.  His A1c was greater than 15.5.  He reports that he injects insulin into his abdomen.  He reports that he rotates sites between his abdomen and right upper extremity.  He reports that he does have a fridge to refrigerate insulin.  He does not report any difficulty affording the insulin.  He checks his blood sugars at home in the mornings and in the evenings occasionally.  He reports that he normally runs between the 200s to 300s.  He reports this is largely because he is poorly compliant with the diet however he is quite active.  He denies any lows or symptomatic hypoglycemia.  He does report some polyuria but denies any polydipsia, cough, congestion, fevers, nausea, vomiting, diarrhea.  He does report some weight gain over the last several months.  Currently he reports his mood is improved.  He denies any SI or HI at this time.  He does report some numbness and tingling in a stocking glove distribution of his lower extremities as well as some erectile dysfunction.  Review of Systems:  ROS As per HPI otherwise 10 point review of systems negative.    Past Medical History: Past Medical History:  Diagnosis Date  . Diabetes mellitus     Past Surgical History: History reviewed. No pertinent surgical history.   Allergies:  No Known Allergies   Social History:  reports  that he has never smoked. He has never used smokeless tobacco. He reports that he drinks about 1.2 oz of alcohol per week. He reports that he does not use drugs.   Family History: Family History  Family history unknown: Yes     Physical Exam: Vitals:   07/17/17 0618 07/17/17 0619 07/18/17 0632 07/18/17 0633  BP: 121/88 120/90 129/87 127/75  Pulse: 80 (!) 121 76 (!) 112  Resp: 18  18   Temp: 98 F (36.7 C)  97.6 F (36.4 C)   TempSrc: Oral  Oral   SpO2:      Weight:      Height:        Constitutional: Well-appearing, no acute distress Eyes: Anicteric sclera ENMT: Moist mucous membranes, good dentition Neck: Supple CVS: Regular rate and rhythm, no murmurs Respiratory:  clear to auscultation bilaterally, no wheezing, rales or rhonchi. Respiratory effort normal. No accessory muscle use.  Abdomen: Soft, nontender, plus bowel sounds, no rebound or guarding Musculoskeletal: : No lower extremity edema Neuro: Diminished sensation in lower extremities Psych: judgement and insight appear normal, stable mood and affect, mental status Skin: Chronic skin changes in lower extremities, no injection granulomas   Data reviewed:  I have personally reviewed following labs and imaging studies Labs:  CBC: Recent Labs  Lab 07/14/17 2347 07/16/17 1347  WBC 4.8 4.6  HGB 14.3 13.7  HCT 44.7 42.0  MCV 79.1 77.9*  PLT 138* 130*  Basic Metabolic Panel: Recent Labs  Lab 07/14/17 2347 07/15/17 0646 07/15/17 0812  NA 140 140  --   K 4.0 3.9  --   CL 102 106  --   CO2 25 24  --   GLUCOSE 423* 356* 429*  BUN 10 14  --   CREATININE 1.09 0.98  --   CALCIUM 9.5 8.5*  --    GFR Estimated Creatinine Clearance: 142.9 mL/min (by C-G formula based on SCr of 0.98 mg/dL). Liver Function Tests: Recent Labs  Lab 07/14/17 2347  AST 23  ALT 24  ALKPHOS 102  BILITOT 0.5  PROT 7.3  ALBUMIN 4.0   No results for input(s): LIPASE, AMYLASE in the last 168 hours. No results for input(s):  AMMONIA in the last 168 hours. Coagulation profile No results for input(s): INR, PROTIME in the last 168 hours.  Cardiac Enzymes: No results for input(s): CKTOTAL, CKMB, CKMBINDEX, TROPONINI in the last 168 hours. BNP: Invalid input(s): POCBNP CBG: Recent Labs  Lab 07/17/17 0605 07/17/17 1300 07/17/17 1717 07/17/17 2039 07/18/17 0544  GLUCAP 232* 351* 322* 228* 228*   D-Dimer No results for input(s): DDIMER in the last 72 hours. Hgb A1c Recent Labs    07/15/17 1245  HGBA1C >15.5*   Lipid Profile No results for input(s): CHOL, HDL, LDLCALC, TRIG, CHOLHDL, LDLDIRECT in the last 72 hours. Thyroid function studies No results for input(s): TSH, T4TOTAL, T3FREE, THYROIDAB in the last 72 hours.  Invalid input(s): FREET3 Anemia work up No results for input(s): VITAMINB12, FOLATE, FERRITIN, TIBC, IRON, RETICCTPCT in the last 72 hours. Urinalysis    Component Value Date/Time   COLORURINE YELLOW 12/05/2016 2123   APPEARANCEUR CLEAR 12/05/2016 2123   LABSPEC 1.028 12/05/2016 2123   PHURINE 5.0 12/05/2016 2123   GLUCOSEU >=500 (A) 12/05/2016 2123   HGBUR NEGATIVE 12/05/2016 2123   BILIRUBINUR NEGATIVE 12/05/2016 2123   BILIRUBINUR negative 08/05/2015 1010   KETONESUR 5 (A) 12/05/2016 2123   PROTEINUR NEGATIVE 12/05/2016 2123   UROBILINOGEN 0.2 08/05/2015 1010   UROBILINOGEN 0.2 01/06/2014 0118   NITRITE NEGATIVE 12/05/2016 2123   LEUKOCYTESUR NEGATIVE 12/05/2016 2123     Sepsis Labs Invalid input(s): PROCALCITONIN,  WBC,  LACTICIDVEN Microbiology No results found for this or any previous visit (from the past 240 hour(s)).     Inpatient Medications:   Scheduled Meds: . insulin aspart  0-15 Units Subcutaneous TID WC  . insulin aspart  0-5 Units Subcutaneous QHS  . insulin aspart  10 Units Subcutaneous TID WC  . insulin glargine  35 Units Subcutaneous QHS   Continuous Infusions:   Radiological Exams on Admission: No results  found.  Impression/Recommendations Active Problems:   Major depressive disorder, recurrent severe without psychotic features (HCC)  #) Uncontrolled type 2 diabetes: At this time it does not appear that there is a secondary cause for his poorly controlled diabetes.  He does not have any evidence of hypercortisolism or an abnormal TSH.  He does not have any evidence of injection granulomas and he is appropriately rotating sites.  His blood sugars are universally high including first thing in the morning and after meals.  Suspect that he needs in general increased insulin and likely mealtime insulin.  It seems that his mood disorder is playing into his noncompliance with his medication.  He also at this time does not appear to be in DKA. -Continue glargine 35 units nightly -Start aspart 10 units 3 times daily with meals -Increase to moderate sliding scale - Consider  starting moderate intensity statin - Restart home metformin thousand milligrams twice daily -Consider changing from home glipizide to DPP 4 antagonist  #) Psychiatric disorder: We will defer further management to psych   Thank you for this consultation.  Please reconsult if any further medical issues return..   Time Spent: 35  Delaine Lame M.D. [facility] 07/18/2017, 7:14 AM

## 2017-07-19 LAB — GLUCOSE, CAPILLARY: GLUCOSE-CAPILLARY: 228 mg/dL — AB (ref 65–99)

## 2017-07-19 MED ORDER — HYDROXYZINE HCL 25 MG PO TABS: 25.0000 mg | ORAL_TABLET | Freq: Four times a day (QID) | ORAL | 0 refills | Status: DC | PRN

## 2017-07-19 MED ORDER — TRAZODONE HCL 50 MG PO TABS: 50.0000 mg | ORAL_TABLET | Freq: Every evening | ORAL | 0 refills | Status: DC | PRN

## 2017-07-19 NOTE — Discharge Summary (Signed)
Physician Discharge Summary Note  Patient:  Jonathan [name] is an 37 y.o., male MRN:  [id number] DOB:  04/15/1980 Patient phone:  [phone] (home)  Patient address:   [place] [address],  Total Time spent with patient: 45 minutes  Date of Admission:  07/16/2017 Date of Discharge: 07/19/2017  Reason for Admission:  Suicide attempt  Principal Problem: Major depressive disorder, recurrent severe without psychotic features [facility]) Discharge Diagnoses: Patient Active Problem List   Diagnosis Date Noted  . Major depressive disorder, recurrent severe without psychotic features ([place]) [F33.2] 07/16/2017    Priority: High  . OD (overdose of drug), intentional self-harm, initial encounter ([place]) [T50.902A] 07/15/2017  . Cellulitis [L03.90] 12/26/2016  . Open toe wound, subsequent encounter [S91.109D] 12/12/2016  . Hyperglycemia [R73.9] 12/06/2016  . Diabetic ulcer of left foot ([place]) [N47.096, L97.529] 12/05/2016  . Diabetic neuropathy ([place]) [E11.40] 10/16/2015  . Erectile dysfunction [N52.9] 08/05/2015  . Low testosterone [R79.89] 09/12/2014  . DM type 2, uncontrolled, with neuropathy ([place]) [E11.40, E11.65] 06/12/2014  . Diabetes mellitus, type II ([place]) [E11.9] 03/23/2012  . Noncompliance [Z91.19] 03/23/2012  . Gastroenteritis and colitis, viral [A08.4] 03/23/2012    Past Psychiatric History: depression  Past Medical History:  Past Medical History:  Diagnosis Date  . Diabetes mellitus    History reviewed. No pertinent surgical history. Family History:  Family History  Family history unknown: Yes   Family Psychiatric  History: none Social History:  Social History   Substance and Sexual Activity  Alcohol Use Yes  . Alcohol/week: 1.2 oz  . Types: 1 Glasses of wine, 1 Cans of beer per week   Comment: occassional     Social History   Substance and Sexual Activity  Drug Use No    Social History   Socioeconomic History  . Marital status: Single     Spouse name: Not on file  . Number of children: Not on file  . Years of education: Not on file  . Highest education level: Not on file  Occupational History  . Not on file  Social Needs  . Financial resource strain: Not on file  . Food insecurity:    Worry: Not on file    Inability: Not on file  . Transportation needs:    Medical: Not on file    Non-medical: Not on file  Tobacco Use  . Smoking status: Never Smoker  . Smokeless tobacco: Never Used  Substance and Sexual Activity  . Alcohol use: Yes    Alcohol/week: 1.2 oz    Types: 1 Glasses of wine, 1 Cans of beer per week    Comment: occassional  . Drug use: No  . Sexual activity: Not on file  Lifestyle  . Physical activity:    Days per week: Not on file    Minutes per session: Not on file  . Stress: Not on file  Relationships  . Social connections:    Talks on phone: Not on file    Gets together: Not on file    Attends religious service: Not on file    Active member of club or organization: Not on file    Attends meetings of clubs or organizations: Not on file    Relationship status: Not on file  Other Topics Concern  . Not on file  Social History Narrative  . Not on file    Hospital Course:  On admission 07/17/2017:  37 year old single male,employed. Presented to ED voluntarily following an overdose of  ibuprofen, metformin, glipizide. ( 5/17)He reports this was impulsive, unplanned. At this time reports that overdose was not suicidal in intent. States he has been under a lot of stress recently and states that he felt overwhelmed on that day and  wanted some rest, sleep.  *ED notes indicate he took 50 Ibuprofens, 20 Glipizides, 10 Metformins and that he expressed suiicidal intention upon initial evaluation. Currently minimizes  neuro-vegetative symptoms of depression  He states he has been under significant stress recently. States he bought a number of cell phones as investment to resell and that they have some software  lock on them which makes them essentially useless. States " I lost a lot of money". At present states denies depression and reports " I am doing all right", stresses desire to be discharged soon.  Medications:  Refused psychiatric medications, restarted medical medications  07/18/2017:  Patient is seen and examined.  Patient is a 37 year old male with essentially a negative past psychiatric history who attempted suicide by overdose of ibuprofen, glipizide and metformin.  The patient stated that this was an impulsive event.  He was been under significant amount of stress.  He stated it was not suicidal, but just desperate.  He stated he called his brother shortly afterwards.  He did minimizes neurovegetative symptoms of depression, but did admit he had been under significant stress.  He had bought some cell phones as an investment to resell, but they had a lock on them which made them essentially useless.  He has his own Internet business.  His father lives in the area for social support, but some of his family members still live in [place], and he has friends in other states.  He was seen by the medicine consultation team this morning, and adjusted his diabetic medications.  His blood sugars have been high because of the fact of no metformin or glipizide.  Medications:  No changes  07/19/2017: Patient has met maximum benefit of hospitalization. Denies suicidal/homicidal ideations, hallucinations, and withdrawal symptoms. Discharge instructions provided with explanations along with crisis numbers, follow-up appointment, and Rx. Stable for discharge.  Physical Findings: AIMS: Facial and Oral Movements Muscles of Facial Expression: None, normal Lips and Perioral Area: None, normal Jaw: None, normal Tongue: None, normal,Extremity Movements Upper (arms, wrists, hands, fingers): None, normal Lower (legs, knees, ankles, toes): None, normal, Trunk Movements Neck, shoulders, hips: None, normal, Overall  Severity Severity of abnormal movements (highest score from questions above): None, normal Incapacitation due to abnormal movements: None, normal Patient's awareness of abnormal movements (rate only patient's report): No Awareness, Dental Status Current problems with teeth and/or dentures?: No Does patient usually wear dentures?: No  CIWA:  CIWA-Ar Total: 0 COWS:  COWS Total Score: 0  Musculoskeletal: Strength & Muscle Tone: within normal limits Gait & Station: normal Patient leans: N/A  Psychiatric Specialty Exam: Physical Exam  Review of Systems  Constitutional: Negative.   HENT: Negative.   Eyes: Negative.   Respiratory: Negative.   Cardiovascular: Negative.   Gastrointestinal: Negative.   Genitourinary: Negative.   Musculoskeletal: Negative.   Skin: Negative.   Neurological: Negative.  Negative for seizures.  Endo/Heme/Allergies: Negative.   Psychiatric/Behavioral:       Currently denies SI  All other systems reviewed and are negative.   Blood pressure 120/90, pulse (!) 121, temperature 98 F (36.7 C), temperature source Oral, resp. rate 18, height _0  (2.032 m), weight 112 kg (247 lb), SpO2 100 %.Body mass index is 27.13 kg/m.  General Appearance: Well  Groomed  Eye Contact:  Good  Speech:  Normal Rate  Volume:  Decreased  Mood:  denies feeling depressed, states mood is "OK"  Affect:  appropriate, reactive, vaguely anxious   Thought Process:  Linear and Descriptions of Associations: Intact  Orientation:  Other:  fully alert and attentive   Thought Content:  denies hallucinations, no delusions, not internally preoccupied, does not appear internally preoccupied   Suicidal Thoughts:  No denies suicidal or self injurious ideations, denies any homicidal or violent ideations, contracts for safety on unit   Homicidal Thoughts:  No  Memory:  recent and remote grossly intact   Judgement:  Fair  Insight:  Fair  Psychomotor Activity:  Normal  Concentration:   Concentration: Good and Attention Span: Good  Recall:  Good  Fund of Knowledge:  Good  Language:  Good  Akathisia:  Negative  Handed:  Right  AIMS (if indicated):     Assets:  Communication Skills Desire for Improvement Resilience  ADL's:  Intact  Cognition:  WNL  Sleep:  Number of Hours: 5    Have you used any form of tobacco in the last 30 days? (Cigarettes, Smokeless Tobacco, Cigars, and/or Pipes): No  Has this patient used any form of tobacco in the last 30 days? (Cigarettes, Smokeless Tobacco, Cigars, and/or Pipes) Yes, Yes, A prescription for an FDA-approved tobacco cessation medication was offered at discharge and the patient refused  Blood Alcohol level:  Lab Results  Component Value Date   ETH <10 07/14/2017   ETH  04/22/2008    <5        LOWEST DETECTABLE LIMIT FOR SERUM ALCOHOL IS 5 mg/dL FOR MEDICAL PURPOSES ONLY    Metabolic Disorder Labs:  Lab Results  Component Value Date   HGBA1C >15.5 (H) 07/15/2017   MPG >398 07/15/2017   MPG 291.96 12/06/2016   No results found for: PROLACTIN Lab Results  Component Value Date   CHOL 141 08/06/2015   TRIG 109 08/06/2015   HDL 47 08/06/2015   CHOLHDL 3.0 08/06/2015   VLDL 22 08/06/2015   LDLCALC 72 08/06/2015   LDLCALC 57 06/12/2014    See Psychiatric Specialty Exam and Suicide Risk Assessment completed by Attending Physician prior to discharge.  Discharge destination:  Home  Is patient on multiple antipsychotic therapies at discharge:  No   Has Patient had three or more failed trials of antipsychotic monotherapy by history:  No  Recommended Plan for Multiple Antipsychotic Therapies: NA  Discharge Instructions    Diet - low sodium heart healthy   Complete by:  As directed    Discharge instructions   Complete by:  As directed    Follow-up with outpatient provider   Increase activity slowly   Complete by:  As directed      Allergies as of 07/19/2017   No Known Allergies     Medication List     STOP taking these medications   glucose blood test strip Commonly known as:  TRUE METRIX BLOOD GLUCOSE TEST   ibuprofen 200 MG tablet Commonly known as:  ADVIL,MOTRIN   TRUE METRIX METER Devi   TRUEPLUS LANCETS 28G Misc     TAKE these medications     Indication  BASAGLAR KWIKPEN 100 UNIT/ML Sopn Inject 0.3 mLs (30 Units total) into the skin at bedtime.  Indication:  Insulin-Dependent Diabetes   glipiZIDE 10 MG 24 hr tablet Commonly known as:  GLUCOTROL XL Take 1 tablet (10 mg total) by mouth daily with breakfast.  Indication:  Type 2 Diabetes   hydrOXYzine 25 MG tablet Commonly known as:  ATARAX/VISTARIL Take 1 tablet (25 mg total) by mouth every 6 (six) hours as needed for anxiety.  Indication:  Feeling Anxious   metFORMIN 1000 MG tablet Commonly known as:  GLUCOPHAGE Take 1 tablet (1,000 mg total) by mouth 2 (two) times daily.  Indication:  Type 2 Diabetes   traZODone 50 MG tablet Commonly known as:  DESYREL Take 1 tablet (50 mg total) by mouth at bedtime as needed for sleep.  Indication:  Trouble Sleeping      Follow-up Information    [facility] Follow up.   Specialty:  [facility] information: [place] [address] [phone]           Follow-up recommendations:  Activity:  as tolerated Diet:  heart heathy diet  Comments:  Follow-up with appointment above  Signed: Waylan Boga, NP 07/19/2017, 9:04 AM

## 2017-07-19 NOTE — Progress Notes (Signed)
  [facility] Adult Case Management Discharge Plan :  Will you be returning to the same living situation after discharge:  No. Patient reports he is living with his father at discharge.  At discharge, do you have transportation home?: Yes,  patient reports he is going to take an Benedetto Goad to his father house Do you have the ability to pay for your medications: No.  Release of information consent forms completed and in the chart;  Patient's signature needed at discharge.  Patient to Follow up at: Follow-up Information    [facility]. Go on 07/25/2017.   Specialty:  Behavioral Health Why:  Appointment is 07/25/17 at 8:00am. Please be sure to bring your Photo ID, SS card, any proof insurance or income, and any discharge paperworl from the hospital.  Contact information: [address] [place] [address] [phone]           Next level of care provider has access to [facility] Link:yes  Safety Planning and Suicide Prevention discussed: Yes,  with the patient's brother  Have you used any form of tobacco in the last 30 days? (Cigarettes, Smokeless Tobacco, Cigars, and/or Pipes): No  Has patient been referred to the Quitline?: N/A patient is not a smoker  Patient has been referred for addiction treatment: N/A  Maeola Sarah, LCSWA 07/19/2017, 10:12 AM

## 2017-07-19 NOTE — [facility] Suicide Risk Assessment (Signed)
Jonathan [name] - [facility] Discharge Suicide Risk Assessment   Principal Problem: <principal problem not specified> Discharge Diagnoses:  Patient Active Problem List   Diagnosis Date Noted  . Major depressive disorder, recurrent severe without psychotic features (HCC) [F33.2] 07/16/2017  . OD (overdose of drug), intentional self-harm, initial encounter (HCC) [T50.902A] 07/15/2017  . Cellulitis [L03.90] 12/26/2016  . Open toe wound, subsequent encounter [S91.109D] 12/12/2016  . Hyperglycemia [R73.9] 12/06/2016  . Diabetic ulcer of left foot (HCC) [W09.811, L97.529] 12/05/2016  . Diabetic neuropathy (HCC) [E11.40] 10/16/2015  . Erectile dysfunction [N52.9] 08/05/2015  . Low testosterone [R79.89] 09/12/2014  . DM type 2, uncontrolled, with neuropathy (HCC) [E11.40, E11.65] 06/12/2014  . Diabetes mellitus, type II (HCC) [E11.9] 03/23/2012  . Noncompliance [Z91.19] 03/23/2012  . Gastroenteritis and colitis, viral [A08.4] 03/23/2012    Total Time spent with patient: 30 minutes  Musculoskeletal: Strength & Muscle Tone: within normal limits Gait & Station: normal Patient leans: N/A  Psychiatric Specialty Exam: Review of Systems  All other systems reviewed and are negative.   Blood pressure 112/76, pulse (!) 106, temperature 98.3 F (36.8 C), temperature source Oral, resp. rate 18, height  (2.032 m), weight 112 kg (247 lb), SpO2 100 %.Body mass index is 27.13 kg/m.  General Appearance: Casual  Eye Contact::  Good  Speech:  Normal Rate409  Volume:  Normal  Mood:  Anxious  Affect:  Appropriate  Thought Process:  Coherent  Orientation:  Full (Time, Place, and Person)  Thought Content:  Logical  Suicidal Thoughts:  No  Homicidal Thoughts:  No  Memory:  Immediate;   Good  Judgement:  Intact  Insight:  Fair  Psychomotor Activity:  Normal  Concentration:  Good  Recall:  Good  Fund of Knowledge:Good  Language: Good  Akathisia:  Negative  Handed:  Right  AIMS (if indicated):     Assets:   Communication Skills Desire for Improvement Housing Resilience Social Support Talents/Skills  Sleep:  Number of Hours: 5.75  Cognition: WNL  ADL's:  Intact   Mental Status Per Nursing Assessment::   On Admission:  NA  Demographic Factors:  Male  Loss Factors: NA  Historical Factors: Impulsivity  Risk Reduction Factors:   Sense of responsibility to family, Employed and Positive social support  Continued Clinical Symptoms:  Medical Diagnoses and Treatments/Surgeries  Cognitive Features That Contribute To Risk:  None    Suicide Risk:  Minimal: No identifiable suicidal ideation.  Patients presenting with no risk factors but with morbid ruminations; may be classified as minimal risk based on the severity of the depressive symptoms    Plan Of Care/Follow-up recommendations:  Activity:  ad lib  Antonieta Pert, MD 07/19/2017, 7:30 AM

## 2017-07-19 NOTE — Plan of Care (Signed)
  Problem: Education: Goal: Knowledge of [facility] Education information/materials will improve Outcome: Completed/Met Goal: Emotional status will improve Outcome: Completed/Met Goal: Mental status will improve Outcome: Completed/Met Goal: Verbalization of understanding the information provided will improve Outcome: Completed/Met   Problem: Activity: Goal: Interest or engagement in activities will improve Outcome: Completed/Met Goal: Sleeping patterns will improve Outcome: Completed/Met   Problem: Coping: Goal: Ability to verbalize frustrations and anger appropriately will improve Outcome: Completed/Met Goal: Ability to demonstrate self-control will improve Outcome: Completed/Met   Problem: Health Behavior/Discharge Planning: Goal: Identification of resources available to assist in meeting health care needs will improve Outcome: Completed/Met Goal: Compliance with treatment plan for underlying cause of condition will improve Outcome: Completed/Met   Problem: Physical Regulation: Goal: Ability to maintain clinical measurements within normal limits will improve Outcome: Completed/Met   Problem: Safety: Goal: Periods of time without injury will increase Outcome: Completed/Met   Problem: Education: Goal: Ability to make informed decisions regarding treatment will improve Outcome: Completed/Met   Problem: Coping: Goal: Coping ability will improve Outcome: Completed/Met   Problem: Health Behavior/Discharge Planning: Goal: Identification of resources available to assist in meeting health care needs will improve Outcome: Completed/Met   Problem: Medication: Goal: Compliance with prescribed medication regimen will improve Outcome: Completed/Met   Problem: Self-Concept: Goal: Ability to disclose and discuss suicidal ideas will improve Outcome: Completed/Met Goal: Will verbalize positive feelings about self Outcome: Completed/Met   Problem: Education: Goal:  Utilization of techniques to improve thought processes will improve Outcome: Completed/Met Goal: Knowledge of the prescribed therapeutic regimen will improve Outcome: Completed/Met   Problem: Activity: Goal: Interest or engagement in leisure activities will improve Outcome: Completed/Met Goal: Imbalance in normal sleep/wake cycle will improve Outcome: Completed/Met   Problem: Coping: Goal: Coping ability will improve Outcome: Completed/Met Goal: Will verbalize feelings Outcome: Completed/Met   Problem: Health Behavior/Discharge Planning: Goal: Ability to make decisions will improve Outcome: Completed/Met Goal: Compliance with therapeutic regimen will improve Outcome: Completed/Met   Problem: Role Relationship: Goal: Will demonstrate positive changes in social behaviors and relationships Outcome: Completed/Met   Problem: Safety: Goal: Ability to disclose and discuss suicidal ideas will improve Outcome: Completed/Met Goal: Ability to identify and utilize support systems that promote safety will improve Outcome: Completed/Met   Problem: Self-Concept: Goal: Will verbalize positive feelings about self Outcome: Completed/Met Goal: Level of anxiety will decrease Outcome: Completed/Met

## 2017-07-19 NOTE — Progress Notes (Signed)
Discharge note: Patient discharged home per MD order.  Reviewed AVS/transition record with patient and he indicated understanding.  He received all personal belongings from unit and locker.  He denies any thoughts of self harm.  Patient left ambulatory and had his own transportation here.

## 2017-07-19 NOTE — Plan of Care (Signed)
  Problem: Safety: Goal: Periods of time without injury will increase Outcome: Progressing Note:  Pt has not harmed self or others tonight.  He denies SI/HI and verbally contracts for safety.   

## 2018-01-15 ENCOUNTER — Encounter: Payer: Self-pay | Admitting: Podiatry

## 2018-01-21 NOTE — Progress Notes (Signed)
This encounter was created in error - please disregard.

## 2018-01-30 ENCOUNTER — Inpatient Hospital Stay (HOSPITAL_COMMUNITY)
Admission: EM | Admit: 2018-01-30 | Discharge: 2018-02-03 | DRG: 504 | Disposition: A | Discharge status: Home / Self Care | Payer: Self-pay | Attending: Internal Medicine | Admitting: Internal Medicine

## 2018-01-30 ENCOUNTER — Emergency Department (HOSPITAL_COMMUNITY): Payer: Self-pay

## 2018-01-30 ENCOUNTER — Encounter (HOSPITAL_COMMUNITY): Payer: Self-pay

## 2018-01-30 ENCOUNTER — Other Ambulatory Visit: Payer: Self-pay

## 2018-01-30 DIAGNOSIS — M86179 Other acute osteomyelitis, unspecified ankle and foot: Secondary | ICD-10-CM

## 2018-01-30 DIAGNOSIS — F418 Other specified anxiety disorders: Secondary | ICD-10-CM | POA: Diagnosis present

## 2018-01-30 DIAGNOSIS — M86172 Other acute osteomyelitis, left ankle and foot: Principal | ICD-10-CM | POA: Diagnosis present

## 2018-01-30 DIAGNOSIS — M869 Osteomyelitis, unspecified: Secondary | ICD-10-CM

## 2018-01-30 DIAGNOSIS — L97529 Non-pressure chronic ulcer of other part of left foot with unspecified severity: Secondary | ICD-10-CM | POA: Diagnosis present

## 2018-01-30 DIAGNOSIS — E1169 Type 2 diabetes mellitus with other specified complication: Secondary | ICD-10-CM | POA: Diagnosis present

## 2018-01-30 DIAGNOSIS — E1152 Type 2 diabetes mellitus with diabetic peripheral angiopathy with gangrene: Secondary | ICD-10-CM | POA: Diagnosis present

## 2018-01-30 DIAGNOSIS — E1165 Type 2 diabetes mellitus with hyperglycemia: Secondary | ICD-10-CM | POA: Diagnosis present

## 2018-01-30 DIAGNOSIS — E11621 Type 2 diabetes mellitus with foot ulcer: Secondary | ICD-10-CM | POA: Diagnosis present

## 2018-01-30 DIAGNOSIS — IMO0002 Reserved for concepts with insufficient information to code with codable children: Secondary | ICD-10-CM

## 2018-01-30 DIAGNOSIS — E1140 Type 2 diabetes mellitus with diabetic neuropathy, unspecified: Secondary | ICD-10-CM | POA: Diagnosis present

## 2018-01-30 DIAGNOSIS — E118 Type 2 diabetes mellitus with unspecified complications: Secondary | ICD-10-CM | POA: Diagnosis present

## 2018-01-30 DIAGNOSIS — Z794 Long term (current) use of insulin: Secondary | ICD-10-CM

## 2018-01-30 LAB — BASIC METABOLIC PANEL
ANION GAP: 9 (ref 5–15)
BUN: <5 mg/dL — ABNORMAL LOW (ref 6–20)
CALCIUM: 9.4 mg/dL (ref 8.9–10.3)
CO2: 26 mmol/L (ref 22–32)
CREATININE: 0.8 mg/dL (ref 0.61–1.24)
Chloride: 101 mmol/L (ref 98–111)
GFR calc Af Amer: >60 mL/min (ref >60)
GFR calc non Af Amer: >60 mL/min (ref >60)
GLUCOSE: 314 mg/dL — AB (ref 70–99)
Potassium: 4 mmol/L (ref 3.5–5.1)
Sodium: 136 mmol/L (ref 135–145)

## 2018-01-30 LAB — C-REACTIVE PROTEIN: CRP: <0.8 mg/dL (ref <1.0)

## 2018-01-30 LAB — CBC
HCT: 47.3 % (ref 39.0–52.0)
Hemoglobin: 13.9 g/dL (ref 13.0–17.0)
MCH: 23.8 pg — AB (ref 26.0–34.0)
MCHC: 29.4 g/dL — ABNORMAL LOW (ref 30.0–36.0)
MCV: 80.9 fL (ref 80.0–100.0)
PLATELETS: 192 10*3/uL (ref 150–400)
RBC: 5.85 MIL/uL — AB (ref 4.22–5.81)
RDW: 12.4 % (ref 11.5–15.5)
WBC: 4.4 10*3/uL (ref 4.0–10.5)
nRBC: 0 % (ref 0.0–0.2)

## 2018-01-30 LAB — CBG MONITORING, ED: GLUCOSE-CAPILLARY: 247 mg/dL — AB (ref 70–99)

## 2018-01-30 LAB — HEMOGLOBIN A1C
Hgb A1c MFr Bld: 12 % — ABNORMAL HIGH (ref 4.8–5.6)
Mean Plasma Glucose: 297.7 mg/dL

## 2018-01-30 LAB — SEDIMENTATION RATE: SED RATE: 5 mm/h (ref 0–16)

## 2018-01-30 MED ORDER — HYDROCODONE-ACETAMINOPHEN 5-325 MG PO TABS
1.0000 | ORAL_TABLET | ORAL | Status: DC | PRN
  Administered 2018-01-30 – 2018-02-02 (×3): 1 via ORAL
  Filled 2018-01-30 (×2): qty 1

## 2018-01-30 MED ORDER — INSULIN GLARGINE 100 UNIT/ML ~~LOC~~ SOLN
30.0000 [IU] | Freq: Every day | SUBCUTANEOUS | Status: DC
  Administered 2018-01-30: 30 [IU] via SUBCUTANEOUS
  Filled 2018-01-30: qty 0.3

## 2018-01-30 MED ORDER — SODIUM CHLORIDE 0.9 % IV SOLN: 2.0000 g | Freq: Once | INTRAVENOUS | Status: DC

## 2018-01-30 MED ORDER — ACETAMINOPHEN 325 MG PO TABS: 650.0000 mg | ORAL_TABLET | Freq: Four times a day (QID) | ORAL | Status: DC | PRN

## 2018-01-30 MED ORDER — VANCOMYCIN HCL IN DEXTROSE 1-5 GM/200ML-% IV SOLN: 1000.0000 mg | Freq: Once | INTRAVENOUS | Status: DC

## 2018-01-30 MED ORDER — BISACODYL 10 MG RE SUPP: 10.0000 mg | Freq: Every day | RECTAL | Status: DC | PRN

## 2018-01-30 MED ORDER — INSULIN ASPART 100 UNIT/ML ~~LOC~~ SOLN
0.0000 [IU] | Freq: Every day | SUBCUTANEOUS | Status: DC
  Administered 2018-01-31: 2 [IU] via SUBCUTANEOUS
  Administered 2018-02-02: 3 [IU] via SUBCUTANEOUS

## 2018-01-30 MED ORDER — IOPAMIDOL (ISOVUE-300) INJECTION 61%
100.0000 mL | Freq: Once | INTRAVENOUS | Status: AC | PRN
  Administered 2018-01-30: 100 mL via INTRAVENOUS

## 2018-01-30 MED ORDER — SODIUM CHLORIDE 0.9 % IV SOLN
INTRAVENOUS | Status: DC
  Administered 2018-01-30: 18:00:00 via INTRAVENOUS

## 2018-01-30 MED ORDER — DOCUSATE SODIUM 100 MG PO CAPS: 100.0000 mg | ORAL_CAPSULE | Freq: Two times a day (BID) | ORAL | Status: DC | PRN

## 2018-01-30 MED ORDER — ACETAMINOPHEN 650 MG RE SUPP: 650.0000 mg | Freq: Four times a day (QID) | RECTAL | Status: DC | PRN

## 2018-01-30 MED ORDER — BASAGLAR KWIKPEN 100 UNIT/ML ~~LOC~~ SOPN: 30.0000 [IU] | PEN_INJECTOR | Freq: Every day | SUBCUTANEOUS | Status: DC

## 2018-01-30 MED ORDER — ENOXAPARIN SODIUM 40 MG/0.4ML ~~LOC~~ SOLN
40.0000 mg | SUBCUTANEOUS | Status: DC
  Administered 2018-01-31 – 2018-02-02 (×3): 40 mg via SUBCUTANEOUS
  Filled 2018-01-30 (×4): qty 0.4

## 2018-01-30 MED ORDER — ONDANSETRON HCL 4 MG PO TABS
4.0000 mg | ORAL_TABLET | Freq: Four times a day (QID) | ORAL | Status: DC | PRN
  Administered 2018-01-30: 4 mg via ORAL
  Filled 2018-01-30: qty 1

## 2018-01-30 MED ORDER — HYDROCODONE-ACETAMINOPHEN 5-325 MG PO TABS
1.0000 | ORAL_TABLET | Freq: Once | ORAL | Status: AC
  Administered 2018-01-30: 1 via ORAL
  Filled 2018-01-30: qty 1

## 2018-01-30 MED ORDER — INSULIN ASPART 100 UNIT/ML ~~LOC~~ SOLN
0.0000 [IU] | Freq: Three times a day (TID) | SUBCUTANEOUS | Status: DC
  Administered 2018-01-31: 5 [IU] via SUBCUTANEOUS
  Administered 2018-01-31 (×2): 8 [IU] via SUBCUTANEOUS
  Administered 2018-02-01 – 2018-02-02 (×3): 5 [IU] via SUBCUTANEOUS
  Administered 2018-02-02: 2 [IU] via SUBCUTANEOUS
  Administered 2018-02-02: 5 [IU] via SUBCUTANEOUS
  Administered 2018-02-03: 3 [IU] via SUBCUTANEOUS
  Administered 2018-02-03: 8 [IU] via SUBCUTANEOUS
  Administered 2018-02-03: 5 [IU] via SUBCUTANEOUS

## 2018-01-30 MED ORDER — HYDROXYZINE HCL 25 MG PO TABS: 25.0000 mg | ORAL_TABLET | Freq: Four times a day (QID) | ORAL | Status: DC | PRN

## 2018-01-30 MED ORDER — TRAZODONE HCL 50 MG PO TABS: 50.0000 mg | ORAL_TABLET | Freq: Every evening | ORAL | Status: DC | PRN

## 2018-01-30 MED ORDER — VANCOMYCIN HCL 10 G IV SOLR
2000.0000 mg | Freq: Once | INTRAVENOUS | Status: DC
  Filled 2018-01-30: qty 2000

## 2018-01-30 MED ORDER — ONDANSETRON HCL 4 MG/2ML IJ SOLN: 4.0000 mg | Freq: Four times a day (QID) | INTRAMUSCULAR | Status: DC | PRN

## 2018-01-30 NOTE — ED Notes (Signed)
Turkey sandwich given per pt request. 

## 2018-01-30 NOTE — ED Notes (Addendum)
CBG collected. Result "247." RN, Materials engineerCricket, notified.

## 2018-01-30 NOTE — ED Notes (Signed)
Pt provided basin of soapy water

## 2018-01-30 NOTE — ED Notes (Signed)
Patient transported to CT 

## 2018-01-30 NOTE — ED Notes (Signed)
Pt to get 2 units of Novolog, pyxis doesn't show med ordered in list.  Lantus received from pharmacy, pharmacy notified, new RN aware.

## 2018-01-30 NOTE — ED Provider Notes (Addendum)
[facility] Emergency Department Provider Note MRN:  [id number]  Arrival date & time: 01/30/18     Chief Complaint   Foot Pain   History of Present Illness   Jonathan [name] is a 37 y.o. year-old male with a history of diabetes presenting to the ED with chief complaint of foot pain.  The pain is located in the left great toe.  Has been having increased pain, swelling, redness for the past 4 to 5 days.  Patient explains that he has diabetes, has had cellulitis of this foot in the past.  For his job he walks 8 to 10 miles per day, has a ulcer underneath his big toe that is getting worse.  Increased foul smell recently.  Denies fevers, no nausea or vomiting, no other symptoms.  Review of Systems  A complete 10 system review of systems was obtained and all systems are negative except as noted in the HPI and PMH.   Patient's Health History    Past Medical History:  Diagnosis Date  . Diabetes mellitus     History reviewed. No pertinent surgical history.  Family History  Family history unknown: Yes    Social History   Socioeconomic History  . Marital status: Single    Spouse name: Not on file  . Number of children: Not on file  . Years of education: Not on file  . Highest education level: Not on file  Occupational History  . Not on file  Social Needs  . Financial resource strain: Not on file  . Food insecurity:    Worry: Not on file    Inability: Not on file  . Transportation needs:    Medical: Not on file    Non-medical: Not on file  Tobacco Use  . Smoking status: Never Smoker  . Smokeless tobacco: Never Used  Substance and Sexual Activity  . Alcohol use: Yes    Alcohol/week: 2.0 standard drinks    Types: 1 Glasses of wine, 1 Cans of beer per week    Comment: occassional  . Drug use: No  . Sexual activity: Not on file  Lifestyle  . Physical activity:    Days per week: Not on file    Minutes per session: Not on file  . Stress: Not on file    Relationships  . Social connections:    Talks on phone: Not on file    Gets together: Not on file    Attends religious service: Not on file    Active member of club or organization: Not on file    Attends meetings of clubs or organizations: Not on file    Relationship status: Not on file  . Intimate partner violence:    Fear of current or ex partner: Not on file    Emotionally abused: Not on file    Physically abused: Not on file    Forced sexual activity: Not on file  Other Topics Concern  . Not on file  Social History Narrative  . Not on file     Physical Exam  Vital Signs and Nursing Notes reviewed Vitals:   01/30/18 1207 01/30/18 1930  BP: 129/90 125/87  Pulse: 99 79  Resp: 16 16  Temp: 99 F (37.2 C)   SpO2: 100% 100%    CONSTITUTIONAL: Well-appearing, NAD NEURO:  Alert and oriented x 3, no focal deficits EYES:  eyes equal and reactive ENT/NECK:  no LAD, no JVD CARDIO: Regular rate, well-perfused, normal S1 and S2 PULM:  CTAB no wheezing or rhonchi GI/GU:  normal bowel sounds, non-distended, non-tender MSK/SPINE:  No gross deformities, no edema SKIN: Deep circular ulcer on the plantar surface of the left great toe, surrounding dried purulent discharge, surrounding erythema to the entire digit. PSYCH:  Appropriate speech and behavior  Diagnostic and Interventional Summary    EKG Interpretation  Date/Time:    Ventricular Rate:    PR Interval:    QRS Duration:   QT Interval:    QTC Calculation:   R Axis:     Text Interpretation:        Labs Reviewed  BASIC METABOLIC PANEL - Abnormal; Notable for the following components:      Result Value   Glucose, Bld 314 (*)    BUN <5 (*)    All other components within normal limits  CBC - Abnormal; Notable for the following components:   RBC 5.85 (*)    MCH 23.8 (*)    MCHC 29.4 (*)    All other components within normal limits  HEMOGLOBIN A1C - Abnormal; Notable for the following components:   Hgb A1c MFr  Bld 12.0 (*)    All other components within normal limits  CULTURE, BLOOD (ROUTINE X 2)  CULTURE, BLOOD (ROUTINE X 2)  SEDIMENTATION RATE  C-REACTIVE PROTEIN  HIV ANTIBODY (ROUTINE TESTING W REFLEX)  BASIC METABOLIC PANEL  CBC    CT FOOT LEFT W CONTRAST  Final Result      Medications  hydrOXYzine (ATARAX/VISTARIL) tablet 25 mg (has no administration in time range)  traZODone (DESYREL) tablet 50 mg (has no administration in time range)  BASAGLAR KWIKPEN KwikPen 30 Units (has no administration in time range)  docusate sodium (COLACE) capsule 100 mg (has no administration in time range)  bisacodyl (DULCOLAX) suppository 10 mg (has no administration in time range)  ondansetron (ZOFRAN) tablet 4 mg (has no administration in time range)    Or  ondansetron (ZOFRAN) injection 4 mg (has no administration in time range)  insulin aspart (novoLOG) injection 0-15 Units (has no administration in time range)  insulin aspart (novoLOG) injection 0-5 Units (has no administration in time range)  enoxaparin (LOVENOX) injection 40 mg (has no administration in time range)  0.9 %  sodium chloride infusion (has no administration in time range)  acetaminophen (TYLENOL) tablet 650 mg (has no administration in time range)    Or  acetaminophen (TYLENOL) suppository 650 mg (has no administration in time range)  HYDROcodone-acetaminophen (NORCO/VICODIN) 5-325 MG per tablet 1 tablet (has no administration in time range)  HYDROcodone-acetaminophen (NORCO/VICODIN) 5-325 MG per tablet 1 tablet (1 tablet Oral Given 01/30/18 1319)  iopamidol (ISOVUE-300) 61 % injection 100 mL (100 mLs Intravenous Contrast Given 01/30/18 1428)     Procedures Critical Care Critical Care Documentation Critical care time provided by me (excluding procedures): 37 minutes  Condition necessitating critical care: osteomyelitis, need for urgent or emergent surgery  Components of critical care management: reviewing of prior records,  laboratory and imaging interpretation, frequent re-examination and reassessment of vital signs, administration of IV fluids, discussion with admitting services.    ED Course and Medical Decision Making  I have reviewed the triage vital signs and the nursing notes.  Pertinent labs & imaging results that were available during my care of the patient were reviewed by me and considered in my medical decision making (see below for details).  Concern for diabetic foot infection, possibly osteomyelitis in this 37 year old male.  Otherwise very well-appearing, no systemic symptoms.  Will  CT to exclude osteomyelitis.  Will strongly consider admission given patient's complete lack of follow-up.  CT concerning for osteomyelitis, admitted to hospitalist service for further care and evaluation.  Patient is nontoxic, normal vital signs, antibiotics deferred to hospitalist service and/or tissue culture.  Elmer Sow. Pilar Plate, MD [facility] Emergency Medicine [facility] [email or web address]  Final Clinical Impressions(s) / ED Diagnoses     ICD-10-CM   1. Other acute osteomyelitis of foot, unspecified laterality [facility]) M86.179     ED Discharge Orders    None         Sabas Sous, MD 01/30/18 2002    Sabas Sous, MD 02/13/18 [phone]

## 2018-01-30 NOTE — H&P (Addendum)
History and Physical    Jonathan [name] ZOX:[id number] DOB: March 23, 1980 DOA: 01/30/2018  PCP: Fleet Contras, MD Consultants:  none Patient coming from: home  Chief Complaint: Toe pain  HPI: Jonathan [name] is a 37 y.o. male with medical history significant for DM who presented to the ED today with c/o L great toe pain and swelling. For his job he walks 8-10 miles per day and has noticed that over the past 4-5 days the toe has become more red, swollen, painful, and more recently draining pus. Is foul-smelling. He has had no fever/chills or other systemic symptoms. No N/V.  He was admitted in October 2018 with cellulitis of the same toe.  However at that time x-ray and MRI were not consistent with osteomyelitis.  He received IV vancomycin, cefepime and p.o. Flagyl for 5 days with no improvement.  Podiatry performed wound debridement and patient continued 1 week of doxycycline and Flagyl.   ED Course: Patient was afebrile, hemodynamically stable.  CT showed cellulitis without discrete drainable soft tissue abscess; it was highly suspicious for osteomyelitis involving the plantar cortex of the left great toe distal phalanx.    Review of Systems: As per HPI; otherwise review of systems reviewed and negative.   Ambulatory Status:  Ambulates without assistance  Past Medical History:  Diagnosis Date  . Diabetes mellitus     History reviewed. No pertinent surgical history.  Social History   Socioeconomic History  . Marital status: Single    Spouse name: Not on file  . Number of children: Not on file  . Years of education: Not on file  . Highest education level: Not on file  Occupational History  . Not on file  Social Needs  . Financial resource strain: Not on file  . Food insecurity:    Worry: Not on file    Inability: Not on file  . Transportation needs:    Medical: Not on file    Non-medical: Not on file  Tobacco Use  . Smoking status: Never Smoker  . Smokeless tobacco:  Never Used  Substance and Sexual Activity  . Alcohol use: Yes    Alcohol/week: 2.0 standard drinks    Types: 1 Glasses of wine, 1 Cans of beer per week    Comment: occassional  . Drug use: No  . Sexual activity: Not on file  Lifestyle  . Physical activity:    Days per week: Not on file    Minutes per session: Not on file  . Stress: Not on file  Relationships  . Social connections:    Talks on phone: Not on file    Gets together: Not on file    Attends religious service: Not on file    Active member of club or organization: Not on file    Attends meetings of clubs or organizations: Not on file    Relationship status: Not on file  . Intimate partner violence:    Fear of current or ex partner: Not on file    Emotionally abused: Not on file    Physically abused: Not on file    Forced sexual activity: Not on file  Other Topics Concern  . Not on file  Social History Narrative  . Not on file    No Known Allergies  Family History  Family history unknown: Yes    Prior to Admission medications   Medication Sig Start Date End Date Taking? Authorizing Provider  glipiZIDE (GLUCOTROL XL) 10 MG 24 hr tablet Take 1 tablet (  10 mg total) by mouth daily with breakfast. 03/02/17   Mliss FritzKim, Jennifer J, PharmD  hydrOXYzine (ATARAX/VISTARIL) 25 MG tablet Take 1 tablet (25 mg total) by mouth every 6 (six) hours as needed for anxiety. 07/19/17   Charm RingsLord, Jamison Y, NP  Insulin Glargine (BASAGLAR KWIKPEN) 100 UNIT/ML SOPN Inject 0.3 mLs (30 Units total) into the skin at bedtime. 07/16/17   Hongalgi, Maximino GreenlandAnand D, MD  metFORMIN (GLUCOPHAGE) 1000 MG tablet Take 1 tablet (1,000 mg total) by mouth 2 (two) times daily. 03/02/17   Mliss FritzKim, Jennifer J, PharmD  traZODone (DESYREL) 50 MG tablet Take 1 tablet (50 mg total) by mouth at bedtime as needed for sleep. 07/19/17   Charm RingsLord, Jamison Y, NP    Physical Exam: Vitals:   01/30/18 1207  BP: 129/90  Pulse: 99  Resp: 16  Temp: 99 F (37.2 C)  TempSrc: Oral  SpO2: 100%      . General: Appears somewhat anxious . Eyes:  PERRL, EOMI, normal lids, iris . ENT:  grossly normal hearing, lips & tongue, mmm . Neck:  supple, no lymphadenopathy . Cardiovascular:  nL S1, S2, normal rate, reg rhythm, no murmur. [name]. Respiratory:   CTA bilaterally with no wheezes/rales/rhonchi.  Normal respiratory effort. . Abdomen:  soft, NT, ND, NABS . Back:   grossly normal alignment . Skin:  no rash or lesions seen on limited exam . Musculoskeletal:  grossly normal tone BUE/BLE, good ROM, no bony abnormality or obvious joint deformity . Lower extremities:  No LE edema. L great toe plantar surface with large amount of callous/dead skin surrounding central ulcer with recent purulent drainage, very foul odor. Probes to bone. Sensation and motor function intact. [name]. Psychiatric:  grossly normal mood and affect, speech fluent and appropriate, AOx3 . Neurologic:  CN 2-12 grossly intact, moves all extremities in coordinated fashion, sensation intact, Patellar DTRs 2+ and symmetric    Radiological Exams on Admission: Ct Foot Left W Contrast  Result Date: 01/30/2018 CLINICAL DATA:  Diabetic ulcer involving the plantar aspect of the great toe. EXAM: CT OF THE LOWER LEFT EXTREMITY WITH CONTRAST TECHNIQUE: Multidetector CT imaging of the lower left extremity was performed according to the standard protocol following intravenous contrast administration. COMPARISON:  MRI 12/26/2016 CONTRAST:  100mL ISOVUE-300 IOPAMIDOL (ISOVUE-300) INJECTION 61% FINDINGS: There is an open wound involving the plantar aspect of the great toe extending from the IV mid proximal phalanx to the mid distal phalanx. There is a scalloped appearance involving the plantar cortex of the distal phalanx very worrisome for osteomyelitis. Diffuse severe cellulitis involving the great toe but no discrete drainable soft tissue abscess is identified. The other bony structures are intact. No other areas of osteomyelitis are suspected.  IMPRESSION: Open wound on the plantar aspect of the great toe with CT findings highly suspicious for osteomyelitis involving the plantar cortex of the distal phalanx. Cellulitis without discrete drainable soft tissue abscess. Electronically Signed   By: Rudie MeyerP.  Gallerani M.D.   On: 01/30/2018 15:18    EKG: Not done   Labs on Admission: I have personally reviewed the available labs and imaging studies at the time of the admission.  Pertinent labs:  Sodium 136 potassium 4.0 chloride 101 CO2 26 glucose 314 BUN less than 5 creatinine 0.8 calcium 9.4 CRP less than 0.8, sed rate 5 Lactic acid 1.5 WBC 4.4 hemoglobin 13.9 platelets 192    Assessment/Plan Principal Problem:   Osteomyelitis of great toe of left foot (HCC) Active Problems:   DM type 2,  uncontrolled, with neuropathy (HCC)   Diabetic ulcer of left foot (HCC)   Depression with anxiety   L great toe osteomyelitis -as pt is not systemically ill, will hold off on antibiotics until pt can get debridement and bone bx; spoke with ortho on call who asked to make him NPO after MN and they'd hopefully be able to take him to OR tomorrow. Appreciate assistance. -f/u blood cultures -if becomes unstable or spikes fever, start vanc and cefepime  DM2 -cont home insulin 30 units qHS -SSI -carb controlled diet -NPO after MN  Depression/Anxiety -cont home trazodone, atrarax   DVT prophylaxis: lovenox Code Status:  Full - confirmed with patient/family Family Communication: none  Disposition Plan:  Home once clinically improved Consults called: orthopedic surgery  Admission status: Admit - It is my clinical opinion that admission to INPATIENT is reasonable and necessary because of the expectation that this patient will require hospital care that crosses at least 2 midnights to treat this condition based on the medical complexity of the problems presented and need for IV antibiotics.       Elyse Hsu MD [facility]  If note  is complete, please contact covering daytime or nighttime physician. [email or web address] Password [facility]  01/30/2018, 4:23 PM

## 2018-01-30 NOTE — ED Notes (Signed)
Pt back from CT

## 2018-01-30 NOTE — ED Triage Notes (Signed)
Pt states he has a wound to the bottom of his left foot. Pt states some drainage. Hx of diabetes.

## 2018-01-30 NOTE — Care Management (Signed)
EDCM reviewed CM consult. Patient is being admitted. Unit CM to follow for discharge needs. Cindie Rajagopalan RN CCM  

## 2018-01-30 NOTE — ED Notes (Addendum)
Report attempted to 3W. 3W RN will call back 15+ minutes

## 2018-01-31 ENCOUNTER — Other Ambulatory Visit: Payer: Self-pay

## 2018-01-31 ENCOUNTER — Inpatient Hospital Stay (HOSPITAL_COMMUNITY): Payer: Self-pay

## 2018-01-31 LAB — BASIC METABOLIC PANEL
Anion gap: 11 (ref 5–15)
BUN: 6 mg/dL (ref 6–20)
CO2: 26 mmol/L (ref 22–32)
Calcium: 9 mg/dL (ref 8.9–10.3)
Chloride: 100 mmol/L (ref 98–111)
Creatinine, Ser: 0.62 mg/dL (ref 0.61–1.24)
GFR calc Af Amer: >60 mL/min (ref >60)
GFR calc non Af Amer: >60 mL/min (ref >60)
Glucose, Bld: 310 mg/dL — ABNORMAL HIGH (ref 70–99)
Potassium: 3.7 mmol/L (ref 3.5–5.1)
Sodium: 137 mmol/L (ref 135–145)

## 2018-01-31 LAB — CBC
HCT: 43.9 % (ref 39.0–52.0)
Hemoglobin: 13.6 g/dL (ref 13.0–17.0)
MCH: 24.6 pg — ABNORMAL LOW (ref 26.0–34.0)
MCHC: 31 g/dL (ref 30.0–36.0)
MCV: 79.4 fL — ABNORMAL LOW (ref 80.0–100.0)
Platelets: 167 10*3/uL (ref 150–400)
RBC: 5.53 MIL/uL (ref 4.22–5.81)
RDW: 12.4 % (ref 11.5–15.5)
WBC: 4 10*3/uL (ref 4.0–10.5)
nRBC: 0 % (ref 0.0–0.2)

## 2018-01-31 LAB — GLUCOSE, CAPILLARY
Glucose-Capillary: 297 mg/dL — ABNORMAL HIGH (ref 70–99)
Glucose-Capillary: 270 mg/dL — ABNORMAL HIGH (ref 70–99)
Glucose-Capillary: 215 mg/dL — ABNORMAL HIGH (ref 70–99)
Glucose-Capillary: 211 mg/dL — ABNORMAL HIGH (ref 70–99)

## 2018-01-31 LAB — HIV ANTIBODY (ROUTINE TESTING W REFLEX): HIV Screen 4th Generation wRfx: NONREACTIVE

## 2018-01-31 MED ORDER — INSULIN GLARGINE 100 UNIT/ML ~~LOC~~ SOLN
25.0000 [IU] | Freq: Two times a day (BID) | SUBCUTANEOUS | Status: DC
  Administered 2018-01-31 – 2018-02-03 (×6): 25 [IU] via SUBCUTANEOUS
  Filled 2018-01-31 (×8): qty 0.25

## 2018-01-31 MED ORDER — GLUCERNA SHAKE PO LIQD
237.0000 mL | Freq: Two times a day (BID) | ORAL | Status: DC
  Administered 2018-01-31 – 2018-02-03 (×6): 237 mL via ORAL

## 2018-01-31 MED ORDER — PRO-STAT SUGAR FREE PO LIQD
30.0000 mL | Freq: Two times a day (BID) | ORAL | Status: DC
  Administered 2018-01-31 – 2018-02-03 (×5): 30 mL via ORAL
  Filled 2018-01-31 (×5): qty 30

## 2018-01-31 NOTE — Progress Notes (Addendum)
Inpatient Diabetes Program Recommendations  AACE/ADA: New Consensus Statement on Inpatient Glycemic Control (2015)  Target Ranges:  Prepandial:   less than 140 mg/dL      Peak postprandial:   less than 180 mg/dL (1-2 hours)      Critically ill patients:  140 - 180 mg/dL   Lab Results  Component Value Date   GLUCAP 270 (H) 01/31/2018   HGBA1C 12.0 (H) 01/30/2018    Review of Glycemic Control Results for Lolly MustacheASSUM, Kaivon (MRN [id number]) as of 01/31/2018 13:07  Ref. Range 01/31/2018 06:03 01/31/2018 11:38  Glucose-Capillary Latest Ref Range: 70 - 99 mg/dL 409297 (H) 811270 (H)   Diabetes history: Type 2 DM Outpatient Diabetes medications: Basaglar 30 units QHS, Metformin 1000 mg BID Current orders for Inpatient glycemic control: Lantus 25 units BID, Novolog 0-15 units TID, Novolog 0-5 units QHS  Inpatient Diabetes Program Recommendations:    Consider adding Novolog 7 units TID (assuming patient is consuming >50% of meal).   Addendum@1400 : spoke with patient regarding outpatient diabetes management. Patient verifies taking Basaglar and Metformin. Denies taking Glipizide. Denies missing doses or insulin stretching.   Reviewed patient's current A1c of 12.0%. Explained what a A1c is and what it measures. Also reviewed goal A1c with patient, importance of good glucose control @ home, and blood sugar goals. Reviewed at length patho of DM, need for insulin, role of pancreas, vascular changes that occurs, impact of infection with potential amputation, and other comorbidites of poor glycemic control.  Patient denies drinking sugary beverages. However, works as a Nurse, mental healthdoor-to-door salesman and consumes large quanitites of [employer] food. Patient unsure of how to count carbohydrates. Reviewed carb counting, selecting foods that are of nutritional value, and discussed the impact to blood sugars when consuming large amount of sugar. Will place consult for dietitian to also help review. Additionally, patient walks up to  10 miles per day during his sales.  Patient denies checking CBGs. Stated he ordered a meter online, but it never worked. Will need a meter at discharge. Blood glucose meter (includes lancets and strips) ([id number]). [facility], [facility]. Will place case management consult to determine PCP needs.  Anticipate the need for changes to his insulin regimen at discharge. Will continue to follow.   Thanks, Lujean RaveLauren Rafaella Kole, MSN, RNC-OB Diabetes Coordinator [phone] (8a-5p)

## 2018-01-31 NOTE — Consult Note (Signed)
Reason for Consult:Left great toe osteomyelitis Referring Physician: A Parke Simmersdhikari  Jonathan [name] is an 37 y.o. male.  HPI: Marquis Lunchbrahim presented to the ED with a left great toe ulcer that was causing pain and swelling. It's been going on for about 2 months. 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 He had a similar ulcer just over a year ago that he managed to get healed. He is diabetic but has not been treating it well lately. He denies fevers, chills, sweats, N/V.  Past Medical History:  Diagnosis Date  . Diabetes mellitus     History reviewed. No pertinent surgical history.  Family History  Family history unknown: Yes    Social History:  reports that he has never smoked. He has never used smokeless tobacco. He reports that he drinks about 2.0 standard drinks of alcohol per week. He reports that he does not use drugs.  Allergies: No Known Allergies  Medications: I have reviewed the patient's current medications.  Results for orders placed or performed during the hospital encounter of 01/30/18 (from the past 48 hour(s))  Basic metabolic panel     Status: Abnormal   Collection Time: 01/30/18 12:17 PM  Result Value Ref Range   Sodium 136 135 - 145 mmol/L   Potassium 4.0 3.5 - 5.1 mmol/L   Chloride 101 98 - 111 mmol/L   CO2 26 22 - 32 mmol/L   Glucose, Bld 314 (H) 70 - 99 mg/dL   BUN <5 (L) 6 - 20 mg/dL   Creatinine, Ser 4.090.80 0.61 - 1.24 mg/dL   Calcium 9.4 8.9 - 81.110.3 mg/dL   GFR calc non Af Amer >60 >60 mL/min   GFR calc Af Amer >60 >60 mL/min   Anion gap 9 5 - 15    Comment: Performed at [facility], [address] [place], [place] [address]  CBC     Status: Abnormal   Collection Time: 01/30/18 12:17 PM  Result Value Ref Range   WBC 4.4 4.0 - 10.5 K/uL   RBC 5.85 (H) 4.22 - 5.81 MIL/uL   Hemoglobin 13.9 13.0 - 17.0 g/dL   HCT 29.547.3 62.139.0 - 30.852.0 %   MCV 80.9 80.0 - 100.0 fL   MCH 23.8 (L) 26.0 - 34.0 pg   MCHC 29.4 (L) 30.0 -  36.0 g/dL   RDW 65.712.4 84.611.5 - 96.215.5 %   Platelets 192 150 - 400 K/uL   nRBC 0.0 0.0 - 0.2 %    Comment: Performed at [facility], [address] [place], [place] [address]  Sedimentation rate     Status: None   Collection Time: 01/30/18  1:19 PM  Result Value Ref Range   Sed Rate 5 0 - 16 mm/hr    Comment: Performed at [facility], [address] [place], [place] [address]  C-reactive protein     Status: None   Collection Time: 01/30/18  1:19 PM  Result Value Ref Range   CRP <0.8 <1.0 mg/dL    Comment: Performed at [facility], [address] [place], [place] [address]  Hemoglobin A1c     Status: Abnormal   Collection Time: 01/30/18  5:23 PM  Result Value Ref Range   Hgb A1c MFr Bld 12.0 (H) 4.8 - 5.6 %    Comment: (NOTE) Pre diabetes:          5.7%-6.4% Diabetes:              >  6.4% Glycemic control for   <7.0% adults with diabetes    Mean Plasma Glucose 297.7 mg/dL    Comment: Performed at [facility], [address], [place], [place] [address]  CBG monitoring, ED     Status: Abnormal   Collection Time: 01/30/18 10:10 PM  Result Value Ref Range   Glucose-Capillary 247 (H) 70 - 99 mg/dL   Comment 1 Notify RN    Comment 2 Document in Chart   Basic metabolic panel     Status: Abnormal   Collection Time: 01/31/18  5:51 AM  Result Value Ref Range   Sodium 137 135 - 145 mmol/L   Potassium 3.7 3.5 - 5.1 mmol/L   Chloride 100 98 - 111 mmol/L   CO2 26 22 - 32 mmol/L   Glucose, Bld 310 (H) 70 - 99 mg/dL   BUN 6 6 - 20 mg/dL   Creatinine, Ser 6.04 0.61 - 1.24 mg/dL   Calcium 9.0 8.9 - 54.0 mg/dL   GFR calc non Af Amer >60 >60 mL/min   GFR calc Af Amer >60 >60 mL/min   Anion gap 11 5 - 15    Comment: Performed at [facility], [address], [place], [place] [address]  CBC     Status: Abnormal   Collection Time: 01/31/18  5:51 AM  Result Value Ref Range   WBC 4.0 4.0 - 10.5 K/uL   RBC 5.53 4.22 - 5.81 MIL/uL   Hemoglobin 13.6 13.0 - 17.0  g/dL   HCT 14.7 82.9 - 56.2 %   MCV 79.4 (L) 80.0 - 100.0 fL   MCH 24.6 (L) 26.0 - 34.0 pg   MCHC 31.0 30.0 - 36.0 g/dL   RDW 13.0 86.5 - 78.4 %   Platelets 167 150 - 400 K/uL   nRBC 0.0 0.0 - 0.2 %    Comment: Performed at [facility], [address] [place], [place] [address]  Glucose, capillary     Status: Abnormal   Collection Time: 01/31/18  6:03 AM  Result Value Ref Range   Glucose-Capillary 297 (H) 70 - 99 mg/dL   Comment 1 Notify RN    Comment 2 Document in Chart     Ct Foot Left W Contrast  Result Date: 01/30/2018 CLINICAL DATA:  Diabetic ulcer involving the plantar aspect of the great toe. EXAM: CT OF THE LOWER LEFT EXTREMITY WITH CONTRAST TECHNIQUE: Multidetector CT imaging of the lower left extremity was performed according to the standard protocol following intravenous contrast administration. COMPARISON:  MRI 12/26/2016 CONTRAST:  ISOVUE-300 IOPAMIDOL (ISOVUE-300) INJECTION 61% FINDINGS: There is an open wound involving the plantar aspect of the great toe extending from the IV mid proximal phalanx to the mid distal phalanx. There is a scalloped appearance involving the plantar cortex of the distal phalanx very worrisome for osteomyelitis. Diffuse severe cellulitis involving the great toe but no discrete drainable soft tissue abscess is identified. The other bony structures are intact. No other areas of osteomyelitis are suspected. IMPRESSION: Open wound on the plantar aspect of the great toe with CT findings highly suspicious for osteomyelitis involving the plantar cortex of the distal phalanx. Cellulitis without discrete drainable soft tissue abscess. Electronically Signed   By: Rudie Meyer M.D.   On: 01/30/2018 15:18    Review of Systems  Constitutional: Negative for weight loss.  HENT: Negative for ear discharge, ear pain, hearing loss and tinnitus.   Eyes: Negative for blurred vision, double vision,  photophobia and pain.  Respiratory: Negative for cough,  sputum production and shortness of breath.   Cardiovascular: Negative for chest pain.  Gastrointestinal: Negative for abdominal pain, nausea and vomiting.  Genitourinary: Negative for dysuria, flank pain, frequency and urgency.  Musculoskeletal: Negative for back pain, falls, joint pain, myalgias and neck pain.  Neurological: Negative for dizziness, tingling, sensory change, focal weakness, loss of consciousness and headaches.  Endo/Heme/Allergies: Does not bruise/bleed easily.  Psychiatric/Behavioral: Negative for depression, memory loss and substance abuse. The patient is not nervous/anxious.    Blood pressure 113/86, pulse 84, temperature 98.4 F (36.9 C), temperature source Oral, resp. rate 18, height 6\' 8"  (2.032 m), weight 108.4 kg, SpO2 100 %. Physical Exam  Constitutional: He appears well-developed and well-nourished. No distress.  HENT:  Head: Normocephalic and atraumatic.  Eyes: Conjunctivae are normal. Right eye exhibits no discharge. Left eye exhibits no discharge. No scleral icterus.  Neck: Normal range of motion.  Cardiovascular: Normal rate and regular rhythm.  Respiratory: Effort normal. No respiratory distress.  Musculoskeletal:  LLE No traumatic wounds, ecchymosis, or rash  Mod fusiform edema great toe, ulceration plantar aspect distally, foul odor  No knee or ankle effusion  Knee stable to varus/ valgus and anterior/posterior stress  Sens DPN, SPN, TN intact  Motor EHL, ext, flex, evers 5/5  DP 1+, PT 1+, No significant edema  Neurological: He is alert.  Skin: Skin is warm and dry. He is not diaphoretic.  Psychiatric: He has a normal mood and affect. His behavior is normal.    Assessment/Plan: Left great toe diabetic ulcer with distal phalangeal osteomyelitis -- Dr. Lajoyce Corners to assess later today but suspect will plan on toe amputation on Friday. Have resumed diet for patient.  DM    Freeman Caldron, PA-C Orthopedic Surgery [phone] 01/31/2018, 8:40 AM

## 2018-01-31 NOTE — Progress Notes (Signed)
PROGRESS NOTE    Jonathan [name]  AVW:[id number] DOB: 1980/08/30 DOA: 01/30/2018 PCP: Fleet Contras, MD   Brief Narrative: Patient is a 37 year old male with past medical history of diabetes type II on insulin who presented to the emergency department with complaints of left great toe pain and swelling.  Patient reported that he had to walk 8 to 10 miles a day for his job.  He was noted to be red, swollen and tender on presentation.  There was drainage of pus and it was foul-smelling.  He was admitted in October 2018 with cellulitis of the same toe.  He underwent debridement through podiatry.  No report of fever or chills at home.  CT done here suggestive of osteomyelitis involving the plantar cortex of the left great toe distal phalanx.  Orthopedics consulted for possible amputation.  Hold on antibiotics  Assessment & Plan:   Principal Problem:   Osteomyelitis of great toe of left foot (HCC) Active Problems:   DM type 2, uncontrolled, with neuropathy (HCC)   Diabetic ulcer of left foot (HCC)   Depression with anxiety   Left great toe osteomyelitis: Hemodynamically stable.  Afebrile.  No signs of sepsis.  Antibiotics on hold until patient can get debridement and bone biopsy.  Orthopedics seeing him and possible plan for amputation or debridement. Will follow blood cultures. Also ordered arterial duplex to rule out peripheral artery disease.  Diabetes type 2: On Lantus 30 units nightly at home.  Does not follow with your PCP as an outpatient.  Hemoglobin A1c of 12.  Continue sliding-scale insulin and Lantus.    Depression/anxiety: Continue home trazodone, Atarax       DVT prophylaxis:Lovenox Code Status: Full Family Communication: None present at the bedside Disposition Plan: Home after full work-up   Consultants: Orthopedics  Procedures: None  Antimicrobials: None  Subjective: Patient seen and examined the bedside this morning.  Remains comfortable.  Hemodynamically  stable.  No new complaints.  Objective: Vitals:   01/30/18 1930 01/30/18 2359 01/31/18 0318 01/31/18 0801  BP: 125/87 135/85 129/81 113/86  Pulse: 79 76 79 84  Resp: 16 18 18 18   Temp:  98 F (36.7 C) 98 F (36.7 C) 98.4 F (36.9 C)  TempSrc:  Oral Oral Oral  SpO2: 100% 100% 100% 100%  Weight:  108.4 kg    Height:  6\' 8"  (2.032 m)      Intake/Output Summary (Last 24 hours) at 01/31/2018 0839 Last data filed at 01/31/2018 0600 Gross per 24 hour  Intake 1338.3 ml  Output -  Net 1338.3 ml   Filed Weights   01/30/18 2359  Weight: 108.4 kg    Examination:  General exam: Appears calm and comfortable ,Not in distress,average built HEENT:PERRL,Oral mucosa moist, Ear/Nose normal on gross exam Respiratory system: Bilateral equal air entry, normal vesicular breath sounds, no wheezes or crackles  Cardiovascular system: S1 & S2 heard, RRR. No JVD, murmurs, rubs, gallops or clicks. No pedal edema. Gastrointestinal system: Abdomen is nondistended, soft and nontender. No organomegaly or masses felt. Normal bowel sounds heard. Central nervous system: Alert and oriented. No focal neurological deficits. Extremities: No edema, no clubbing ,no cyanosis, distal peripheral pulses not palpable on lower extremities. Left great toe plantar surface with large amount of callus/dirty skin with a central ulcer, foul-smelling Skin: No rashes, lesions or ulcers,no icterus ,no pallor MSK: Normal muscle bulk,tone ,power Psychiatry: Judgement and insight appear normal. Mood & affect appropriate.     Data Reviewed: I have personally reviewed  following labs and imaging studies  CBC: Recent Labs  Lab 01/30/18 1217 01/31/18 0551  WBC 4.4 4.0  HGB 13.9 13.6  HCT 47.3 43.9  MCV 80.9 79.4*  PLT 192 167   Basic Metabolic Panel: Recent Labs  Lab 01/30/18 1217 01/31/18 0551  NA 136 137  K 4.0 3.7  CL 101 100  CO2 26 26  GLUCOSE 314* 310*  BUN <5* 6  CREATININE 0.80 0.62  CALCIUM 9.4 9.0    GFR: Estimated Creatinine Clearance: 173.3 mL/min (by C-G formula based on SCr of 0.62 mg/dL). Liver Function Tests: No results for input(s): AST, ALT, ALKPHOS, BILITOT, PROT, ALBUMIN in the last 168 hours. No results for input(s): LIPASE, AMYLASE in the last 168 hours. No results for input(s): AMMONIA in the last 168 hours. Coagulation Profile: No results for input(s): INR, PROTIME in the last 168 hours. Cardiac Enzymes: No results for input(s): CKTOTAL, CKMB, CKMBINDEX, TROPONINI in the last 168 hours. BNP (last 3 results) No results for input(s): PROBNP in the last 8760 hours. HbA1C: Recent Labs    01/30/18 1723  HGBA1C 12.0*   CBG: Recent Labs  Lab 01/30/18 2210 01/31/18 0603  GLUCAP 247* 297*   Lipid Profile: No results for input(s): CHOL, HDL, LDLCALC, TRIG, CHOLHDL, LDLDIRECT in the last 72 hours. Thyroid Function Tests: No results [id number] M plantar aspect of the great toe extending from the IV mid proximal phalanx to the mid distal phalanx. There is a scalloped appearance involving the plantar cortex of the distal phalanx very  worrisome for osteomyelitis. Diffuse severe cellulitis involving the great toe but no discrete drainable soft tissue abscess is identified. The other bony structures are intact. No other areas of osteomyelitis are suspected. IMPRESSION: Open wound on the plantar aspect of the great toe with CT findings highly suspicious for osteomyelitis involving the plantar cortex of the distal phalanx. Cellulitis without discrete drainable soft tissue abscess. Electronically Signed   By: P.  Gallerani M.D.   On: 01/30/2018 15:18        Scheduled Meds: . enoxaparin (LOVENOX) injection  40 mg Subcutaneous Q24H  . insulin aspart  0-15 Units Subcutaneous TID WC  . insulin aspart  0-5 Units Subcutaneous QHS  . insulin glargine  25 Units Subcutaneous BID   Continuous Infusions: . sodium chloride 100 mL/hr at 01/31/18 0600     LOS: 1 day    Time spent: 35 mins.More than 50% of that time was spent in counseling and/or coordination of care.      Audriana Aldama, MD [facility] Pager [phone]  If 7PM-7AM, please contact night-coverage [email or web address] Password [email or web address] 01/31/2018, 8:39 AM

## 2018-01-31 NOTE — Consult Note (Signed)
WOC consult for left foot requested prior to ortho service involvement. CT findings highly suspicious for osteomyelitis involving the plantar cortex of the distal phalanx. This complex medical condition is beyond the scope of practice for WOC nursing. Progress notes indicate that ortho team will be following for possible surgery. Please refer to their team for further questions regarding plan of care. Please re-consult if further assistance is needed.  Thank-you,  Cammie Mcgeeawn Markita Stcharles MSN, RN, CWOCN, [place]CN-AP, CNS [phone]

## 2018-01-31 NOTE — H&P (View-Only) (Signed)
Reason for Consult:Left great toe osteomyelitis Referring Physician: A Adhikari  Jonathan [name] is an 37 y.o. male.  HPI: Jonathan [name] presented to the ED with a left great toe ulcer that was causing pain and swelling. It's been going on for about 2 months. 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 He had a similar ulcer just over a year ago that he managed to get healed. He is diabetic but has not been treating it well lately. He denies fevers, chills, sweats, N/V.  Past Medical History:  Diagnosis Date  . Diabetes mellitus     History reviewed. No pertinent surgical history.  Family History  Family history unknown: Yes    Social History:  reports that he has never smoked. He has never used smokeless tobacco. He reports that he drinks about 2.0 standard drinks of alcohol per week. He reports that he does not use drugs.  Allergies: No Known Allergies  Medications: I have reviewed the patient's current medications.  Results for orders placed or performed during the hospital encounter of 01/30/18 (from the past 48 hour(s))  Basic metabolic panel     Status: Abnormal   Collection Time: 01/30/18 12:17 PM  Result Value Ref Range   Sodium 136 135 - 145 mmol/L   Potassium 4.0 3.5 - 5.1 mmol/L   Chloride 101 98 - 111 mmol/L   CO2 26 22 - 32 mmol/L   Glucose, Bld 314 (H) 70 - 99 mg/dL   BUN <5 (L) 6 - 20 mg/dL   Creatinine, Ser 0.80 0.61 - 1.24 mg/dL   Calcium 9.4 8.9 - 10.3 mg/dL   GFR calc non Af Amer >60 >60 mL/min   GFR calc Af Amer >60 >60 mL/min   Anion gap 9 5 - 15    Comment: Performed at [facility], [address] [place], [place] [address]  CBC     Status: Abnormal   Collection Time: 01/30/18 12:17 PM  Result Value Ref Range   WBC 4.4 4.0 - 10.5 K/uL   RBC 5.85 (H) 4.22 - 5.81 MIL/uL   Hemoglobin 13.9 13.0 - 17.0 g/dL   HCT 47.3 39.0 - 52.0 %   MCV 80.9 80.0 - 100.0 fL   MCH 23.8 (L) 26.0 - 34.0 pg   MCHC 29.4 (L) 30.0 -  36.0 g/dL   RDW 12.4 11.5 - 15.5 %   Platelets 192 150 - 400 K/uL   nRBC 0.0 0.0 - 0.2 %    Comment: Performed at [facility], [address] [place], [place] [address]  Sedimentation rate     Status: None   Collection Time: 01/30/18  1:19 PM  Result Value Ref Range   Sed Rate 5 0 - 16 mm/hr    Comment: Performed at [facility], [address] [place], [place] [address]  C-reactive protein     Status: None   Collection Time: 01/30/18  1:19 PM  Result Value Ref Range   CRP <0.8 <1.0 mg/dL    Comment: Performed at  Hospital Lab, [address] [place], [place] [address]  Hemoglobin A1c     Status: Abnormal   Collection Time: 01/30/18  5:23 PM  Result Value Ref Range   Hgb A1c MFr Bld 12.0 (H) 4.8 - 5.6 %    Comment: (NOTE) Pre diabetes:          5.7%-6.4% Diabetes:              >  6.4% Glycemic control for   <7.0% adults with diabetes    Mean Plasma Glucose 297.7 mg/dL    Comment: Performed at [facility], [address] [place], [place] [address]  CBG monitoring, ED     Status: Abnormal   Collection Time: 01/30/18 10:10 PM  Result Value Ref Range   Glucose-Capillary 247 (H) 70 - 99 mg/dL   Comment 1 Notify RN    Comment 2 Document in Chart   Basic metabolic panel     Status: Abnormal   Collection Time: 01/31/18  5:51 AM  Result Value Ref Range   Sodium 137 135 - 145 mmol/L   Potassium 3.7 3.5 - 5.1 mmol/L   Chloride 100 98 - 111 mmol/L   CO2 26 22 - 32 mmol/L   Glucose, Bld 310 (H) 70 - 99 mg/dL   BUN 6 6 - 20 mg/dL   Creatinine, Ser 0.62 0.61 - 1.24 mg/dL   Calcium 9.0 8.9 - 10.3 mg/dL   GFR calc non Af Amer >60 >60 mL/min   GFR calc Af Amer >60 >60 mL/min   Anion gap 11 5 - 15    Comment: Performed at [facility], [address] [place], [place] [address]  CBC     Status: Abnormal   Collection Time: 01/31/18  5:51 AM  Result Value Ref Range   WBC 4.0 4.0 - 10.5 K/uL   RBC 5.53 4.22 - 5.81 MIL/uL   Hemoglobin 13.6 13.0 - 17.0  g/dL   HCT 43.9 39.0 - 52.0 %   MCV 79.4 (L) 80.0 - 100.0 fL   MCH 24.6 (L) 26.0 - 34.0 pg   MCHC 31.0 30.0 - 36.0 g/dL   RDW 12.4 11.5 - 15.5 %   Platelets 167 150 - 400 K/uL   nRBC 0.0 0.0 - 0.2 %    Comment: Performed at [facility], [address] [place], [place] [address]  Glucose, capillary     Status: Abnormal   Collection Time: 01/31/18  6:03 AM  Result Value Ref Range   Glucose-Capillary 297 (H) 70 - 99 mg/dL   Comment 1 Notify RN    Comment 2 Document in Chart     Ct Foot Left W Contrast  Result Date: 01/30/2018 CLINICAL DATA:  Diabetic ulcer involving the plantar aspect of the great toe. EXAM: CT OF THE LOWER LEFT EXTREMITY WITH CONTRAST TECHNIQUE: Multidetector CT imaging of the lower left extremity was performed according to the standard protocol following intravenous contrast administration. COMPARISON:  MRI 12/26/2016 CONTRAST:  100mL ISOVUE-300 IOPAMIDOL (ISOVUE-300) INJECTION 61% FINDINGS: There is an open wound involving the plantar aspect of the great toe extending from the IV mid proximal phalanx to the mid distal phalanx. There is a scalloped appearance involving the plantar cortex of the distal phalanx very worrisome for osteomyelitis. Diffuse severe cellulitis involving the great toe but no discrete drainable soft tissue abscess is identified. The other bony structures are intact. No other areas of osteomyelitis are suspected. IMPRESSION: Open wound on the plantar aspect of the great toe with CT findings highly suspicious for osteomyelitis involving the plantar cortex of the distal phalanx. Cellulitis without discrete drainable soft tissue abscess. Electronically Signed   By: P.  Gallerani M.D.   On: 01/30/2018 15:18    Review of Systems  Constitutional: Negative for weight loss.  HENT: Negative for ear discharge, ear pain, hearing loss and tinnitus.   Eyes: Negative for blurred vision, double vision,   photophobia and pain.  Respiratory: Negative for cough,  sputum production and shortness of breath.   Cardiovascular: Negative for chest pain.  Gastrointestinal: Negative for abdominal pain, nausea and vomiting.  Genitourinary: Negative for dysuria, flank pain, frequency and urgency.  Musculoskeletal: Negative for back pain, falls, joint pain, myalgias and neck pain.  Neurological: Negative for dizziness, tingling, sensory change, focal weakness, loss of consciousness and headaches.  Endo/Heme/Allergies: Does not bruise/bleed easily.  Psychiatric/Behavioral: Negative for depression, memory loss and substance abuse. The patient is not nervous/anxious.    Blood pressure 113/86, pulse 84, temperature 98.4 F (36.9 C), temperature source Oral, resp. rate 18, height 6' 8" (2.032 m), weight 108.4 kg, SpO2 100 %. Physical Exam  Constitutional: He appears well-developed and well-nourished. No distress.  HENT:  Head: Normocephalic and atraumatic.  Eyes: Conjunctivae are normal. Right eye exhibits no discharge. Left eye exhibits no discharge. No scleral icterus.  Neck: Normal range of motion.  Cardiovascular: Normal rate and regular rhythm.  Respiratory: Effort normal. No respiratory distress.  Musculoskeletal:  LLE No traumatic wounds, ecchymosis, or rash  Mod fusiform edema great toe, ulceration plantar aspect distally, foul odor  No knee or ankle effusion  Knee stable to varus/ valgus and anterior/posterior stress  Sens DPN, SPN, TN intact  Motor EHL, ext, flex, evers 5/5  DP 1+, PT 1+, No significant edema  Neurological: He is alert.  Skin: Skin is warm and dry. He is not diaphoretic.  Psychiatric: He has a normal mood and affect. His behavior is normal.    Assessment/Plan: Left great toe diabetic ulcer with distal phalangeal osteomyelitis -- Dr. Duda to assess later today but suspect will plan on toe amputation on Friday. Have resumed diet for patient.  DM    Delshon Blanchfield J. Adebayo Ensminger, PA-C Orthopedic Surgery [phone] 01/31/2018, 8:40 AM  

## 2018-01-31 NOTE — Progress Notes (Signed)
Initial Nutrition Assessment  DOCUMENTATION CODES:   Not applicable  INTERVENTION:  Provide Glucerna Shake po BID, each supplement provides 220 kcal and 10 grams of protein.  Provide 30 ml Prostat po BID, each supplement provides 100 kcal and 15 grams of protein.   Encourage adequate PO intake.   NUTRITION DIAGNOSIS:   Increased nutrient needs related to wound healing as evidenced by estimated needs.  GOAL:   Patient will meet greater than or equal to 90% of their needs  MONITOR:   PO intake, Supplement acceptance, Labs, Weight trends, I & O's, Skin  REASON FOR ASSESSMENT:   Consult Wound healing  ASSESSMENT:   37 year old male with past medical history of diabetes type II on insulin who presented to the emergency department with complaints of left great toe pain and swelling. CT done here suggestive of osteomyelitis involving the plantar cortex of the left great toe distal phalanx.  Meal completion has been 100%. Pt reports having a good appetite currently and PTA with usual consumption of at least 2 meals a day with snacks in between. Pt reports checking his blood sugar daily in the mornings and avoids most to all sugar sweetened beverages. Pt with no significant weight loss per weight records. RD consulted for wound healing. RD to order nutritional supplements to aid in increased protein and healing needs. Labs and medications reviewed.   NUTRITION - FOCUSED PHYSICAL EXAM:    Most Recent Value  Orbital Region  No depletion  Upper Arm Region  No depletion  Thoracic and Lumbar Region  No depletion  Buccal Region  No depletion  Temple Region  Unable to assess  Clavicle Bone Region  Mild depletion  Clavicle and Acromion Bone Region  No depletion  Scapular Bone Region  No depletion  Dorsal Hand  Unable to assess  Patellar Region  No depletion  Anterior Thigh Region  No depletion  Posterior Calf Region  No depletion  Edema (RD Assessment)  None  Hair  Reviewed  Eyes   Reviewed  Mouth  Reviewed  Skin  Reviewed  Nails  Reviewed       Diet Order:   Diet Order            Diet Carb Modified Fluid consistency: Thin; Room service appropriate? Yes  Diet effective now              EDUCATION NEEDS:   Education needs have been addressed  Skin:  Skin Assessment: Skin Integrity Issues: Skin Integrity Issues:: Diabetic Ulcer Diabetic Ulcer: L great toe  Last BM:  Unknown  Height:   Ht Readings from Last 1 Encounters:  01/30/18 6\' 8"  (2.032 m)    Weight:   Wt Readings from Last 1 Encounters:  01/30/18 108.4 kg    Ideal Body Weight:  102.7 kg  BMI:  Body mass index is 26.26 kg/m.  Estimated Nutritional Needs:   Kcal:  2400-2700  Protein:  115-130 grams  Fluid:  >/= 2.4 L/day    Roslyn SmilingStephanie Jamile Sivils, MS, RD, LDN Pager # [phone] After hours/ weekend pager # [phone]

## 2018-01-31 NOTE — Progress Notes (Signed)
Bilateral lower extremities arterial duplex exam completed. Please see the preliminary notes at [facility] under chart review.  Shammara Jarrett H Dontaye Hur(RDMS RVT) 01/31/18 4:35 PM

## 2018-02-01 ENCOUNTER — Inpatient Hospital Stay (HOSPITAL_COMMUNITY): Payer: Self-pay

## 2018-02-01 ENCOUNTER — Ambulatory Visit (INDEPENDENT_AMBULATORY_CARE_PROVIDER_SITE_OTHER): Payer: Self-pay | Admitting: Physician Assistant

## 2018-02-01 DIAGNOSIS — L0390 Cellulitis, unspecified: Secondary | ICD-10-CM

## 2018-02-01 DIAGNOSIS — I96 Gangrene, not elsewhere classified: Secondary | ICD-10-CM

## 2018-02-01 LAB — BASIC METABOLIC PANEL
Anion gap: 8 (ref 5–15)
BUN: 5 mg/dL — ABNORMAL LOW (ref 6–20)
CO2: 26 mmol/L (ref 22–32)
Calcium: 8.7 mg/dL — ABNORMAL LOW (ref 8.9–10.3)
Chloride: 106 mmol/L (ref 98–111)
Creatinine, Ser: 0.59 mg/dL — ABNORMAL LOW (ref 0.61–1.24)
GFR calc Af Amer: >60 mL/min (ref >60)
GFR calc non Af Amer: >60 mL/min (ref >60)
GLUCOSE: 156 mg/dL — AB (ref 70–99)
Potassium: 3.7 mmol/L (ref 3.5–5.1)
Sodium: 140 mmol/L (ref 135–145)

## 2018-02-01 LAB — CBC WITH DIFFERENTIAL/PLATELET
Abs Immature Granulocytes: 0.02 10*3/uL (ref 0.00–0.07)
BASOS ABS: 0 10*3/uL (ref 0.0–0.1)
Basophils Relative: 0 %
EOS ABS: 0.1 10*3/uL (ref 0.0–0.5)
Eosinophils Relative: 2 %
HCT: 45.7 % (ref 39.0–52.0)
Hemoglobin: 13.9 g/dL (ref 13.0–17.0)
Immature Granulocytes: 0 %
Lymphocytes Relative: 59 %
Lymphs Abs: 2.7 10*3/uL (ref 0.7–4.0)
MCH: 24.4 pg — ABNORMAL LOW (ref 26.0–34.0)
MCHC: 30.4 g/dL (ref 30.0–36.0)
MCV: 80.2 fL (ref 80.0–100.0)
Monocytes Absolute: 0.3 10*3/uL (ref 0.1–1.0)
Monocytes Relative: 8 %
NRBC: 0 % (ref 0.0–0.2)
Neutro Abs: 1.4 10*3/uL — ABNORMAL LOW (ref 1.7–7.7)
Neutrophils Relative %: 31 %
PLATELETS: 166 10*3/uL (ref 150–400)
RBC: 5.7 MIL/uL (ref 4.22–5.81)
RDW: 12.5 % (ref 11.5–15.5)
WBC: 4.5 10*3/uL (ref 4.0–10.5)

## 2018-02-01 LAB — GLUCOSE, CAPILLARY
Glucose-Capillary: 238 mg/dL — ABNORMAL HIGH (ref 70–99)
Glucose-Capillary: 210 mg/dL — ABNORMAL HIGH (ref 70–99)
Glucose-Capillary: 101 mg/dL — ABNORMAL HIGH (ref 70–99)
Glucose-Capillary: 180 mg/dL — ABNORMAL HIGH (ref 70–99)

## 2018-02-01 MED ORDER — INSULIN ASPART 100 UNIT/ML ~~LOC~~ SOLN
7.0000 [IU] | Freq: Three times a day (TID) | SUBCUTANEOUS | Status: DC
  Administered 2018-02-01 – 2018-02-03 (×7): 7 [IU] via SUBCUTANEOUS

## 2018-02-01 NOTE — Progress Notes (Signed)
ABIs completed - Preliminary results found n chart review. [facility] [place] Azuree Minish,RVS 02/01/2018 10:48 AM

## 2018-02-01 NOTE — Care Management Note (Signed)
Case Management Note  Patient Details  Name: Jonathan [name] MRN: [id number] Date of Birth: [birth year]  Subjective/Objective:     Pt admitted with osteomyelitis of the lt great toe. He is from home with relatives.  DME; none Pt has no insurance. Has been getting his insulin through [facility] assist but states this is ending.  No issues with transportation.               Action/Plan: CM met with the patient and he is interested in being set up with on of the [facility]. CM will call in am and obtain him an appointment.  Pt to have surgery tomorrow.  CM following.  Expected Discharge Date:                  Expected Discharge Plan:     In-House Referral:     Discharge planning Services  CM Consult, [facility], Medication Assistance  Post Acute Care Choice:    Choice offered to:     DME Arranged:    DME Agency:     HH Arranged:    HH Agency:     Status of Service:  In process, will continue to follow  If discussed at Long Length of Stay Meetings, dates discussed:    Additional Comments:  Pollie Friar, RN 02/01/2018, 1:07 PM

## 2018-02-01 NOTE — Plan of Care (Signed)
  RD consulted for nutrition education regarding diabetes.   Lab Results  Component Value Date   HGBA1C 12.0 (H) 01/30/2018    RD provided "Carbohydrate Counting for People with Diabetes" handout from the Academy of Nutrition and Dietetics. Discussed different food groups and their effects on blood sugar, emphasizing carbohydrate-containing foods. Provided list of carbohydrates and recommended serving sizes of common foods.  Discussed importance of controlled and consistent carbohydrate intake throughout the day. Provided examples of ways to balance meals/snacks and encouraged intake of high-fiber, whole grain complex carbohydrates. Recommended 60-75 grams of carbohydrates at meals. Teach back method used.  Expect good compliance.  Roslyn SmilingStephanie Mel Langan, MS, RD, LDN Pager # [phone] After hours/ weekend pager # [phone]

## 2018-02-01 NOTE — Plan of Care (Signed)
  Problem: Health Behavior/Discharge Planning: Goal: Ability to manage health-related needs will improve Outcome: Not Progressing   Problem: Metabolic: Goal: Ability to maintain appropriate glucose levels will improve Outcome: Not Progressing   Patient continuing to ask for Ice Cream, graham crackers, and sugary drinks. Educated patient on his high blood sugars and the damage they are causing, delaying healing, increasing infection risk, and risk for future amputations and other complications. Patient states "I know." Still wanting ice cream.

## 2018-02-01 NOTE — Progress Notes (Signed)
Inpatient Diabetes Program Recommendations  AACE/ADA: New Consensus Statement on Inpatient Glycemic Control (2015)  Target Ranges:  Prepandial:   less than 140 mg/dL      Peak postprandial:   less than 180 mg/dL (1-2 hours)      Critically ill patients:  140 - 180 mg/dL   Lab Results  Component Value Date   GLUCAP 210 (H) 02/01/2018   HGBA1C 12.0 (H) 01/30/2018    Review of Glycemic Control Results for Jonathan [name], Jonathan [name] (MRN [id number]) as of 02/01/2018 15:01  Ref. Range 01/31/2018 21:30 02/01/2018 06:10 02/01/2018 11:12  Glucose-Capillary Latest Ref Range: 70 - 99 mg/dL 409211 (H) 811238 (H) 914210 (H)   Diabetes history: Type 2 DM Outpatient Diabetes medications: Basaglar 30 units QHS, Metformin 1000 mg BID Current orders for Inpatient glycemic control: Lantus 25 units BID, Novolog 0-15 units TID, Novolog 0-5 units QHS, Novolog 7 units TID  Inpatient Diabetes Program Recommendations:    Spoke with patient again regarding DM.   Reviewed again the patient's A1C and importance of getting value down to an acceptable range. Reviewed at length patho of DM, need for insulin, role of pancreas, vascular changes that occurs, impact of infection with potential amputation, and other comorbidites of poor glycemic control.  Per RN notes, there's discrepancy with information patient provided yesterday regarding sugar intake. When inquiring about ice cream and sugary beverages, patient states, "It was just a craving, I never have that at home." Provided additional education on importance of selecting foods that are low in carbohydrates and reviewed information provided by dietitian. Patient states, "I have a better understanding and know I have to start planning more of what I eat." Did confirm that patient will lose his [place] medication assist at the end of the month. Spoke with Harvin HazelKelli, case management and she plans to get patient an appointment in AM with a [facility] and have social work help patient to reapply.    Will need a meter at discharge. Blood glucose meter (includes lancets and strips) ([id number]). Also, provided information on Relion products. Handout provided.  Anticipate the need for changes to his insulin regimen at discharge. Will continue to follow.  Thanks, Lujean RaveLauren Jasenia Weilbacher, MSN, RNC-OB Diabetes Coordinator [phone] (8a-5p)

## 2018-02-01 NOTE — Progress Notes (Signed)
PROGRESS NOTE    Jonathan [name]  ZOX:[id number] DOB: 01-30-1981 DOA: 01/30/2018 PCP: Fleet Contras, MD   Brief Narrative: Patient is a 37 year old male with past medical history of diabetes type II on insulin who presented to the emergency department with complaints of left great toe pain and swelling.  Patient reported that he had to walk 8 to 10 miles a day for his job.  He was noted to be red, swollen and tender on presentation.  There was drainage of pus and it was foul-smelling.  He was admitted in October 2018 with cellulitis of the same toe.  He underwent debridement through podiatry.  No report of fever or chills at home.  CT done here suggestive of osteomyelitis involving the plantar cortex of the left great toe distal phalanx.  Orthopedics consulted for possible amputation.  Hold on antibiotics.Plan for OR tomorrow.  Assessment & Plan:   Principal Problem:   Osteomyelitis of great toe of left foot (HCC) Active Problems:   DM type 2, uncontrolled, with neuropathy (HCC)   Diabetic ulcer of left foot (HCC)   Depression with anxiety   Left great toe osteomyelitis: Hemodynamically stable.  Afebrile.  No signs of sepsis.  Antibiotics on hold until patient can get debridement and bone biopsy.  Orthopedics seeing him and possible plan for amputation or debridement tomorrow. Will follow blood cultures. Ordered arterial duplex to rule out peripheral artery disease but it came out to be negative.  Diabetes type 2: On Lantus 30 units nightly at home.  Does not follow with  PCP as an outpatient.  Hemoglobin A1c of 12.  Continue sliding-scale insulin,novolog  and Lantus.    Depression/anxiety: Continue home trazodone, Atarax  Nutrition Problem: Increased nutrient needs Etiology: wound healing    DVT prophylaxis:Lovenox Code Status: Full Family Communication: None present at the bedside Disposition Plan: Home after full work-up   Consultants: Orthopedics  Procedures:  None  Antimicrobials: None  Subjective: Patient seen and examined the bedside this morning.  Remains comfortable.  Hemodynamically stable.  No new complaints.  Objective: Vitals:   01/31/18 2354 02/01/18 0330 02/01/18 0814 02/01/18 1134  BP: 112/79 114/76 112/82 110/69  Pulse: 62 68 84 69  Resp: 18 18 18 18   Temp: 97.6 F (36.4 C) 97.7 F (36.5 C) 98.3 F (36.8 C) 98.6 F (37 C)  TempSrc: Oral Oral Oral Oral  SpO2: 100% 99% 99% 99%  Weight:      Height:        Intake/Output Summary (Last 24 hours) at 02/01/2018 1204 Last data filed at 02/01/2018 0800 Gross per 24 hour  Intake 1501.5 ml  Output -  Net 1501.5 ml   Filed Weights   01/30/18 2359  Weight: 108.4 kg    Examination:  General exam: Appears calm and comfortable ,Not in distress,average built HEENT:PERRL,Oral mucosa moist, Ear/Nose normal on gross exam Respiratory system: Bilateral equal air entry, normal vesicular breath sounds, no wheezes or crackles  Cardiovascular system: S1 & S2 heard, RRR. No JVD, murmurs, rubs, gallops or clicks. No pedal edema. Gastrointestinal system: Abdomen is nondistended, soft and nontender. No organomegaly or masses felt. Normal bowel sounds heard. Central nervous system: Alert and oriented. No focal neurological deficits. Extremities: No edema, no clubbing ,no cyanosis, distal peripheral pulses not palpable on lower extremities. Left great toe plantar surface with large amount of callus/dirty skin with a central ulcer, foul-smelling Skin: No rashes, lesions or ulcers,no icterus ,no pallor MSK: Normal muscle bulk,tone ,power Psychiatry: Judgement and insight  appear normal. Mood & affect appropriate.     Data Reviewed: I have personally reviewed following labs and imaging studies  CBC: Recent Labs  Lab 01/30/18 1217 01/31/18 0551 02/01/18 0422  WBC 4.4 4.0 4.5  NEUTROABS  --   --  1.4*  HGB 13.9 13.6 13.9  HCT 47.3 43.9 45.7  MCV 80.9 79.4* 80.2  PLT 192 167 166    Basic Metabolic Panel: Recent Labs  Lab 01/30/18 1217 01/31/18 0551 02/01/18 0422  NA 136 137 140  K 4.0 3.7 3.7  CL 101 100 106  CO2 26 26 26   GLUCOSE 314* 310* 156*  BUN <5* 6 5*  CREATININE 0.80 0.62 0.59*  CALCIUM 9.4 9.0 8.7*   GFR: Estimated Creatinine Clearance: 173.3 mL/min (A) (by C-G formula based on SCr of 0.59 mg/dL (L)). Liver Function Tests: No results for input(s): AST, ALT, ALKPHOS, BILITOT, PROT, ALBUMIN in the last 168 hours. No results for input(s): LIPASE, AMYLASE in the last 168 hours. No results for input(s): AMMONIA in the last 168 hours. Coagulation Profile: No results for input(s): INR, PROTIME in the last 168 hours. Cardiac Enzymes: No results for input(s): CKTOTAL, CKMB, CKMBINDEX, TROPONINI in the last 168 hours. BNP (last 3 results) No results for input(s): PROBNP in the last 8760 hours. HbA1C: Recent Labs    01/30/18 1723  HGBA1C 12.0*   CBG: Recent Labs  Lab 01/31/18 1138 01/31/18 1656 01/31/18 2130 02/01/18 0610 02/01/18 1112  GLUCAP 270* 215* 211* 238* 210*   Lipid Profile: No results for input(s): CHOL, HDL, LDLCALC, TRIG, CHOLHDL, LDLDIRECT in the last 72 hours. Thyroid Function Tests: No results for input(s): TSH, T4TOTAL, FREET4, T3FREE, THYROIDAB in the last 72 hours. Anemia Panel: No results for input(s): VITAMINB12, FOLATE, FERRITIN, TIBC, IRON, RETICCTPCT in the last 72 hours. Sepsis Labs: No results for input(s): PROCALCITON, LATICACIDVEN in the last 168 hours.  Recent Results (from the past 240 hour(s))  Culture, blood (routine x 2)     Status: None (Preliminary result)   Collection Time: 01/30/18  5:18 PM  Result Value Ref Range Status   Specimen Description BLOOD RIGHT HAND  Final   Special Requests   Final    BOTTLES DRAWN AEROBIC ONLY Blood Culture results may not be optimal due to an inadequate volume of blood received in culture bottles   Culture   Final    NO GROWTH < 24 HOURS Performed at [facility], [address] [place], [place] [address]    Report Status PENDING  Incomplete  Culture, blood (routine x 2)     Status: None (Preliminary result)   Collection Time: 01/30/18  5:22 PM  Result Value Ref Range Status   Specimen Description BLOOD LEFT HAND  Final   Special Requests   Final    BOTTLES DRAWN AEROBIC AND ANAEROBIC Blood Culture results may not be optimal due to an inadequate volume of blood received in culture bottles   Culture   Final    NO GROWTH < 24 HOURS Performed at [facility], [address], [place], [place] [address]    Report Status PENDING  Incomplete         Radiology Studies: Ct Foot Left W Contrast  Result Date: 01/30/2018 CLINICAL DATA:  Diabetic ulcer involving the plantar aspect of the great toe. EXAM: CT OF THE LOWER LEFT EXTREMITY WITH CONTRAST TECHNIQUE: Multidetector CT imaging of the lower left extremity was performed according to the standard protocol following  intravenous contrast administration. COMPARISON:  MRI 12/26/2016 CONTRAST:  ISOVUE-300 IOPAMIDOL (ISOVUE-300) INJECTION 61% FINDINGS: There is an open wound involving the plantar aspect of the great toe extending from the IV mid proximal phalanx to the mid distal phalanx. There is a scalloped appearance involving the plantar cortex of the distal phalanx very worrisome for osteomyelitis. Diffuse severe cellulitis involving the great toe but no discrete drainable soft tissue abscess is identified. The other bony structures are intact. No other areas of osteomyelitis are suspected. IMPRESSION: Open wound on the plantar aspect of the great toe with CT findings highly suspicious for osteomyelitis involving the plantar cortex of the distal phalanx. Cellulitis without discrete drainable soft tissue abscess. Electronically Signed   By: Rudie Meyer M.D.   On: 01/30/2018 15:18   Vas [place] Vanice Sarah With/wo Tbi  Result Date: 02/01/2018 LOWER EXTREMITY DOPPLER STUDY Indications: Ulceration, and  gangrene. Left Great Toe High Risk Factors: Diabetes.  Performing Technologist: Milta Deiters, [place] [facility]  Examination Guidelines: A complete evaluation includes at minimum, Doppler waveform signals and systolic blood pressure reading at the level of bilateral brachial, anterior tibial, and posterior tibial arteries, when vessel segments are accessible. Bilateral testing is considered an integral part of a complete examination. Photoelectric Plethysmograph (PPG) waveforms and toe systolic pressure readings are included as required and additional duplex testing as needed. Limited examinations for reoccurring indications may be performed as noted.  ABI Findings: +---------+------------------+-----+---------+--------+ Right    Rt Pressure (mmHg)IndexWaveform Comment  +---------+------------------+-----+---------+--------+ Brachial 124                    triphasic         +---------+------------------+-----+---------+--------+ PTA      152               1.23 triphasic         +---------+------------------+-----+---------+--------+ DP       171               1.38 triphasic         +---------+------------------+-----+---------+--------+ Great Toe140               1.13                   +---------+------------------+-----+---------+--------+ +---------+------------------+-----+---------+----------------+ Left     Lt Pressure (mmHg)IndexWaveform Comment          +---------+------------------+-----+---------+----------------+ Brachial 119                    triphasic                 +---------+------------------+-----+---------+----------------+ PTA      158               1.27 triphasic                 +---------+------------------+-----+---------+----------------+ DP       149               1.20 triphasic                 +---------+------------------+-----+---------+----------------+ Great Toe255               2.06          Non compressible  +---------+------------------+-----+---------+----------------+ +-------+-----------+-----------+------------+------------+ ABI/TBIToday's ABIToday's TBIPrevious ABIPrevious TBI +-------+-----------+-----------+------------+------------+ Right  1.38       1.13                                +-------+-----------+-----------+------------+------------+  Left   255        2.06                                +-------+-----------+-----------+------------+------------+  Summary: Right: Resting right ankle-brachial index indicates noncompressible right lower extremity arteries.The right toe-brachial index is normal. Left: Resting left ankle-brachial index is within normal range. No evidence of significant left lower extremity arterial disease. The left toe-brachial index is abnormal. TBIs are unreliable due to non compressible arteies  *See table(s) above for measurements and observations.    Preliminary    Vas [place] Lower Extremity Arterial Duplex  Result Date: 01/31/2018 LOWER EXTREMITY ARTERIAL DUPLEX STUDY Indications: Leg pain. High Risk Factors: Diabetes.  Current ABI: No current study Comparison Study: ABI on 12/06/2016 Performing Technologist: Hongying Cole  Examination Guidelines: A complete evaluation includes B-mode imaging, spectral Doppler, color Doppler, and power Doppler as needed of all accessible portions of each vessel. Bilateral testing is considered an integral part of a complete examination. Limited examinations for reoccurring indications may be performed as noted.  Right Duplex Findings: +-----------+--------+-----+--------+---------+--------+            PSV cm/sRatioStenosisWaveform Comments +-----------+--------+-----+--------+---------+--------+ CFA Prox   125                  biphasic          +-----------+--------+-----+--------+---------+--------+ DFA        74                   biphasic          +-----------+--------+-----+--------+---------+--------+ SFA  Prox   86                   triphasic         +-----------+--------+-----+--------+---------+--------+ SFA Mid    100                  triphasic         +-----------+--------+-----+--------+---------+--------+ SFA Distal 106                  triphasic         +-----------+--------+-----+--------+---------+--------+ POP Prox   60                   triphasic         +-----------+--------+-----+--------+---------+--------+ POP Mid                         triphasic         +-----------+--------+-----+--------+---------+--------+ POP Distal 68                   triphasic         +-----------+--------+-----+--------+---------+--------+ ATA Prox   80                   triphasic         +-----------+--------+-----+--------+---------+--------+ ATA Mid    69                   triphasic         +-----------+--------+-----+--------+---------+--------+ ATA Distal 62                   triphasic         +-----------+--------+-----+--------+---------+--------+ PTA Prox   70  triphasic         +-----------+--------+-----+--------+---------+--------+ PTA Mid    81                   triphasic         +-----------+--------+-----+--------+---------+--------+ PTA Distal 71                   triphasic         +-----------+--------+-----+--------+---------+--------+ PERO Prox  73                   triphasic         +-----------+--------+-----+--------+---------+--------+ PERO Distal45                   triphasic         +-----------+--------+-----+--------+---------+--------+ DP         17                   biphasic          +-----------+--------+-----+--------+---------+--------+  Left Duplex Findings: +-----------+--------+-----+--------+---------+--------+            PSV cm/sRatioStenosisWaveform Comments +-----------+--------+-----+--------+---------+--------+ CFA Prox   118                  biphasic           +-----------+--------+-----+--------+---------+--------+ DFA        55                   triphasic         +-----------+--------+-----+--------+---------+--------+ SFA Prox   79                   triphasic         +-----------+--------+-----+--------+---------+--------+ SFA Mid    137                  triphasic         +-----------+--------+-----+--------+---------+--------+ SFA Distal 88                   triphasic         +-----------+--------+-----+--------+---------+--------+ POP Prox   85                   triphasic         +-----------+--------+-----+--------+---------+--------+ POP Distal 113                  triphasic         +-----------+--------+-----+--------+---------+--------+ ATA Prox   136                  triphasic         +-----------+--------+-----+--------+---------+--------+ ATA Mid    109                  biphasic          +-----------+--------+-----+--------+---------+--------+ ATA Distal 95                   triphasic         +-----------+--------+-----+--------+---------+--------+ PTA Prox   96                   biphasic          +-----------+--------+-----+--------+---------+--------+ PTA Mid    67                   biphasic          +-----------+--------+-----+--------+---------+--------+ PTA Distal 77  biphasic          +-----------+--------+-----+--------+---------+--------+ PERO Prox  42                   biphasic          +-----------+--------+-----+--------+---------+--------+ PERO Mid   46                   triphasic         +-----------+--------+-----+--------+---------+--------+ PERO Distal44                   triphasic         +-----------+--------+-----+--------+---------+--------+ DP         88                   biphasic          +-----------+--------+-----+--------+---------+--------+  Summary: Right: Normal examination. No evidence of arterial  occlusive disease. Left: Normal examination. No evidence of arterial occlusive disease.  See table(s) above for measurements and observations. Electronically signed by Nanetta BattyJonathan Berry MD on 01/31/2018 at 4:45:55 PM.    Final         Scheduled Meds: . enoxaparin (LOVENOX) injection  40 mg Subcutaneous Q24H  . feeding supplement (GLUCERNA SHAKE)  237 mL Oral BID BM  . feeding supplement (PRO-STAT SUGAR FREE 64)  30 mL Oral BID  . insulin aspart  0-15 Units Subcutaneous TID WC  . insulin aspart  0-5 Units Subcutaneous QHS  . insulin aspart  7 Units Subcutaneous TID WC  . insulin glargine  25 Units Subcutaneous BID   Continuous Infusions:    LOS: 2 days    Time spent: 25 mins.More than 50% of that time was spent in counseling and/or coordination of care.      Burnadette PopAmrit Shellene Sweigert, MD [facility] Pager [phone]  If 7PM-7AM, please contact night-coverage [email or web address] Password [email or web address] 02/01/2018, 12:04 PM

## 2018-02-02 ENCOUNTER — Inpatient Hospital Stay (HOSPITAL_COMMUNITY): Payer: Self-pay | Admitting: Anesthesiology

## 2018-02-02 ENCOUNTER — Encounter (HOSPITAL_COMMUNITY)
Admission: EM | Disposition: A | Discharge status: Home / Self Care | Payer: Self-pay | Source: Home / Self Care | Attending: Internal Medicine

## 2018-02-02 ENCOUNTER — Encounter (HOSPITAL_COMMUNITY): Payer: Self-pay

## 2018-02-02 DIAGNOSIS — M86172 Other acute osteomyelitis, left ankle and foot: Principal | ICD-10-CM

## 2018-02-02 HISTORY — PX: AMPUTATION: SHX166

## 2018-02-02 LAB — GLUCOSE, CAPILLARY
Glucose-Capillary: 234 mg/dL — ABNORMAL HIGH (ref 70–99)
Glucose-Capillary: 153 mg/dL — ABNORMAL HIGH (ref 70–99)
Glucose-Capillary: 130 mg/dL — ABNORMAL HIGH (ref 70–99)
Glucose-Capillary: 132 mg/dL — ABNORMAL HIGH (ref 70–99)
Glucose-Capillary: 214 mg/dL — ABNORMAL HIGH (ref 70–99)
Glucose-Capillary: 227 mg/dL — ABNORMAL HIGH (ref 70–99)

## 2018-02-02 LAB — SURGICAL PCR SCREEN
MRSA, PCR: NEGATIVE
Staphylococcus aureus: POSITIVE — AB

## 2018-02-02 SURGERY — AMPUTATION DIGIT
Anesthesia: Monitor Anesthesia Care | Site: Foot | Laterality: Left

## 2018-02-02 MED ORDER — PROPOFOL 10 MG/ML IV BOLUS
INTRAVENOUS | Status: DC | PRN
  Administered 2018-02-02 (×3): 20 mg via INTRAVENOUS

## 2018-02-02 MED ORDER — ACETAMINOPHEN 325 MG PO TABS: 325.0000 mg | ORAL_TABLET | Freq: Four times a day (QID) | ORAL | Status: DC | PRN

## 2018-02-02 MED ORDER — SODIUM CHLORIDE 0.9 % IV SOLN
INTRAVENOUS | Status: DC
  Administered 2018-02-02: 13:00:00 via INTRAVENOUS

## 2018-02-02 MED ORDER — LIDOCAINE 2% (20 MG/ML) 5 ML SYRINGE
INTRAMUSCULAR | Status: DC | PRN
  Administered 2018-02-02: 60 mg via INTRAVENOUS

## 2018-02-02 MED ORDER — MUPIROCIN 2 % EX OINT
1.0000 "application " | TOPICAL_OINTMENT | Freq: Two times a day (BID) | CUTANEOUS | Status: DC
  Administered 2018-02-02: 1 via TOPICAL
  Filled 2018-02-02: qty 22

## 2018-02-02 MED ORDER — FENTANYL CITRATE (PF) 250 MCG/5ML IJ SOLN
INTRAMUSCULAR | Status: AC
  Filled 2018-02-02: qty 5

## 2018-02-02 MED ORDER — CEFAZOLIN SODIUM-DEXTROSE 1-4 GM/50ML-% IV SOLN
1.0000 g | Freq: Four times a day (QID) | INTRAVENOUS | Status: AC
  Administered 2018-02-02 – 2018-02-03 (×3): 1 g via INTRAVENOUS
  Filled 2018-02-02 (×3): qty 50

## 2018-02-02 MED ORDER — MIDAZOLAM HCL 2 MG/2ML IJ SOLN
INTRAMUSCULAR | Status: AC
  Filled 2018-02-02: qty 2

## 2018-02-02 MED ORDER — POLYETHYLENE GLYCOL 3350 17 G PO PACK: 17.0000 g | PACK | Freq: Every day | ORAL | Status: DC | PRN

## 2018-02-02 MED ORDER — HYDROMORPHONE HCL 1 MG/ML IJ SOLN
0.5000 mg | INTRAMUSCULAR | Status: DC | PRN
  Administered 2018-02-03 (×2): 1 mg via INTRAVENOUS
  Filled 2018-02-02 (×2): qty 1

## 2018-02-02 MED ORDER — OXYCODONE HCL 5 MG PO TABS: 5.0000 mg | ORAL_TABLET | ORAL | Status: DC | PRN

## 2018-02-02 MED ORDER — OXYCODONE HCL 5 MG/5ML PO SOLN: 5.0000 mg | Freq: Once | ORAL | Status: DC | PRN

## 2018-02-02 MED ORDER — FENTANYL CITRATE (PF) 100 MCG/2ML IJ SOLN
INTRAMUSCULAR | Status: DC | PRN
  Administered 2018-02-02 (×2): 50 ug via INTRAVENOUS

## 2018-02-02 MED ORDER — DOCUSATE SODIUM 100 MG PO CAPS
100.0000 mg | ORAL_CAPSULE | Freq: Two times a day (BID) | ORAL | Status: DC
  Administered 2018-02-02 – 2018-02-03 (×2): 100 mg via ORAL
  Filled 2018-02-02 (×2): qty 1

## 2018-02-02 MED ORDER — ONDANSETRON HCL 4 MG PO TABS: 4.0000 mg | ORAL_TABLET | Freq: Four times a day (QID) | ORAL | Status: DC | PRN

## 2018-02-02 MED ORDER — ONDANSETRON HCL 4 MG/2ML IJ SOLN: 4.0000 mg | Freq: Four times a day (QID) | INTRAMUSCULAR | Status: DC | PRN

## 2018-02-02 MED ORDER — BISACODYL 10 MG RE SUPP: 10.0000 mg | Freq: Every day | RECTAL | Status: DC | PRN

## 2018-02-02 MED ORDER — OXYCODONE HCL 5 MG PO TABS: 10.0000 mg | ORAL_TABLET | ORAL | Status: DC | PRN

## 2018-02-02 MED ORDER — CHLORHEXIDINE GLUCONATE 4 % EX LIQD: 60.0000 mL | Freq: Once | CUTANEOUS | Status: DC

## 2018-02-02 MED ORDER — ONDANSETRON HCL 4 MG/2ML IJ SOLN: 4.0000 mg | Freq: Once | INTRAMUSCULAR | Status: DC | PRN

## 2018-02-02 MED ORDER — FENTANYL CITRATE (PF) 100 MCG/2ML IJ SOLN: 25.0000 ug | INTRAMUSCULAR | Status: DC | PRN

## 2018-02-02 MED ORDER — 0.9 % SODIUM CHLORIDE (POUR BTL) OPTIME
TOPICAL | Status: DC | PRN
  Administered 2018-02-02: 1000 mL

## 2018-02-02 MED ORDER — METOCLOPRAMIDE HCL 5 MG/ML IJ SOLN: 5.0000 mg | Freq: Three times a day (TID) | INTRAMUSCULAR | Status: DC | PRN

## 2018-02-02 MED ORDER — OXYCODONE HCL 5 MG PO TABS: 5.0000 mg | ORAL_TABLET | Freq: Once | ORAL | Status: DC | PRN

## 2018-02-02 MED ORDER — METHOCARBAMOL 1000 MG/10ML IJ SOLN
500.0000 mg | Freq: Four times a day (QID) | INTRAVENOUS | Status: DC | PRN
  Filled 2018-02-02: qty 5

## 2018-02-02 MED ORDER — MAGNESIUM CITRATE PO SOLN: 1.0000 | Freq: Once | ORAL | Status: DC | PRN

## 2018-02-02 MED ORDER — LACTATED RINGERS IV SOLN
INTRAVENOUS | Status: DC
  Administered 2018-02-02: 09:00:00 via INTRAVENOUS

## 2018-02-02 MED ORDER — METOCLOPRAMIDE HCL 5 MG PO TABS: 5.0000 mg | ORAL_TABLET | Freq: Three times a day (TID) | ORAL | Status: DC | PRN

## 2018-02-02 MED ORDER — METHOCARBAMOL 500 MG PO TABS: 500.0000 mg | ORAL_TABLET | Freq: Four times a day (QID) | ORAL | Status: DC | PRN

## 2018-02-02 MED ORDER — MIDAZOLAM HCL 5 MG/5ML IJ SOLN
INTRAMUSCULAR | Status: DC | PRN
  Administered 2018-02-02: 2 mg via INTRAVENOUS

## 2018-02-02 MED ORDER — CEFAZOLIN SODIUM-DEXTROSE 2-4 GM/100ML-% IV SOLN
2.0000 g | INTRAVENOUS | Status: AC
  Administered 2018-02-02: 2 g via INTRAVENOUS
  Filled 2018-02-02: qty 100

## 2018-02-02 SURGICAL SUPPLY — 29 items
BLADE SURG 21 STRL SS (BLADE) ×3 IMPLANT
BNDG CMPR 9X4 STRL LF SNTH (GAUZE/BANDAGES/DRESSINGS)
BNDG COHESIVE 4X5 TAN STRL (GAUZE/BANDAGES/DRESSINGS) ×3 IMPLANT
BNDG ESMARK 4X9 LF (GAUZE/BANDAGES/DRESSINGS) IMPLANT
BNDG GAUZE ELAST 4 BULKY (GAUZE/BANDAGES/DRESSINGS) ×3 IMPLANT
COVER SURGICAL LIGHT HANDLE (MISCELLANEOUS) ×6 IMPLANT
COVER WAND RF STERILE (DRAPES) ×3 IMPLANT
DRAPE U-SHAPE 47X51 STRL (DRAPES) ×3 IMPLANT
DRSG ADAPTIC 3X8 NADH LF (GAUZE/BANDAGES/DRESSINGS) ×2 IMPLANT
DRSG PAD ABDOMINAL 8X10 ST (GAUZE/BANDAGES/DRESSINGS) ×3 IMPLANT
DURAPREP 26ML APPLICATOR (WOUND CARE) ×3 IMPLANT
ELECT REM PT RETURN 9FT ADLT (ELECTROSURGICAL) ×3
ELECTRODE REM PT RTRN 9FT ADLT (ELECTROSURGICAL) ×1 IMPLANT
GAUZE SPONGE 4X4 12PLY STRL (GAUZE/BANDAGES/DRESSINGS) ×2 IMPLANT
GLOVE BIOGEL PI IND STRL 9 (GLOVE) ×1 IMPLANT
GLOVE BIOGEL PI INDICATOR 9 (GLOVE) ×2
GLOVE SURG ORTHO 9.0 STRL STRW (GLOVE) ×3 IMPLANT
GOWN STRL REUS W/ TWL XL LVL3 (GOWN DISPOSABLE) ×2 IMPLANT
GOWN STRL REUS W/TWL XL LVL3 (GOWN DISPOSABLE) ×6
KIT BASIN OR (CUSTOM PROCEDURE TRAY) ×3 IMPLANT
KIT TURNOVER KIT B (KITS) ×3 IMPLANT
MANIFOLD NEPTUNE II (INSTRUMENTS) ×3 IMPLANT
NEEDLE 22X1 1/2 (OR ONLY) (NEEDLE) IMPLANT
NS IRRIG 1000ML POUR BTL (IV SOLUTION) ×3 IMPLANT
PACK ORTHO EXTREMITY (CUSTOM PROCEDURE TRAY) ×3 IMPLANT
PAD ARMBOARD 7.5X6 YLW CONV (MISCELLANEOUS) ×6 IMPLANT
SUT ETHILON 2 0 PSLX (SUTURE) ×3 IMPLANT
SYR CONTROL 10ML LL (SYRINGE) IMPLANT
TOWEL OR 17X26 10 PK STRL BLUE (TOWEL DISPOSABLE) ×3 IMPLANT

## 2018-02-02 NOTE — Progress Notes (Signed)
Orthopedic Tech Progress Note Patient Details:  Jonathan [name] 1980-03-13 [id number]  Ortho Devices Type of Ortho Device: Postop shoe/boot Ortho Device/Splint Interventions: Application   Post Interventions Patient Tolerated: Well Instructions Provided: Care of device   Saul FordyceJennifer C Hammond Obeirne 02/02/2018, 1:17 PM

## 2018-02-02 NOTE — Progress Notes (Signed)
PROGRESS NOTE    Jonathan [name]  ZOX:[id number] DOB: [birth year] DOA: 01/30/2018 PCP: Fleet Contras, MD   Brief Narrative: Patient is a 37 year old male with past medical history of diabetes type II on insulin who presented to the emergency department with complaints of left great toe pain and swelling.  Patient reported that he had to walk 8 to 10 miles a day for his job.  He was noted to be red, swollen and tender on presentation.  There was drainage of pus and it was foul-smelling.  He was admitted in October 2018 with cellulitis of the same toe.  He underwent debridement through podiatry.  No report of fever or chills at home.  CT done here suggestive of osteomyelitis involving the plantar cortex of the left great toe distal phalanx.  Orthopedics consulted and he underwent amputation of the left great toe. Plan for discharge tomorrow.   Assessment & Plan:   Principal Problem:   Osteomyelitis of great toe of left foot (HCC) Active Problems:   DM type 2, uncontrolled, with neuropathy (HCC)   Diabetic ulcer of left foot (HCC)   Depression with anxiety   Left great toe osteomyelitis: Status post amputation of the left great toe by orthopedics.  On cefazolin until discharge. Hemodynamically stable.  Afebrile.  No signs of sepsis.  Ordered arterial duplex to rule out peripheral artery disease but it came out to be negative.  Diabetes type 2: On Lantus 30 units nightly at home.  Does not follow with  PCP as an outpatient.  Hemoglobin A1c of 12.  Continue sliding-scale insulin,novolog  and Lantus. We will adjust the insulin dosing on discharge as per diabetic coordinator.Will appreciate the recommendation.  Depression/anxiety: Continue home trazodone, Atarax  Note to diabetic currently: Nutrition Problem: Increased nutrient needs Etiology: wound healing    DVT prophylaxis:Lovenox Code Status: Full Family Communication: None present at the bedside Disposition Plan: Home  tomorrow   Consultants: Orthopedics  Procedures: None  Antimicrobials: Cefazoline  Subjective: Patient seen and examined the bedside this morning.  Remains comfortable.  Hemodynamically stable.  No new complaints.  Objective: Vitals:   02/02/18 1047 02/02/18 1101 02/02/18 1108 02/02/18 1117  BP: 104/63 109/72  119/79  Pulse: 66 65  65  Resp: 14 14  16   Temp: 97.7 F (36.5 C)  97.7 F (36.5 C)   TempSrc:      SpO2: 98% 97%  99%  Weight:      Height:        Intake/Output Summary (Last 24 hours) at 02/02/2018 1157 Last data filed at 02/02/2018 1018 Gross per 24 hour  Intake 640 ml  Output 5 ml  Net 635 ml   Filed Weights   01/30/18 2359 02/02/18 0915  Weight: 108.4 kg 108 kg    Examination:  General exam: Appears calm and comfortable ,Not in distress,average built HEENT:PERRL,Oral mucosa moist, Ear/Nose normal on gross exam Respiratory system: Bilateral equal air entry, normal vesicular breath sounds, no wheezes or crackles  Cardiovascular system: S1 & S2 heard, RRR. No JVD, murmurs, rubs, gallops or clicks. No pedal edema. Gastrointestinal system: Abdomen is nondistended, soft and nontender. No organomegaly or masses felt. Normal bowel sounds heard. Central nervous system: Alert and oriented. No focal neurological deficits. Extremities: No edema, no clubbing ,no cyanosis Left lower extremity wrapped with dressing.  Skin: No rashes, lesions or ulcers,no icterus ,no pallor MSK: Normal muscle bulk,tone ,power Psychiatry: Judgement and insight appear normal. Mood & affect appropriate.     Data  Reviewed: I have personally reviewed following labs and imaging studies  CBC: Recent Labs  Lab 01/30/18 1217 01/31/18 0551 02/01/18 0422  WBC 4.4 4.0 4.5  NEUTROABS  --   --  1.4*  HGB 13.9 13.6 13.9  HCT 47.3 43.9 45.7  MCV 80.9 79.4* 80.2  PLT 192 167 166   Basic Metabolic Panel: Recent Labs  Lab 01/30/18 1217 01/31/18 0551 02/01/18 0422  NA 136 137 140  K  4.0 3.7 3.7  CL 101 100 106  CO2 26 26 26   GLUCOSE 314* 310* 156*  BUN <5* 6 5*  CREATININE 0.80 0.62 0.59*  CALCIUM 9.4 9.0 8.7*   GFR: Estimated Creatinine Clearance: 173.3 mL/min (A) (by C-G formula based on SCr of 0.59 mg/dL (L)). Liver Function Tests: No results for input(s): AST, ALT, ALKPHOS, BILITOT, PROT, ALBUMIN in the last 168 hours. No results for input(s): LIPASE, AMYLASE in the last 168 hours. No results for input(s): AMMONIA in the last 168 hours. Coagulation Profile: No results for input(s): INR, PROTIME in the last 168 hours. Cardiac Enzymes: No results for input(s): CKTOTAL, CKMB, CKMBINDEX, TROPONINI in the last 168 hours. BNP (last 3 results) No results for input(s): PROBNP in the last 8760 hours. HbA1C: Recent Labs    01/30/18 1723  HGBA1C 12.0*   CBG: Recent Labs  Lab 02/01/18 2119 02/02/18 0611 02/02/18 0825 02/02/18 1052 02/02/18 1122  GLUCAP 180* 234* 153* 130* 132*   Lipid Profile: No results for input(s): CHOL, HDL, LDLCALC, TRIG, CHOLHDL, LDLDIRECT in the last 72 hours. Thyroid Function Tests: No results for input(s): TSH, T4TOTAL, FREET4, T3FREE, THYROIDAB in the last 72 hours. Anemia Panel: No results for input(s): VITAMINB12, FOLATE, FERRITIN, TIBC, IRON, RETICCTPCT in the last 72 hours. Sepsis Labs: No results for input(s): PROCALCITON, LATICACIDVEN in the last 168 hours.  Recent Results (from the past 240 hour(s))  Culture, blood (routine x 2)     Status: None (Preliminary result)   Collection Time: 01/30/18  5:18 PM  Result Value Ref Range Status   Specimen Description BLOOD RIGHT HAND  Final   Special Requests   Final    BOTTLES DRAWN AEROBIC ONLY Blood Culture results may not be optimal due to an inadequate volume of blood received in culture bottles   Culture   Final    NO GROWTH 3 DAYS Performed at [facility], [address], [place], [place] [address]    Report Status PENDING  Incomplete  Culture, blood (routine x  2)     Status: None (Preliminary result)   Collection Time: 01/30/18  5:22 PM  Result Value Ref Range Status   Specimen Description BLOOD LEFT HAND  Final   Special Requests   Final    BOTTLES DRAWN AEROBIC AND ANAEROBIC Blood Culture results may not be optimal due to an inadequate volume of blood received in culture bottles   Culture   Final    NO GROWTH 3 DAYS Performed at [facility], [address] [place], [place] [address]    Report Status PENDING  Incomplete  Surgical pcr screen     Status: Abnormal   Collection Time: 02/02/18  5:58 AM  Result Value Ref Range Status   MRSA, PCR NEGATIVE NEGATIVE Final   Staphylococcus aureus POSITIVE (A) NEGATIVE Final    Comment: (NOTE) The Xpert SA Assay (FDA approved for NASAL specimens in patients 32 years of age and older), is one component of a comprehensive surveillance program. It is  not intended to diagnose infection nor to guide or monitor treatment. Performed at [facility], [address], [place], [place] [address]          Radiology Studies: Vas [place] Abi With/wo Tbi  Result Date: 02/01/2018 LOWER EXTREMITY DOPPLER STUDY Indications: Ulceration, and gangrene. Left Great Toe High Risk Factors: Diabetes.  Performing Technologist: Milta Deiters, [place] RV[facility]  Examination Guidelines: A complete evaluation includes at minimum, Doppler waveform signals and systolic blood pressure reading at the level of bilateral brachial, anterior tibial, and posterior tibial arteries, when vessel segments are accessible. Bilateral testing is considered an integral part of a complete examination. Photoelectric Plethysmograph (PPG) waveforms and toe systolic pressure readings are included as required and additional duplex testing as needed. Limited examinations for reoccurring indications may be performed as noted.  ABI Findings: +---------+------------------+-----+---------+--------+ Right    Rt Pressure (mmHg)IndexWaveform Comment   +---------+------------------+-----+---------+--------+ Brachial 124                    triphasic         +---------+------------------+-----+---------+--------+ PTA      152               1.23 triphasic         +---------+------------------+-----+---------+--------+ DP       171               1.38 triphasic         +---------+------------------+-----+---------+--------+ Great Toe140               1.13                   +---------+------------------+-----+---------+--------+ +---------+------------------+-----+---------+----------------+ Left     Lt Pressure (mmHg)IndexWaveform Comment          +---------+------------------+-----+---------+----------------+ Brachial 119                    triphasic                 +---------+------------------+-----+---------+----------------+ PTA      158               1.27 triphasic                 +---------+------------------+-----+---------+----------------+ DP       149               1.20 triphasic                 +---------+------------------+-----+---------+----------------+ Great Toe255               2.06          Non compressible +---------+------------------+-----+---------+----------------+ +-------+-----------+-----------+------------+------------+ ABI/TBIToday's ABIToday's TBIPrevious ABIPrevious TBI +-------+-----------+-----------+------------+------------+ Right  1.38       1.13                                +-------+-----------+-----------+------------+------------+ Left   255        2.06                                +-------+-----------+-----------+------------+------------+  Summary: Right: Resting right ankle-brachial index indicates noncompressible right lower extremity arteries.The right toe-brachial index is normal. Left: Resting left ankle-brachial index is within normal range. No evidence of significant left lower extremity arterial disease. The left toe-brachial index is abnormal.  TBIs are unreliable due to non compressible arteies  *  See table(s) above for measurements and observations.  Electronically signed by Sherald Hess MD on 02/01/2018 at 3:53:03 PM.   Final    Vas [place] Lower Extremity Arterial Duplex  Result Date: 01/31/2018 LOWER EXTREMITY ARTERIAL DUPLEX STUDY Indications: Leg pain. High Risk Factors: Diabetes.  Current ABI: No current study Comparison Study: ABI on 12/06/2016 Performing Technologist: Hongying Cole  Examination Guidelines: A complete evaluation includes B-mode imaging, spectral Doppler, color Doppler, and power Doppler as needed of all accessible portions of each vessel. Bilateral testing is considered an integral part of a complete examination. Limited examinations for reoccurring indications may be performed as noted.  Right Duplex Findings: +-----------+--------+-----+--------+---------+--------+            PSV cm/sRatioStenosisWaveform Comments +-----------+--------+-----+--------+---------+--------+ CFA Prox   125                  biphasic          +-----------+--------+-----+--------+---------+--------+ DFA        74                   biphasic          +-----------+--------+-----+--------+---------+--------+ SFA Prox   86                   triphasic         +-----------+--------+-----+--------+---------+--------+ SFA Mid    100                  triphasic         +-----------+--------+-----+--------+---------+--------+ SFA Distal 106                  triphasic         +-----------+--------+-----+--------+---------+--------+ POP Prox   60                   triphasic         +-----------+--------+-----+--------+---------+--------+ POP Mid                         triphasic         +-----------+--------+-----+--------+---------+--------+ POP Distal 68                   triphasic         +-----------+--------+-----+--------+---------+--------+ ATA Prox   80                   triphasic          +-----------+--------+-----+--------+---------+--------+ ATA Mid    69                   triphasic         +-----------+--------+-----+--------+---------+--------+ ATA Distal 62                   triphasic         +-----------+--------+-----+--------+---------+--------+ PTA Prox   70                   triphasic         +-----------+--------+-----+--------+---------+--------+ PTA Mid    81                   triphasic         +-----------+--------+-----+--------+---------+--------+ PTA Distal 71                   triphasic         +-----------+--------+-----+--------+---------+--------+ PERO Prox  73  triphasic         +-----------+--------+-----+--------+---------+--------+ PERO Distal45                   triphasic         +-----------+--------+-----+--------+---------+--------+ DP         17                   biphasic          +-----------+--------+-----+--------+---------+--------+  Left Duplex Findings: +-----------+--------+-----+--------+---------+--------+            PSV cm/sRatioStenosisWaveform Comments +-----------+--------+-----+--------+---------+--------+ CFA Prox   118                  biphasic          +-----------+--------+-----+--------+---------+--------+ DFA        55                   triphasic         +-----------+--------+-----+--------+---------+--------+ SFA Prox   79                   triphasic         +-----------+--------+-----+--------+---------+--------+ SFA Mid    137                  triphasic         +-----------+--------+-----+--------+---------+--------+ SFA Distal 88                   triphasic         +-----------+--------+-----+--------+---------+--------+ POP Prox   85                   triphasic         +-----------+--------+-----+--------+---------+--------+ POP Distal 113                  triphasic          +-----------+--------+-----+--------+---------+--------+ ATA Prox   136                  triphasic         +-----------+--------+-----+--------+---------+--------+ ATA Mid    109                  biphasic          +-----------+--------+-----+--------+---------+--------+ ATA Distal 95                   triphasic         +-----------+--------+-----+--------+---------+--------+ PTA Prox   96                   biphasic          +-----------+--------+-----+--------+---------+--------+ PTA Mid    67                   biphasic          +-----------+--------+-----+--------+---------+--------+ PTA Distal 77                   biphasic          +-----------+--------+-----+--------+---------+--------+ PERO Prox  42                   biphasic          +-----------+--------+-----+--------+---------+--------+ PERO Mid   46                   triphasic         +-----------+--------+-----+--------+---------+--------+ PERO Distal44  triphasic         +-----------+--------+-----+--------+---------+--------+ DP         88                   biphasic          +-----------+--------+-----+--------+---------+--------+  Summary: Right: Normal examination. No evidence of arterial occlusive disease. Left: Normal examination. No evidence of arterial occlusive disease.  See table(s) above for measurements and observations. Electronically signed by Nanetta Batty MD on 01/31/2018 at 4:45:55 PM.    Final         Scheduled Meds: . docusate sodium  100 mg Oral BID  . enoxaparin (LOVENOX) injection  40 mg Subcutaneous Q24H  . feeding supplement (GLUCERNA SHAKE)  237 mL Oral BID BM  . feeding supplement (PRO-STAT SUGAR FREE 64)  30 mL Oral BID  . insulin aspart  0-15 Units Subcutaneous TID WC  . insulin aspart  0-5 Units Subcutaneous QHS  . insulin aspart  7 Units Subcutaneous TID WC  . insulin glargine  25 Units Subcutaneous BID   Continuous  Infusions: . sodium chloride    .  ceFAZolin (ANCEF) IV    . methocarbamol (ROBAXIN) IV       LOS: 3 days    Time spent: 25 mins.More than 50% of that time was spent in counseling and/or coordination of care.      Burnadette Pop, MD [facility] Pager [phone]  If 7PM-7AM, please contact night-coverage [email or web address] Password [email or web address] 02/02/2018, 11:57 AM

## 2018-02-02 NOTE — Anesthesia Postprocedure Evaluation (Signed)
Anesthesia Post Note  Patient: Jonathan [name]  Procedure(s) Performed: LEFT GREAT TOE AMPUTATION (Left Foot)     Patient location during evaluation: PACU Anesthesia Type: MAC Level of consciousness: awake and alert Pain management: pain level controlled Vital Signs Assessment: post-procedure vital signs reviewed and stable Respiratory status: spontaneous breathing and respiratory function stable Cardiovascular status: stable Postop Assessment: no apparent nausea or vomiting Anesthetic complications: no    Last Vitals:  Vitals:   02/02/18 1108 02/02/18 1117  BP:  119/79  Pulse:  65  Resp:  16  Temp: 36.5 C   SpO2:  99%    Last Pain:  Vitals:   02/02/18 1108  TempSrc:   PainSc: 0-No pain                 Halena Mohar DANIEL

## 2018-02-02 NOTE — Evaluation (Signed)
Physical Therapy Evaluation Patient Details Name: Jonathan [name] MRN: [id number] DOB: 09-Apr-1980 Today's Date: 02/02/2018   History of Present Illness  Pt is a 37 y/o male s/p L great toe amputation secondary to osteomyelitis. PMH including but not limited to DM.  Clinical Impression  Pt presented supine in bed with HOB elevated, awake and willing to participate in therapy session. Prior to admission, pt reported that he was independent with all functional mobility and ADLs. Pt lives in a single level home with four steps to enter (no rail). Pt reported that he could have family/friends to assist intermittently upon d/c. Pt currently able to perform bed mobility with modified independence, transfers with min guard and ambulated with RW and min guard for safety. Pt with one LOB required min A to maintain upright standing. PT will continue to follow pt acutely to progress mobility as tolerated. Will plan for stair training at next session.    Follow Up Recommendations No PT follow up;Supervision for mobility/OOB    Equipment Recommendations  Rolling walker with 5" wheels;Other (comment)(vs crutches pending trial)    Recommendations for Other Services       Precautions / Restrictions Precautions Precautions: Fall(moderate fall risk) Restrictions Weight Bearing Restrictions: Yes LLE Weight Bearing: Touchdown weight bearing      Mobility  Bed Mobility Overal bed mobility: Modified Independent             General bed mobility comments: increased time  Transfers Overall transfer level: Needs assistance Equipment used: Rolling walker (2 wheeled) Transfers: Sit to/from Stand Sit to Stand: Min guard         General transfer comment: cueing and demonstration for safe hand placement and technique, min guard for safety  Ambulation/Gait Ambulation/Gait assistance: Min guard;Min assist Gait Distance (Feet): 25 Feet Assistive device: Rolling walker (2 wheeled) Gait  Pattern/deviations: (hop-to on R LE) Gait velocity: decreased   General Gait Details: pt able to hop-to on R LE while maintaining L LE NWB; pt with one LOB when attempting to change directions that required min A to recover  Stairs            Wheelchair Mobility    Modified Rankin (Stroke Patients Only)       Balance Overall balance assessment: Needs assistance Sitting-balance support: No upper extremity supported Sitting balance-Leahy Scale: Good     Standing balance support: Bilateral upper extremity supported Standing balance-Leahy Scale: Poor Standing balance comment: reliant on bilateral UEs on RW                             Pertinent Vitals/Pain Pain Assessment: No/denies pain    Home Living Family/patient expects to be discharged to:: Private residence Living Arrangements: Alone Available Help at Discharge: Family;Friend(s);Available PRN/intermittently Type of Home: House Home Access: Stairs to enter Entrance Stairs-Rails: None Entrance Stairs-Number of Steps: 4 Home Layout: One level Home Equipment: None      Prior Function Level of Independence: Independent               Hand Dominance        Extremity/Trunk Assessment   Upper Extremity Assessment Upper Extremity Assessment: Overall WFL for tasks assessed    Lower Extremity Assessment Lower Extremity Assessment: Overall WFL for tasks assessed    Cervical / Trunk Assessment Cervical / Trunk Assessment: Normal  Communication   Communication: No difficulties  Cognition Arousal/Alertness: Awake/alert Behavior During Therapy: WFL for tasks assessed/performed Overall Cognitive Status:  Within Functional Limits for tasks assessed                                        General Comments      Exercises     Assessment/Plan    PT Assessment Patient needs continued PT services  PT Problem List Decreased balance;Decreased mobility;Decreased coordination        PT Treatment Interventions DME instruction;Gait training;Stair training;Functional mobility training;Therapeutic activities;Balance training;Neuromuscular re-education;Therapeutic exercise;Patient/family education    PT Goals (Current goals can be found in the Care Plan section)  Acute Rehab PT Goals Patient Stated Goal: return home PT Goal Formulation: With patient Time For Goal Achievement: 02/16/18 Potential to Achieve Goals: Good    Frequency Min 5X/week   Barriers to discharge        Co-evaluation               AM-PAC PT "6 Clicks" Mobility  Outcome Measure Help needed turning from your back to your side while in a flat bed without using bedrails?: None Help needed moving from lying on your back to sitting on the side of a flat bed without using bedrails?: None Help needed moving to and from a bed to a chair (including a wheelchair)?: None Help needed standing up from a chair using your arms (e.g., wheelchair or bedside chair)?: None Help needed to walk in hospital room?: A Little Help needed climbing 3-5 steps with a railing? : A Little 6 Click Score: 22    End of Session Equipment Utilized During Treatment: Gait belt Activity Tolerance: Patient tolerated treatment well Patient left: in chair;with call bell/phone within reach Nurse Communication: Mobility status PT Visit Diagnosis: Other abnormalities of gait and mobility (R26.89)    Time: 1529-1550 PT Time Calculation (min) (ACUTE ONLY): 21 min   Charges:   PT Evaluation $PT Eval Moderate Complexity: 1 Mod          Deborah ChalkJennifer Shagun Wordell, PT, DPT  Acute Rehabilitation Services Pager [phone] Office [phone]    Alessandra BevelsJennifer M Adreanne Yono 02/02/2018, 4:30 PM

## 2018-02-02 NOTE — Progress Notes (Signed)
Pt has returned from surgery. Pt alert & oriented; pt not needing pain medicine. Call bell within reach. Ortho tech called for Post op shoe.

## 2018-02-02 NOTE — Op Note (Signed)
01/30/2018 - 02/02/2018  10:36 AM  PATIENT:  Jonathan [name]    PRE-OPERATIVE DIAGNOSIS:  Osteomyelitis Left Great Toe  POST-OPERATIVE DIAGNOSIS:  Same  PROCEDURE:  LEFT GREAT TOE AMPUTATION  SURGEON:  Nadara MustardMarcus V Tatyanna Cronk, MD  PHYSICIAN ASSISTANT:None ANESTHESIA:   General  PREOPERATIVE INDICATIONS:  Jonathan [name] is a  37 y.o. male with a diagnosis of Osteomyelitis Left Great Toe who failed conservative measures and elected for surgical management.    The risks benefits and alternatives were discussed with the patient preoperatively including but not limited to the risks of infection, bleeding, nerve injury, cardiopulmonary complications, the need for revision surgery, among others, and the patient was willing to proceed.  OPERATIVE IMPLANTS: None.  @ENCIMAGES @  OPERATIVE FINDINGS: No abscess at the level of amputation.  Good petechial bleeding.  OPERATIVE PROCEDURE: Patient was brought to the operating room after undergoing a regional block.  After adequate levels anesthesia were obtained patient's left lower extremity was prepped using DuraPrep draped into a sterile field a timeout was called.  A fishmouth incision was made just distal to the MTP joint.  The toe was amputated through the MTP joint.  Electrocautery was used for hemostasis the wound was irrigated with normal saline there was good petechial bleeding there was no abscess no signs of infection at the amputation site.  The incision was closed using 2-0 nylon a sterile dressing was applied patient was taken the PACU in stable condition.   DISCHARGE PLANNING:  Antibiotic duration: Continue antibiotics for 24 hours postoperatively.  Weightbearing: Touchdown weightbearing on the left.  He states that  Pain medication: Opioid pathway ordered.  Dressing care/ Wound VAC: Dry dressing will change the office in 1 week.  Ambulatory devices: Dispensed with walker or crutches.    Discharge to: Patient may discharge to home on  Saturday.  Follow-up: In the office 1 week post operative.

## 2018-02-02 NOTE — Interval H&P Note (Signed)
History and Physical Interval Note:  02/02/2018 8:14 AM  Jonathan MustacheIbrahim Dalzell  has presented today for surgery, with the diagnosis of Osteomyelitis Left Great Toe  The various methods of treatment have been discussed with the patient and family. After consideration of risks, benefits and other options for treatment, the patient has consented to  Procedure(s): LEFT GREAT TOE AMPUTATION (Left) as a surgical intervention .  The patient's history has been reviewed, patient examined, no change in status, stable for surgery.  I have reviewed the patient's chart and labs.  Questions were answered to the patient's satisfaction.     H. J. HeinzAYBURN,SHAWN,PA-C [facility] [phone]

## 2018-02-02 NOTE — Progress Notes (Signed)
CSW acknowledging consults for patient having difficulty accessing and affording his medications. Inappropriate consult; CSW does not assist with medications at discharge. RNCM aware, is assisting patient.  CSW signing off.  Blenda NicelyElizabeth Narada Uzzle, Kentucky[place]CS[place] Clinical Social Worker [phone]

## 2018-02-02 NOTE — Anesthesia Procedure Notes (Signed)
Procedure Name: MAC Date/Time: 02/02/2018 10:20 AM Performed by: Lieutenant Diego, CRNA Pre-anesthesia Checklist: Patient identified, Emergency Drugs available, Suction available, Patient being monitored and Timeout performed Patient Re-evaluated:Patient Re-evaluated prior to induction Oxygen Delivery Method: Simple face mask Preoxygenation: Pre-oxygenation with 100% oxygen Induction Type: IV induction

## 2018-02-02 NOTE — Transfer of Care (Signed)
Immediate Anesthesia Transfer of Care Note  Patient: Jonathan [name]  Procedure(s) Performed: LEFT GREAT TOE AMPUTATION (Left Foot)  Patient Location: PACU  Anesthesia Type:MAC  Level of Consciousness: awake and alert   Airway & Oxygen Therapy: Patient Spontanous Breathing and Patient connected to face mask oxygen  Post-op Assessment: Report given to RN and Post -op Vital signs reviewed and stable  Post vital signs: Reviewed and stable  Last Vitals:  Vitals Value Taken Time  BP    Temp    Pulse 66 02/02/2018 10:47 AM  Resp 14 02/02/2018 10:47 AM  SpO2 98 % 02/02/2018 10:47 AM  Vitals shown include unvalidated device data.  Last Pain:  Vitals:   02/02/18 1047  TempSrc:   PainSc: (P) 0-No pain      Patients Stated Pain Goal: 0 (09/81/19 1478)  Complications: No apparent anesthesia complications

## 2018-02-02 NOTE — Anesthesia Preprocedure Evaluation (Signed)
Anesthesia Evaluation  Patient identified by MRN, date of birth, ID band Patient awake    Reviewed: Allergy & Precautions, Patient's Chart, lab work & pertinent test results  History of Anesthesia Complications Negative for: history of anesthetic complications  Airway Mallampati: II  TM Distance: >3 FB Neck ROM: Full    Dental no notable dental hx.    Pulmonary neg pulmonary ROS,    Pulmonary exam normal        Cardiovascular negative cardio ROS Normal cardiovascular exam     Neuro/Psych negative neurological ROS  negative psych ROS   GI/Hepatic negative GI ROS, Neg liver ROS,   Endo/Other  negative endocrine ROSdiabetes, Poorly Controlled, Type 2, Insulin Dependent, Oral Hypoglycemic Agents  Renal/GU negative Renal ROS  negative genitourinary   Musculoskeletal negative musculoskeletal ROS (+)   Abdominal   Peds  Hematology negative hematology ROS (+)   Anesthesia Other Findings   Reproductive/Obstetrics                             Anesthesia Physical Anesthesia Plan  ASA: III  Anesthesia Plan: MAC   Post-op Pain Management:    Induction:   PONV Risk Score and Plan: 1 and Propofol infusion and Treatment may vary due to age or medical condition  Airway Management Planned: Nasal Cannula and Simple Face Mask  Additional Equipment: None  Intra-op Plan:   Post-operative Plan:   Informed Consent: I have reviewed the patients History and Physical, chart, labs and discussed the procedure including the risks, benefits and alternatives for the proposed anesthesia with the patient or authorized representative who has indicated his/her understanding and acceptance.     Plan Discussed with:   Anesthesia Plan Comments:         Anesthesia Quick Evaluation

## 2018-02-03 ENCOUNTER — Encounter (HOSPITAL_COMMUNITY): Payer: Self-pay | Admitting: Orthopedic Surgery

## 2018-02-03 DIAGNOSIS — M869 Osteomyelitis, unspecified: Secondary | ICD-10-CM

## 2018-02-03 LAB — GLUCOSE, CAPILLARY
Glucose-Capillary: 187 mg/dL — ABNORMAL HIGH (ref 70–99)
Glucose-Capillary: 278 mg/dL — ABNORMAL HIGH (ref 70–99)
Glucose-Capillary: 227 mg/dL — ABNORMAL HIGH (ref 70–99)

## 2018-02-03 MED ORDER — INSULIN ASPART PROT & ASPART (70-30 MIX) 100 UNIT/ML ~~LOC~~ SUSP: 35.0000 [IU] | Freq: Two times a day (BID) | SUBCUTANEOUS | 0 refills | Status: DC

## 2018-02-03 MED ORDER — BLOOD GLUCOSE METER KIT: PACK | 0 refills | Status: AC

## 2018-02-03 NOTE — Progress Notes (Signed)
Subjective: 1 Day Post-Op Procedure(s) (LRB): LEFT GREAT TOE AMPUTATION (Left) Patient reports pain as mild.    Objective: Vital signs in last 24 hours: Temp:  [97.6 F (36.4 C)-98.2 F (36.8 C)] 98 F (36.7 C) (12/07 0759) Pulse Rate:  [61-85] 78 (12/07 0759) Resp:  [14-18] 18 (12/07 0759) BP: (104-124)/(63-79) 117/72 (12/07 0759) SpO2:  [97 %-100 %] 99 % (12/07 0759) Weight:  [621[108 kg] 108 kg (12/06 0915)  Intake/Output from previous day: 12/06 0701 - 12/07 0700 In: 900 [P.O.:600; I.V.:300] Out: 230 [Urine:225; Blood:5] Intake/Output this shift: No intake/output data recorded.  Recent Labs    02/01/18 0422  HGB 13.9   Recent Labs    02/01/18 0422  WBC 4.5  RBC 5.70  HCT 45.7  PLT 166   Recent Labs    02/01/18 0422  NA 140  K 3.7  CL 106  CO2 26  BUN 5*  CREATININE 0.59*  GLUCOSE 156*  CALCIUM 8.7*   No results for input(s): LABPT, INR in the last 72 hours.  Left foot dressing intact . wiggles residual toes and intact to light touch    Assessment/Plan: 1 Day Post-Op Procedure(s) (LRB): LEFT GREAT TOE AMPUTATION (Left) May be up with walker and touch down weight bear through left heel for balance in post op shoe. Elevate the left foot as much as possible.  Follow up in office next week.  Do not change or remove dressings. We will remove in the office next week.  Ready for DC home from ortho standpoint.    [employer] Business MachinesAYBURN,Calil Amor,PA-C [facility] [phone]

## 2018-02-03 NOTE — Discharge Summary (Signed)
Physician Discharge Summary  Tavious Griesinger ZTI:[id number] DOB: 1980-08-22 DOA: 01/30/2018  PCP: Nolene Ebbs, MD  Admit date: 01/30/2018 Discharge date: 02/03/2018  Admitted From: Home Disposition:  Home  Discharge Condition:Stable CODE STATUS:FULL Diet recommendation:  Carb Modified  Brief/Interim Summary: Patient is a 37 year old male with past medical history of diabetes type II on insulin who presented to the emergency department with complaints of left great toe pain and swelling.  Patient reported that he had to walk 8 to 10 miles a day for his job. It was noted to be red, swollen and tender on presentation.  There was drainage of pus and it was foul-smelling.  He was admitted in October 2018 with cellulitis of the same toe.  He underwent debridement through podiatry.  No report of fever or chills at home.  CT done here suggestive of osteomyelitis involving the plantar cortex of the left great toe distal phalanx.  Orthopedics consulted and he underwent amputation of the left great toe. Plan for discharge  Home today.  Following problems were addressed during his hospitalization:  Left great toe osteomyelitis: Status post amputation of the left great toe by orthopedics.  was on cefazolin until discharge.  No further antibiotics on discharge. Hemodynamically stable.  Afebrile.  No signs of sepsis.  Ordered arterial duplex to rule out peripheral artery disease but it came out to be negative. He will follow-up with Dr. Sharol Given in a week for dressing.  Diabetes type 2: On Lantus 30 units nightly at home.  Does not follow with  PCP as an outpatient.  Hemoglobin A1c of 12.  Diabetic coordinator was following.  His insulin was changed to Novolin 70/30 35 units twice daily.  Continue  metformin at home.  Depression/anxiety: Continue home trazodone, Atarax    Discharge Diagnoses:  Principal Problem:   Osteomyelitis of great toe of left foot (HCC) Active Problems:   DM type 2,  uncontrolled, with neuropathy ([place])   Diabetic ulcer of left foot ([place])   Depression with anxiety    Discharge Instructions  Discharge Instructions    Diet Carb Modified   Complete by:  As directed    Discharge instructions   Complete by:  As directed    1) Please monitor your blood sugars at home.  We have changed the insulin to Novolin.  Take 35 units twice a day.  Stop Lantus.  Check hemoglobinA1 C in 3 months.  Continue metformin 1 g twice daily. 2) Follow up with orthopedics, Dr. Sharol Given, in a week. 3) Follow up with your PCP as an outpatient.   Increase activity slowly   Complete by:  As directed    Touch down weight bearing   Complete by:  As directed    Laterality:  left   Extremity:  Lower     Allergies as of 02/03/2018   No Known Allergies     Medication List    STOP taking these medications   BASAGLAR KWIKPEN 100 UNIT/ML Sopn   glipiZIDE 10 MG 24 hr tablet Commonly known as:  GLUCOTROL XL   hydrOXYzine 25 MG tablet Commonly known as:  ATARAX/VISTARIL   traZODone 50 MG tablet Commonly known as:  DESYREL     TAKE these medications   atorvastatin 40 MG tablet Commonly known as:  LIPITOR Take 40 mg by mouth daily.   blood glucose meter kit and supplies Dispense based on patient and insurance preference. Use up to four times daily as directed. (FOR ICD-10 E10.9, E11.9).   insulin  aspart protamine- aspart (70-30) 100 UNIT/ML injection Commonly known as:  NOVOLOG MIX 70/30 Inject 0.35 mLs (35 Units total) into the skin 2 (two) times daily with a meal.   metFORMIN 1000 MG tablet Commonly known as:  GLUCOPHAGE Take 1 tablet (1,000 mg total) by mouth 2 (two) times daily.            Discharge Care Instructions  (From admission, onward)         Start     Ordered   02/02/18 0000  Touch down weight bearing    Question Answer Comment  Laterality left   Extremity Lower      02/02/18 1033         Follow-up Information    Newt Minion, MD In 1  week.   Specialty:  Orthopedic Surgery Contact information: [place] [address] [phone]        [facility] Follow up on 02/16/2018.   Why:  Your appt time is 1 pm. please arrive 15 min early. please bring picture ID and your current medications. Contact information: [address] [place], [place] [address]  [phone]) [facility].   Why:  Please use this location for your pharmacy needs.  Contact information: [place] [address] [phone]         No Known Allergies  Consultations:  Orthopedics   Procedures/Studies: Ct Foot Left W Contrast  Result Date: 01/30/2018 CLINICAL DATA:  Diabetic ulcer involving the plantar aspect of the great toe. EXAM: CT OF THE LOWER LEFT EXTREMITY WITH CONTRAST TECHNIQUE: Multidetector CT imaging of the lower left extremity was performed according to the standard protocol following intravenous contrast administration. COMPARISON:  MRI 12/26/2016 CONTRAST:  144m ISOVUE-300 IOPAMIDOL (ISOVUE-300) INJECTION 61% FINDINGS: There is an open wound involving the plantar aspect of the great toe extending from the IV mid proximal phalanx to the mid distal phalanx. There is a scalloped appearance involving the plantar cortex of the distal phalanx very worrisome for osteomyelitis. Diffuse severe cellulitis involving the great toe but no discrete drainable soft tissue abscess is identified. The other bony structures are intact. No other areas of osteomyelitis are suspected. IMPRESSION: Open wound on the plantar aspect of the great toe with CT findings highly suspicious for osteomyelitis involving the plantar cortex of the distal phalanx. Cellulitis without discrete drainable soft tissue abscess. Electronically Signed   By: PMarijo SanesM.D.   On: 01/30/2018 15:18   Vas UKoreaABurnard BuntingWith/wo Tbi  Result Date: 02/01/2018 LOWER  EXTREMITY DOPPLER STUDY Indications: Ulceration, and gangrene. Left Great Toe High Risk Factors: Diabetes.  Performing Technologist: SBirdena Crandall VVermontRVS  Examination Guidelines: A complete evaluation includes at minimum, Doppler waveform signals and systolic blood pressure reading at the level of bilateral brachial, anterior tibial, and posterior tibial arteries, when vessel segments are accessible. Bilateral testing is considered an integral part of a complete examination. Photoelectric Plethysmograph (PPG) waveforms and toe systolic pressure readings are included as required and additional duplex testing as needed. Limited examinations for reoccurring indications may be performed as noted.  ABI Findings: +---------+------------------+-----+---------+--------+ Right    Rt Pressure (mmHg)IndexWaveform Comment  +---------+------------------+-----+---------+--------+ Brachial 124                    triphasic         +---------+------------------+-----+---------+--------+ PTA      152  1.23 triphasic         +---------+------------------+-----+---------+--------+ DP       171               1.38 triphasic         +---------+------------------+-----+---------+--------+ Great Toe140               1.13                   +---------+------------------+-----+---------+--------+ +---------+------------------+-----+---------+----------------+ Left     Lt Pressure (mmHg)IndexWaveform Comment          +---------+------------------+-----+---------+----------------+ Brachial 119                    triphasic                 +---------+------------------+-----+---------+----------------+ PTA      158               1.27 triphasic                 +---------+------------------+-----+---------+----------------+ DP       149               1.20 triphasic                 +---------+------------------+-----+---------+----------------+ Great Toe255               2.06           Non compressible +---------+------------------+-----+---------+----------------+ +-------+-----------+-----------+------------+------------+ ABI/TBIToday's ABIToday's TBIPrevious ABIPrevious TBI +-------+-----------+-----------+------------+------------+ Right  1.38       1.13                                +-------+-----------+-----------+------------+------------+ Left   255        2.06                                +-------+-----------+-----------+------------+------------+  Summary: Right: Resting right ankle-brachial index indicates noncompressible right lower extremity arteries.The right toe-brachial index is normal. Left: Resting left ankle-brachial index is within normal range. No evidence of significant left lower extremity arterial disease. The left toe-brachial index is abnormal. TBIs are unreliable due to non compressible arteies  *See table(s) above for measurements and observations.  Electronically signed by Monica Martinez MD on 02/01/2018 at 3:53:03 PM.   Final    Vas [place] Lower Extremity Arterial Duplex  Result Date: 01/31/2018 LOWER EXTREMITY ARTERIAL DUPLEX STUDY Indications: Leg pain. High Risk Factors: Diabetes.  Current ABI: No current study Comparison Study: ABI on 12/06/2016 Performing Technologist: Hongying Cole  Examination Guidelines: A complete evaluation includes B-mode imaging, spectral Doppler, color Doppler, and power Doppler as needed of all accessible portions of each vessel. Bilateral testing is considered an integral part of a complete examination. Limited examinations for reoccurring indications may be performed as noted.  Right Duplex Findings: +-----------+--------+-----+--------+---------+--------+            PSV cm/sRatioStenosisWaveform Comments +-----------+--------+-----+--------+---------+--------+ CFA Prox   125                  biphasic          +-----------+--------+-----+--------+---------+--------+ DFA        74                    biphasic          +-----------+--------+-----+--------+---------+--------+ SFA Prox   86  triphasic         +-----------+--------+-----+--------+---------+--------+ SFA Mid    100                  triphasic         +-----------+--------+-----+--------+---------+--------+ SFA Distal 106                  triphasic         +-----------+--------+-----+--------+---------+--------+ POP Prox   60                   triphasic         +-----------+--------+-----+--------+---------+--------+ POP Mid                         triphasic         +-----------+--------+-----+--------+---------+--------+ POP Distal 68                   triphasic         +-----------+--------+-----+--------+---------+--------+ ATA Prox   80                   triphasic         +-----------+--------+-----+--------+---------+--------+ ATA Mid    69                   triphasic         +-----------+--------+-----+--------+---------+--------+ ATA Distal 62                   triphasic         +-----------+--------+-----+--------+---------+--------+ PTA Prox   70                   triphasic         +-----------+--------+-----+--------+---------+--------+ PTA Mid    81                   triphasic         +-----------+--------+-----+--------+---------+--------+ PTA Distal 71                   triphasic         +-----------+--------+-----+--------+---------+--------+ PERO Prox  73                   triphasic         +-----------+--------+-----+--------+---------+--------+ PERO Distal45                   triphasic         +-----------+--------+-----+--------+---------+--------+ DP         17                   biphasic          +-----------+--------+-----+--------+---------+--------+  Left Duplex Findings: +-----------+--------+-----+--------+---------+--------+            PSV cm/sRatioStenosisWaveform Comments  +-----------+--------+-----+--------+---------+--------+ CFA Prox   118                  biphasic          +-----------+--------+-----+--------+---------+--------+ DFA        55                   triphasic         +-----------+--------+-----+--------+---------+--------+ SFA Prox   79                   triphasic         +-----------+--------+-----+--------+---------+--------+ SFA Mid    137  triphasic         +-----------+--------+-----+--------+---------+--------+ SFA Distal 88                   triphasic         +-----------+--------+-----+--------+---------+--------+ POP Prox   85                   triphasic         +-----------+--------+-----+--------+---------+--------+ POP Distal 113                  triphasic         +-----------+--------+-----+--------+---------+--------+ ATA Prox   136                  triphasic         +-----------+--------+-----+--------+---------+--------+ ATA Mid    109                  biphasic          +-----------+--------+-----+--------+---------+--------+ ATA Distal 95                   triphasic         +-----------+--------+-----+--------+---------+--------+ PTA Prox   96                   biphasic          +-----------+--------+-----+--------+---------+--------+ PTA Mid    67                   biphasic          +-----------+--------+-----+--------+---------+--------+ PTA Distal 77                   biphasic          +-----------+--------+-----+--------+---------+--------+ PERO Prox  42                   biphasic          +-----------+--------+-----+--------+---------+--------+ PERO Mid   46                   triphasic         +-----------+--------+-----+--------+---------+--------+ PERO Distal44                   triphasic         +-----------+--------+-----+--------+---------+--------+ DP         88                   biphasic           +-----------+--------+-----+--------+---------+--------+  Summary: Right: Normal examination. No evidence of arterial occlusive disease. Left: Normal examination. No evidence of arterial occlusive disease.  See table(s) above for measurements and observations. Electronically signed by Quay Burow MD on 01/31/2018 at 4:45:55 PM.    Final        Subjective: Patient seen and examined the bedside this morning.  Remains comfortable.  Stable for discharge home today.  Discharge Exam: Vitals:   02/03/18 0406 02/03/18 0759  BP: 120/74 117/72  Pulse: 72 78  Resp: 18 18  Temp: 98.2 F (36.8 C) 98 F (36.7 C)  SpO2: 100% 99%   Vitals:   02/02/18 2005 02/03/18 0000 02/03/18 0406 02/03/18 0759  BP: 120/75 117/72 120/74 117/72  Pulse: 85 77 72 78  Resp: '18 18 18 18  ' Temp: 98.2 F (36.8 C) 98 F (36.7 C) 98.2 F (36.8 C) 98 F (36.7 C)  TempSrc: Oral Oral Oral Oral  SpO2: 100%  100% 100% 99%  Weight:      Height:        General: Pt is alert, awake, not in acute distress Cardiovascular: RRR, S1/S2 +, no rubs, no gallops Respiratory: CTA bilaterally, no wheezing, no rhonchi Abdominal: Soft, NT, ND, bowel sounds + Extremities: no edema, no cyanosis    The results of significant diagnostics from this hospitalization (including imaging, microbiology, ancillary and laboratory) are listed below for reference.     Microbiology: Recent Results (from the past 240 hour(s))  Culture, blood (routine x 2)     Status: None (Preliminary result)   Collection Time: 01/30/18  5:18 PM  Result Value Ref Range Status   Specimen Description BLOOD RIGHT HAND  Final   Special Requests   Final    BOTTLES DRAWN AEROBIC ONLY Blood Culture results may not be optimal due to an inadequate volume of blood received in culture bottles   Culture   Final    NO GROWTH 3 DAYS Performed at [facility], [place] [address], [place], [place] [address]    Report Status PENDING  Incomplete  Culture, blood  (routine x 2)     Status: None (Preliminary result)   Collection Time: 01/30/18  5:22 PM  Result Value Ref Range Status   Specimen Description BLOOD LEFT HAND  Final   Special Requests   Final    BOTTLES DRAWN AEROBIC AND ANAEROBIC Blood Culture results may not be optimal due to an inadequate volume of blood received in culture bottles   Culture   Final    NO GROWTH 3 DAYS Performed at [facility], [place] [address] [place], [place] [address]    Report Status PENDING  Incomplete  Surgical pcr screen     Status: Abnormal   Collection Time: 02/02/18  5:58 AM  Result Value Ref Range Status   MRSA, PCR NEGATIVE NEGATIVE Final   Staphylococcus aureus POSITIVE (A) NEGATIVE Final    Comment: (NOTE) The Xpert SA Assay (FDA approved for NASAL specimens in patients 59 years of age and older), is one component of a comprehensive surveillance program. It is not intended to diagnose infection nor to guide or monitor treatment. Performed at [facility], [place] [address] [place],  [address]      Labs: BNP (last 3 results) No results for input(s): BNP in the last 8760 hours. Basic Metabolic Panel: Recent Labs  Lab 01/30/18 1217 01/31/18 0551 02/01/18 0422  NA 136 137 140  K 4.0 3.7 3.7  CL 101 100 106  CO2 '26 26 26  ' GLUCOSE 314* 310* 156*  BUN <5* 6 5*  CREATININE 0.80 0.62 0.59*  CALCIUM 9.4 9.0 8.7*   Liver Function Tests: No results for input(s): AST, ALT, ALKPHOS, BILITOT, PROT, ALBUMIN in the last 168 hours. No results for input(s): LIPASE, AMYLASE in the last 168 hours. No results for input(s): AMMONIA in the last 168 hours. CBC: Recent Labs  Lab 01/30/18 1217 01/31/18 0551 02/01/18 0422  WBC 4.4 4.0 4.5  NEUTROABS  --   --  1.4*  HGB 13.9 13.6 13.9  HCT 47.3 43.9 45.7  MCV 80.9 79.4* 80.2  PLT 192 167 166   Cardiac Enzymes: No results for input(s): CKTOTAL, CKMB, CKMBINDEX, TROPONINI in the last 168 hours. BNP: Invalid input(s):  POCBNP CBG: Recent Labs  Lab 02/02/18 1122 02/02/18 1647 02/02/18 2122 02/03/18 0616 02/03/18 1213  GLUCAP 132* 214* 227* 187* 278*   D-Dimer No results for  input(s): DDIMER in the last 72 hours. Hgb A1c No results for input(s): HGBA1C in the last 72 hours. Lipid Profile No results for input(s): CHOL, HDL, LDLCALC, TRIG, CHOLHDL, LDLDIRECT in the last 72 hours. Thyroid function studies No results for input(s): TSH, T4TOTAL, T3FREE, THYROIDAB in the last 72 hours.  Invalid input(s): FREET3 Anemia work up No results for input(s): VITAMINB12, FOLATE, FERRITIN, TIBC, IRON, RETICCTPCT in the last 72 hours. Urinalysis    Component Value Date/Time   COLORURINE YELLOW 12/05/2016 2123   APPEARANCEUR CLEAR 12/05/2016 2123   LABSPEC 1.028 12/05/2016 2123   PHURINE 5.0 12/05/2016 2123   GLUCOSEU >=500 (A) 12/05/2016 2123   HGBUR NEGATIVE 12/05/2016 2123   BILIRUBINUR NEGATIVE 12/05/2016 2123   BILIRUBINUR negative 08/05/2015 1010   KETONESUR 5 (A) 12/05/2016 2123   PROTEINUR NEGATIVE 12/05/2016 2123   UROBILINOGEN 0.2 08/05/2015 1010   UROBILINOGEN 0.2 01/06/2014 0118   NITRITE NEGATIVE 12/05/2016 2123   LEUKOCYTESUR NEGATIVE 12/05/2016 2123   Sepsis Labs Invalid input(s): PROCALCITONIN,  WBC,  LACTICIDVEN Microbiology Recent Results (from the past 240 hour(s))  Culture, blood (routine x 2)     Status: None (Preliminary result)   Collection Time: 01/30/18  5:18 PM  Result Value Ref Range Status   Specimen Description BLOOD RIGHT HAND  Final   Special Requests   Final    BOTTLES DRAWN AEROBIC ONLY Blood Culture results may not be optimal due to an inadequate volume of blood received in culture bottles   Culture   Final    NO GROWTH 3 DAYS Performed at [facility], [place] [address], [place], [place] [address]    Report Status PENDING  Incomplete  Culture, blood (routine x 2)     Status: None (Preliminary result)   Collection Time: 01/30/18  5:22 PM  Result Value Ref  Range Status   Specimen Description BLOOD LEFT HAND  Final   Special Requests   Final    BOTTLES DRAWN AEROBIC AND ANAEROBIC Blood Culture results may not be optimal due to an inadequate volume of blood received in culture bottles   Culture   Final    NO GROWTH 3 DAYS Performed at [facility], [place] [address], [place], [place] [address]    Report Status PENDING  Incomplete  Surgical pcr screen     Status: Abnormal   Collection Time: 02/02/18  5:58 AM  Result Value Ref Range Status   MRSA, PCR NEGATIVE NEGATIVE Final   Staphylococcus aureus POSITIVE (A) NEGATIVE Final    Comment: (NOTE) The Xpert SA Assay (FDA approved for NASAL specimens in patients 95 years of age and older), is one component of a comprehensive surveillance program. It is not intended to diagnose infection nor to guide or monitor treatment. Performed at [facility], [place] [address], [place], [place] [address]     Please note: You were cared for by a hospitalist during your hospital stay. Once you are discharged, your primary care physician will handle any further medical issues. Please note that NO REFILLS for any discharge medications will be authorized once you are discharged, as it is imperative that you return to your primary care physician (or establish a relationship with a primary care physician if you do not have one) for your post hospital discharge needs so that they can reassess your need for medications and monitor your lab values.    Time coordinating discharge: 40 minutes  SIGNED:   Shelly Coss, MD  [facility] 02/03/2018, 1:16 PM Pager [phone]  If 7PM-7AM, please contact night-coverage [email or web address] Password [email or web address]

## 2018-02-03 NOTE — Discharge Instructions (Signed)
May be up with walker and touch down weight bear through left heel for balance in post op shoe. Elevate the left foot as much as possible.  Follow up in office next week.  Do not change or remove dressings. We will remove in the office next week.

## 2018-02-03 NOTE — Progress Notes (Signed)
Patient provided with MATCH letter.  

## 2018-02-03 NOTE — Progress Notes (Signed)
NURSING PROGRESS NOTE  Jonathan [name] [id number] Discharge Data: 02/03/2018 6:12 PM Attending Provider: Shelly Coss, MD JFH:[id number], Christean Grief, MD     Denice Bors to be D/C'd Home per MD order.  Discussed with the patient the After Visit Summary and all questions fully answered. All IV's discontinued with no bleeding noted. All belongings returned to patient for patient to take home.   Last Vital Signs:  Blood pressure 128/68, pulse 91, temperature 98.9 F (37.2 C), temperature source Oral, resp. rate 18, height '6\' 8"'  (2.032 m), weight 108 kg, SpO2 100 %.  Discharge Medication List Allergies as of 02/03/2018   No Known Allergies     Medication List    STOP taking these medications   BASAGLAR KWIKPEN 100 UNIT/ML Sopn   glipiZIDE 10 MG 24 hr tablet Commonly known as:  GLUCOTROL XL   hydrOXYzine 25 MG tablet Commonly known as:  ATARAX/VISTARIL   traZODone 50 MG tablet Commonly known as:  DESYREL     TAKE these medications   atorvastatin 40 MG tablet Commonly known as:  LIPITOR Take 40 mg by mouth daily.   blood glucose meter kit and supplies Dispense based on patient and insurance preference. Use up to four times daily as directed. (FOR ICD-10 E10.9, E11.9).   insulin aspart protamine- aspart (70-30) 100 UNIT/ML injection Commonly known as:  NOVOLOG MIX 70/30 Inject 0.35 mLs (35 Units total) into the skin 2 (two) times daily with a meal.   metFORMIN 1000 MG tablet Commonly known as:  GLUCOPHAGE Take 1 tablet (1,000 mg total) by mouth 2 (two) times daily.            Durable Medical Equipment  (From admission, onward)         Start     Ordered   02/03/18 1649  For home use only DME Walker rolling  Once    Question:  Patient needs a walker to treat with the following condition  Answer:  Weakness   02/03/18 1649           Discharge Care Instructions  (From admission, onward)         Start     Ordered   02/02/18 0000  Touch down weight bearing     Question Answer Comment  Laterality left   Extremity Lower      02/02/18 1033

## 2018-02-03 NOTE — Progress Notes (Signed)
PT Cancellation Note  Patient Details Name: Jonathan [name] MRN: [id number] DOB: 07-31-1980   Cancelled Treatment:    Reason Eval/Treat Not Completed: Patient declined, no reason specified. Pt refusing PT at this time secondary to a "very bad headache" and "feeling like I'm on cloud 9" after receiving IV pain meds. PT will continue to follow acutely as available and appropriate.    [name] 02/03/2018, 2:09 PM

## 2018-02-03 NOTE — Progress Notes (Signed)
Inpatient Diabetes Program Recommendations  AACE/ADA: New Consensus Statement on Inpatient Glycemic Control (2019)  Target Ranges:  Prepandial:   less than 140 mg/dL      Peak postprandial:   less than 180 mg/dL (1-2 hours)      Critically ill patients:  140 - 180 mg/dL   Results for Jonathan [name], Jonathan [name] (MRN [id number]) as of 02/03/2018 12:03  Ref. Range 02/02/2018 08:25 02/02/2018 10:52 02/02/2018 11:22 02/02/2018 16:47 02/02/2018 21:22 02/03/2018 06:16  Glucose-Capillary Latest Ref Range: 70 - 99 mg/dL 409153 (H) 811130 (H) 914132 (H) 214 (H) 227 (H) 187 (H)   Review of Glycemic Control  Diabetes history: DM2 Outpatient Diabetes medications: Basaglar 30 units QHS, Metformin 1000 mg BID, Glipizide 10 mg QAM Current orders for Inpatient glycemic control: Lantus 25 units BID, Novolog 7 units TID with meals, Novolog 0-15 units TID with meals, Novolog 0-5 units QHS  NOTE: MD paged regarding discharge plan for DM control. Chart reviewed. Noted Diabetes Coordinator and he would be losing his Glide medication assistance at the end of the month. CM aware and following and was planning to get [facility] appointment [facility] for follow up. Due to patient losing his medication assistance in a couple of weeks, recommend discharging patient on an affordable insulin regimen using NOVOLIN 70/30 35 units BID (will provide a total of 49 units for basal and 21 units for meal coverage per day).   Therefore at time of discharge recommend discharging on NOVOLIN 70/30 35 units BID and Metformin 1000 mg BID for outpatient DM control.  Thanks, Jonathan PennerMarie Tyjuan Demetro, RN, MSN, CDE Diabetes Coordinator Inpatient Diabetes Program [phone] (Team Pager from 8am to 5pm)

## 2018-02-04 LAB — CULTURE, BLOOD (ROUTINE X 2)
Culture: NO GROWTH
Culture: NO GROWTH

## 2018-02-05 MED FILL — TRUEplus LANCETS 28G MISC: 25 days supply | Qty: 100 | Fill #0

## 2018-02-05 MED FILL — TRUE METRIX TEST STRIP: 25 days supply | Qty: 100 | Fill #0

## 2018-02-05 MED FILL — !TRUE METRIX BLOOD GLUCOSE: 1 days supply | Qty: 1 | Fill #0

## 2018-02-05 MED FILL — NOVOLOG MIX 70-30 FLEXPEN S: (70-30) 100 | 30 days supply | Qty: 21 | Fill #0

## 2018-02-13 ENCOUNTER — Inpatient Hospital Stay (INDEPENDENT_AMBULATORY_CARE_PROVIDER_SITE_OTHER): Payer: Self-pay | Admitting: Orthopedic Surgery

## 2018-02-16 ENCOUNTER — Encounter: Payer: Self-pay | Admitting: Family Medicine

## 2018-02-16 ENCOUNTER — Ambulatory Visit (INDEPENDENT_AMBULATORY_CARE_PROVIDER_SITE_OTHER): Payer: Self-pay | Admitting: Family Medicine

## 2018-02-16 VITALS — BP 120/80 | HR 74 | Temp 98.0°F | Ht >= 80 in | Wt 231.0 lb

## 2018-02-16 DIAGNOSIS — Z09 Encounter for follow-up examination after completed treatment for conditions other than malignant neoplasm: Secondary | ICD-10-CM

## 2018-02-16 DIAGNOSIS — Z794 Long term (current) use of insulin: Secondary | ICD-10-CM

## 2018-02-16 DIAGNOSIS — N522 Drug-induced erectile dysfunction: Secondary | ICD-10-CM

## 2018-02-16 DIAGNOSIS — S98112A Complete traumatic amputation of left great toe, initial encounter: Secondary | ICD-10-CM

## 2018-02-16 DIAGNOSIS — K05219 Aggressive periodontitis, localized, unspecified severity: Secondary | ICD-10-CM

## 2018-02-16 DIAGNOSIS — K068 Other specified disorders of gingiva and edentulous alveolar ridge: Secondary | ICD-10-CM

## 2018-02-16 DIAGNOSIS — Z0000 Encounter for general adult medical examination without abnormal findings: Secondary | ICD-10-CM

## 2018-02-16 DIAGNOSIS — E118 Type 2 diabetes mellitus with unspecified complications: Secondary | ICD-10-CM

## 2018-02-16 DIAGNOSIS — Z23 Encounter for immunization: Secondary | ICD-10-CM

## 2018-02-16 LAB — POCT URINALYSIS DIP (MANUAL ENTRY)
Bilirubin, UA: NEGATIVE
Blood, UA: NEGATIVE
Glucose, UA: 100 mg/dL — AB
Ketones, POC UA: NEGATIVE mg/dL
Leukocytes, UA: NEGATIVE
Nitrite, UA: NEGATIVE
Protein Ur, POC: NEGATIVE mg/dL
Spec Grav, UA: 1.015 (ref 1.010–1.025)
Urobilinogen, UA: 0.2 E.U./dL
pH, UA: 5.5 (ref 5.0–8.0)

## 2018-02-16 LAB — GLUCOSE, POCT (MANUAL RESULT ENTRY): POC Glucose: 195 mg/dl — AB (ref 70–99)

## 2018-02-16 LAB — POCT GLYCOSYLATED HEMOGLOBIN (HGB A1C): Hemoglobin A1C: 11 % — AB (ref 4.0–5.6)

## 2018-02-16 MED ORDER — AMOXICILLIN 500 MG PO CAPS: 500.0000 mg | ORAL_CAPSULE | Freq: Two times a day (BID) | ORAL | 0 refills | Status: DC

## 2018-02-16 MED ORDER — IBUPROFEN 800 MG PO TABS: 800.0000 mg | ORAL_TABLET | Freq: Three times a day (TID) | ORAL | 2 refills | Status: DC | PRN

## 2018-02-16 MED ORDER — SILDENAFIL CITRATE 100 MG PO TABS: 100.0000 mg | ORAL_TABLET | Freq: Every day | ORAL | 2 refills | Status: DC | PRN

## 2018-02-16 MED FILL — SILDENAFIL CITRATE 100 MG T: 100 | 30 days supply | Qty: 10 | Fill #0

## 2018-02-16 MED FILL — IBUPROFEN 800 MG TABLET: 800 | 10 days supply | Qty: 30 | Fill #0

## 2018-02-16 MED FILL — AMOXICILLIN 500 MG CAPSULE: 500 | 10 days supply | Qty: 20 | Fill #0

## 2018-02-16 NOTE — Progress Notes (Signed)
New Patient--Establish Care  Subjective:    Patient ID: Jonathan [name], male    DOB: 1980/04/09, 37 y.o.   MRN: [id number]   Chief Complaint  Patient presents with  . Establish Care  . Hospitalization Follow-up    HPI  Jonathan [name] is a 37 year old male patient with a past medical history of Diabetes. He is here today for hospital follow up and to establish care.   Current Status: Since his last hospital admission from 01/30/2018-02/03/2018 for Uncontrolled Diabetes, he is doing well with no complaints. He states that blood glucose readings are normally around 150s.  He denies fatigue, frequent urination, blurred vision, excessive hunger, excessive thirst, weight gain, weight loss, and poor wound healing. He is s/p: eft great toe amputation on 02/02/2018. He reports acute, moderate pain on left side of mouth and left ear area, since this morning. He reports intermittent cough since beginning blood pressure medications four years ago. He has c/o erectile dysfunctions.   He denies fevers, chills, fatigue, recent infections, weight loss, and night sweats. He has not had any headaches, visual changes, dizziness, and falls. No chest pain, heart palpitations, and shortness of breath reported. No reports of GI problems such as nausea, vomiting, diarrhea, and constipation. He has no reports of blood in stools, dysuria and hematuria. No depression or anxiety reported.   Review of Systems  Constitutional: Negative.   HENT: Negative.   Eyes: Negative.   Respiratory: Negative.   Cardiovascular: Negative.   Gastrointestinal: Negative.   Endocrine: Negative.   Genitourinary: Negative.   Musculoskeletal: Negative.   Skin: Negative.   Allergic/Immunologic: Negative.   Neurological: Negative.   Hematological: Negative.   Psychiatric/Behavioral: Negative.    Objective:   Physical Exam Vitals signs and nursing note reviewed.  Constitutional:      Appearance: Normal appearance. He is normal weight.    HENT:     Head: Normocephalic and atraumatic.     Right Ear: Tympanic membrane, ear canal and external ear normal.     Left Ear: Tympanic membrane, ear canal and external ear normal.     Nose: Nose normal.     Mouth/Throat:     Mouth: Mucous membranes are moist.     Pharynx: Oropharynx is clear.  Eyes:     Extraocular Movements: Extraocular movements intact.     Conjunctiva/sclera: Conjunctivae normal.     Pupils: Pupils are equal, round, and reactive to light.  Neck:     Musculoskeletal: Normal range of motion and neck supple.  Cardiovascular:     Rate and Rhythm: Normal rate and regular rhythm.     Pulses: Normal pulses.     Heart sounds: Normal heart sounds.  Pulmonary:     Effort: Pulmonary effort is normal.     Breath sounds: Normal breath sounds.  Abdominal:     General: Abdomen is flat. Bowel sounds are normal.     Palpations: Abdomen is soft.  Musculoskeletal: Normal range of motion.     Comments: Left great toe amputation  Skin:    General: Skin is warm and dry.  Neurological:     General: No focal deficit present.     Mental Status: He is alert and oriented to person, place, and time.  Psychiatric:        Mood and Affect: Mood normal.        Behavior: Behavior normal.        Thought Content: Thought content normal.  Judgment: Judgment normal.       Assessment & Plan:   1. Type 2 diabetes mellitus with complication, with long-term current use of insulin (HCC) Improved. Hgb A1c is decreased at 11.0 today, from >15.5 on 07/15/2017. He will continue Metformin as prescribed. He will continue to decrease foods/beverages high in sugars and carbs and follow Heart Healthy or DASH diet. Increase physical activity to at least 30 minutes cardio exercise daily.  - POCT urinalysis dipstick - POCT glucose (manual entry) - Microalbumin, urine - POCT glycosylated hemoglobin (Hb A1C)  2. Drug-induced erectile dysfunction We will initiate Sildenafil today.  -  sildenafil (VIAGRA) 100 MG tablet; Take 1 tablet (100 mg total) by mouth daily as needed for erectile dysfunction.  Dispense: 10 tablet; Refill: 2  3. Gum abscess We will initiate Amoxicillin today.  - amoxicillin (AMOXIL) 500 MG capsule; Take 1 capsule (500 mg total) by mouth 2 (two) times daily.  Dispense: 20 capsule; Refill: 0 - ibuprofen (ADVIL,MOTRIN) 800 MG tablet; Take 1 tablet (800 mg total) by mouth every 8 (eight) hours as needed.  Dispense: 30 tablet; Refill: 2  4. Pain in gums We will prescribe Motrin today.   5. Amputation of left great toe [facility](HCC) He is s/: Great toe amputation on 02/02/2018. He has not had dressing changed. We will refer him to wound care, with appointment on 02/22/2018 @ 0900. Patient is aware.  - Ambulatory referral to Wound Clinic  6. Need for immunization against influenza - Flu Vaccine QUAD 36+ mos IM  7. Healthcare maintenance - CBC with Differential - Comprehensive metabolic panel - TSH - Lipid Panel  8. Follow up He will follow up in 2 months.   Meds ordered this encounter  Medications  . sildenafil (VIAGRA) 100 MG tablet    Sig: Take 1 tablet (100 mg total) by mouth daily as needed for erectile dysfunction.    Dispense:  10 tablet    Refill:  2  . amoxicillin (AMOXIL) 500 MG capsule    Sig: Take 1 capsule (500 mg total) by mouth 2 (two) times daily.    Dispense:  20 capsule    Refill:  0  . ibuprofen (ADVIL,MOTRIN) 800 MG tablet    Sig: Take 1 tablet (800 mg total) by mouth every 8 (eight) hours as needed.    Dispense:  30 tablet    Refill:  2    Referral Orders     Ambulatory referral to Wound Clinic    Raliegh IpNatalie Arianna Haydon,  MSN, [facility] Valley[facility]NP-C Patient [facility] [address]  [place], [place] [id number] [phone]

## 2018-02-16 NOTE — Patient Instructions (Addendum)
**Please keep appointment with wound care center on 02/22/2018**         Ibuprofen tablets and capsules What is this medicine? IBUPROFEN (eye BYOO proe fen) is a non-steroidal anti-inflammatory drug (NSAID). It is used for dental pain, fever, headaches or migraines, osteoarthritis, rheumatoid arthritis, or painful monthly periods. It can also relieve minor aches and pains caused by a cold, flu, or sore throat. This medicine may be used for other purposes; ask your health care provider or pharmacist if you have questions. COMMON BRAND NAME(S): Advil, Advil Junior Strength, Advil Migraine, Genpril, Ibren, IBU, Midol, Midol Cramps and Body Aches, Motrin, Motrin IB, Motrin Junior Strength, Motrin Migraine Pain, Samson-8, Toxicology Saliva Collection What should I tell my health care provider before I take this medicine? They need to know if you have any of these conditions: -cigarette smoker -coronary artery bypass graft (CABG) surgery within the past 2 weeks -drink more than 3 alcohol-containing drinks a day -heart disease -high blood pressure -history of stomach bleeding -kidney disease -liver disease -lung or breathing disease, like asthma -an unusual or allergic reaction to ibuprofen, aspirin, other NSAIDs, other medicines, foods, dyes, or preservatives -pregnant or trying to get pregnant -breast-feeding How should I use this medicine? Take this medicine by mouth with a glass of water. Follow the directions on the prescription label. Take this medicine with food if your stomach gets upset. Try to not lie down for at least 10 minutes after you take the medicine. Take your medicine at regular intervals. Do not take your medicine more often than directed. A special MedGuide will be given to you by the pharmacist with each prescription and refill. Be sure to read this information carefully each time. Talk to your pediatrician regarding the use of this medicine in children. Special care  may be needed. Overdosage: If you think you have taken too much of this medicine contact a poison control center or emergency room at once. NOTE: This medicine is only for you. Do not share this medicine with others. What if I miss a dose? If you miss a dose, take it as soon as you can. If it is almost time for your next dose, take only that dose. Do not take double or extra doses. What may interact with this medicine? Do not take this medicine with any of the following medications: -cidofovir -ketorolac -methotrexate -pemetrexed This medicine may also interact with the following medications: -alcohol -aspirin -diuretics -lithium -other drugs for inflammation like prednisone -warfarin This list may not describe all possible interactions. Give your health care provider a list of all the medicines, herbs, non-prescription drugs, or dietary supplements you use. Also tell them if you smoke, drink alcohol, or use illegal drugs. Some items may interact with your medicine. What should I watch for while using this medicine? Tell your doctor or healthcare professional if your symptoms do not start to get better or if they get worse. This medicine does not prevent heart attack or stroke. In fact, this medicine may increase the chance of a heart attack or stroke. The chance may increase with longer use of this medicine and in people who have heart disease. If you take aspirin to prevent heart attack or stroke, talk with your doctor or health care professional. Do not take other medicines that contain aspirin, ibuprofen, or naproxen with this medicine. Side effects such as stomach upset, nausea, or ulcers may be more likely to occur. Many medicines available without a prescription should not be taken with  this medicine. This medicine can cause ulcers and bleeding in the stomach and intestines at any time during treatment. Ulcers and bleeding can happen without warning symptoms and can cause death. To  reduce your risk, do not smoke cigarettes or drink alcohol while you are taking this medicine. You may get drowsy or dizzy. Do not drive, use machinery, or do anything that needs mental alertness until you know how this medicine affects you. Do not stand or sit up quickly, especially if you are an older patient. This reduces the risk of dizzy or fainting spells. This medicine can cause you to bleed more easily. Try to avoid damage to your teeth and gums when you brush or floss your teeth. This medicine may be used to treat migraines. If you take migraine medicines for 10 or more days a month, your migraines may get worse. Keep a diary of headache days and medicine use. Contact your healthcare professional if your migraine attacks occur more frequently. What side effects may I notice from receiving this medicine? Side effects that you should report to your doctor or health care professional as soon as possible: -allergic reactions like skin rash, itching or hives, swelling of the face, lips, or tongue -severe stomach pain -signs and symptoms of bleeding such as bloody or black, tarry stools; red or dark-brown urine; spitting up blood or brown material that looks like coffee grounds; red spots on the skin; unusual bruising or bleeding from the eye, gums, or nose -signs and symptoms of a blood clot such as changes in vision; chest pain; severe, sudden headache; trouble speaking; sudden numbness or weakness of the face, arm, or leg -unexplained weight gain or swelling -unusually weak or tired -yellowing of eyes or skin Side effects that usually do not require medical attention (report to your doctor or health care professional if they continue or are bothersome): -bruising -diarrhea -dizziness, drowsiness -headache -nausea, vomiting This list may not describe all possible side effects. Call your doctor for medical advice about side effects. You may report side effects to FDA at 1-800-FDA-1088. Where  should I keep my medicine? Keep out of the reach of children. Store at room temperature between 15 and 30 degrees C (59 and 86 degrees F). Keep container tightly closed. Throw away any unused medicine after the expiration date. NOTE: This sheet is a summary. It may not cover all possible information. If you have questions about this medicine, talk to your doctor, pharmacist, or health care provider.  2019 Elsevier/Gold Standard (2016-10-19 12:43:57) Amoxicillin capsules or tablets What is this medicine? AMOXICILLIN (a mox i SIL in) is a penicillin antibiotic. It is used to treat certain kinds of bacterial infections. It will not work for colds, flu, or other viral infections. This medicine may be used for other purposes; ask your health care provider or pharmacist if you have questions. COMMON BRAND NAME(S): Amoxil, Moxilin, Sumox, Trimox What should I tell my health care provider before I take this medicine? They need to know if you have any of these conditions: -asthma -kidney disease -an unusual or allergic reaction to amoxicillin, other penicillins, cephalosporin antibiotics, other medicines, foods, dyes, or preservatives -pregnant or trying to get pregnant -breast-feeding How should I use this medicine? Take this medicine by mouth with a glass of water. Follow the directions on your prescription label. You may take this medicine with food or on an empty stomach. Take your medicine at regular intervals. Do not take your medicine more often than directed. Take all  of your medicine as directed even if you think your are better. Do not skip doses or stop your medicine early. Talk to your pediatrician regarding the use of this medicine in children. While this drug may be prescribed for selected conditions, precautions do apply. Overdosage: If you think you have taken too much of this medicine contact a poison control center or emergency room at once. NOTE: This medicine is only for you. Do not  share this medicine with others. What if I miss a dose? If you miss a dose, take it as soon as you can. If it is almost time for your next dose, take only that dose. Do not take double or extra doses. What may interact with this medicine? -amiloride -birth control pills -chloramphenicol -macrolides -probenecid -sulfonamides -tetracyclines This list may not describe all possible interactions. Give your health care provider a list of all the medicines, herbs, non-prescription drugs, or dietary supplements you use. Also tell them if you smoke, drink alcohol, or use illegal drugs. Some items may interact with your medicine. What should I watch for while using this medicine? Tell your doctor or health care professional if your symptoms do not improve in 2 or 3 days. Take all of the doses of your medicine as directed. Do not skip doses or stop your medicine early. If you are diabetic, you may get a false positive result for sugar in your urine with certain brands of urine tests. Check with your doctor. Do not treat diarrhea with over-the-counter products. Contact your doctor if you have diarrhea that lasts more than 2 days or if the diarrhea is severe and watery. What side effects may I notice from receiving this medicine? Side effects that you should report to your doctor or health care professional as soon as possible: -allergic reactions like skin rash, itching or hives, swelling of the face, lips, or tongue -breathing problems -dark urine -redness, blistering, peeling or loosening of the skin, including inside the mouth -seizures -severe or watery diarrhea -trouble passing urine or change in the amount of urine -unusual bleeding or bruising -unusually weak or tired -yellowing of the eyes or skin Side effects that usually do not require medical attention (report to your doctor or health care professional if they continue or are bothersome): -dizziness -headache -stomach upset -trouble  sleeping This list may not describe all possible side effects. Call your doctor for medical advice about side effects. You may report side effects to FDA at 1-800-FDA-1088. Where should I keep my medicine? Keep out of the reach of children. Store between 68 and 77 degrees F (20 and 25 degrees C). Keep bottle closed tightly. Throw away any unused medicine after the expiration date. NOTE: This sheet is a summary. It may not cover all possible information. If you have questions about this medicine, talk to your doctor, pharmacist, or health care provider.  2019 Elsevier/Gold Standard (2007-05-08 14:10:59)     Sildenafil tablets (Erectile Dysfunction) What is this medicine? SILDENAFIL (sil DEN a fil) is used to treat erection problems in men. This medicine may be used for other purposes; ask your health care provider or pharmacist if you have questions. COMMON BRAND NAME(S): Viagra What should I tell my health care provider before I take this medicine? They need to know if you have any of these conditions: -bleeding disorders -eye or vision problems, including a rare inherited eye disease called retinitis pigmentosa -anatomical deformation of the penis, Peyronie's disease, or history of priapism (painful and prolonged erection) -  heart disease, angina, a history of heart attack, irregular heart beats, or other heart problems -high or low blood pressure -history of blood diseases, like sickle cell anemia or leukemia -history of stomach bleeding -kidney disease -liver disease -stroke -an unusual or allergic reaction to sildenafil, other medicines, foods, dyes, or preservatives -pregnant or trying to get pregnant -breast-feeding How should I use this medicine? Take this medicine by mouth with a glass of water. Follow the directions on the prescription label. The dose is usually taken 1 hour before sexual activity. You should not take the dose more than once per day. Do not take your medicine  more often than directed. Talk to your pediatrician regarding the use of this medicine in children. This medicine is not used in children for this condition. Overdosage: If you think you have taken too much of this medicine contact a poison control center or emergency room at once. NOTE: This medicine is only for you. Do not share this medicine with others. What if I miss a dose? This does not apply. Do not take double or extra doses. What may interact with this medicine? Do not take this medicine with any of the following medications: -cisapride -nitrates like amyl nitrite, isosorbide dinitrate, isosorbide mononitrate, nitroglycerin -riociguat This medicine may also interact with the following medications: -antiviral medicines for HIV or AIDS -bosentan -certain medicines for benign prostatic hyperplasia (BPH) -certain medicines for blood pressure -certain medicines for fungal infections like ketoconazole and itraconazole -cimetidine -erythromycin -rifampin This list may not describe all possible interactions. Give your health care provider a list of all the medicines, herbs, non-prescription drugs, or dietary supplements you use. Also tell them if you smoke, drink alcohol, or use illegal drugs. Some items may interact with your medicine. What should I watch for while using this medicine? If you notice any changes in your vision while taking this drug, call your doctor or health care professional as soon as possible. Stop using this medicine and call your health care provider right away if you have a loss of sight in one or both eyes. Contact your doctor or health care professional right away if you have an erection that lasts longer than 4 hours or if it becomes painful. This may be a sign of a serious problem and must be treated right away to prevent permanent damage. If you experience symptoms of nausea, dizziness, chest pain or arm pain upon initiation of sexual activity after taking this  medicine, you should refrain from further activity and call your doctor or health care professional as soon as possible. Do not drink alcohol to excess (examples, 5 glasses of wine or 5 shots of whiskey) when taking this medicine. When taken in excess, alcohol can increase your chances of getting a headache or getting dizzy, increasing your heart rate or lowering your blood pressure. Using this medicine does not protect you or your partner against HIV infection (the virus that causes AIDS) or other sexually transmitted diseases. What side effects may I notice from receiving this medicine? Side effects that you should report to your doctor or health care professional as soon as possible: -allergic reactions like skin rash, itching or hives, swelling of the face, lips, or tongue -breathing problems -changes in hearing -changes in vision -chest pain -fast, irregular heartbeat -prolonged or painful erection -seizures Side effects that usually do not require medical attention (report to your doctor or health care professional if they continue or are bothersome): -back pain -dizziness -flushing -headache -indigestion -muscle  aches -nausea -stuffy or runny nose This list may not describe all possible side effects. Call your doctor for medical advice about side effects. You may report side effects to FDA at 1-800-FDA-1088. Where should I keep my medicine? Keep out of reach of children. Store at room temperature between 15 and 30 degrees C (59 and 86 degrees F). Throw away any unused medicine after the expiration date. NOTE: This sheet is a summary. It may not cover all possible information. If you have questions about this medicine, talk to your doctor, pharmacist, or health care provider.  2019 Elsevier/Gold Standard (2015-01-28 12:00:25)

## 2018-02-17 LAB — LIPID PANEL
Chol/HDL Ratio: 2.1 ratio (ref 0.0–5.0)
Cholesterol, Total: 79 mg/dL — ABNORMAL LOW (ref 100–199)
HDL: 37 mg/dL — ABNORMAL LOW (ref >39)
LDL Calculated: 27 mg/dL (ref 0–99)
Triglycerides: 77 mg/dL (ref 0–149)
VLDL Cholesterol Cal: 15 mg/dL (ref 5–40)

## 2018-02-17 LAB — COMPREHENSIVE METABOLIC PANEL
ALT: 16 IU/L (ref 0–44)
AST: 8 IU/L (ref 0–40)
Albumin/Globulin Ratio: 1.3 (ref 1.2–2.2)
Albumin: 3.9 g/dL (ref 3.5–5.5)
Alkaline Phosphatase: 77 IU/L (ref 39–117)
BUN/Creatinine Ratio: 8 — ABNORMAL LOW (ref 9–20)
BUN: 6 mg/dL (ref 6–20)
Bilirubin Total: 0.3 mg/dL (ref 0.0–1.2)
CO2: 23 mmol/L (ref 20–29)
Calcium: 9.4 mg/dL (ref 8.7–10.2)
Chloride: 103 mmol/L (ref 96–106)
Creatinine, Ser: 0.75 mg/dL — ABNORMAL LOW (ref 0.76–1.27)
GFR calc Af Amer: 136 mL/min/{1.73_m2} (ref >59)
GFR calc non Af Amer: 118 mL/min/{1.73_m2} (ref >59)
Globulin, Total: 3 g/dL (ref 1.5–4.5)
Glucose: 191 mg/dL — ABNORMAL HIGH (ref 65–99)
Potassium: 4.3 mmol/L (ref 3.5–5.2)
Sodium: 142 mmol/L (ref 134–144)
Total Protein: 6.9 g/dL (ref 6.0–8.5)

## 2018-02-17 LAB — CBC WITH DIFFERENTIAL/PLATELET
Basophils Absolute: 0 10*3/uL (ref 0.0–0.2)
Basos: 0 %
EOS (ABSOLUTE): 0.1 10*3/uL (ref 0.0–0.4)
Eos: 2 %
Hematocrit: 43.1 % (ref 37.5–51.0)
Hemoglobin: 13.8 g/dL (ref 13.0–17.7)
Immature Grans (Abs): 0 10*3/uL (ref 0.0–0.1)
Immature Granulocytes: 1 %
Lymphocytes Absolute: 1.9 10*3/uL (ref 0.7–3.1)
Lymphs: 31 %
MCH: 24.6 pg — ABNORMAL LOW (ref 26.6–33.0)
MCHC: 32 g/dL (ref 31.5–35.7)
MCV: 77 fL — ABNORMAL LOW (ref 79–97)
Monocytes Absolute: 0.5 10*3/uL (ref 0.1–0.9)
Monocytes: 8 %
Neutrophils Absolute: 3.6 10*3/uL (ref 1.4–7.0)
Neutrophils: 58 %
Platelets: 215 10*3/uL (ref 150–450)
RBC: 5.6 x10E6/uL (ref 4.14–5.80)
RDW: 12.9 % (ref 12.3–15.4)
WBC: 6 10*3/uL (ref 3.4–10.8)

## 2018-02-17 LAB — TSH: TSH: 1 u[IU]/mL (ref 0.450–4.500)

## 2018-02-17 LAB — MICROALBUMIN, URINE: Microalbumin, Urine: 16.8 ug/mL

## 2018-02-22 ENCOUNTER — Encounter (HOSPITAL_BASED_OUTPATIENT_CLINIC_OR_DEPARTMENT_OTHER): Payer: Self-pay | Attending: Internal Medicine

## 2018-03-01 ENCOUNTER — Other Ambulatory Visit: Payer: Self-pay

## 2018-03-01 MED ORDER — INSULIN ASPART PROT & ASPART (70-30 MIX) 100 UNIT/ML ~~LOC~~ SUSP: 35.0000 [IU] | Freq: Two times a day (BID) | SUBCUTANEOUS | 0 refills | Status: DC

## 2018-03-01 NOTE — Telephone Encounter (Signed)
Medication sent to pharmacy  

## 2018-03-10 ENCOUNTER — Emergency Department (HOSPITAL_COMMUNITY)
Admission: EM | Admit: 2018-03-10 | Discharge: 2018-03-10 | Disposition: A | Discharge status: Home / Self Care | Payer: Self-pay | Attending: Emergency Medicine | Admitting: Emergency Medicine

## 2018-03-10 ENCOUNTER — Encounter (HOSPITAL_COMMUNITY): Payer: Self-pay | Admitting: Emergency Medicine

## 2018-03-10 ENCOUNTER — Emergency Department (HOSPITAL_BASED_OUTPATIENT_CLINIC_OR_DEPARTMENT_OTHER): Payer: Self-pay

## 2018-03-10 ENCOUNTER — Emergency Department (HOSPITAL_COMMUNITY): Payer: Self-pay

## 2018-03-10 DIAGNOSIS — T8789 Other complications of amputation stump: Secondary | ICD-10-CM | POA: Insufficient documentation

## 2018-03-10 DIAGNOSIS — E119 Type 2 diabetes mellitus without complications: Secondary | ICD-10-CM | POA: Insufficient documentation

## 2018-03-10 DIAGNOSIS — T879 Unspecified complications of amputation stump: Secondary | ICD-10-CM

## 2018-03-10 DIAGNOSIS — Z89412 Acquired absence of left great toe: Secondary | ICD-10-CM | POA: Insufficient documentation

## 2018-03-10 DIAGNOSIS — Z794 Long term (current) use of insulin: Secondary | ICD-10-CM | POA: Insufficient documentation

## 2018-03-10 DIAGNOSIS — Y999 Unspecified external cause status: Secondary | ICD-10-CM | POA: Insufficient documentation

## 2018-03-10 DIAGNOSIS — Y69 Unspecified misadventure during surgical and medical care: Secondary | ICD-10-CM | POA: Insufficient documentation

## 2018-03-10 DIAGNOSIS — Y929 Unspecified place or not applicable: Secondary | ICD-10-CM | POA: Insufficient documentation

## 2018-03-10 DIAGNOSIS — Y939 Activity, unspecified: Secondary | ICD-10-CM | POA: Insufficient documentation

## 2018-03-10 DIAGNOSIS — Z79899 Other long term (current) drug therapy: Secondary | ICD-10-CM | POA: Insufficient documentation

## 2018-03-10 DIAGNOSIS — M79676 Pain in unspecified toe(s): Secondary | ICD-10-CM

## 2018-03-10 LAB — BASIC METABOLIC PANEL
Anion gap: 11 (ref 5–15)
BUN: 8 mg/dL (ref 6–20)
CO2: 24 mmol/L (ref 22–32)
Calcium: 9 mg/dL (ref 8.9–10.3)
Chloride: 106 mmol/L (ref 98–111)
Creatinine, Ser: 0.74 mg/dL (ref 0.61–1.24)
GFR calc Af Amer: >60 mL/min (ref >60)
GFR calc non Af Amer: >60 mL/min (ref >60)
Glucose, Bld: 128 mg/dL — ABNORMAL HIGH (ref 70–99)
Potassium: 3.7 mmol/L (ref 3.5–5.1)
Sodium: 141 mmol/L (ref 135–145)

## 2018-03-10 LAB — CBC WITH DIFFERENTIAL/PLATELET
Abs Immature Granulocytes: 0.02 10*3/uL (ref 0.00–0.07)
Basophils Absolute: 0 10*3/uL (ref 0.0–0.1)
Basophils Relative: 0 %
Eosinophils Absolute: 0.1 10*3/uL (ref 0.0–0.5)
Eosinophils Relative: 1 %
HCT: 42.1 % (ref 39.0–52.0)
Hemoglobin: 12.7 g/dL — ABNORMAL LOW (ref 13.0–17.0)
Immature Granulocytes: 0 %
Lymphocytes Relative: 30 %
Lymphs Abs: 1.9 10*3/uL (ref 0.7–4.0)
MCH: 24.8 pg — ABNORMAL LOW (ref 26.0–34.0)
MCHC: 30.2 g/dL (ref 30.0–36.0)
MCV: 82.2 fL (ref 80.0–100.0)
Monocytes Absolute: 0.5 10*3/uL (ref 0.1–1.0)
Monocytes Relative: 8 %
Neutro Abs: 3.9 10*3/uL (ref 1.7–7.7)
Neutrophils Relative %: 61 %
Platelets: 160 10*3/uL (ref 150–400)
RBC: 5.12 MIL/uL (ref 4.22–5.81)
RDW: 13.1 % (ref 11.5–15.5)
WBC: 6.5 10*3/uL (ref 4.0–10.5)
nRBC: 0 % (ref 0.0–0.2)

## 2018-03-10 LAB — CBG MONITORING, ED: Glucose-Capillary: 132 mg/dL — ABNORMAL HIGH (ref 70–99)

## 2018-03-10 LAB — I-STAT CG4 LACTIC ACID, ED: Lactic Acid, Venous: 1.99 mmol/L — ABNORMAL HIGH (ref 0.5–1.9)

## 2018-03-10 MED ORDER — SODIUM CHLORIDE 0.9 % IV BOLUS
1000.0000 mL | Freq: Once | INTRAVENOUS | Status: AC
  Administered 2018-03-10: 1000 mL via INTRAVENOUS

## 2018-03-10 MED ORDER — INSULIN ASPART PROT & ASPART (70-30 MIX) 100 UNIT/ML ~~LOC~~ SUSP
35.0000 [IU] | Freq: Once | SUBCUTANEOUS | Status: AC
  Administered 2018-03-10: 35 [IU] via SUBCUTANEOUS
  Filled 2018-03-10: qty 10

## 2018-03-10 NOTE — Discharge Instructions (Signed)
Keep the area clean and dry.  Wash with warm water and soap once or twice daily.  Change the dressing once or twice daily.  Follow-up with Dr. Audrie Lia office as soon as possible for reevaluation.  Return to the emergency department if any concerning signs or symptoms develop such as fevers, swelling, abnormal drainage, redness of the foot or streaking of redness up the leg.

## 2018-03-10 NOTE — ED Provider Notes (Signed)
[facility] DEPT Provider Note   CSN: [id number] Arrival date & time: 03/10/18  1140     History   Chief Complaint Chief Complaint  Patient presents with  . toe bleeding  . Post-op Problem  . Leg Pain    HPI Jonathan [name] is a 38 y.o. male with history of diabetes mellitus, osteomyelitis of the great toe of the left foot, major depressive disorder, diabetic neuropathy presents for evaluation of acute onset, improving bleeding from wound this morning.  He underwent amputation of the left great toe on 02/02/18 due to osteomyelitis.  Has not been able to follow-up with orthopedics since the procedure and states that when he has attempted to reach out to the office he was told that Dr. Sharol Given was not available.  He has not changed the dressing since the surgery.  Yesterday while walking 1 mile he noticed a warm feeling to the foot but thought it was sweat.  This morning when he awoke he noticed a large amount of blood coming from his surgical incision.  He also noted severe pain to the entire left lower extremity but this has resolved.  He notes sharp and aching pain to the left foot which has been worsening this month.  Denies fevers but notes nausea.  Also notes some swelling to the extremity.  Reports that his blood sugars have been overall well controlled and under 150 this morning.   The history is provided by the patient.    Past Medical History:  Diagnosis Date  . Diabetes mellitus     Patient Active Problem List   Diagnosis Date Noted  . Osteomyelitis of great toe of left foot ([facility]) 01/30/2018  . Osteomyelitis ([place]) 01/30/2018  . Depression with anxiety 01/30/2018  . Major depressive disorder, recurrent severe without psychotic features ([place]) 07/16/2017  . OD (overdose of drug), intentional self-harm, initial encounter ([place]) 07/15/2017  . Cellulitis 12/26/2016  . Open toe wound, subsequent encounter 12/12/2016  . Hyperglycemia 12/06/2016  .  Diabetic ulcer of left foot ([place]) 12/05/2016  . Diabetic neuropathy ([place]) 10/16/2015  . Erectile dysfunction 08/05/2015  . Low testosterone 09/12/2014  . DM type 2, uncontrolled, with neuropathy ([place]) 06/12/2014  . Diabetes mellitus, type II ([place]) 03/23/2012  . Noncompliance 03/23/2012  . Gastroenteritis and colitis, viral 03/23/2012    Past Surgical History:  Procedure Laterality Date  . AMPUTATION Left 02/02/2018   Procedure: LEFT GREAT TOE AMPUTATION;  Surgeon: Newt Minion, MD;  Location: [facility];  Service: Orthopedics;  Laterality: Left;        Home Medications    Prior to Admission medications   Medication Sig Start Date End Date Taking? Authorizing Provider  atorvastatin (LIPITOR) 40 MG tablet Take 40 mg by mouth daily. 01/18/18  Yes [provider]  ibuprofen (ADVIL,MOTRIN) 800 MG tablet Take 1 tablet (800 mg total) by mouth every 8 (eight) hours as needed. Patient taking differently: Take 800 mg by mouth every 8 (eight) hours as needed for moderate pain.  02/16/18  Yes Azzie Glatter, FNP  insulin aspart protamine- aspart (NOVOLOG MIX 70/30) (70-30) 100 UNIT/ML injection Inject 0.35 mLs (35 Units total) into the skin 2 (two) times daily with a meal. 03/01/18 03/31/18 Yes Azzie Glatter, FNP  metFORMIN (GLUCOPHAGE) 1000 MG tablet Take 1 tablet (1,000 mg total) by mouth 2 (two) times daily. 03/02/17  Yes Forde Dandy, PharmD  amoxicillin (AMOXIL) 500 MG capsule Take 1 capsule (500 mg total) by mouth 2 (  two) times daily. Patient not taking: Reported on 03/10/2018 02/16/18   Azzie Glatter, FNP  blood glucose meter kit and supplies Dispense based on patient and insurance preference. Use up to four times daily as directed. (FOR ICD-10 E10.9, E11.9). 02/03/18   Shelly Coss, MD  sildenafil (VIAGRA) 100 MG tablet Take 1 tablet (100 mg total) by mouth daily as needed for erectile dysfunction. 02/16/18   Azzie Glatter, FNP    Family History Family History    Problem Relation Age of Onset  . Other Mother        Healthy  . Other Father        Healthy    Social History Social History   Tobacco Use  . Smoking status: Never Smoker  . Smokeless tobacco: Never Used  Substance Use Topics  . Alcohol use: Yes    Alcohol/week: 2.0 standard drinks    Types: 1 Glasses of wine, 1 Cans of beer per week    Comment: occassional  . Drug use: No     Allergies   Patient has no known allergies.   Review of Systems Review of Systems  Constitutional: Negative for chills and fever.  Cardiovascular: Positive for leg swelling.  Gastrointestinal: Positive for nausea. Negative for vomiting.  Musculoskeletal: Positive for arthralgias and myalgias.  Skin: Positive for wound.  All other systems reviewed and are negative.    Physical Exam Updated Vital Signs BP (!) 154/94   Pulse 70   Temp 98.9 F (37.2 C) (Oral)   Resp 17   Ht _0  (2.032 m)   Wt 104.8 kg   SpO2 100%   BMI 25.38 kg/m   Physical Exam Vitals signs and nursing note reviewed.  Constitutional:      General: He is not in acute distress.    Appearance: He is well-developed.  HENT:     Head: Normocephalic and atraumatic.  Eyes:     General:        Right eye: No discharge.        Left eye: No discharge.     Conjunctiva/sclera: Conjunctivae normal.  Neck:     Vascular: No JVD.     Trachea: No tracheal deviation.  Cardiovascular:     Rate and Rhythm: Tachycardia present.     Pulses: Normal pulses.     Comments: 2+ DP/PT pulses bilaterally, Homans sign present on the left. Pulmonary:     Effort: Pulmonary effort is normal.  Abdominal:     General: There is no distension.     Tenderness: There is no abdominal tenderness. There is no guarding or rebound.  Musculoskeletal:     Comments: See below images.  Patient status post left great toe amputation with sutures in place.  There appears to be a deroofed blister with healthy appearing underlying tissue.  No erythema,  induration, or abnormal drainage.  Mildly tender to palpation near the base of the second toe. There is some dried blood to the foot.   Skin:    General: Skin is warm and dry.     Findings: No erythema.  Neurological:     Mental Status: He is alert.     Comments: Fluent speech, no facial droop, sensation intact to soft touch of bilateral lower extremities  Psychiatric:        Behavior: Behavior normal.          ED Treatments / Results  Labs (all labs ordered are listed, but only abnormal results are displayed)  Labs Reviewed  BASIC METABOLIC PANEL - Abnormal; Notable for the following components:      Result Value   Glucose, Bld 128 (*)    All other components within normal limits  CBC WITH DIFFERENTIAL/PLATELET - Abnormal; Notable for the following components:   Hemoglobin 12.7 (*)    MCH 24.8 (*)    All other components within normal limits  CBG MONITORING, ED - Abnormal; Notable for the following components:   Glucose-Capillary 132 (*)    All other components within normal limits  I-STAT CG4 LACTIC ACID, ED - Abnormal; Notable for the following components:   Lactic Acid, Venous 1.99 (*)    All other components within normal limits  I-STAT CG4 LACTIC ACID, ED    EKG None  Radiology Dg Foot Complete Left  Result Date: 03/10/2018 CLINICAL DATA:  Bleeding of left foot after surgery. EXAM: LEFT FOOT - COMPLETE 3+ VIEW COMPARISON:  Radiographs of December 25, 2016. FINDINGS: Status post surgical amputation of left first toe. Mild posterior calcaneal spurring is noted. No lytic destruction is seen to suggest osteomyelitis. Remaining joint spaces are unremarkable. IMPRESSION: Status post amputation of left first toe. No lytic destruction is seen to suggest osteomyelitis at this time. Electronically Signed   By: Marijo Conception, M.D.   On: 03/10/2018 14:06   Vas [place] Lower Extremity Venous (dvt) (mc And Wl 7a-7p)  Result Date: 03/10/2018  Lower Venous Study Indications: Toe pain  recent amputation, new bleeding.  Performing Technologist: June Leap, I  Examination Guidelines: A complete evaluation includes B-mode imaging, spectral Doppler, color Doppler, and power Doppler as needed of all accessible portions of each vessel. Bilateral testing is considered an integral part of a complete examination. Limited examinations for reoccurring indications may be performed as noted.  Right Venous Findings: +---+---------------+---------+-----------+----------+-------+    CompressibilityPhasicitySpontaneityPropertiesSummary +---+---------------+---------+-----------+----------+-------+ CFVFull           Yes      Yes                          +---+---------------+---------+-----------+----------+-------+  Left Venous Findings: +---------+---------------+---------+-----------+----------+-------+          CompressibilityPhasicitySpontaneityPropertiesSummary +---------+---------------+---------+-----------+----------+-------+ CFV      Full           Yes      Yes                          +---------+---------------+---------+-----------+----------+-------+ SFJ      Full                                                 +---------+---------------+---------+-----------+----------+-------+ FV Prox  Full                                                 +---------+---------------+---------+-----------+----------+-------+ FV Mid   Full                                                 +---------+---------------+---------+-----------+----------+-------+ FV DistalFull                                                 +---------+---------------+---------+-----------+----------+-------+  PFV      Full                                                 +---------+---------------+---------+-----------+----------+-------+ POP      Full           Yes      Yes                          +---------+---------------+---------+-----------+----------+-------+ PTV       Full                                                 +---------+---------------+---------+-----------+----------+-------+ PERO     Full                                                 +---------+---------------+---------+-----------+----------+-------+    Summary: Right: No evidence of common femoral vein obstruction. Left: There is no evidence of deep vein thrombosis in the lower extremity. No cystic structure found in the popliteal fossa.  *See table(s) above for measurements and observations.    Preliminary     Procedures Procedures (including critical care time)  Medications Ordered in ED Medications  insulin aspart protamine- aspart (NOVOLOG MIX 70/30) injection 35 Units (has no administration in time range)  sodium chloride 0.9 % bolus 1,000 mL (1,000 mLs Intravenous New Bag/Given 03/10/18 1438)     Initial Impression / Assessment and Plan / ED Course  I have reviewed the triage vital signs and the nursing notes.  Pertinent labs & imaging results that were available during my care of the patient were reviewed by me and considered in my medical decision making (see chart for details).     Patient presenting for evaluation of bleeding from left great toe amputation site.  Bleeding is controlled presently.  He is afebrile, initially tachycardic with improvement after fluid bolus.  His lactate is mildly elevated but he does not appear to be septic at this time.  He is generally well-appearing and the wound itself does not show any overt signs of superimposed skin infection.  He is neurovascularly intact and compartments are soft.  Radiographs negative for osteomyelitis.  Doubt necrotizing fasciitis.  His DVT study is negative.  Lab work reviewed by me shows no leukocytosis, mild anemia and mild hyperglycemia.  No evidence of DKA, HHS, or septic joint.  The wound was extensively cleaned in the ED.  He has been having difficulty following up with Dr. Jess Barters office but it appears he  has been calling the wrong phone number.  He ran out of his insulin today and has not had any.  We will give his home dose in the ED.  Recommend follow-up with orthopedics and wound care plan.  Discussed strict ED return precautions. Pt verbalized understanding of and agreement with plan and is safe for discharge home at this time.  Patient seen and evaluated by Dr. Eulis Foster who agrees with assessment and plan at this time.  Final Clinical Impressions(s) / ED Diagnoses   Final diagnoses:  Complication of  toe amputation stump [facility])    ED Discharge Orders    None       Debroah Baller 03/10/18 1606    Daleen Bo, MD 03/10/18 1731

## 2018-03-10 NOTE — ED Notes (Signed)
Bed: WTR7 Expected date:  Expected time:  Means of arrival:  Comments: 

## 2018-03-10 NOTE — ED Provider Notes (Signed)
  Face-to-face evaluation   History: Patient here for drainage from his wound, following great toe amputation left, 1 month ago.  He states he has only mild pain in his left foot that is worse with walking.  Physical exam: Left foot, surgical wound, with stitches intact in the wound edges without dehiscence.  Patient has sloughed a large blister approximately 3 and half centimeters in diameter.  Wound is healing well beneath this blister.  The blister tissue was debrided by me.  Nursing cleansed the wound and redressed it.  Medical screening examination/treatment/procedure(s) were conducted as a shared visit with non-physician practitioner(s) and myself.  I personally evaluated the patient during the encounter   Mancel BaleWentz, Nayelly Laughman, MD 03/10/18 1731

## 2018-03-10 NOTE — ED Triage Notes (Signed)
Pt reports had surgery on left foot last month. Reports woke up with bed covered in blood. Pt has bright red blood through sock and in left shoe noted in triage. Pt c/o left leg pains.

## 2018-03-10 NOTE — ED Notes (Signed)
POC CBG 132

## 2018-03-10 NOTE — Progress Notes (Signed)
  Pt presents with toe pain.  Venous duplex completed. Preliminary: Left leg venous duplex negative for DVT.    Jeb LeveringJill Deneka [name], BS, RDMS, RVT

## 2018-04-09 ENCOUNTER — Other Ambulatory Visit: Payer: Self-pay | Admitting: Family Medicine

## 2018-04-09 ENCOUNTER — Ambulatory Visit (INDEPENDENT_AMBULATORY_CARE_PROVIDER_SITE_OTHER): Payer: Self-pay | Admitting: Physician Assistant

## 2018-04-09 ENCOUNTER — Encounter (INDEPENDENT_AMBULATORY_CARE_PROVIDER_SITE_OTHER): Payer: Self-pay | Admitting: Physician Assistant

## 2018-04-09 VITALS — Ht >= 80 in | Wt 231.0 lb

## 2018-04-09 DIAGNOSIS — Z89412 Acquired absence of left great toe: Secondary | ICD-10-CM

## 2018-04-09 DIAGNOSIS — Z794 Long term (current) use of insulin: Secondary | ICD-10-CM

## 2018-04-09 DIAGNOSIS — E1142 Type 2 diabetes mellitus with diabetic polyneuropathy: Secondary | ICD-10-CM

## 2018-04-09 MED ORDER — INSULIN ASPART PROT & ASPART (70-30 MIX) 100 UNIT/ML ~~LOC~~ SUSP: 35.0000 [IU] | Freq: Two times a day (BID) | SUBCUTANEOUS | 0 refills | Status: DC

## 2018-04-09 MED FILL — NOVOLOG MIX 70/30 VIAL: (70-30) 100 | 14 days supply | Qty: 10 | Fill #0

## 2018-04-09 MED FILL — SILDENAFIL CITRATE 100 MG T: 100 | 30 days supply | Qty: 10 | Fill #0

## 2018-04-09 NOTE — Progress Notes (Signed)
Office Visit Note   Patient: Jonathan [name]           Date of Birth: 1980/05/23           MRN: [id number] Visit Date: 04/09/2018              Requested by: Kallie Locks, FNP [address] [place], [place] [address] PCP: Kallie Locks, FNP  Chief Complaint  Patient presents with  . Left Foot - Open Wound, Routine Post Op    02/02/18 left great toe amp      HPI: The patient is a 38 year old gentleman who is seen for postoperative follow-up following a left great toe amputation on 02/02/2018.  He failed to follow-up in the office and then presented to the emergency room in mid January and they debrided his new ulcer over the plantar surface of the left first metatarsal head.  He presents for follow-up complaining of some swelling and discomfort over the area.  His sutures are still intact.  He is 2 months postop.  Assessment & Plan: Visit Diagnoses:  1. Status post amputation of left great toe (HCC)   2. Type 2 diabetes mellitus with diabetic polyneuropathy, with long-term current use of insulin (HCC)     Plan: Sutures were removed today.  He has a new area of ulceration were going to start some doxycycline 100 mg p.o. twice daily.  Also start some Iodosorb ointment to the area.  We have provided him with a Darco shoe to try to offload the area.  We also discussed that if there are any issues in the interim that he can call and get a same-day appointment.  Recommend he elevate and offload as much as possible and follow-up in 2 weeks.  Follow-Up Instructions: Return in about 2 weeks (around 04/23/2018).   Ortho Exam  Patient is alert, oriented, no adenopathy, well-dressed, normal affect, normal respiratory effort. The left great toe amputation site sutures were removed.  There are no signs of ulceration.  There is a lot of opened crusting and dried tissue which was debrided with gauze.  The amputation site overall appears to be healing.  He has a new area of ulceration with  fat layer exposed no tendon or bone exposed.  Is approximately 1cm diameter.  There is no sign of cellulitis of the foot.  He has good palpable pedal pulses.  Imaging: No results found.   Labs: Lab Results  Component Value Date   HGBA1C 11.0 (A) 02/16/2018   HGBA1C 12.0 (H) 01/30/2018   HGBA1C >15.5 (H) 07/15/2017   ESRSEDRATE 5 01/30/2018   ESRSEDRATE 23 (H) 12/26/2016   ESRSEDRATE 1 12/05/2016   CRP <0.8 01/30/2018   CRP 5.8 (H) 12/26/2016   CRP <0.8 12/05/2016   REPTSTATUS 02/04/2018 FINAL 01/30/2018   GRAMSTAIN  12/26/2016    FEW WBC PRESENT, PREDOMINANTLY PMN ABUNDANT GRAM POSITIVE COCCI IN PAIRS ABUNDANT GRAM NEGATIVE RODS Performed at [facility], [address], [place], [place] [address]    CULT  01/30/2018    NO GROWTH 5 DAYS Performed at [facility], [address], [place], [place] [address]      Lab Results  Component Value Date   ALBUMIN 3.9 02/16/2018   ALBUMIN 4.0 07/14/2017   ALBUMIN 3.6 12/25/2016    Body mass index is 25.38 kg/m.  Orders:  No orders of the defined types were placed in this encounter.  No orders of the defined types were placed  in this encounter.    Procedures: No procedures performed  Clinical Data: No additional findings.  ROS:  All other systems negative, except as noted in the HPI. Review of Systems  Objective: Vital Signs: Ht 6\' 8"  (2.032 m)   Wt 231 lb (104.8 kg)   BMI 25.38 kg/m   Specialty Comments:  No specialty comments available.  PMFS History: Patient Active Problem List   Diagnosis Date Noted  . Osteomyelitis of great toe of left foot (HCC) 01/30/2018  . Osteomyelitis (HCC) 01/30/2018  . Depression with anxiety 01/30/2018  . Major depressive disorder, recurrent severe without psychotic features ([facility]) 07/16/2017  . OD (overdose of drug), intentional self-harm, initial encounter ([facility]) 07/15/2017  . Cellulitis 12/26/2016  . Open toe wound, subsequent encounter 12/12/2016  .  Hyperglycemia 12/06/2016  . Diabetic ulcer of left foot (HCC) 12/05/2016  . Diabetic neuropathy (HCC) 10/16/2015  . Erectile dysfunction 08/05/2015  . Low testosterone 09/12/2014  . DM type 2, uncontrolled, with neuropathy (HCC) 06/12/2014  . Diabetes mellitus, type II (HCC) 03/23/2012  . Noncompliance 03/23/2012  . Gastroenteritis and colitis, viral 03/23/2012   Past Medical History:  Diagnosis Date  . Diabetes mellitus     Family History  Problem Relation Age of Onset  . Other Mother        Healthy  . Other Father        Healthy    Past Surgical History:  Procedure Laterality Date  . AMPUTATION Left 02/02/2018   Procedure: LEFT GREAT TOE AMPUTATION;  Surgeon: Nadara Mustarduda, Marcus V, MD;  Location: [facility] OR;  Service: Orthopedics;  Laterality: Left;   Social History   Occupational History  . Not on file  Tobacco Use  . Smoking status: Never Smoker  . Smokeless tobacco: Never Used  Substance and Sexual Activity  . Alcohol use: Yes    Alcohol/week: 2.0 standard drinks    Types: 1 Glasses of wine, 1 Cans of beer per week    Comment: occassional  . Drug use: No  . Sexual activity: Not on file

## 2018-04-20 ENCOUNTER — Ambulatory Visit: Payer: Self-pay | Admitting: Family Medicine

## 2018-04-23 ENCOUNTER — Encounter (INDEPENDENT_AMBULATORY_CARE_PROVIDER_SITE_OTHER): Payer: Self-pay | Admitting: Physician Assistant

## 2018-04-23 ENCOUNTER — Ambulatory Visit (INDEPENDENT_AMBULATORY_CARE_PROVIDER_SITE_OTHER): Payer: Self-pay | Admitting: Physician Assistant

## 2018-04-23 VITALS — Ht >= 80 in | Wt 231.0 lb

## 2018-04-23 DIAGNOSIS — Z89412 Acquired absence of left great toe: Secondary | ICD-10-CM

## 2018-04-23 DIAGNOSIS — E1142 Type 2 diabetes mellitus with diabetic polyneuropathy: Secondary | ICD-10-CM

## 2018-04-23 DIAGNOSIS — Z794 Long term (current) use of insulin: Secondary | ICD-10-CM

## 2018-04-23 DIAGNOSIS — L97521 Non-pressure chronic ulcer of other part of left foot limited to breakdown of skin: Secondary | ICD-10-CM

## 2018-04-23 MED ORDER — MUPIROCIN 2 % EX OINT: 1.0000 "application " | TOPICAL_OINTMENT | Freq: Every day | CUTANEOUS | 0 refills | Status: DC

## 2018-04-23 MED ORDER — GABAPENTIN 300 MG PO CAPS: 300.0000 mg | ORAL_CAPSULE | Freq: Every day | ORAL | 11 refills | Status: DC

## 2018-04-23 NOTE — Progress Notes (Signed)
Office Visit Note   Patient: Jonathan [name]           Date of Birth: Aug 30, 1980           MRN: [id number] Visit Date: 04/23/2018              Requested by: Kallie Locks, FNP [address] [place], [place] [address] PCP: Kallie Locks, FNP  Chief Complaint  Patient presents with  . Left Foot - Routine Post Op    02/02/18 left foot great toe amputation       HPI: The patient is a 38 year old gentleman who is seen for postoperative follow-up following a left great toe amputation on 02/02/2018.  His amputation site was healing well but he had developed a new area of ulceration over the first metatarsal head plantar surface and he has completed a course of doxycycline.  He has been using some Iodosorb ointment to the area.  He is also been wearing a Darco shoe and trying to stay off of the foot as much as possible and elevate.  He reports he is not currently working as he worked at a job that is no longer available.  We did discuss that a more sedentary type job may allow him to heal better. He reported phantom pain and burning and tingling over the foot.  He reports he has been on gabapentin 300 mg twice a day in the past but allowed him the prescription to lapse.  We discussed getting back on the gabapentin. Assessment & Plan: Visit Diagnoses:  1. Status post amputation of left great toe (HCC)   2. Foot ulcer, left, limited to breakdown of skin (HCC)   3. Type 2 diabetes mellitus with diabetic polyneuropathy, with long-term current use of insulin (HCC)     Plan: After informed consent the area of ulceration and callus over the left first metatarsal plantar head was debrided and the patient tolerated this well.  He was debrided to bleeding tissue and this was treated with silver nitrate.  He is going to apply Bactroban ointment to this area daily after washing with Dial soap and water.  Patient continue to elevate and offload the foot as much as possible.  We also get a start some  gabapentin 300 mg at bedtime for the first week and then increase to 300 mg twice daily if he tolerates this.  He will follow-up in 3 weeks.  Follow-Up Instructions: Return in about 3 weeks (around 05/14/2018).   Ortho Exam  Patient is alert, oriented, no adenopathy, well-dressed, normal affect, normal respiratory effort. The right great toe amputation site has healed with some minimal crusting and callus formation.  He also has callus and ulceration over the plantar surface of the first metatarsal head and this was debrided with a #10 blade knife and the patient tolerated this well.  The residual areas approximately 1 cm in diameter and 1-2 millimeters in depth there is no current sign of infection or cellulitis of the foot.  He has palpable pedal pulses.  Imaging: No results found.   Labs: Lab Results  Component Value Date   HGBA1C 11.0 (A) 02/16/2018   HGBA1C 12.0 (H) 01/30/2018   HGBA1C >15.5 (H) 07/15/2017   ESRSEDRATE 5 01/30/2018   ESRSEDRATE 23 (H) 12/26/2016   ESRSEDRATE 1 12/05/2016   CRP <0.8 01/30/2018   CRP 5.8 (H) 12/26/2016   CRP <0.8 12/05/2016   REPTSTATUS 02/04/2018 FINAL 01/30/2018   GRAMSTAIN  12/26/2016  FEW WBC PRESENT, PREDOMINANTLY PMN ABUNDANT GRAM POSITIVE COCCI IN PAIRS ABUNDANT GRAM NEGATIVE RODS Performed at [facility], [address] [place], [place] [address]    CULT  01/30/2018    NO GROWTH 5 DAYS Performed at [facility], [address] [place], [place] [address]      Lab Results  Component Value Date   ALBUMIN 3.9 02/16/2018   ALBUMIN 4.0 07/14/2017   ALBUMIN 3.6 12/25/2016    Body mass index is 25.38 kg/m.  Orders:  No orders of the defined types were placed in this encounter.  Meds ordered this encounter  Medications  . mupirocin ointment (BACTROBAN) 2 %    Sig: Apply 1 application topically daily.    Dispense:  22 g    Refill:  0  . gabapentin (NEURONTIN) 300 MG capsule    Sig: Take 1 capsule (300 mg  total) by mouth at bedtime.    Dispense:  60 capsule    Refill:  11    Begin 300 mg at bedtime and if tolerates well x 1 week, increase to 300 mg BID.     Procedures: No procedures performed  Clinical Data: No additional findings.  ROS:  All other systems negative, except as noted in the HPI. Review of Systems  Objective: Vital Signs: Ht 6\' 8"  (2.032 m)   Wt 231 lb (104.8 kg)   BMI 25.38 kg/m   Specialty Comments:  No specialty comments available.  PMFS History: Patient Active Problem List   Diagnosis Date Noted  . Osteomyelitis of great toe of left foot (HCC) 01/30/2018  . Osteomyelitis (HCC) 01/30/2018  . Depression with anxiety 01/30/2018  . Major depressive disorder, recurrent severe without psychotic features (HCC) 07/16/2017  . OD (overdose of drug), intentional self-harm, initial encounter (HCC) 07/15/2017  . Cellulitis 12/26/2016  . Open toe wound, subsequent encounter 12/12/2016  . Hyperglycemia 12/06/2016  . Diabetic ulcer of left foot (HCC) 12/05/2016  . Diabetic neuropathy (HCC) 10/16/2015  . Erectile dysfunction 08/05/2015  . Low testosterone 09/12/2014  . DM type 2, uncontrolled, with neuropathy (HCC) 06/12/2014  . Diabetes mellitus, type II (HCC) 03/23/2012  . Noncompliance 03/23/2012  . Gastroenteritis and colitis, viral 03/23/2012   Past Medical History:  Diagnosis Date  . Diabetes mellitus     Family History  Problem Relation Age of Onset  . Other Mother        Healthy  . Other Father        Healthy    Past Surgical History:  Procedure Laterality Date  . AMPUTATION Left 02/02/2018   Procedure: LEFT GREAT TOE AMPUTATION;  Surgeon: Nadara Mustard, MD;  Location: [facility];  Service: Orthopedics;  Laterality: Left;   Social History   Occupational History  . Not on file  Tobacco Use  . Smoking status: Never Smoker  . Smokeless tobacco: Never Used  Substance and Sexual Activity  . Alcohol use: Yes    Alcohol/week: 2.0 standard drinks     Types: 1 Glasses of wine, 1 Cans of beer per week    Comment: occassional  . Drug use: No  . Sexual activity: Not on file

## 2018-05-10 ENCOUNTER — Other Ambulatory Visit: Payer: Self-pay | Admitting: Family Medicine

## 2018-05-10 MED ORDER — INSULIN ASPART PROT & ASPART (70-30 MIX) 100 UNIT/ML ~~LOC~~ SUSP: 35.0000 [IU] | Freq: Two times a day (BID) | SUBCUTANEOUS | 0 refills | Status: DC

## 2018-05-15 ENCOUNTER — Ambulatory Visit: Payer: Self-pay | Admitting: Family Medicine

## 2018-05-16 ENCOUNTER — Ambulatory Visit (INDEPENDENT_AMBULATORY_CARE_PROVIDER_SITE_OTHER): Payer: Self-pay | Admitting: Family

## 2018-05-28 MED FILL — !VIAGRA 100MG TABLET: 100 | 30 days supply | Qty: 10 | Fill #1

## 2018-05-28 MED FILL — GABAPENTIN 300 MG CAPSULE: 300 | 33 days supply | Qty: 60 | Fill #0

## 2018-05-28 MED FILL — !NOVOLOG MIX 70-30 FLEXPEN: 30 days supply | Qty: 21 | Fill #0

## 2018-06-26 MED FILL — !NOVOLOG MIX 70-30 FLEXPEN: 30 days supply | Qty: 21 | Fill #0

## 2018-06-27 ENCOUNTER — Other Ambulatory Visit: Payer: Self-pay

## 2018-06-27 DIAGNOSIS — E1140 Type 2 diabetes mellitus with diabetic neuropathy, unspecified: Secondary | ICD-10-CM

## 2018-06-27 DIAGNOSIS — R7989 Other specified abnormal findings of blood chemistry: Secondary | ICD-10-CM

## 2018-06-27 DIAGNOSIS — IMO0002 Reserved for concepts with insufficient information to code with codable children: Secondary | ICD-10-CM

## 2018-06-27 DIAGNOSIS — E1165 Type 2 diabetes mellitus with hyperglycemia: Principal | ICD-10-CM

## 2018-06-27 MED FILL — TRUE METRIX TEST STRIP: 25 days supply | Qty: 100 | Fill #0

## 2018-06-27 MED FILL — metFORMIN HCL 1000 MG TABS: 1000 | 30 days supply | Qty: 60 | Fill #0

## 2018-06-27 NOTE — Telephone Encounter (Signed)
Left a vm for patient letting him know that he needs to schedule a office visit before any further medication can be sent .

## 2018-07-02 ENCOUNTER — Other Ambulatory Visit: Payer: Self-pay

## 2018-07-02 ENCOUNTER — Telehealth: Payer: Self-pay

## 2018-07-02 DIAGNOSIS — IMO0002 Reserved for concepts with insufficient information to code with codable children: Secondary | ICD-10-CM

## 2018-07-02 DIAGNOSIS — R7989 Other specified abnormal findings of blood chemistry: Secondary | ICD-10-CM

## 2018-07-02 DIAGNOSIS — E1140 Type 2 diabetes mellitus with diabetic neuropathy, unspecified: Secondary | ICD-10-CM

## 2018-07-02 DIAGNOSIS — E1165 Type 2 diabetes mellitus with hyperglycemia: Principal | ICD-10-CM

## 2018-07-02 MED ORDER — GLUCOSE BLOOD VI STRP: ORAL_STRIP | 3 refills | Status: DC

## 2018-07-02 MED ORDER — METFORMIN HCL 1000 MG PO TABS: 1000.0000 mg | ORAL_TABLET | Freq: Two times a day (BID) | ORAL | 0 refills | Status: DC

## 2018-07-02 NOTE — Telephone Encounter (Signed)
Patient scheduled a appointment for 08/27/2018. Enough medication will be sent to this appointment.

## 2018-07-31 ENCOUNTER — Other Ambulatory Visit: Payer: Self-pay | Admitting: Family Medicine

## 2018-07-31 MED FILL — GABAPENTIN 300 MG CAPSULE: 300 | 33 days supply | Qty: 60 | Fill #1

## 2018-07-31 MED FILL — metFORMIN HCL 1000 MG TABS: 1000 | 30 days supply | Qty: 60 | Fill #0

## 2018-08-01 ENCOUNTER — Other Ambulatory Visit: Payer: Self-pay

## 2018-08-01 MED ORDER — INSULIN ASPART PROT & ASPART (70-30 MIX) 100 UNIT/ML ~~LOC~~ SUSP: 35.0000 [IU] | Freq: Two times a day (BID) | SUBCUTANEOUS | 2 refills | Status: DC

## 2018-08-01 NOTE — Telephone Encounter (Signed)
Medication sent to pharmacy  

## 2018-08-08 ENCOUNTER — Other Ambulatory Visit: Payer: Self-pay

## 2018-08-08 MED ORDER — INSULIN LISPRO PROT & LISPRO (75-25 MIX) 100 UNIT/ML KWIKPEN: 35.0000 [IU] | PEN_INJECTOR | Freq: Two times a day (BID) | SUBCUTANEOUS | 3 refills | Status: DC

## 2018-08-08 MED FILL — HUMALOG MIX 75-25 KWIKPEN: (75-25) 100 | 30 days supply | Qty: 21 | Fill #0

## 2018-08-08 NOTE — Progress Notes (Unsigned)
Humalog pen sent to pharmacy. Per Lanelle Bal okay to change because that was insurance will cover

## 2018-08-27 ENCOUNTER — Other Ambulatory Visit: Payer: Self-pay

## 2018-08-27 ENCOUNTER — Encounter: Payer: Self-pay | Admitting: Family Medicine

## 2018-08-27 ENCOUNTER — Ambulatory Visit (INDEPENDENT_AMBULATORY_CARE_PROVIDER_SITE_OTHER): Payer: Self-pay | Admitting: Family Medicine

## 2018-08-27 VITALS — BP 122/74 | HR 76 | Temp 98.1°F | Ht >= 80 in | Wt 268.0 lb

## 2018-08-27 DIAGNOSIS — R7309 Other abnormal glucose: Secondary | ICD-10-CM | POA: Insufficient documentation

## 2018-08-27 DIAGNOSIS — E118 Type 2 diabetes mellitus with unspecified complications: Secondary | ICD-10-CM

## 2018-08-27 DIAGNOSIS — S98112A Complete traumatic amputation of left great toe, initial encounter: Secondary | ICD-10-CM | POA: Insufficient documentation

## 2018-08-27 DIAGNOSIS — E1140 Type 2 diabetes mellitus with diabetic neuropathy, unspecified: Secondary | ICD-10-CM

## 2018-08-27 DIAGNOSIS — R7989 Other specified abnormal findings of blood chemistry: Secondary | ICD-10-CM

## 2018-08-27 DIAGNOSIS — E1165 Type 2 diabetes mellitus with hyperglycemia: Secondary | ICD-10-CM

## 2018-08-27 DIAGNOSIS — Z09 Encounter for follow-up examination after completed treatment for conditions other than malignant neoplasm: Secondary | ICD-10-CM

## 2018-08-27 DIAGNOSIS — Z794 Long term (current) use of insulin: Secondary | ICD-10-CM

## 2018-08-27 DIAGNOSIS — IMO0002 Reserved for concepts with insufficient information to code with codable children: Secondary | ICD-10-CM

## 2018-08-27 DIAGNOSIS — N522 Drug-induced erectile dysfunction: Secondary | ICD-10-CM

## 2018-08-27 DIAGNOSIS — N529 Male erectile dysfunction, unspecified: Secondary | ICD-10-CM

## 2018-08-27 LAB — POCT URINALYSIS DIP (MANUAL ENTRY)
Bilirubin, UA: NEGATIVE
Blood, UA: NEGATIVE
Glucose, UA: 500 mg/dL — AB
Ketones, POC UA: NEGATIVE mg/dL
Leukocytes, UA: NEGATIVE
Nitrite, UA: NEGATIVE
Protein Ur, POC: NEGATIVE mg/dL
Spec Grav, UA: 1.025 (ref 1.010–1.025)
Urobilinogen, UA: 0.2 E.U./dL
pH, UA: 6 (ref 5.0–8.0)

## 2018-08-27 LAB — POCT GLYCOSYLATED HEMOGLOBIN (HGB A1C): Hemoglobin A1C: 10.6 % — AB (ref 4.0–5.6)

## 2018-08-27 LAB — GLUCOSE, POCT (MANUAL RESULT ENTRY): POC Glucose: 88 mg/dl (ref 70–99)

## 2018-08-27 NOTE — Progress Notes (Signed)
Patient [facility]  Established Patient Office Visit  Subjective:  Patient ID: Jasman Murri, male    DOB: [birth year]  Age: 38 y.o. MRN: [id number]  CC:  Chief Complaint  Patient presents with  . Follow-up    Chronic condition     HPI Jerrik Housholder is a 38 year old male who presents for Follow Up today.   Past Medical History:  Diagnosis Date  . Diabetes mellitus    Current Status: Since his last office visit, he is doing well with no complaints. He states that he has not injected insulin in 3 weeks, because of financial restraints. He will pick up medication today. His most recent normal range of preprandial blood glucose levels have been between 110-125. He has seen low range of 39, which he drank orange juice and high of 220 since his last office visit. He denies fatigue, frequent urination, blurred vision, excessive hunger, excessive thirst, weight gain, weight loss, and poor wound healing. He continues to check his feet regularly.   He denies fevers, chills, recent infections, weight loss, and night sweats. He has not had any headaches,  dizziness, and falls. No chest pain, heart palpitations, cough and shortness of breath reported. No reports of GI problems such as nausea, vomiting, diarrhea, and constipation. He has no reports of blood in stools, dysuria and hematuria. No depression or anxiety reported. He denies pain today.   Past Surgical History:  Procedure Laterality Date  . AMPUTATION Left 02/02/2018   Procedure: LEFT GREAT TOE AMPUTATION;  Surgeon: Newt Minion, MD;  Location: [facility];  Service: Orthopedics;  Laterality: Left;    Family History  Problem Relation Age of Onset  . Other Mother        Healthy  . Other Father        Healthy    Social History   Socioeconomic History  . Marital status: Single    Spouse name: Not on file  . Number of children: Not on file  . Years of education: Not on file  . Highest  education level: Not on file  Occupational History  . Not on file  Social Needs  . Financial resource strain: Not on file  . Food insecurity    Worry: Not on file    Inability: Not on file  . Transportation needs    Medical: Not on file    Non-medical: Not on file  Tobacco Use  . Smoking status: Never Smoker  . Smokeless tobacco: Never Used  Substance and Sexual Activity  . Alcohol use: Yes    Alcohol/week: 2.0 standard drinks    Types: 1 Glasses of wine, 1 Cans of beer per week    Comment: occassional  . Drug use: No  . Sexual activity: Not on file  Lifestyle  . Physical activity    Days per week: Not on file    Minutes per session: Not on file  . Stress: Not on file  Relationships  . Social Herbalist on phone: Not on file    Gets together: Not on file    Attends religious service: Not on file    Active member of club or organization: Not on file    Attends meetings of clubs or organizations: Not on file    Relationship status: Not on file  . Intimate partner violence    Fear of current or ex partner: Not on file    Emotionally abused:  Not on file    Physically abused: Not on file    Forced sexual activity: Not on file  Other Topics Concern  . Not on file  Social History Narrative  . Not on file    Outpatient Medications Prior to Visit  Medication Sig Dispense Refill  . blood glucose meter kit and supplies Dispense based on patient and insurance preference. Use up to four times daily as directed. (FOR ICD-10 E10.9, E11.9). 1 each 0  . gabapentin (NEURONTIN) 300 MG capsule Take 1 capsule (300 mg total) by mouth at bedtime. 60 capsule 11  . glucose blood test strip Use test strips to check blood sugar four times daily as directed 100 each 3  . ibuprofen (ADVIL,MOTRIN) 800 MG tablet Take 1 tablet (800 mg total) by mouth every 8 (eight) hours as needed. (Patient taking differently: Take 800 mg by mouth every 8 (eight) hours as needed for moderate pain. ) 30  tablet 2  . Insulin Lispro Prot & Lispro (HUMALOG 75/25 MIX) (75-25) 100 UNIT/ML Kwikpen Inject 35 Units into the skin 2 (two) times daily. 21 mL 3  . metFORMIN (GLUCOPHAGE) 1000 MG tablet Take 1 tablet (1,000 mg total) by mouth 2 (two) times daily. 180 tablet 0  . sildenafil (VIAGRA) 100 MG tablet Take 1 tablet (100 mg total) by mouth daily as needed for erectile dysfunction. 10 tablet 2  . insulin aspart protamine- aspart (NOVOLOG MIX 70/30) (70-30) 100 UNIT/ML injection Inject 0.35 mLs (35 Units total) into the skin 2 (two) times daily with a meal for 30 days. 21 mL 2  . atorvastatin (LIPITOR) 40 MG tablet Take 40 mg by mouth daily.  3  . amoxicillin (AMOXIL) 500 MG capsule Take 1 capsule (500 mg total) by mouth 2 (two) times daily. 20 capsule 0  . mupirocin ointment (BACTROBAN) 2 % Apply 1 application topically daily. (Patient not taking: Reported on 08/27/2018) 22 g 0   No facility-administered medications prior to visit.     No Known Allergies  ROS Review of Systems  Constitutional: Negative.   HENT: Negative.   Eyes: Negative.   Respiratory: Negative.   Cardiovascular: Negative.   Gastrointestinal: Negative.   Endocrine: Negative.   Genitourinary: Negative.   Musculoskeletal: Negative.   Skin: Negative.   Allergic/Immunologic: Negative.   Neurological: Negative.   Hematological: Negative.   Psychiatric/Behavioral: Negative.       Objective:    Physical Exam  Constitutional: He is oriented to person, place, and time. He appears well-developed and well-nourished.  HENT:  Head: Normocephalic and atraumatic.  Eyes: Conjunctivae are normal.  Neck: Normal range of motion. Neck supple.  Cardiovascular: Normal rate, regular rhythm, normal heart sounds and intact distal pulses.  Pulmonary/Chest: Effort normal and breath sounds normal.  Abdominal: Soft. Bowel sounds are normal.  Musculoskeletal:     Comments: Left great toe amputation  Neurological: He is alert and oriented  to person, place, and time. He has normal reflexes.  Skin: Skin is warm and dry.  Psychiatric: He has a normal mood and affect. His behavior is normal. Judgment and thought content normal.  Nursing note and vitals reviewed.   BP 122/74 (BP Location: Right Arm, Patient Position: Sitting, Cuff Size: Small)   Pulse 76   Temp 98.1 F (36.7 C) (Oral)   Ht '6\' 8"'  (2.032 m)   Wt 268 lb (121.6 kg)   SpO2 100%   BMI 29.44 kg/m  Wt Readings from Last 3 Encounters:  08/27/18 268 lb (  121.6 kg)  04/23/18 231 lb (104.8 kg)  04/09/18 231 lb (104.8 kg)     Health Maintenance Due  Topic Date Due  . PNEUMOCOCCAL POLYSACCHARIDE VACCINE AGE 64-64 HIGH RISK  01/20/1984  . OPHTHALMOLOGY EXAM  01/20/1992  . FOOT EXAM  10/15/2016  . HEMOGLOBIN A1C  08/18/2018    There are no preventive care reminders to display for this patient.  Lab Results  Component Value Date   TSH 1.000 02/16/2018   Lab Results  Component Value Date   WBC 6.5 03/10/2018   HGB 12.7 (L) 03/10/2018   HCT 42.1 03/10/2018   MCV 82.2 03/10/2018   PLT 160 03/10/2018   Lab Results  Component Value Date   NA 141 03/10/2018   K 3.7 03/10/2018   CO2 24 03/10/2018   GLUCOSE 128 (H) 03/10/2018   BUN 8 03/10/2018   CREATININE 0.74 03/10/2018   BILITOT 0.3 02/16/2018   ALKPHOS 77 02/16/2018   AST 8 02/16/2018   ALT 16 02/16/2018   PROT 6.9 02/16/2018   ALBUMIN 3.9 02/16/2018   CALCIUM 9.0 03/10/2018   ANIONGAP 11 03/10/2018   Lab Results  Component Value Date   CHOL 79 (L) 02/16/2018   Lab Results  Component Value Date   HDL 37 (L) 02/16/2018   Lab Results  Component Value Date   LDLCALC 27 02/16/2018   Lab Results  Component Value Date   TRIG 77 02/16/2018   Lab Results  Component Value Date   CHOLHDL 2.1 02/16/2018   Lab Results  Component Value Date   HGBA1C 10.6 (A) 08/27/2018      Assessment & Plan:   1. Type 2 diabetes mellitus with complication, with long-term current use of insulin (HCC)  - POCT glycosylated hemoglobin (Hb A1C) - POCT urinalysis dipstick - POCT glucose (manual entry)  2. DM type 2, uncontrolled, with neuropathy (HCC) Continue Gabapentin as prescribed.   3. Drug-induced erectile dysfunction  4. Hemoglobin A1C greater than 9%, indicating poor diabetic control Improved. Hgb A1c at 10.6 today, decreased from 12.0 on 01/30/2018. Continue medications as prescribed. He will continue to decrease foods/beverages high in sugars and carbs and follow Heart Healthy or DASH diet. Increase physical activity to at least 30 minutes cardio exercise daily.   5. Erectile dysfunction, unspecified erectile dysfunction type Continue Sildenafil as prescribed.   6. Low testosterone  7. Amputation of left great toe (HCC) Stable. Well healed-scar.  8. Follow up He will follow up in 6 months.   No orders of the defined types were placed in this encounter.   Orders Placed This Encounter  Procedures  . POCT glycosylated hemoglobin (Hb A1C)  . POCT urinalysis dipstick  . POCT glucose (manual entry)    Referral Orders  No referral(s) requested today    Kathe Becton,  MSN, FNP-BC Patient [facility] [place], [place] [phone]  Problem List Items Addressed This Visit      Endocrine   DM type 2, uncontrolled, with neuropathy ([employer])     Other   Erectile dysfunction   Low testosterone    Other Visit Diagnoses    Type 2 diabetes mellitus with complication, with long-term current use of insulin ([place])    -  Primary   Relevant Orders   POCT glycosylated hemoglobin (Hb A1C) (Completed)   POCT urinalysis dipstick (Completed)   POCT glucose (manual entry) (Completed)   Hemoglobin A1C greater than 9%, indicating poor diabetic control  Amputation of left great toe ([place])       Follow up          No orders of the defined types were placed in this encounter.   Follow-up: Return in about 6 months (around  02/26/2019).    Azzie Glatter, FNP

## 2018-09-11 ENCOUNTER — Encounter: Payer: Self-pay | Admitting: Family Medicine

## 2018-09-18 ENCOUNTER — Telehealth: Payer: Self-pay

## 2018-09-18 MED FILL — HUMALOG MIX 75-25 KWIKPEN: (75-25) 100 | 30 days supply | Qty: 21 | Fill #1

## 2018-09-18 MED FILL — SILDENAFIL CITRATE 100 MG T: 100 | 25 days supply | Qty: 6 | Fill #2

## 2018-09-18 MED FILL — metFORMIN HCL 1000 MG TABS: 1000 | 30 days supply | Qty: 60 | Fill #1

## 2018-09-20 ENCOUNTER — Other Ambulatory Visit: Payer: Self-pay | Admitting: Family Medicine

## 2018-09-20 NOTE — Telephone Encounter (Signed)
Patient states that he is suppose to be prescribed a medication that he can take every day for the Erectile dysfunction. Patient would like medication to be sent to [facility]

## 2018-10-03 ENCOUNTER — Telehealth: Payer: Self-pay

## 2018-10-03 ENCOUNTER — Other Ambulatory Visit: Payer: Self-pay | Admitting: Family Medicine

## 2018-10-03 DIAGNOSIS — N529 Male erectile dysfunction, unspecified: Secondary | ICD-10-CM

## 2018-10-03 MED ORDER — TADALAFIL 10 MG PO TABS: 10.0000 mg | ORAL_TABLET | Freq: Every day | ORAL | 0 refills | Status: DC | PRN

## 2018-10-03 NOTE — Telephone Encounter (Signed)
Left a vm for patient to callback 

## 2018-10-03 NOTE — Telephone Encounter (Signed)
-----   Message from Jonathan [name], [place] sent at 10/03/2018  2:31 PM EDT ----- Regarding: "New Rx" New Rx for Cialis sent to pharmacy today. Please inform patient.

## 2018-10-29 MED FILL — metFORMIN HCL 1000 MG TABS: 1000 | 30 days supply | Qty: 60 | Fill #2

## 2018-10-29 MED FILL — TADALAFIL 10 MG TABS: 10 | 23 days supply | Qty: 6 | Fill #0

## 2018-10-29 MED FILL — HUMALOG MIX 75-25 KWIKPEN: (75-25) 100 | 30 days supply | Qty: 21 | Fill #2

## 2018-10-29 MED FILL — GABAPENTIN 300 MG CAPSULE: 300 | 30 days supply | Qty: 60 | Fill #2

## 2018-11-12 MED FILL — HUMALOG MIX 75-25 KWIKPEN: (75-25) 100 | 30 days supply | Qty: 21 | Fill #2

## 2018-11-12 MED FILL — metFORMIN HCL 1000 MG TABS: 1000 | 30 days supply | Qty: 60 | Fill #2

## 2018-11-12 MED FILL — TADALAFIL 10 MG TABS: 10 | 23 days supply | Qty: 6 | Fill #0

## 2018-11-12 MED FILL — GABAPENTIN 300 MG CAPSULE: 300 | 30 days supply | Qty: 60 | Fill #2

## 2018-12-24 ENCOUNTER — Other Ambulatory Visit: Payer: Self-pay

## 2018-12-24 DIAGNOSIS — E1140 Type 2 diabetes mellitus with diabetic neuropathy, unspecified: Secondary | ICD-10-CM

## 2018-12-24 DIAGNOSIS — R7989 Other specified abnormal findings of blood chemistry: Secondary | ICD-10-CM

## 2018-12-24 DIAGNOSIS — IMO0002 Reserved for concepts with insufficient information to code with codable children: Secondary | ICD-10-CM

## 2018-12-24 MED ORDER — METFORMIN HCL 1000 MG PO TABS: 1000.0000 mg | ORAL_TABLET | Freq: Two times a day (BID) | ORAL | 0 refills | Status: DC

## 2018-12-24 MED FILL — HUMALOG MIX 75-25 KWIKPEN: (75-25) 100 | 30 days supply | Qty: 21 | Fill #3

## 2018-12-24 MED FILL — metFORMIN HCL 1000 MG TABS: 1000 | 30 days supply | Qty: 60 | Fill #0

## 2018-12-24 MED FILL — GABAPENTIN 300 MG CAPSULE: 300 | 30 days supply | Qty: 60 | Fill #3

## 2018-12-24 MED FILL — TADALAFIL 10 MG TABS: 10 | 15 days supply | Qty: 4 | Fill #1

## 2019-01-14 ENCOUNTER — Other Ambulatory Visit: Payer: Self-pay

## 2019-01-14 ENCOUNTER — Ambulatory Visit (INDEPENDENT_AMBULATORY_CARE_PROVIDER_SITE_OTHER): Payer: Self-pay | Admitting: Family Medicine

## 2019-01-14 ENCOUNTER — Encounter: Payer: Self-pay | Admitting: Family Medicine

## 2019-01-14 VITALS — BP 128/73 | HR 79 | Temp 98.1°F | Ht >= 80 in | Wt 284.8 lb

## 2019-01-14 DIAGNOSIS — Z09 Encounter for follow-up examination after completed treatment for conditions other than malignant neoplasm: Secondary | ICD-10-CM

## 2019-01-14 DIAGNOSIS — N529 Male erectile dysfunction, unspecified: Secondary | ICD-10-CM

## 2019-01-14 DIAGNOSIS — G629 Polyneuropathy, unspecified: Secondary | ICD-10-CM

## 2019-01-14 DIAGNOSIS — R7309 Other abnormal glucose: Secondary | ICD-10-CM

## 2019-01-14 DIAGNOSIS — E118 Type 2 diabetes mellitus with unspecified complications: Secondary | ICD-10-CM

## 2019-01-14 DIAGNOSIS — Z794 Long term (current) use of insulin: Secondary | ICD-10-CM

## 2019-01-14 LAB — GLUCOSE, POCT (MANUAL RESULT ENTRY): POC Glucose: 98 mg/dl (ref 70–99)

## 2019-01-14 LAB — POCT GLYCOSYLATED HEMOGLOBIN (HGB A1C): Hemoglobin A1C: 9 % — AB (ref 4.0–5.6)

## 2019-01-14 LAB — POCT URINALYSIS DIPSTICK
Bilirubin, UA: NEGATIVE
Glucose, UA: NEGATIVE
Ketones, UA: NEGATIVE
Leukocytes, UA: NEGATIVE
Nitrite, UA: NEGATIVE
Protein, UA: NEGATIVE
Spec Grav, UA: >=1.03 — AB (ref 1.010–1.025)
Urobilinogen, UA: 0.2 E.U./dL
pH, UA: 5.5 (ref 5.0–8.0)

## 2019-01-14 MED ORDER — SILDENAFIL CITRATE 100 MG PO TABS: 100.0000 mg | ORAL_TABLET | Freq: Every day | ORAL | 0 refills | Status: DC | PRN

## 2019-01-14 MED ORDER — ATORVASTATIN CALCIUM 10 MG PO TABS: 10.0000 mg | ORAL_TABLET | Freq: Every day | ORAL | 6 refills | Status: DC

## 2019-01-14 MED FILL — SILDENAFIL CITRATE 100 MG T: 100 | 23 days supply | Qty: 6 | Fill #0

## 2019-01-14 MED FILL — ATORVASTATIN 10 MG TABLET: 10 | 30 days supply | Qty: 30 | Fill #0

## 2019-01-14 NOTE — Progress Notes (Signed)
Patient [facility]    Established Patient Office Visit  Subjective:  Patient ID: Rondale Nies, male    DOB: 27-Dec-1980  Age: 38 y.o. MRN: [id number]  CC:  Chief Complaint  Patient presents with  . Follow-up    6 mth follow up DM    HPI Klark Vanderhoef is a 38 year old male who presents for Follow Up today.   Past Medical History:  Diagnosis Date  . Diabetes mellitus    Current Status: Since his last office visit, he is doing well with no complaints.  His most recent normal range of preprandial blood glucose levels have been between 150-175. He has seen low range of 80 and high of 290 since his last office visit. He states that Cialis is not effective. He denies fatigue, frequent urination, blurred vision, excessive hunger, excessive thirst, weight gain, weight loss, and poor wound healing. He continues to check his feet regularly. He denies fevers, chills, recent infections, weight loss, and night sweats. He has not had any headaches, dizziness, and falls. No chest pain, heart palpitations, cough and shortness of breath reported. No reports of GI problems such as nausea, vomiting, diarrhea, and constipation. He has no reports of blood in stools, dysuria and hematuria. No depression or anxiety reported today. He denies pain today.   Past Surgical History:  Procedure Laterality Date  . AMPUTATION Left 02/02/2018   Procedure: LEFT GREAT TOE AMPUTATION;  Surgeon: Newt Minion, MD;  Location: [facility];  Service: Orthopedics;  Laterality: Left;    Family History  Problem Relation Age of Onset  . Other Mother        Healthy  . Other Father        Healthy    Social History   Socioeconomic History  . Marital status: Single    Spouse name: Not on file  . Number of children: Not on file  . Years of education: Not on file  . Highest education level: Not on file  Occupational History  . Not on file  Social Needs  . Financial resource  strain: Not on file  . Food insecurity    Worry: Not on file    Inability: Not on file  . Transportation needs    Medical: Not on file    Non-medical: Not on file  Tobacco Use  . Smoking status: Never Smoker  . Smokeless tobacco: Never Used  Substance and Sexual Activity  . Alcohol use: Yes    Alcohol/week: 2.0 standard drinks    Types: 1 Glasses of wine, 1 Cans of beer per week    Comment: occassional  . Drug use: No  . Sexual activity: Yes  Lifestyle  . Physical activity    Days per week: Not on file    Minutes per session: Not on file  . Stress: Not on file  Relationships  . Social Herbalist on phone: Not on file    Gets together: Not on file    Attends religious service: Not on file    Active member of club or organization: Not on file    Attends meetings of clubs or organizations: Not on file    Relationship status: Not on file  . Intimate partner violence    Fear of current or ex partner: Not on file    Emotionally abused: Not on file    Physically abused: Not on file    Forced sexual activity: Not on  file  Other Topics Concern  . Not on file  Social History Narrative  . Not on file    Outpatient Medications Prior to Visit  Medication Sig Dispense Refill  . blood glucose meter kit and supplies Dispense based on patient and insurance preference. Use up to four times daily as directed. (FOR ICD-10 E10.9, E11.9). 1 each 0  . gabapentin (NEURONTIN) 300 MG capsule Take 1 capsule (300 mg total) by mouth at bedtime. 60 capsule 11  . glucose blood test strip Use test strips to check blood sugar four times daily as directed 100 each 3  . ibuprofen (ADVIL,MOTRIN) 800 MG tablet Take 1 tablet (800 mg total) by mouth every 8 (eight) hours as needed. (Patient taking differently: Take 800 mg by mouth every 8 (eight) hours as needed for moderate pain. ) 30 tablet 2  . Insulin Lispro Prot & Lispro (HUMALOG 75/25 MIX) (75-25) 100 UNIT/ML Kwikpen Inject 35 Units into the  skin 2 (two) times daily. 21 mL 3  . metFORMIN (GLUCOPHAGE) 1000 MG tablet Take 1 tablet (1,000 mg total) by mouth 2 (two) times daily. 180 tablet 0  . atorvastatin (LIPITOR) 40 MG tablet Take 40 mg by mouth daily.  3  . tadalafil (CIALIS) 10 MG tablet Take 1 tablet (10 mg total) by mouth daily as needed for erectile dysfunction. (Patient not taking: Reported on 01/14/2019) 10 tablet 0   No facility-administered medications prior to visit.     No Known Allergies  ROS Review of Systems  Constitutional: Negative.   HENT: Negative.   Eyes: Negative.   Respiratory: Negative.   Cardiovascular: Negative.   Gastrointestinal: Negative.   Endocrine: Negative.   Genitourinary: Negative.        Erectile dysfunction  Musculoskeletal: Negative.   Allergic/Immunologic: Negative.   Neurological: Positive for numbness (numbness/tingling in extremities).  Hematological: Negative.   Psychiatric/Behavioral: Negative.       Objective:    Physical Exam  Constitutional: He is oriented to person, place, and time. He appears well-developed and well-nourished.  HENT:  Head: Normocephalic and atraumatic.  Eyes: Conjunctivae are normal.  Neck: Normal range of motion. Neck supple.  Cardiovascular: Normal rate, normal heart sounds and intact distal pulses.  Pulmonary/Chest: Effort normal and breath sounds normal.  Abdominal: Soft. Bowel sounds are normal.  Musculoskeletal: Normal range of motion.  Neurological: He is alert and oriented to person, place, and time. He has normal reflexes.  Peripheral neuropathy  Skin: Skin is warm and dry.  Psychiatric: He has a normal mood and affect. His behavior is normal. Judgment and thought content normal.  Nursing note and vitals reviewed.   BP 128/73   Pulse 79   Temp 98.1 F (36.7 C) (Oral)   Ht '6\' 8"'  (2.032 m)   Wt 284 lb 12.8 oz (129.2 kg)   SpO2 98%   BMI 31.29 kg/m  Wt Readings from Last 3 Encounters:  01/14/19 284 lb 12.8 oz (129.2 kg)   08/27/18 268 lb (121.6 kg)  04/23/18 231 lb (104.8 kg)     Health Maintenance Due  Topic Date Due  . PNEUMOCOCCAL POLYSACCHARIDE VACCINE AGE 9-64 HIGH RISK  01/20/1984  . OPHTHALMOLOGY EXAM  01/20/1992  . FOOT EXAM  10/15/2016  . INFLUENZA VACCINE  09/29/2018  . URINE MICROALBUMIN  02/17/2019    There are no preventive care reminders to display for this patient.  Lab Results  Component Value Date   TSH 1.000 02/16/2018   Lab Results  Component Value  Date   WBC 6.5 03/10/2018   HGB 12.7 (L) 03/10/2018   HCT 42.1 03/10/2018   MCV 82.2 03/10/2018   PLT 160 03/10/2018   Lab Results  Component Value Date   NA 141 03/10/2018   K 3.7 03/10/2018   CO2 24 03/10/2018   GLUCOSE 128 (H) 03/10/2018   BUN 8 03/10/2018   CREATININE 0.74 03/10/2018   BILITOT 0.3 02/16/2018   ALKPHOS 77 02/16/2018   AST 8 02/16/2018   ALT 16 02/16/2018   PROT 6.9 02/16/2018   ALBUMIN 3.9 02/16/2018   CALCIUM 9.0 03/10/2018   ANIONGAP 11 03/10/2018   Lab Results  Component Value Date   CHOL 79 (L) 02/16/2018   Lab Results  Component Value Date   HDL 37 (L) 02/16/2018   Lab Results  Component Value Date   LDLCALC 27 02/16/2018   Lab Results  Component Value Date   TRIG 77 02/16/2018   Lab Results  Component Value Date   CHOLHDL 2.1 02/16/2018   Lab Results  Component Value Date   HGBA1C 9.0 (A) 01/14/2019      Assessment & Plan:   1. Type 2 diabetes mellitus with complication, with long-term current use of insulin (HCC) The current medical regimen is effective; blood glucose level is stable at 98 today; continue present plan and medications as prescribed. She will continue medication as prescribed, to decrease foods/beverages high in sugars and carbs and follow Heart Healthy or DASH diet. Increase physical activity to at least 30 minutes cardio exercise daily.  - POCT HgB A1C - Urinalysis Dipstick - Glucose (CBG) - atorvastatin (LIPITOR) 10 MG tablet; Take 1 tablet (10 mg  total) by mouth daily.  Dispense: 30 tablet; Refill: 6  2. Hemoglobin A1C greater than 9%, indicating poor diabetic control Hgb A1c at 9.0 today. Monitor.   3. Neuropathy Stable today. Continue Gabapentin as prescribed.   4. Erectile dysfunction, unspecified erectile dysfunction type Cialis not effective. We will initiate Sildenafil today.  - sildenafil (VIAGRA) 100 MG tablet; Take 1 tablet (100 mg total) by mouth daily as needed for erectile dysfunction.  Dispense: 10 tablet; Refill: 0  5. Follow up He will follow up in 3 months.   Meds ordered this encounter  Medications  . atorvastatin (LIPITOR) 10 MG tablet    Sig: Take 1 tablet (10 mg total) by mouth daily.    Dispense:  30 tablet    Refill:  6  . sildenafil (VIAGRA) 100 MG tablet    Sig: Take 1 tablet (100 mg total) by mouth daily as needed for erectile dysfunction.    Dispense:  10 tablet    Refill:  0    Orders Placed This Encounter  Procedures  . POCT HgB A1C  . Urinalysis Dipstick  . Glucose (CBG)    Referral Orders  No referral(s) requested today    Kathe Becton,  MSN, FNP-BC [address], [place] [address] [phone] fax   Problem List Items Addressed This Visit      Endocrine   Hemoglobin A1C greater than 9%, indicating poor diabetic control   Relevant Medications   atorvastatin (LIPITOR) 10 MG tablet     Other   Erectile dysfunction   Relevant Medications   sildenafil (VIAGRA) 100 MG tablet    Other Visit Diagnoses    Type 2 diabetes mellitus with complication, with long-term current use of insulin ([place])    -  Primary   Relevant Medications   atorvastatin (LIPITOR) 10 MG tablet   Other Relevant Orders   POCT HgB A1C (Completed)   Urinalysis Dipstick (Completed)   Glucose (CBG) (Completed)   Neuropathy       Follow up          Meds ordered this encounter  Medications  .  atorvastatin (LIPITOR) 10 MG tablet    Sig: Take 1 tablet (10 mg total) by mouth daily.    Dispense:  30 tablet    Refill:  6  . sildenafil (VIAGRA) 100 MG tablet    Sig: Take 1 tablet (100 mg total) by mouth daily as needed for erectile dysfunction.    Dispense:  10 tablet    Refill:  0    Follow-up: Return in about 3 months (around 04/16/2019).    Azzie Glatter, FNP

## 2019-01-29 DIAGNOSIS — S98112A Complete traumatic amputation of left great toe, initial encounter: Secondary | ICD-10-CM

## 2019-01-29 HISTORY — DX: Complete traumatic amputation of left great toe, initial encounter: S98.112A

## 2019-01-30 ENCOUNTER — Other Ambulatory Visit: Payer: Self-pay | Admitting: Family Medicine

## 2019-01-30 MED FILL — GABAPENTIN 300 MG CAPSULE: 300 | 30 days supply | Qty: 60 | Fill #4

## 2019-01-30 MED FILL — ATORVASTATIN 10 MG TABLET: 10 | 30 days supply | Qty: 30 | Fill #0

## 2019-01-30 MED FILL — SILDENAFIL CITRATE 100 MG T: 100 | 23 days supply | Qty: 6 | Fill #0

## 2019-01-30 MED FILL — HUMALOG MIX 75-25 KWIKPEN: (75-25) 100 | 30 days supply | Qty: 21 | Fill #0

## 2019-01-30 MED FILL — metFORMIN HCL 1000 MG TABS: 1000 | 30 days supply | Qty: 60 | Fill #1

## 2019-02-05 ENCOUNTER — Telehealth: Payer: Self-pay | Admitting: Orthopedic Surgery

## 2019-02-05 NOTE — Telephone Encounter (Signed)
Called patient left voicemail message to return call to schedule an appointment with Dr Berneda Rose [facility] for swollen foot per [employer]

## 2019-02-06 ENCOUNTER — Ambulatory Visit: Payer: Self-pay | Admitting: Physician Assistant

## 2019-02-07 ENCOUNTER — Other Ambulatory Visit: Payer: Self-pay

## 2019-02-07 ENCOUNTER — Ambulatory Visit (INDEPENDENT_AMBULATORY_CARE_PROVIDER_SITE_OTHER): Payer: No Typology Code available for payment source | Admitting: Orthopedic Surgery

## 2019-02-07 ENCOUNTER — Other Ambulatory Visit: Payer: Self-pay | Admitting: Physician Assistant

## 2019-02-07 ENCOUNTER — Ambulatory Visit: Payer: Self-pay

## 2019-02-07 ENCOUNTER — Encounter: Payer: Self-pay | Admitting: Physician Assistant

## 2019-02-07 VITALS — Ht >= 80 in | Wt 284.0 lb

## 2019-02-07 DIAGNOSIS — R7309 Other abnormal glucose: Secondary | ICD-10-CM

## 2019-02-07 DIAGNOSIS — Z89412 Acquired absence of left great toe: Secondary | ICD-10-CM

## 2019-02-07 DIAGNOSIS — M869 Osteomyelitis, unspecified: Secondary | ICD-10-CM

## 2019-02-07 DIAGNOSIS — E11621 Type 2 diabetes mellitus with foot ulcer: Secondary | ICD-10-CM

## 2019-02-07 DIAGNOSIS — M86272 Subacute osteomyelitis, left ankle and foot: Secondary | ICD-10-CM

## 2019-02-07 DIAGNOSIS — L02612 Cutaneous abscess of left foot: Secondary | ICD-10-CM | POA: Diagnosis not present

## 2019-02-07 DIAGNOSIS — L97524 Non-pressure chronic ulcer of other part of left foot with necrosis of bone: Secondary | ICD-10-CM

## 2019-02-07 NOTE — Progress Notes (Addendum)
I was unable to reach patient by phone.  I left  A message on voice mail.  I instructed the patient to arrive at [facility] entrance at  9:30 am , register in the [facility]. Go to [facility] testing site enroute to the hospital. I instructed patient to not eat anything after midnight.I informed patient that he may clear liquids until 0900. I told patient clear liquids are water, soda, cranberry juice apple juice, black coffee. I instructed the patient to take the following medications in the am : none.  I asked patient to not wear any lotions, powders, cologne, jewelry, piercing, make-up or nail polish.  Wear clean clothes. Brush teeth.    I instructed  patient to call [phone], in the am if there were any questions or problems. I encouraged patient to call pre- surgery desk with any questions, if he is not sure what to drink call the desk.  Mr Meints has type II diabetes, I instructed patient if he has not taken 70/25 Insulin this evening, to only take 70%. I instructed patient to not take Insulin  or Metformin in am. I instructed patient to check CBG after awaking and every 2 hours until arrival  to the hospital.  I Instructed patient if CBG is less than 70 to drink 1/2 cup of a clear juice. Recheck CBG in 15 minutes then call pre- op desk at [phone] for further instructions. If scheduled to receive Insulin, do not take Insulin.

## 2019-02-07 NOTE — Progress Notes (Signed)
Office Visit Note   Patient: Jonathan [name]           Date of Birth: [birth year]           MRN: [id number] Visit Date: 02/07/2019              Requested by: Kallie Locks, FNP [address] [place],  [place] [address] PCP: Kallie Locks, FNP  Chief Complaint  Patient presents with  . Left Foot - Pain    Hx GT amputation 02/02/2018      HPI: Patient is a 38 year old gentleman who is about a year status post amputation of the left great toe at the MTP joint.  Patient states he has noticed a several month history of ulceration drainage and odor.  Patient states that his hemoglobin A1c has improved and is about a 9.  Patient complains of bloody drainage odor or maceration.  Assessment & Plan: Visit Diagnoses:  1. Status post amputation of left great toe (HCC)   2. Subacute osteomyelitis, left ankle and foot (HCC)   3. Cutaneous abscess of left foot     Plan: Plan: We will plan for a first ray amputation left foot tomorrow outpatient surgery nonweightbearing postoperatively.  Risk and benefits were discussed including risk of the wound not healing need for additional surgery.  Follow-Up Instructions: Return in about 2 weeks (around 02/21/2019).   Ortho Exam  Patient is alert, oriented, no adenopathy, well-dressed, normal affect, normal respiratory effort. Examination patient has a good dorsalis pedis pulse his foot is warm to the touch he has a foul-smelling draining ulcer on the plantar aspect of the first metatarsal head.  After informed consent a 10 blade knife was used to debride the skin and soft tissue back to healthy viable granulation tissue.  The ulcer is 2 cm in depth and probes all the way to bone the ulcer is 5 cm in diameter.  Imaging: XR Foot Complete Left  Result Date: 02/07/2019 Three-view radiographs of the left foot shows small cortical lucency on the plantar aspect first metatarsal head.  No images are attached to the encounter.  Labs: Lab  Results  Component Value Date   HGBA1C 9.0 (A) 01/14/2019   HGBA1C 10.6 (A) 08/27/2018   HGBA1C 11.0 (A) 02/16/2018   ESRSEDRATE 5 01/30/2018   ESRSEDRATE 23 (H) 12/26/2016   ESRSEDRATE 1 12/05/2016   CRP <0.8 01/30/2018   CRP 5.8 (H) 12/26/2016   CRP <0.8 12/05/2016   REPTSTATUS 02/04/2018 FINAL 01/30/2018   GRAMSTAIN  12/26/2016    FEW WBC PRESENT, PREDOMINANTLY PMN ABUNDANT GRAM POSITIVE COCCI IN PAIRS ABUNDANT GRAM NEGATIVE RODS Performed at [facility], [address], [place], [place] [address]    CULT  01/30/2018    NO GROWTH 5 DAYS Performed at [facility], [address], [place], [place] [address]      Lab Results  Component Value Date   ALBUMIN 3.9 02/16/2018   ALBUMIN 4.0 07/14/2017   ALBUMIN 3.6 12/25/2016    No results found for: MG No results found for: VD25OH  No results found for: PREALBUMIN CBC EXTENDED Latest Ref Rng & Units 03/10/2018 02/16/2018 02/01/2018  WBC 4.0 - 10.5 K/uL 6.5 6.0 4.5  RBC 4.22 - 5.81 MIL/uL 5.12 5.60 5.70  HGB 13.0 - 17.0 g/dL 12.7(L) 13.8 13.9  HCT 39.0 - 52.0 % 42.1 43.1 45.7  PLT 150 - 400 K/uL 160 215 166  NEUTROABS 1.7 - 7.7 K/uL 3.9 3.6  1.4(L)  LYMPHSABS 0.7 - 4.0 K/uL 1.9 1.9 2.7     Body mass index is 31.2 kg/m.  Orders:  Orders Placed This Encounter  Procedures  . XR Foot Complete Left   No orders of the defined types were placed in this encounter.    Procedures: No procedures performed  Clinical Data: No additional findings.  ROS:  All other systems negative, except as noted in the HPI. Review of Systems  Objective: Vital Signs: Ht 6\' 8"  (2.032 m)   Wt 284 lb (128.8 kg)   BMI 31.20 kg/m   Specialty Comments:  No specialty comments available.  PMFS History: Patient Active Problem List   Diagnosis Date Noted  . Hemoglobin A1C greater than 9%, indicating poor diabetic control 08/27/2018  . Amputation of left great toe ([place]) 08/27/2018  . Osteomyelitis of great toe of left  foot ([place]) 01/30/2018  . Osteomyelitis ([place]) 01/30/2018  . Depression with anxiety 01/30/2018  . Major depressive disorder, recurrent severe without psychotic features ([place]) 07/16/2017  . OD (overdose of drug), intentional self-harm, initial encounter ([place]) 07/15/2017  . Cellulitis 12/26/2016  . Open toe wound, subsequent encounter 12/12/2016  . Hyperglycemia 12/06/2016  . Diabetic ulcer of left foot ([place]) 12/05/2016  . Diabetic neuropathy ([place]) 10/16/2015  . Erectile dysfunction 08/05/2015  . Low testosterone 09/12/2014  . DM type 2, uncontrolled, with neuropathy ([place]) 06/12/2014  . Diabetes mellitus, type II ([place]) 03/23/2012  . Noncompliance 03/23/2012  . Gastroenteritis and colitis, viral 03/23/2012   Past Medical History:  Diagnosis Date  . Diabetes mellitus     Family History  Problem Relation Age of Onset  . Other Mother        Healthy  . Other Father        Healthy    Past Surgical History:  Procedure Laterality Date  . AMPUTATION Left 02/02/2018   Procedure: LEFT GREAT TOE AMPUTATION;  Surgeon: Newt Minion, MD;  Location: [facility];  Service: Orthopedics;  Laterality: Left;   Social History   Occupational History  . Not on file  Tobacco Use  . Smoking status: Never Smoker  . Smokeless tobacco: Never Used  Substance and Sexual Activity  . Alcohol use: Yes    Alcohol/week: 2.0 standard drinks    Types: 1 Glasses of wine, 1 Cans of beer per week    Comment: occassional  . Drug use: No  . Sexual activity: Yes

## 2019-02-08 ENCOUNTER — Other Ambulatory Visit (HOSPITAL_COMMUNITY)
Admission: RE | Admit: 2019-02-08 | Discharge: 2019-02-08 | Disposition: A | Discharge status: Home / Self Care | Payer: No Typology Code available for payment source | Source: Ambulatory Visit | Attending: Orthopedic Surgery | Admitting: Orthopedic Surgery

## 2019-02-08 ENCOUNTER — Ambulatory Visit (HOSPITAL_COMMUNITY): Payer: No Typology Code available for payment source | Admitting: Anesthesiology

## 2019-02-08 ENCOUNTER — Other Ambulatory Visit: Payer: Self-pay

## 2019-02-08 ENCOUNTER — Ambulatory Visit (HOSPITAL_COMMUNITY)
Admission: RE | Admit: 2019-02-08 | Discharge: 2019-02-08 | Disposition: A | Discharge status: Home / Self Care | Payer: No Typology Code available for payment source | Attending: Orthopedic Surgery | Admitting: Orthopedic Surgery

## 2019-02-08 ENCOUNTER — Other Ambulatory Visit: Payer: Self-pay | Admitting: Physician Assistant

## 2019-02-08 ENCOUNTER — Encounter (HOSPITAL_COMMUNITY)
Admission: RE | Disposition: A | Discharge status: Home / Self Care | Payer: Self-pay | Source: Home / Self Care | Attending: Orthopedic Surgery

## 2019-02-08 DIAGNOSIS — E1140 Type 2 diabetes mellitus with diabetic neuropathy, unspecified: Secondary | ICD-10-CM | POA: Insufficient documentation

## 2019-02-08 DIAGNOSIS — L02612 Cutaneous abscess of left foot: Secondary | ICD-10-CM | POA: Diagnosis not present

## 2019-02-08 DIAGNOSIS — Z20828 Contact with and (suspected) exposure to other viral communicable diseases: Secondary | ICD-10-CM | POA: Insufficient documentation

## 2019-02-08 DIAGNOSIS — M869 Osteomyelitis, unspecified: Secondary | ICD-10-CM | POA: Diagnosis present

## 2019-02-08 DIAGNOSIS — E1169 Type 2 diabetes mellitus with other specified complication: Secondary | ICD-10-CM | POA: Diagnosis not present

## 2019-02-08 DIAGNOSIS — Z794 Long term (current) use of insulin: Secondary | ICD-10-CM | POA: Diagnosis not present

## 2019-02-08 DIAGNOSIS — Z89412 Acquired absence of left great toe: Secondary | ICD-10-CM | POA: Diagnosis not present

## 2019-02-08 DIAGNOSIS — M86272 Subacute osteomyelitis, left ankle and foot: Secondary | ICD-10-CM | POA: Insufficient documentation

## 2019-02-08 DIAGNOSIS — L97524 Non-pressure chronic ulcer of other part of left foot with necrosis of bone: Secondary | ICD-10-CM | POA: Diagnosis not present

## 2019-02-08 HISTORY — PX: AMPUTATION: SHX166

## 2019-02-08 LAB — GLUCOSE, CAPILLARY
Glucose-Capillary: 287 mg/dL — ABNORMAL HIGH (ref 70–99)
Glucose-Capillary: 240 mg/dL — ABNORMAL HIGH (ref 70–99)
Glucose-Capillary: 340 mg/dL — ABNORMAL HIGH (ref 70–99)
Glucose-Capillary: 330 mg/dL — ABNORMAL HIGH (ref 70–99)

## 2019-02-08 LAB — RESPIRATORY PANEL BY RT PCR (FLU A&B, COVID)
Influenza A by PCR: NEGATIVE
Influenza B by PCR: NEGATIVE
SARS Coronavirus 2 by RT PCR: NEGATIVE

## 2019-02-08 SURGERY — AMPUTATION, FOOT, RAY
Anesthesia: General | Laterality: Left

## 2019-02-08 MED ORDER — OXYCODONE-ACETAMINOPHEN 5-325 MG PO TABS: 1.0000 | ORAL_TABLET | ORAL | 0 refills | Status: DC | PRN

## 2019-02-08 MED ORDER — FENTANYL CITRATE (PF) 100 MCG/2ML IJ SOLN: 25.0000 ug | INTRAMUSCULAR | Status: DC | PRN

## 2019-02-08 MED ORDER — DEXTROSE 5 % IV SOLN
3.0000 g | INTRAVENOUS | Status: AC
  Administered 2019-02-08: 3 g via INTRAVENOUS
  Filled 2019-02-08: qty 3

## 2019-02-08 MED ORDER — DEXAMETHASONE SODIUM PHOSPHATE 10 MG/ML IJ SOLN
INTRAMUSCULAR | Status: DC | PRN
  Administered 2019-02-08: 5 mg via INTRAVENOUS

## 2019-02-08 MED ORDER — INSULIN ASPART 100 UNIT/ML ~~LOC~~ SOLN
10.0000 [IU] | Freq: Once | SUBCUTANEOUS | Status: AC
  Administered 2019-02-08: 10 [IU] via SUBCUTANEOUS

## 2019-02-08 MED ORDER — INSULIN ASPART 100 UNIT/ML ~~LOC~~ SOLN
SUBCUTANEOUS | Status: AC
  Filled 2019-02-08: qty 1

## 2019-02-08 MED ORDER — CHLORHEXIDINE GLUCONATE 4 % EX LIQD: 60.0000 mL | Freq: Once | CUTANEOUS | Status: DC

## 2019-02-08 MED ORDER — MIDAZOLAM HCL 2 MG/2ML IJ SOLN
INTRAMUSCULAR | Status: AC
  Filled 2019-02-08: qty 2

## 2019-02-08 MED ORDER — MIDAZOLAM HCL 5 MG/5ML IJ SOLN
INTRAMUSCULAR | Status: DC | PRN
  Administered 2019-02-08 (×2): 2 mg via INTRAVENOUS

## 2019-02-08 MED ORDER — OXYCODONE HCL 5 MG PO TABS
ORAL_TABLET | ORAL | Status: AC
  Filled 2019-02-08: qty 1

## 2019-02-08 MED ORDER — FENTANYL CITRATE (PF) 100 MCG/2ML IJ SOLN
INTRAMUSCULAR | Status: DC | PRN
  Administered 2019-02-08 (×3): 50 ug via INTRAVENOUS

## 2019-02-08 MED ORDER — ACETAMINOPHEN 325 MG PO TABS: 325.0000 mg | ORAL_TABLET | ORAL | Status: DC | PRN

## 2019-02-08 MED ORDER — LIDOCAINE 2% (20 MG/ML) 5 ML SYRINGE
INTRAMUSCULAR | Status: DC | PRN
  Administered 2019-02-08: 100 mg via INTRAVENOUS

## 2019-02-08 MED ORDER — MEPERIDINE HCL 25 MG/ML IJ SOLN: 6.2500 mg | INTRAMUSCULAR | Status: DC | PRN

## 2019-02-08 MED ORDER — OXYCODONE HCL 5 MG/5ML PO SOLN: 5.0000 mg | Freq: Once | ORAL | Status: AC | PRN

## 2019-02-08 MED ORDER — LACTATED RINGERS IV SOLN
INTRAVENOUS | Status: DC
  Administered 2019-02-08: 10:00:00 via INTRAVENOUS

## 2019-02-08 MED ORDER — CEFAZOLIN SODIUM 1 G IJ SOLR
INTRAMUSCULAR | Status: AC
  Filled 2019-02-08: qty 20

## 2019-02-08 MED ORDER — ONDANSETRON HCL 4 MG/2ML IJ SOLN
INTRAMUSCULAR | Status: DC | PRN
  Administered 2019-02-08: 4 mg via INTRAVENOUS

## 2019-02-08 MED ORDER — INSULIN ASPART 100 UNIT/ML ~~LOC~~ SOLN
5.0000 [IU] | Freq: Once | SUBCUTANEOUS | Status: AC
  Administered 2019-02-08: 5 [IU] via SUBCUTANEOUS

## 2019-02-08 MED ORDER — OXYCODONE HCL 5 MG PO TABS
5.0000 mg | ORAL_TABLET | Freq: Once | ORAL | Status: AC | PRN
  Administered 2019-02-08: 5 mg via ORAL

## 2019-02-08 MED ORDER — ACETAMINOPHEN 160 MG/5ML PO SOLN: 325.0000 mg | ORAL | Status: DC | PRN

## 2019-02-08 MED ORDER — FENTANYL CITRATE (PF) 250 MCG/5ML IJ SOLN
INTRAMUSCULAR | Status: AC
  Filled 2019-02-08: qty 5

## 2019-02-08 MED ORDER — ONDANSETRON HCL 4 MG/2ML IJ SOLN: 4.0000 mg | Freq: Once | INTRAMUSCULAR | Status: DC | PRN

## 2019-02-08 MED ORDER — 0.9 % SODIUM CHLORIDE (POUR BTL) OPTIME
TOPICAL | Status: DC | PRN
  Administered 2019-02-08: 1000 mL

## 2019-02-08 MED ORDER — PROPOFOL 10 MG/ML IV BOLUS
INTRAVENOUS | Status: DC | PRN
  Administered 2019-02-08: 200 mg via INTRAVENOUS

## 2019-02-08 SURGICAL SUPPLY — 27 items
BLADE SAW SGTL MED 73X18.5 STR (BLADE) IMPLANT
BLADE SURG 21 STRL SS (BLADE) ×3 IMPLANT
BNDG COHESIVE 4X5 TAN STRL (GAUZE/BANDAGES/DRESSINGS) ×3 IMPLANT
BNDG GAUZE ELAST 4 BULKY (GAUZE/BANDAGES/DRESSINGS) ×3 IMPLANT
COVER SURGICAL LIGHT HANDLE (MISCELLANEOUS) ×4 IMPLANT
DRAPE U-SHAPE 47X51 STRL (DRAPES) ×6 IMPLANT
DRSG ADAPTIC 3X8 NADH LF (GAUZE/BANDAGES/DRESSINGS) ×3 IMPLANT
DURAPREP 26ML APPLICATOR (WOUND CARE) ×3 IMPLANT
ELECT REM PT RETURN 9FT ADLT (ELECTROSURGICAL) ×3
ELECTRODE REM PT RTRN 9FT ADLT (ELECTROSURGICAL) ×1 IMPLANT
GAUZE SPONGE 4X4 12PLY STRL (GAUZE/BANDAGES/DRESSINGS) ×3 IMPLANT
GLOVE BIOGEL PI IND STRL 9 (GLOVE) ×1 IMPLANT
GLOVE BIOGEL PI INDICATOR 9 (GLOVE) ×2
GLOVE SURG ORTHO 9.0 STRL STRW (GLOVE) ×3 IMPLANT
GOWN STRL REUS W/ TWL XL LVL3 (GOWN DISPOSABLE) ×2 IMPLANT
GOWN STRL REUS W/TWL XL LVL3 (GOWN DISPOSABLE) ×6
KIT BASIN OR (CUSTOM PROCEDURE TRAY) ×3 IMPLANT
KIT TURNOVER KIT B (KITS) ×3 IMPLANT
NS IRRIG 1000ML POUR BTL (IV SOLUTION) ×3 IMPLANT
PACK ORTHO EXTREMITY (CUSTOM PROCEDURE TRAY) ×3 IMPLANT
PAD ARMBOARD 7.5X6 YLW CONV (MISCELLANEOUS) ×6 IMPLANT
STOCKINETTE IMPERVIOUS LG (DRAPES) IMPLANT
SUT ETHILON 2 0 PSLX (SUTURE) ×3 IMPLANT
TOWEL GREEN STERILE (TOWEL DISPOSABLE) ×3 IMPLANT
TUBE CONNECTING 12'X1/4 (SUCTIONS) ×1
TUBE CONNECTING 12X1/4 (SUCTIONS) ×2 IMPLANT
YANKAUER SUCT BULB TIP NO VENT (SUCTIONS) ×3 IMPLANT

## 2019-02-08 NOTE — Anesthesia Preprocedure Evaluation (Signed)
Anesthesia Evaluation  Patient identified by MRN, date of birth, ID band Patient awake    Reviewed: Allergy & Precautions, Patient's Chart, lab work & pertinent test results  History of Anesthesia Complications Negative for: history of anesthetic complications  Airway Mallampati: II  TM Distance: >3 FB Neck ROM: Full    Dental no notable dental hx.    Pulmonary neg pulmonary ROS,    Pulmonary exam normal        Cardiovascular negative cardio ROS Normal cardiovascular exam     Neuro/Psych negative neurological ROS  negative psych ROS   GI/Hepatic negative GI ROS, Neg liver ROS,   Endo/Other  negative endocrine ROSdiabetes, Poorly Controlled, Type 2, Insulin Dependent, Oral Hypoglycemic Agents  Renal/GU negative Renal ROS  negative genitourinary   Musculoskeletal negative musculoskeletal ROS (+)   Abdominal   Peds  Hematology negative hematology ROS (+)   Anesthesia Other Findings   Reproductive/Obstetrics                             Anesthesia Physical  Anesthesia Plan  ASA: III  Anesthesia Plan: General   Post-op Pain Management:    Induction: Intravenous  PONV Risk Score and Plan: 1 and Propofol infusion and Treatment may vary due to age or medical condition  Airway Management Planned: Oral ETT and LMA  Additional Equipment: None  Intra-op Plan:   Post-operative Plan: Extubation in OR  Informed Consent: I have reviewed the patients History and Physical, chart, labs and discussed the procedure including the risks, benefits and alternatives for the proposed anesthesia with the patient or authorized representative who has indicated his/her understanding and acceptance.       Plan Discussed with: CRNA, Surgeon and Anesthesiologist  Anesthesia Plan Comments: ( )        Anesthesia Quick Evaluation

## 2019-02-08 NOTE — Op Note (Signed)
02/08/2019  12:41 PM  PATIENT:  Jonathan [name]    PRE-OPERATIVE DIAGNOSIS:  Osteomyelitis, Abscess Left Foot  POST-OPERATIVE DIAGNOSIS:  Same  PROCEDURE:  LEFT FOOT 1ST RAY AMPUTATION Local tissue rearrangement for wound closure 10 x 5 cm.  SURGEON:  Newt Minion, MD  PHYSICIAN ASSISTANT:None ANESTHESIA:   General  PREOPERATIVE INDICATIONS:  Jonathan [name] is a  38 y.o. male with a diagnosis of Osteomyelitis, Abscess Left Foot who failed conservative measures and elected for surgical management.    The risks benefits and alternatives were discussed with the patient preoperatively including but not limited to the risks of infection, bleeding, nerve injury, cardiopulmonary complications, the need for revision surgery, among others, and the patient was willing to proceed.  OPERATIVE IMPLANTS: None  @ENCIMAGES @  OPERATIVE FINDINGS: Large soft tissue defect with abscess.  OPERATIVE PROCEDURE: Patient was brought the operating room underwent general anesthetic.  After adequate levels anesthesia were obtained patient's left lower extremity was prepped using DuraPrep draped into a sterile field a timeout was called.  A racquet incision was made around the ulcerative tissue plantarly as well as around the first metatarsal head.  This left a wound that was 10 x 5 cm.  The first ray was resected through the base.  The tissue margins were resected back with a 21 blade knife to get to healthy viable tissue there was not involved with the abscess.  The wound was irrigated with normal saline electrocautery was used for hemostasis.  Local tissue rearrangement was used to close the wound 10 x 5 cm.  Sterile dressing was applied patient was extubated taken the PACU in stable condition.   DISCHARGE PLANNING:  Antibiotic duration: Preoperative antibiotics  Weightbearing: Touchdown weightbearing on the left  Pain medication: Prescription for Percocet  Dressing care/ Wound VAC: Follow-up in the  office in 1 week to change the dressing  Ambulatory devices: Crutches  Discharge to: Home.  Follow-up: In the office 1 week post operative.

## 2019-02-08 NOTE — Progress Notes (Signed)
Lab work not necessary today per Dr Luane School

## 2019-02-08 NOTE — Progress Notes (Signed)
Orthopedic Tech Progress Note Patient Details:  Jonathan [name] [birth year] [id number] PACU RN called requesting crutches and a post op shoe for patient Ortho Devices Type of Ortho Device: Crutches, Postop shoe/boot Ortho Device/Splint Location: LLE Ortho Device/Splint Interventions: Application, Ordered   Post Interventions Patient Tolerated: Ambulated well, Well Instructions Provided: Poper ambulation with device, Care of device, Adjustment of device   Janit Pagan 02/08/2019, 5:38 PM

## 2019-02-08 NOTE — Anesthesia Procedure Notes (Signed)
Procedure Name: LMA Insertion Date/Time: 02/08/2019 12:12 PM Performed by: Imagene Riches, CRNA Pre-anesthesia Checklist: Patient identified, Emergency Drugs available, Suction available and Patient being monitored Patient Re-evaluated:Patient Re-evaluated prior to induction Oxygen Delivery Method: Circle System Utilized Preoxygenation: Pre-oxygenation with 100% oxygen Induction Type: IV induction Ventilation: Mask ventilation without difficulty LMA: LMA inserted LMA Size: 5.0 Number of attempts: 1 Airway Equipment and Method: Bite block Placement Confirmation: positive ETCO2 Tube secured with: Tape Dental Injury: Teeth and Oropharynx as per pre-operative assessment

## 2019-02-08 NOTE — Transfer of Care (Signed)
Immediate Anesthesia Transfer of Care Note  Patient: Jonathan [name]  Procedure(s) Performed: LEFT FOOT 1ST RAY AMPUTATION (Left )  Patient Location: PACU  Anesthesia Type:General  Level of Consciousness: awake, alert  and oriented  Airway & Oxygen Therapy: Patient Spontanous Breathing and Patient connected to nasal cannula oxygen  Post-op Assessment: Report given to RN and Post -op Vital signs reviewed and stable  Post vital signs: Reviewed and stable  Last Vitals:  Vitals Value Taken Time  BP 112/72 02/08/19 1244  Temp    Pulse 80 02/08/19 1250  Resp 17 02/08/19 1250  SpO2 100 % 02/08/19 1250  Vitals shown include unvalidated device data.  Last Pain: There were no vitals filed for this visit.       Complications: No apparent anesthesia complications

## 2019-02-08 NOTE — H&P (Signed)
Jonathan [name] is an 38 y.o. male.   Chief Complaint: Left great Toe Osteomyelitis HPI: Patient is a 38 year old gentleman who is about a year status post amputation of the left great toe at the MTP joint.  Patient states he has noticed a several month history of ulceration drainage and odor.  Patient states that his hemoglobin A1c has improved and is about a 9.  Patient complains of bloody drainage odor or maceration.  Past Medical History:  Diagnosis Date  . Diabetes mellitus     Past Surgical History:  Procedure Laterality Date  . AMPUTATION Left 02/02/2018   Procedure: LEFT GREAT TOE AMPUTATION;  Surgeon: Newt Minion, MD;  Location: [facility];  Service: Orthopedics;  Laterality: Left;    Family History  Problem Relation Age of Onset  . Other Mother        Healthy  . Other Father        Healthy   Social History:  reports that he has never smoked. He has never used smokeless tobacco. He reports current alcohol use of about 2.0 standard drinks of alcohol per week. He reports that he does not use drugs.  Allergies: No Known Allergies  No medications prior to admission.    No results found for this or any previous visit (from the past 48 hour(s)). XR Foot Complete Left  Result Date: 02/07/2019 Three-view radiographs of the left foot shows small cortical lucency on the plantar aspect first metatarsal head.   Review of Systems  All other systems reviewed and are negative.   There were no vitals taken for this visit. Physical Exam   Patient is alert, oriented, no adenopathy, well-dressed, normal affect, normal respiratory effort. Examination patient has a good dorsalis pedis pulse his foot is warm to the touch he has a foul-smelling draining ulcer on the plantar aspect of the first metatarsal head.  After informed consent a 10 blade knife was used to debride the skin and soft tissue back to healthy viable granulation tissue.  The ulcer is 2 cm in depth and probes all the way  to bone the ulcer is 5 cm in diameter. Assessment/Plan Visit Diagnoses:  1. Status post amputation of left great toe (HCC)   2. Subacute osteomyelitis, left ankle and foot ([place])   3. Cutaneous abscess of left foot     Plan: Plan: We will plan for a first ray amputation left foot tomorrow outpatient surgery nonweightbearing postoperatively.  Risk and benefits were discussed including risk of the wound not healing need for additional surgery.    Bevely Palmer Ginna Schuur, PA 02/08/2019, 7:21 AM

## 2019-02-10 NOTE — Addendum Note (Signed)
Addendum  created 02/10/19 1936 by Janeece Riggers, MD   Clinical Note Signed

## 2019-02-10 NOTE — Anesthesia Postprocedure Evaluation (Signed)
Anesthesia Post Note  Patient: Jonathan [name]  Procedure(s) Performed: LEFT FOOT 1ST RAY AMPUTATION (Left )     Anesthesia Post Evaluation  Last Vitals:  Vitals:   02/08/19 1626 02/08/19 1815  BP: 132/85 (P) 128/86  Pulse: 83 (P) 83  Resp: 18 (P) 18  Temp:  (P) 36.9 C  SpO2: 98% (P) 96%    Last Pain:  Vitals:   02/08/19 1815  PainSc: (P) 4                  Niyah Mamaril

## 2019-02-10 NOTE — Anesthesia Postprocedure Evaluation (Signed)
Anesthesia Post Note  Patient: Jonathan [name]  Procedure(s) Performed: LEFT FOOT 1ST RAY AMPUTATION (Left )     Anesthesia Post Evaluation  Last Vitals:  Vitals:   02/08/19 1626 02/08/19 1815  BP: 132/85 (P) 128/86  Pulse: 83 (P) 83  Resp: 18 (P) 18  Temp:  (P) 36.9 C  SpO2: 98% (P) 96%    Last Pain:  Vitals:   02/08/19 1815  PainSc: (P) 4                  Aydien Majette     

## 2019-02-14 ENCOUNTER — Ambulatory Visit (INDEPENDENT_AMBULATORY_CARE_PROVIDER_SITE_OTHER): Payer: No Typology Code available for payment source | Admitting: Physician Assistant

## 2019-02-14 ENCOUNTER — Other Ambulatory Visit: Payer: Self-pay

## 2019-02-14 ENCOUNTER — Encounter: Payer: Self-pay | Admitting: Physician Assistant

## 2019-02-14 VITALS — Ht >= 80 in | Wt 284.0 lb

## 2019-02-14 DIAGNOSIS — Z89412 Acquired absence of left great toe: Secondary | ICD-10-CM

## 2019-02-14 MED ORDER — DOXYCYCLINE HYCLATE 100 MG PO TABS: 100.0000 mg | ORAL_TABLET | Freq: Two times a day (BID) | ORAL | 0 refills | Status: DC

## 2019-02-14 NOTE — Progress Notes (Signed)
Office Visit Note   Patient: Jonathan [name]           Date of Birth: 20-May-1980           MRN: [id number] Visit Date: 02/14/2019              Requested by: Azzie Glatter, [place],  [place] [address] PCP: Azzie Glatter, FNP  Chief Complaint  Patient presents with  . Left Foot - Routine Post Op    02/08/19 left foot 1st ray amputation       HPI: This is a pleasant gentleman who is 1 week status post left foot first ray amputation he was unable to use crutches and has been walking on his foot he does notice a slight odor.  No fever or chills   Assessment & Plan: Visit Diagnoses: No diagnosis found.  Plan: He is concerned about using crutches as he feels he will fall.  I did discuss the possibility of a knee scooter with him.  He understands that he needs to decrease the swelling in his foot in order for it to heal properly I did put him on a course of doxycycline he will follow up in a week or so  Follow-Up Instructions: No follow-ups on file.   Ortho Exam  Patient is alert, oriented, no adenopathy, well-dressed, normal affect, normal respiratory effort. Left foot: Towards the distal end of the wound he does have some dehiscence with some necrotic tissue and a mild odor.  There is no fluctuance or abscess.  Moderate amount of soft tissue swelling no cellulitis  Imaging: No results found. No images are attached to the encounter.  Labs: Lab Results  Component Value Date   HGBA1C 9.0 (A) 01/14/2019   HGBA1C 10.6 (A) 08/27/2018   HGBA1C 11.0 (A) 02/16/2018   ESRSEDRATE 5 01/30/2018   ESRSEDRATE 23 (H) 12/26/2016   ESRSEDRATE 1 12/05/2016   CRP <0.8 01/30/2018   CRP 5.8 (H) 12/26/2016   CRP <0.8 12/05/2016   REPTSTATUS 02/04/2018 FINAL 01/30/2018   GRAMSTAIN  12/26/2016    FEW WBC PRESENT, PREDOMINANTLY PMN ABUNDANT GRAM POSITIVE COCCI IN PAIRS ABUNDANT GRAM NEGATIVE RODS Performed at [facility], [place] [address] [place],  [place] [address]    CULT  01/30/2018    NO GROWTH 5 DAYS Performed at [facility] [address], [place], [place] [address]      Lab Results  Component Value Date   ALBUMIN 3.9 02/16/2018   ALBUMIN 4.0 07/14/2017   ALBUMIN 3.6 12/25/2016    No results found for: MG No results found for: VD25OH  No results found for: PREALBUMIN CBC EXTENDED Latest Ref Rng & Units 03/10/2018 02/16/2018 02/01/2018  WBC 4.0 - 10.5 K/uL 6.5 6.0 4.5  RBC 4.22 - 5.81 MIL/uL 5.12 5.60 5.70  HGB 13.0 - 17.0 g/dL 12.7(L) 13.8 13.9  HCT 39.0 - 52.0 % 42.1 43.1 45.7  PLT 150 - 400 K/uL 160 215 166  NEUTROABS 1.7 - 7.7 K/uL 3.9 3.6 1.4(L)  LYMPHSABS 0.7 - 4.0 K/uL 1.9 1.9 2.7     Body mass index is 31.2 kg/m.  Orders:  No orders of the defined types were placed in this encounter.  Meds ordered this encounter  Medications  . doxycycline (VIBRA-TABS) 100 MG tablet    Sig: Take 1 tablet (100 mg total) by mouth 2 (two) times daily.    Dispense:  30 tablet    Refill:  0     Procedures: No procedures performed  Clinical Data: No additional findings.  ROS:  All other systems negative, except as noted in the HPI. Review of Systems  Objective: Vital Signs: Ht 6\' 8"  (2.032 m)   Wt 284 lb (128.8 kg)   BMI 31.20 kg/m   Specialty Comments:  No specialty comments available.  PMFS History: Patient Active Problem List   Diagnosis Date Noted  . Non-pressure chronic ulcer of other part of left foot with necrosis of bone (HCC)   . Cutaneous abscess of left foot   . Hemoglobin A1C greater than 9%, indicating poor diabetic control 08/27/2018  . Amputation of left great toe (HCC) 08/27/2018  . Osteomyelitis of great toe of left foot (HCC) 01/30/2018  . Osteomyelitis (HCC) 01/30/2018  . Depression with anxiety 01/30/2018  . Major depressive disorder, recurrent severe without psychotic features (HCC) 07/16/2017  . OD (overdose of drug), intentional self-harm, initial encounter (HCC) 07/15/2017    . Cellulitis 12/26/2016  . Open toe wound, subsequent encounter 12/12/2016  . Hyperglycemia 12/06/2016  . Diabetic ulcer of left foot (HCC) 12/05/2016  . Diabetic neuropathy (HCC) 10/16/2015  . Erectile dysfunction 08/05/2015  . Low testosterone 09/12/2014  . DM type 2, uncontrolled, with neuropathy (HCC) 06/12/2014  . Diabetes mellitus, type II (HCC) 03/23/2012  . Noncompliance 03/23/2012  . Gastroenteritis and colitis, viral 03/23/2012   Past Medical History:  Diagnosis Date  . Diabetes mellitus     Family History  Problem Relation Age of Onset  . Other Mother        Healthy  . Other Father        Healthy    Past Surgical History:  Procedure Laterality Date  . AMPUTATION Left 02/02/2018   Procedure: LEFT GREAT TOE AMPUTATION;  Surgeon: 14/07/2017, MD;  Location: [facility] OR;  Service: Orthopedics;  Laterality: Left;  . AMPUTATION Left 02/08/2019   Procedure: LEFT FOOT 1ST RAY AMPUTATION;  Surgeon: 14/12/2018, MD;  Location: [facility] OR;  Service: Orthopedics;  Laterality: Left;   Social History   Occupational History  . Not on file  Tobacco Use  . Smoking status: Never Smoker  . Smokeless tobacco: Never Used  Substance and Sexual Activity  . Alcohol use: Yes    Alcohol/week: 2.0 standard drinks    Types: 1 Glasses of wine, 1 Cans of beer per week    Comment: occassional  . Drug use: No  . Sexual activity: Yes

## 2019-02-28 ENCOUNTER — Ambulatory Visit: Payer: No Typology Code available for payment source | Admitting: Physician Assistant

## 2019-03-04 ENCOUNTER — Telehealth: Payer: Self-pay | Admitting: Orthopedic Surgery

## 2019-03-04 NOTE — Telephone Encounter (Signed)
Called patient left message to return to schedule an appointment with Dr Lajoyce Corners, Nita Sells or Denny Peon for suture removal per request from [facility]

## 2019-03-05 ENCOUNTER — Ambulatory Visit (INDEPENDENT_AMBULATORY_CARE_PROVIDER_SITE_OTHER): Payer: No Typology Code available for payment source | Admitting: Physician Assistant

## 2019-03-05 ENCOUNTER — Other Ambulatory Visit: Payer: Self-pay

## 2019-03-05 ENCOUNTER — Encounter: Payer: Self-pay | Admitting: Physician Assistant

## 2019-03-05 DIAGNOSIS — Z89412 Acquired absence of left great toe: Secondary | ICD-10-CM

## 2019-03-05 MED ORDER — DOXYCYCLINE HYCLATE 100 MG PO TABS: 100.0000 mg | ORAL_TABLET | Freq: Two times a day (BID) | ORAL | 0 refills | Status: DC

## 2019-03-05 MED FILL — GABAPENTIN 300 MG CAPSULE: 300 | 30 days supply | Qty: 60 | Fill #5

## 2019-03-05 MED FILL — metFORMIN HCL 1000 MG TABS: 1000 | 30 days supply | Qty: 60 | Fill #2

## 2019-03-05 MED FILL — SILDENAFIL CITRATE 100 MG T: 100 | 15 days supply | Qty: 4 | Fill #1

## 2019-03-05 MED FILL — ATORVASTATIN 10 MG TABLET: 10 | 30 days supply | Qty: 30 | Fill #1

## 2019-03-05 MED FILL — HUMALOG MIX 75-25 KWIKPEN: (75-25) 100 | 30 days supply | Qty: 21 | Fill #1

## 2019-03-05 NOTE — Progress Notes (Signed)
Post-Op Visit Note   Patient: Jonathan [name]           Date of Birth: 07-Nov-1980           MRN: [id number] Visit Date: 03/05/2019 PCP: Kallie Locks, FNP  Chief Complaint:  Chief Complaint  Patient presents with  . Left Foot - Routine Post Op    HPI:  HPI The patient is a 39 year old gentleman seen today 3 weeks status post left foot first ray amputation his sutures do remain in place.  He has been full weightbearing in regular shoewear.  He continues on doxycycline.  Requests a refill.  He still has 1 remaining open area that is having moderate drainage with an odor  Ortho Exam On examination of the right foot incision is healing well overall distally there is a quarter sized area that has not yet healed there is granulation in the wound bed very mild drainage today on exam a mild odor.  There is no cellulitis of the foot.  Visit Diagnoses:  1. Status post amputation of left great toe (HCC)     Plan: He the sutures were harvested today without incident he will begin mupirocin dressing changes daily did refill his doxycycline he will follow-up once more in 2 weeks.  Follow-Up Instructions: Return in about 2 weeks (around 03/19/2019).   Imaging: No results found.  Orders:  No orders of the defined types were placed in this encounter.  Meds ordered this encounter  Medications  . doxycycline (VIBRA-TABS) 100 MG tablet    Sig: Take 1 tablet (100 mg total) by mouth 2 (two) times daily.    Dispense:  28 tablet    Refill:  0     PMFS History: Patient Active Problem List   Diagnosis Date Noted  . Non-pressure chronic ulcer of other part of left foot with necrosis of bone (HCC)   . Cutaneous abscess of left foot   . Hemoglobin A1C greater than 9%, indicating poor diabetic control 08/27/2018  . Amputation of left great toe (HCC) 08/27/2018  . Osteomyelitis of great toe of left foot (HCC) 01/30/2018  . Osteomyelitis (HCC) 01/30/2018  . Depression with anxiety  01/30/2018  . Major depressive disorder, recurrent severe without psychotic features (HCC) 07/16/2017  . OD (overdose of drug), intentional self-harm, initial encounter (HCC) 07/15/2017  . Cellulitis 12/26/2016  . Open toe wound, subsequent encounter 12/12/2016  . Hyperglycemia 12/06/2016  . Diabetic ulcer of left foot (HCC) 12/05/2016  . Diabetic neuropathy (HCC) 10/16/2015  . Erectile dysfunction 08/05/2015  . Low testosterone 09/12/2014  . DM type 2, uncontrolled, with neuropathy (HCC) 06/12/2014  . Diabetes mellitus, type II (HCC) 03/23/2012  . Noncompliance 03/23/2012  . Gastroenteritis and colitis, viral 03/23/2012   Past Medical History:  Diagnosis Date  . Diabetes mellitus     Family History  Problem Relation Age of Onset  . Other Mother        Healthy  . Other Father        Healthy    Past Surgical History:  Procedure Laterality Date  . AMPUTATION Left 02/02/2018   Procedure: LEFT GREAT TOE AMPUTATION;  Surgeon: Nadara Mustard, MD;  Location: [facility] OR;  Service: Orthopedics;  Laterality: Left;  . AMPUTATION Left 02/08/2019   Procedure: LEFT FOOT 1ST RAY AMPUTATION;  Surgeon: Nadara Mustard, MD;  Location[facility] OR;  Service: Orthopedics;  Laterality: Left;   Social History   Occupational History  . Not on file  Tobacco  Use  . Smoking status: Never Smoker  . Smokeless tobacco: Never Used  Substance and Sexual Activity  . Alcohol use: Yes    Alcohol/week: 2.0 standard drinks    Types: 1 Glasses of wine, 1 Cans of beer per week    Comment: occassional  . Drug use: No  . Sexual activity: Yes

## 2019-03-19 ENCOUNTER — Ambulatory Visit: Payer: No Typology Code available for payment source | Admitting: Physician Assistant

## 2019-03-25 ENCOUNTER — Other Ambulatory Visit: Payer: Self-pay

## 2019-03-25 ENCOUNTER — Encounter: Payer: Self-pay | Admitting: Physician Assistant

## 2019-03-25 ENCOUNTER — Ambulatory Visit (INDEPENDENT_AMBULATORY_CARE_PROVIDER_SITE_OTHER): Payer: No Typology Code available for payment source | Admitting: Physician Assistant

## 2019-03-25 VITALS — Ht >= 80 in | Wt 284.0 lb

## 2019-03-25 DIAGNOSIS — M869 Osteomyelitis, unspecified: Secondary | ICD-10-CM

## 2019-03-25 NOTE — Progress Notes (Signed)
Office Visit Note   Patient: Jonathan [name]           Date of Birth: Jul 02, 1980           MRN: [id number] Visit Date: 03/25/2019              Requested by: Azzie Glatter, [place],  [place] [address] PCP: Azzie Glatter, FNP  Chief Complaint  Patient presents with  . Left Foot - Routine Post Op    02/08/19 left foot 1st ray amputation       HPI: This is a pleasant gentleman who is now 6 weeks status post left foot first ray amputation overall he is doing well.  His biggest concern is of ongoing swelling.  He has completed some doxycycline for some drainage he had a couple weeks ago.  He is full weightbearing in a regular shoe  Assessment & Plan: Visit Diagnoses: No diagnosis found.  Plan: He will follow up in 2 weeks.  He does not need a refill of antibiotics at this time.  I did have him with his sock on and when he did he put the heel too high causing a wrinkle in the back.  I showed him how to put this on properly and to get the wrinkles out especially around his ankle  Follow-Up Instructions: No follow-ups on file.   Ortho Exam  Patient is alert, oriented, no adenopathy, well-dressed, normal affect, normal respiratory effort. Focused examination of his left foot he does have mild to moderate soft tissue swelling but no cellulitis no fluctuance has a thickened callus over it which I did debride after obtaining verbal consent and healthy skin he has a small open blister on the back of the Achilles and the results were not on the front of his ankle.  No surrounding cellulitis he had a palpable dorsalis pedis pulse  Imaging: No results found. No images are attached to the encounter.  Labs: Lab Results  Component Value Date   HGBA1C 9.0 (A) 01/14/2019   HGBA1C 10.6 (A) 08/27/2018   HGBA1C 11.0 (A) 02/16/2018   ESRSEDRATE 5 01/30/2018   ESRSEDRATE 23 (H) 12/26/2016   ESRSEDRATE 1 12/05/2016   CRP <0.8 01/30/2018   CRP 5.8 (H) 12/26/2016   CRP  <0.8 12/05/2016   REPTSTATUS 02/04/2018 FINAL 01/30/2018   GRAMSTAIN  12/26/2016    FEW WBC PRESENT, PREDOMINANTLY PMN ABUNDANT GRAM POSITIVE COCCI IN PAIRS ABUNDANT GRAM NEGATIVE RODS Performed at [facility], [place] [address], [place], [place] [address]    CULT  01/30/2018    NO GROWTH 5 DAYS Performed at [facility] [address] [place], [place] [address]      Lab Results  Component Value Date   ALBUMIN 3.9 02/16/2018   ALBUMIN 4.0 07/14/2017   ALBUMIN 3.6 12/25/2016    No results found for: MG No results found for: VD25OH  No results found for: PREALBUMIN CBC EXTENDED Latest Ref Rng & Units 03/10/2018 02/16/2018 02/01/2018  WBC 4.0 - 10.5 K/uL 6.5 6.0 4.5  RBC 4.22 - 5.81 MIL/uL 5.12 5.60 5.70  HGB 13.0 - 17.0 g/dL 12.7(L) 13.8 13.9  HCT 39.0 - 52.0 % 42.1 43.1 45.7  PLT 150 - 400 K/uL 160 215 166  NEUTROABS 1.7 - 7.7 K/uL 3.9 3.6 1.4(L)  LYMPHSABS 0.7 - 4.0 K/uL 1.9 1.9 2.7     Body mass index is 31.2 kg/m.  Orders:  No orders of the defined  types were placed in this encounter.  No orders of the defined types were placed in this encounter.    Procedures: No procedures performed  Clinical Data: No additional findings.  ROS:  All other systems negative, except as noted in the HPI. Review of Systems  Objective: Vital Signs: Ht 6\' 8"  (2.032 m)   Wt 284 lb (128.8 kg)   BMI 31.20 kg/m   Specialty Comments:  No specialty comments available.  PMFS History: Patient Active Problem List   Diagnosis Date Noted  . Non-pressure chronic ulcer of other part of left foot with necrosis of bone (HCC)   . Cutaneous abscess of left foot   . Hemoglobin A1C greater than 9%, indicating poor diabetic control 08/27/2018  . Amputation of left great toe (HCC) 08/27/2018  . Osteomyelitis of great toe of left foot (HCC) 01/30/2018  . Osteomyelitis (HCC) 01/30/2018  . Depression with anxiety 01/30/2018  . Major depressive disorder, recurrent severe  without psychotic features (HCC) 07/16/2017  . OD (overdose of drug), intentional self-harm, initial encounter (HCC) 07/15/2017  . Cellulitis 12/26/2016  . Open toe wound, subsequent encounter 12/12/2016  . Hyperglycemia 12/06/2016  . Diabetic ulcer of left foot (HCC) 12/05/2016  . Diabetic neuropathy (HCC) 10/16/2015  . Erectile dysfunction 08/05/2015  . Low testosterone 09/12/2014  . DM type 2, uncontrolled, with neuropathy (HCC) 06/12/2014  . Diabetes mellitus, type II (HCC) 03/23/2012  . Noncompliance 03/23/2012  . Gastroenteritis and colitis, viral 03/23/2012   Past Medical History:  Diagnosis Date  . Diabetes mellitus     Family History  Problem Relation Age of Onset  . Other Mother        Healthy  . Other Father        Healthy    Past Surgical History:  Procedure Laterality Date  . AMPUTATION Left 02/02/2018   Procedure: LEFT GREAT TOE AMPUTATION;  Surgeon: 14/07/2017, MD;  Location: [facility] OR;  Service: Orthopedics;  Laterality: Left;  . AMPUTATION Left 02/08/2019   Procedure: LEFT FOOT 1ST RAY AMPUTATION;  Surgeon: 14/12/2018, MD;  Location: [facility] OR;  Service: Orthopedics;  Laterality: Left;   Social History   Occupational History  . Not on file  Tobacco Use  . Smoking status: Never Smoker  . Smokeless tobacco: Never Used  Substance and Sexual Activity  . Alcohol use: Yes    Alcohol/week: 2.0 standard drinks    Types: 1 Glasses of wine, 1 Cans of beer per week    Comment: occassional  . Drug use: No  . Sexual activity: Yes

## 2019-04-01 DIAGNOSIS — R6882 Decreased libido: Secondary | ICD-10-CM

## 2019-04-01 DIAGNOSIS — N529 Male erectile dysfunction, unspecified: Secondary | ICD-10-CM

## 2019-04-01 HISTORY — DX: Male erectile dysfunction, unspecified: N52.9

## 2019-04-01 HISTORY — DX: Decreased libido: R68.82

## 2019-04-08 ENCOUNTER — Other Ambulatory Visit: Payer: Self-pay

## 2019-04-08 ENCOUNTER — Encounter: Payer: Self-pay | Admitting: Physician Assistant

## 2019-04-08 ENCOUNTER — Ambulatory Visit (INDEPENDENT_AMBULATORY_CARE_PROVIDER_SITE_OTHER): Payer: Self-pay | Admitting: Orthopedic Surgery

## 2019-04-08 VITALS — Ht >= 80 in | Wt 284.0 lb

## 2019-04-08 DIAGNOSIS — Z89412 Acquired absence of left great toe: Secondary | ICD-10-CM

## 2019-04-08 NOTE — Progress Notes (Signed)
Office Visit Note   Patient: Jonathan [name]           Date of Birth: Feb 05, 1981           MRN: [id number] Visit Date: 04/08/2019              Requested by: Kallie Locks, FNP [address] [place],  [place] [address] PCP: Kallie Locks, FNP  Chief Complaint  Patient presents with  . Left Foot - Routine Post Op    02/08/19 left foot 1st ray amputation       HPI: Patient is a 39 year old gentleman who presents 2 months status post left foot first ray amputation is currently wearing knee-high medical compression stockings.  Patient states he does not elevate his leg consistently.  He states his foot is usually flat on the floor.  Assessment & Plan: Visit Diagnoses:  1. Status post amputation of left great toe (HCC)     Plan: Recommended elevation compression and exercise to work the calf muscle all 3 to decrease swelling.  Follow-Up Instructions: Return in about 4 weeks (around 05/06/2019).   Ortho Exam  Patient is alert, oriented, no adenopathy, well-dressed, normal affect, normal respiratory effort. Examination patient does have swelling of the foot but there is no wound dehiscence no cellulitis no drainage no signs of infection.  Patient does have  pitting edema up to the tibial tubercle.  Imaging: No results found. No images are attached to the encounter.  Labs: Lab Results  Component Value Date   HGBA1C 9.0 (A) 01/14/2019   HGBA1C 10.6 (A) 08/27/2018   HGBA1C 11.0 (A) 02/16/2018   ESRSEDRATE 5 01/30/2018   ESRSEDRATE 23 (H) 12/26/2016   ESRSEDRATE 1 12/05/2016   CRP <0.8 01/30/2018   CRP 5.8 (H) 12/26/2016   CRP <0.8 12/05/2016   REPTSTATUS 02/04/2018 FINAL 01/30/2018   GRAMSTAIN  12/26/2016    FEW WBC PRESENT, PREDOMINANTLY PMN ABUNDANT GRAM POSITIVE COCCI IN PAIRS ABUNDANT GRAM NEGATIVE RODS Performed at [facility], [address], [place], [place] [address]    CULT  01/30/2018    NO GROWTH 5 DAYS Performed at [facility],  [address] [place], [place] [address]      Lab Results  Component Value Date   ALBUMIN 3.9 02/16/2018   ALBUMIN 4.0 07/14/2017   ALBUMIN 3.6 12/25/2016    No results found for: MG No results found for: VD25OH  No results found for: PREALBUMIN CBC EXTENDED Latest Ref Rng & Units 03/10/2018 02/16/2018 02/01/2018  WBC 4.0 - 10.5 K/uL 6.5 6.0 4.5  RBC 4.22 - 5.81 MIL/uL 5.12 5.60 5.70  HGB 13.0 - 17.0 g/dL 12.7(L) 13.8 13.9  HCT 39.0 - 52.0 % 42.1 43.1 45.7  PLT 150 - 400 K/uL 160 215 166  NEUTROABS 1.7 - 7.7 K/uL 3.9 3.6 1.4(L)  LYMPHSABS 0.7 - 4.0 K/uL 1.9 1.9 2.7     Body mass index is 31.2 kg/m.  Orders:  No orders of the defined types were placed in this encounter.  No orders of the defined types were placed in this encounter.    Procedures: No procedures performed  Clinical Data: No additional findings.  ROS:  All other systems negative, except as noted in the HPI. Review of Systems  Objective: Vital Signs: Ht 6\' 8"  (2.032 m)   Wt 284 lb (128.8 kg)   BMI 31.20 kg/m   Specialty Comments:  No specialty comments available.  PMFS History: Patient Active Problem  List   Diagnosis Date Noted  . Non-pressure chronic ulcer of other part of left foot with necrosis of bone ([place])   . Cutaneous abscess of left foot   . Hemoglobin A1C greater than 9%, indicating poor diabetic control 08/27/2018  . Amputation of left great toe ([place]) 08/27/2018  . Osteomyelitis of great toe of left foot ([place]) 01/30/2018  . Osteomyelitis ([place]) 01/30/2018  . Depression with anxiety 01/30/2018  . Major depressive disorder, recurrent severe without psychotic features ([place]) 07/16/2017  . OD (overdose of drug), intentional self-harm, initial encounter ([place]) 07/15/2017  . Cellulitis 12/26/2016  . Open toe wound, subsequent encounter 12/12/2016  . Hyperglycemia 12/06/2016  . Diabetic ulcer of left foot ([place]) 12/05/2016  . Diabetic neuropathy ([place]) 10/16/2015  . Erectile dysfunction  08/05/2015  . Low testosterone 09/12/2014  . DM type 2, uncontrolled, with neuropathy ([place]) 06/12/2014  . Diabetes mellitus, type II ([place]) 03/23/2012  . Noncompliance 03/23/2012  . Gastroenteritis and colitis, viral 03/23/2012   Past Medical History:  Diagnosis Date  . Diabetes mellitus     Family History  Problem Relation Age of Onset  . Other Mother        Healthy  . Other Father        Healthy    Past Surgical History:  Procedure Laterality Date  . AMPUTATION Left 02/02/2018   Procedure: LEFT GREAT TOE AMPUTATION;  Surgeon: Newt Minion, MD;  Location: [facility];  Service: Orthopedics;  Laterality: Left;  . AMPUTATION Left 02/08/2019   Procedure: LEFT FOOT 1ST RAY AMPUTATION;  Surgeon: Newt Minion, MD;  Location: [facility];  Service: Orthopedics;  Laterality: Left;   Social History   Occupational History  . Not on file  Tobacco Use  . Smoking status: Never Smoker  . Smokeless tobacco: Never Used  Substance and Sexual Activity  . Alcohol use: Yes    Alcohol/week: 2.0 standard drinks    Types: 1 Glasses of wine, 1 Cans of beer per week    Comment: occassional  . Drug use: No  . Sexual activity: Yes

## 2019-04-10 ENCOUNTER — Other Ambulatory Visit: Payer: Self-pay | Admitting: Family Medicine

## 2019-04-10 DIAGNOSIS — IMO0002 Reserved for concepts with insufficient information to code with codable children: Secondary | ICD-10-CM

## 2019-04-10 DIAGNOSIS — R7989 Other specified abnormal findings of blood chemistry: Secondary | ICD-10-CM

## 2019-04-10 DIAGNOSIS — N529 Male erectile dysfunction, unspecified: Secondary | ICD-10-CM

## 2019-04-10 DIAGNOSIS — E1140 Type 2 diabetes mellitus with diabetic neuropathy, unspecified: Secondary | ICD-10-CM

## 2019-04-10 MED FILL — ATORVASTATIN 10 MG TABLET: 10 | 30 days supply | Qty: 30 | Fill #2

## 2019-04-10 MED FILL — HUMALOG MIX 75-25 KWIKPEN: (75-25) 100 | 30 days supply | Qty: 21 | Fill #2

## 2019-04-10 MED FILL — GABAPENTIN 300 MG CAPSULE: 300 | 30 days supply | Qty: 60 | Fill #6

## 2019-04-11 MED FILL — SILDENAFIL CITRATE 100 MG T: 100 | 15 days supply | Qty: 4 | Fill #0

## 2019-04-11 MED FILL — metFORMIN HCL 1000 MG TABS: 1000 | 30 days supply | Qty: 60 | Fill #0

## 2019-04-15 ENCOUNTER — Ambulatory Visit: Payer: Self-pay | Admitting: Family Medicine

## 2019-04-26 ENCOUNTER — Telehealth: Payer: Self-pay | Admitting: Family Medicine

## 2019-04-26 NOTE — Telephone Encounter (Signed)
Pt was called and reminded of there appointment 

## 2019-04-29 ENCOUNTER — Ambulatory Visit (INDEPENDENT_AMBULATORY_CARE_PROVIDER_SITE_OTHER): Payer: 59 | Admitting: Family Medicine

## 2019-04-29 ENCOUNTER — Other Ambulatory Visit: Payer: Self-pay

## 2019-04-29 ENCOUNTER — Encounter: Payer: Self-pay | Admitting: Family Medicine

## 2019-04-29 VITALS — BP 136/92 | HR 84 | Temp 98.5°F | Resp 14 | Ht >= 80 in | Wt 290.0 lb

## 2019-04-29 DIAGNOSIS — Z794 Long term (current) use of insulin: Secondary | ICD-10-CM | POA: Diagnosis not present

## 2019-04-29 DIAGNOSIS — Z09 Encounter for follow-up examination after completed treatment for conditions other than malignant neoplasm: Secondary | ICD-10-CM

## 2019-04-29 DIAGNOSIS — R7989 Other specified abnormal findings of blood chemistry: Secondary | ICD-10-CM | POA: Insufficient documentation

## 2019-04-29 DIAGNOSIS — E118 Type 2 diabetes mellitus with unspecified complications: Secondary | ICD-10-CM | POA: Diagnosis not present

## 2019-04-29 DIAGNOSIS — Z89412 Acquired absence of left great toe: Secondary | ICD-10-CM | POA: Diagnosis not present

## 2019-04-29 DIAGNOSIS — R7303 Prediabetes: Secondary | ICD-10-CM | POA: Diagnosis not present

## 2019-04-29 DIAGNOSIS — Z23 Encounter for immunization: Secondary | ICD-10-CM | POA: Diagnosis not present

## 2019-04-29 DIAGNOSIS — R6882 Decreased libido: Secondary | ICD-10-CM | POA: Insufficient documentation

## 2019-04-29 DIAGNOSIS — E559 Vitamin D deficiency, unspecified: Secondary | ICD-10-CM

## 2019-04-29 DIAGNOSIS — N529 Male erectile dysfunction, unspecified: Secondary | ICD-10-CM

## 2019-04-29 DIAGNOSIS — E119 Type 2 diabetes mellitus without complications: Secondary | ICD-10-CM | POA: Insufficient documentation

## 2019-04-29 HISTORY — DX: Other specified abnormal findings of blood chemistry: R79.89

## 2019-04-29 HISTORY — DX: Vitamin D deficiency, unspecified: E55.9

## 2019-04-29 LAB — POCT GLYCOSYLATED HEMOGLOBIN (HGB A1C): Hemoglobin A1C: 8.8 % — AB (ref 4.0–5.6)

## 2019-04-29 LAB — POCT URINALYSIS DIPSTICK
Bilirubin, UA: NEGATIVE
Glucose, UA: NEGATIVE
Leukocytes, UA: NEGATIVE
Nitrite, UA: NEGATIVE
Protein, UA: NEGATIVE
Spec Grav, UA: 1.02 (ref 1.010–1.025)
Urobilinogen, UA: 0.2 E.U./dL
pH, UA: 7 (ref 5.0–8.0)

## 2019-04-29 LAB — GLUCOSE, POCT (MANUAL RESULT ENTRY): POC Glucose: 139 mg/dl — AB (ref 70–99)

## 2019-04-29 NOTE — Progress Notes (Signed)
Patient [name] Follow Up  Subjective:  Patient ID: Jonathan [name], male    DOB: 1980/08/10  Age: 39 y.o. MRN: [id number]  CC:  Chief Complaint  Patient presents with  . Diabetes    HPI Jonathan [name] is a 39 year old who presents for Hospital Follow Up today.   Past Medical History:  Diagnosis Date  . Amputation of left great toe ([place]) 01/2019  . Diabetes mellitus    Current Status: Since his last office visit, he has had a ED visit for Left Foot Necrosis/Left Great Toe Amputation on 02/08/2019. Today, he is doing well with no complaints. He states that he is currently wearing compression socks for comfort. He has seen low range of 120 and high of 158 since his last office visit. He denies fatigue, frequent urination, blurred vision, excessive hunger, excessive thirst, weight gain, weight loss, and poor wound healing. He continues to check his feet regularly. He continues to have decreased libido and states that Sildenafil is not effective. He denies fevers, chills, fatigue, recent infections, weight loss, and night sweats. He has not had any headaches, visual changes, dizziness, and falls. No chest pain, heart palpitations, cough and shortness of breath reported. No reports of GI problems such as nausea, vomiting, diarrhea, and constipation. He has no reports of blood in stools, dysuria and hematuria. No depression or anxiety reported today. He denies suicidal ideations, homicidal ideations, or auditory hallucinations. He denies pain today.   Past Surgical History:  Procedure Laterality Date  . AMPUTATION Left 02/02/2018   Procedure: LEFT GREAT TOE AMPUTATION;  Surgeon: Newt Minion, MD;  Location: [place];  Service: Orthopedics;  Laterality: Left;  . AMPUTATION Left 02/08/2019   Procedure: LEFT FOOT 1ST RAY AMPUTATION;  Surgeon: Newt Minion, MD;  Location: [place];  Service: Orthopedics;  Laterality: Left;    Family History   Problem Relation Age of Onset  . Other Mother        Healthy  . Other Father        Healthy    Social History   Socioeconomic History  . Marital status: Single    Spouse name: Not on file  . Number of children: Not on file  . Years of education: Not on file  . Highest education level: Not on file  Occupational History  . Not on file  Tobacco Use  . Smoking status: Never Smoker  . Smokeless tobacco: Never Used  Substance and Sexual Activity  . Alcohol use: Yes    Alcohol/week: 2.0 standard drinks    Types: 1 Glasses of wine, 1 Cans of beer per week    Comment: occassional  . Drug use: No  . Sexual activity: Yes  Other Topics Concern  . Not on file  Social History Narrative  . Not on file   Social Determinants of Health   Financial Resource Strain:   . Difficulty of Paying Living Expenses: Not on file  Food Insecurity:   . Worried About Charity fundraiser in the Last Year: Not on file  . Ran Out of Food in the Last Year: Not on file  Transportation Needs:   . Lack of Transportation (Medical): Not on file  . Lack of Transportation (Non-Medical): Not on file  Physical Activity:   . Days of Exercise per Week: Not on file  . Minutes of Exercise per Session: Not on file  Stress:   . Feeling  of Stress : Not on file  Social Connections:   . Frequency of Communication with Friends and Family: Not on file  . Frequency of Social Gatherings with Friends and Family: Not on file  . Attends Religious Services: Not on file  . Active Member of Clubs or Organizations: Not on file  . Attends Archivist Meetings: Not on file  . Marital Status: Not on file  Intimate Partner Violence:   . Fear of Current or Ex-Partner: Not on file  . Emotionally Abused: Not on file  . Physically Abused: Not on file  . Sexually Abused: Not on file    Outpatient Medications Prior to Visit  Medication Sig Dispense Refill  . atorvastatin (LIPITOR) 10 MG tablet Take 1 tablet (10 mg  total) by mouth daily. 30 tablet 6  . blood glucose meter kit and supplies Dispense based on patient and insurance preference. Use up to four times daily as directed. (FOR ICD-10 E10.9, E11.9). 1 each 0  . gabapentin (NEURONTIN) 300 MG capsule Take 1 capsule (300 mg total) by mouth at bedtime. 60 capsule 11  . glucose blood test strip Use test strips to check blood sugar four times daily as directed 100 each 3  . HUMALOG MIX 75/25 KWIKPEN (75-25) 100 UNIT/ML Kwikpen INJECT 35 UNITS INTO THE SKIN 2 (TWO) TIMES DAILY. 21 mL 3  . ibuprofen (ADVIL,MOTRIN) 800 MG tablet Take 1 tablet (800 mg total) by mouth every 8 (eight) hours as needed. (Patient taking differently: Take 800 mg by mouth every 8 (eight) hours as needed for moderate pain. ) 30 tablet 2  . metFORMIN (GLUCOPHAGE) 1000 MG tablet TAKE 1 TABLET (1,000 MG TOTAL) BY MOUTH 2 (TWO) TIMES DAILY. 60 tablet 0  . sildenafil (VIAGRA) 100 MG tablet TAKE 1 TABLET (100 MG TOTAL) BY MOUTH DAILY AS NEEDED FOR ERECTILE DYSFUNCTION. 4 tablet 0  . doxycycline (VIBRA-TABS) 100 MG tablet Take 1 tablet (100 mg total) by mouth 2 (two) times daily. 28 tablet 0  . oxyCODONE-acetaminophen (PERCOCET) 5-325 MG tablet Take 1 tablet by mouth every 4 (four) hours as needed for severe pain. 30 tablet 0   No facility-administered medications prior to visit.    No Known Allergies  ROS Review of Systems  Constitutional: Negative.   HENT: Negative.   Eyes: Negative.   Respiratory: Negative.   Cardiovascular: Negative.   Gastrointestinal: Negative.   Endocrine: Negative.   Genitourinary: Negative.        Erectile dysfunction  Musculoskeletal: Negative.   Skin: Negative.   Allergic/Immunologic: Negative.   Neurological: Negative.   Hematological: Negative.   Psychiatric/Behavioral: Negative.       Objective:    Physical Exam  Constitutional: He is oriented to person, place, and time. He appears well-developed and well-nourished.  HENT:  Head:  Normocephalic and atraumatic.  Eyes: Conjunctivae are normal.  Cardiovascular: Normal rate, regular rhythm, normal heart sounds and intact distal pulses.  Pulmonary/Chest: Effort normal and breath sounds normal.  Abdominal: Soft. Bowel sounds are normal.  Musculoskeletal:     Cervical back: Normal range of motion and neck supple.     Comments: Left Great Toe Amputation.   Neurological: He is alert and oriented to person, place, and time.  Skin: Skin is warm and dry.  Psychiatric: He has a normal mood and affect. His behavior is normal. Judgment and thought content normal.  Nursing note and vitals reviewed.   BP (!) 136/92 (BP Location: Left Arm, Patient Position: Sitting, Cuff Size:  Large)   Pulse 84   Temp 98.5 F (36.9 C) (Oral)   Resp 14   Ht _0  (2.032 m)   Wt 290 lb (131.5 kg)   SpO2 100%   BMI 31.86 kg/m  Wt Readings from Last 3 Encounters:  04/29/19 290 lb (131.5 kg)  04/08/19 284 lb (128.8 kg)  03/25/19 284 lb (128.8 kg)     Health Maintenance Due  Topic Date Due  . PNEUMOCOCCAL POLYSACCHARIDE VACCINE AGE 52-64 HIGH RISK  01/20/1984  . OPHTHALMOLOGY EXAM  01/20/1992  . FOOT EXAM  10/15/2016  . URINE MICROALBUMIN  02/17/2019    There are no preventive care reminders to display for this patient.  Lab Results  Component Value Date   TSH 1.000 02/16/2018   Lab Results  Component Value Date   WBC 6.5 03/10/2018   HGB 12.7 (L) 03/10/2018   HCT 42.1 03/10/2018   MCV 82.2 03/10/2018   PLT 160 03/10/2018   Lab Results  Component Value Date   NA 141 03/10/2018   K 3.7 03/10/2018   CO2 24 03/10/2018   GLUCOSE 128 (H) 03/10/2018   BUN 8 03/10/2018   CREATININE 0.74 03/10/2018   BILITOT 0.3 02/16/2018   ALKPHOS 77 02/16/2018   AST 8 02/16/2018   ALT 16 02/16/2018   PROT 6.9 02/16/2018   ALBUMIN 3.9 02/16/2018   CALCIUM 9.0 03/10/2018   ANIONGAP 11 03/10/2018   Lab Results  Component Value Date   CHOL 79 (L) 02/16/2018   Lab Results  Component  Value Date   HDL 37 (L) 02/16/2018   Lab Results  Component Value Date   LDLCALC 27 02/16/2018   Lab Results  Component Value Date   TRIG 77 02/16/2018   Lab Results  Component Value Date   CHOLHDL 2.1 02/16/2018   Lab Results  Component Value Date   HGBA1C 8.8 (A) 04/29/2019     Assessment & Plan:   1. Type 2 diabetes mellitus with complication, with long-term current use of insulin ([place]) He will continue medication as prescribed, to decrease foods/beverages high in sugars and carbs and follow Heart Healthy or DASH diet. Increase physical activity to at least 30 minutes cardio exercise daily.  - HgB A1c - Glucose (CBG) - Urinalysis Dipstick - Microalbumin, urine - CBC with Differential - Comprehensive metabolic panel - Lipid Panel - TSH - Vitamin B12 - Vitamin D, 25-hydroxy  2. Hemoglobin A1C between 7% and 9% indicating borderline diabetic control Hgb A1c is 8.8 today, decreased from 10.6 on 08/27/2018. Monitor.   3. Erectile dysfunction, unspecified erectile dysfunction type  4. Status post amputation of left great toe (HCC) Stable.   5. Decreased libido - Testosterone  6. Follow up He will follow up in 3 months.   No orders of the defined types were placed in this encounter.  Orders Placed This Encounter  Procedures  . Flu Vaccine QUAD 6+ mos PF IM (Fluarix Quad PF)  . Microalbumin, urine  . CBC with Differential  . Comprehensive metabolic panel  . Lipid Panel  . TSH  . Vitamin B12  . Vitamin D, 25-hydroxy  . Testosterone  . HgB A1c  . Glucose (CBG)  . Urinalysis Dipstick    Referral Orders  No referral(s) requested today    Kathe Becton,  MSN, FNP-BC [place] [address] [place], [place] [address] [phone] fax    Problem List Items Addressed This  Visit      Other   Amputation of left great toe [facility])   Erectile dysfunction    Other  Visit Diagnoses    Type 2 diabetes mellitus with complication, with long-term current use of insulin ([place])    -  Primary   Relevant Orders   HgB A1c (Completed)   Glucose (CBG) (Completed)   Urinalysis Dipstick (Completed)   Microalbumin, urine   CBC with Differential   Comprehensive metabolic panel   Lipid Panel   TSH   Vitamin B12   Vitamin D, 25-hydroxy   Hemoglobin A1C between 7% and 9% indicating borderline diabetic control       Decreased libido       Relevant Orders   Testosterone   Follow up          No orders of the defined types were placed in this encounter.   Follow-up: Return in about 3 months (around 07/30/2019).    Azzie Glatter, FNP

## 2019-04-30 ENCOUNTER — Other Ambulatory Visit: Payer: Self-pay | Admitting: Family Medicine

## 2019-04-30 ENCOUNTER — Encounter: Payer: Self-pay | Admitting: Family Medicine

## 2019-04-30 DIAGNOSIS — E559 Vitamin D deficiency, unspecified: Secondary | ICD-10-CM

## 2019-04-30 DIAGNOSIS — N529 Male erectile dysfunction, unspecified: Secondary | ICD-10-CM

## 2019-04-30 DIAGNOSIS — R7989 Other specified abnormal findings of blood chemistry: Secondary | ICD-10-CM

## 2019-04-30 LAB — COMPREHENSIVE METABOLIC PANEL
ALT: 38 IU/L (ref 0–44)
AST: 29 IU/L (ref 0–40)
Albumin/Globulin Ratio: 1.2 (ref 1.2–2.2)
Albumin: 4.1 g/dL (ref 4.0–5.0)
Alkaline Phosphatase: 103 IU/L (ref 39–117)
BUN/Creatinine Ratio: 7 — ABNORMAL LOW (ref 9–20)
BUN: 6 mg/dL (ref 6–20)
Bilirubin Total: <0.2 mg/dL (ref 0.0–1.2)
CO2: 25 mmol/L (ref 20–29)
Calcium: 9.2 mg/dL (ref 8.7–10.2)
Chloride: 105 mmol/L (ref 96–106)
Creatinine, Ser: 0.81 mg/dL (ref 0.76–1.27)
GFR calc Af Amer: 131 mL/min/{1.73_m2} (ref >59)
GFR calc non Af Amer: 114 mL/min/{1.73_m2} (ref >59)
Globulin, Total: 3.3 g/dL (ref 1.5–4.5)
Glucose: 132 mg/dL — ABNORMAL HIGH (ref 65–99)
Potassium: 4.3 mmol/L (ref 3.5–5.2)
Sodium: 141 mmol/L (ref 134–144)
Total Protein: 7.4 g/dL (ref 6.0–8.5)

## 2019-04-30 LAB — CBC WITH DIFFERENTIAL/PLATELET
Basophils Absolute: 0 10*3/uL (ref 0.0–0.2)
Basos: 1 %
EOS (ABSOLUTE): 0.1 10*3/uL (ref 0.0–0.4)
Eos: 2 %
Hematocrit: 43.1 % (ref 37.5–51.0)
Hemoglobin: 13.5 g/dL (ref 13.0–17.7)
Immature Grans (Abs): 0 10*3/uL (ref 0.0–0.1)
Immature Granulocytes: 1 %
Lymphocytes Absolute: 2.3 10*3/uL (ref 0.7–3.1)
Lymphs: 51 %
MCH: 24.5 pg — ABNORMAL LOW (ref 26.6–33.0)
MCHC: 31.3 g/dL — ABNORMAL LOW (ref 31.5–35.7)
MCV: 78 fL — ABNORMAL LOW (ref 79–97)
Monocytes Absolute: 0.3 10*3/uL (ref 0.1–0.9)
Monocytes: 7 %
Neutrophils Absolute: 1.6 10*3/uL (ref 1.4–7.0)
Neutrophils: 38 %
Platelets: 176 10*3/uL (ref 150–450)
RBC: 5.5 x10E6/uL (ref 4.14–5.80)
RDW: 14.3 % (ref 11.6–15.4)
WBC: 4.3 10*3/uL (ref 3.4–10.8)

## 2019-04-30 LAB — MICROALBUMIN, URINE: Microalbumin, Urine: 20.7 ug/mL

## 2019-04-30 LAB — LIPID PANEL
Chol/HDL Ratio: 2.4 ratio (ref 0.0–5.0)
Cholesterol, Total: 107 mg/dL (ref 100–199)
HDL: 45 mg/dL (ref >39)
LDL Chol Calc (NIH): 40 mg/dL (ref 0–99)
Triglycerides: 122 mg/dL (ref 0–149)
VLDL Cholesterol Cal: 22 mg/dL (ref 5–40)

## 2019-04-30 LAB — VITAMIN B12: Vitamin B-12: 576 pg/mL (ref 232–1245)

## 2019-04-30 LAB — VITAMIN D 25 HYDROXY (VIT D DEFICIENCY, FRACTURES): Vit D, 25-Hydroxy: 8.8 ng/mL — ABNORMAL LOW (ref 30.0–100.0)

## 2019-04-30 LAB — TESTOSTERONE: Testosterone: 155 ng/dL — ABNORMAL LOW (ref 264–916)

## 2019-04-30 LAB — TSH: TSH: 1.59 u[IU]/mL (ref 0.450–4.500)

## 2019-04-30 MED ORDER — VITAMIN D (ERGOCALCIFEROL) 1.25 MG (50000 UNIT) PO CAPS: 50000.0000 [IU] | ORAL_CAPSULE | ORAL | 6 refills | Status: DC

## 2019-05-01 ENCOUNTER — Telehealth: Payer: Self-pay

## 2019-05-01 MED FILL — VIT D2 1.25 MG (50,000 UNIT: 1.25 MG | 28 days supply | Qty: 4 | Fill #0

## 2019-05-01 NOTE — Telephone Encounter (Signed)
Message left for call back for test results.

## 2019-05-01 NOTE — Telephone Encounter (Signed)
-----   Message from Kallie Locks, FNP sent at 04/30/2019  9:10 PM EST ----- Vitamin D level is extremely low. Rx for Vitamin D supplement sent to pharmacy today. He should include foods that are high in Vitamin D. These include: Salmon, Cod Liver Oil, Mushrooms, Canned Fish, Milk, and Egg Yolks.  Testosterone level is decreased. Referral sent to Endocrinology today.   All other labs are stable. Keep follow up appointment. Please inform patient.

## 2019-05-10 ENCOUNTER — Ambulatory Visit: Payer: Self-pay | Admitting: Physician Assistant

## 2019-05-17 ENCOUNTER — Other Ambulatory Visit: Payer: Self-pay

## 2019-05-17 ENCOUNTER — Other Ambulatory Visit (INDEPENDENT_AMBULATORY_CARE_PROVIDER_SITE_OTHER): Payer: Self-pay | Admitting: Physician Assistant

## 2019-05-17 DIAGNOSIS — E1142 Type 2 diabetes mellitus with diabetic polyneuropathy: Secondary | ICD-10-CM

## 2019-05-17 DIAGNOSIS — Z794 Long term (current) use of insulin: Secondary | ICD-10-CM

## 2019-05-17 MED ORDER — ONETOUCH VERIO W/DEVICE KIT: 1.0000 [IU] | PACK | Freq: Four times a day (QID) | 0 refills | Status: DC

## 2019-05-17 MED ORDER — ONETOUCH VERIO VI STRP: ORAL_STRIP | 12 refills | Status: DC

## 2019-05-17 MED ORDER — ONETOUCH ULTRASOFT LANCETS MISC: 12 refills | Status: DC

## 2019-05-17 MED FILL — ONE TOUCH VERIO TEST STRIP: 25 days supply | Qty: 100 | Fill #0

## 2019-05-17 MED FILL — VIT D2 1.25 MG (50,000 UNIT: 1.25 MG | 28 days supply | Qty: 4 | Fill #0

## 2019-05-17 MED FILL — HUMALOG MIX 75-25 KWIKPEN: (75-25) 100 | 30 days supply | Qty: 21 | Fill #3

## 2019-05-17 MED FILL — ONETOUCH VERIO REFLECT W/DE: W/DEVICE | 30 days supply | Qty: 1 | Fill #0

## 2019-05-17 MED FILL — ATORVASTATIN 10 MG TABLET: 10 | 30 days supply | Qty: 30 | Fill #3

## 2019-05-20 ENCOUNTER — Ambulatory Visit (INDEPENDENT_AMBULATORY_CARE_PROVIDER_SITE_OTHER): Payer: 59 | Admitting: Physician Assistant

## 2019-05-20 ENCOUNTER — Other Ambulatory Visit: Payer: Self-pay

## 2019-05-20 ENCOUNTER — Encounter: Payer: Self-pay | Admitting: Physician Assistant

## 2019-05-20 VITALS — Ht >= 80 in | Wt 290.0 lb

## 2019-05-20 DIAGNOSIS — L97521 Non-pressure chronic ulcer of other part of left foot limited to breakdown of skin: Secondary | ICD-10-CM | POA: Diagnosis not present

## 2019-05-20 MED FILL — ONE TOUCH ULTRASOFT LANCETS: 25 days supply | Qty: 100 | Fill #0

## 2019-05-20 MED FILL — SILDENAFIL CITRATE 100 MG T: 100 | 15 days supply | Qty: 4 | Fill #0

## 2019-05-20 MED FILL — metFORMIN HCL 1000 MG TABS: 1000 | 30 days supply | Qty: 60 | Fill #0

## 2019-05-20 NOTE — Progress Notes (Signed)
Office Visit Note   Patient: Jonathan [name]           Date of Birth: 15-Aug-1980           MRN: [id number] Visit Date: 05/20/2019              Requested by: Kallie Locks, FNP [address] [place],  [place] [address] PCP: Kallie Locks, FNP  Chief Complaint  Patient presents with  . Left Foot - Follow-up    02/08/19 left foot 1st ray amputation       HPI: This is a pleasant gentleman who is 3-1/2 months status post left foot first ray amputation he is wearing a compression sock and other than some swelling has no complaints  Assessment & Plan: Visit Diagnoses: No diagnosis found.  Plan: He may follow-up as needed.  He understands he is to examine his feet every day should also try and control his blood sugars  Follow-Up Instructions: No follow-ups on file.   Ortho Exam  Patient is alert, oriented, no adenopathy, well-dressed, normal affect, normal respiratory effort. Focused examination of his left foot demonstrates a healed surgical incision.  No open areas no fluctuance no cellulitis no necrosis  Imaging: No results found. No images are attached to the encounter.  Labs: Lab Results  Component Value Date   HGBA1C 8.8 (A) 04/29/2019   HGBA1C 9.0 (A) 01/14/2019   HGBA1C 10.6 (A) 08/27/2018   ESRSEDRATE 5 01/30/2018   ESRSEDRATE 23 (H) 12/26/2016   ESRSEDRATE 1 12/05/2016   CRP <0.8 01/30/2018   CRP 5.8 (H) 12/26/2016   CRP <0.8 12/05/2016   REPTSTATUS 02/04/2018 FINAL 01/30/2018   GRAMSTAIN  12/26/2016    FEW WBC PRESENT, PREDOMINANTLY PMN ABUNDANT GRAM POSITIVE COCCI IN PAIRS ABUNDANT GRAM NEGATIVE RODS Performed at [facility], [address], [place], [place] [address]    CULT  01/30/2018    NO GROWTH 5 DAYS Performed at [facility], [address], [place], [place] [address]      Lab Results  Component Value Date   ALBUMIN 4.1 04/29/2019   ALBUMIN 3.9 02/16/2018   ALBUMIN 4.0 07/14/2017    No results found for:  MG Lab Results  Component Value Date   VD25OH 8.8 (L) 04/29/2019    No results found for: PREALBUMIN CBC EXTENDED Latest Ref Rng & Units 04/29/2019 03/10/2018 02/16/2018  WBC 3.4 - 10.8 x10E3/uL 4.3 6.5 6.0  RBC 4.14 - 5.80 x10E6/uL 5.50 5.12 5.60  HGB 13.0 - 17.7 g/dL 33.2 12.7(L) 13.8  HCT 37.5 - 51.0 % 43.1 42.1 43.1  PLT 150 - 450 x10E3/uL 176 160 215  NEUTROABS 1.4 - 7.0 x10E3/uL 1.6 3.9 3.6  LYMPHSABS 0.7 - 3.1 x10E3/uL 2.3 1.9 1.9     Body mass index is 31.86 kg/m.  Orders:  No orders of the defined types were placed in this encounter.  No orders of the defined types were placed in this encounter.    Procedures: No procedures performed  Clinical Data: No additional findings.  ROS:  All other systems negative, except as noted in the HPI. Review of Systems  Objective: Vital Signs: Ht 6\' 8"  (2.032 m)   Wt 290 lb (131.5 kg)   BMI 31.86 kg/m   Specialty Comments:  No specialty comments available.  PMFS History: Patient Active Problem List   Diagnosis Date Noted  . Hemoglobin A1C between 7% and 9% indicating borderline diabetic control 04/29/2019  . Decreased libido 04/29/2019  .  Low testosterone in male 04/2019  . Non-pressure chronic ulcer of other part of left foot with necrosis of bone ([place])   . Cutaneous abscess of left foot   . Hemoglobin A1C greater than 9%, indicating poor diabetic control 08/27/2018  . Amputation of left great toe ([place]) 08/27/2018  . Osteomyelitis of great toe of left foot ([place]) 01/30/2018  . Osteomyelitis ([place]) 01/30/2018  . Depression with anxiety 01/30/2018  . Major depressive disorder, recurrent severe without psychotic features ([place]) 07/16/2017  . OD (overdose of drug), intentional self-harm, initial encounter ([place]) 07/15/2017  . Cellulitis 12/26/2016  . Open toe wound, subsequent encounter 12/12/2016  . Hyperglycemia 12/06/2016  . Diabetic ulcer of left foot ([place]) 12/05/2016  . Diabetic neuropathy ([place]) 10/16/2015  .  Erectile dysfunction 08/05/2015  . Low testosterone 09/12/2014  . DM type 2, uncontrolled, with neuropathy ([place]) 06/12/2014  . Diabetes mellitus, type II ([place]) 03/23/2012  . Noncompliance 03/23/2012  . Gastroenteritis and colitis, viral 03/23/2012   Past Medical History:  Diagnosis Date  . Amputation of left great toe ([place]) 01/2019  . Decreased libido 04/2019  . Diabetes mellitus   . Erectile dysfunction 04/2019  . Low testosterone in male 04/2019  . Vitamin D deficiency 04/2019    Family History  Problem Relation Age of Onset  . Other Mother        Healthy  . Other Father        Healthy    Past Surgical History:  Procedure Laterality Date  . AMPUTATION Left 02/02/2018   Procedure: LEFT GREAT TOE AMPUTATION;  Surgeon: Newt Minion, MD;  Location: [facility];  Service: Orthopedics;  Laterality: Left;  . AMPUTATION Left 02/08/2019   Procedure: LEFT FOOT 1ST RAY AMPUTATION;  Surgeon: Newt Minion, MD;  Location: [place] [facility];  Service: Orthopedics;  Laterality: Left;   Social History   Occupational History  . Not on file  Tobacco Use  . Smoking status: Never Smoker  . Smokeless tobacco: Never Used  Substance and Sexual Activity  . Alcohol use: Yes    Alcohol/week: 2.0 standard drinks    Types: 1 Glasses of wine, 1 Cans of beer per week    Comment: occassional  . Drug use: No  . Sexual activity: Yes

## 2019-05-23 IMAGING — CR DG FOOT COMPLETE 3+V*L*
3 series · 3 of 3 positions shown · non-contrast
Comparison: Left foot radiograph 12/05/2016

CLINICAL DATA: Nonhealing wound in.  Concern for osteomyelitis.

EXAM:
LEFT FOOT - COMPLETE 3+ VIEW

[x foot ap left]
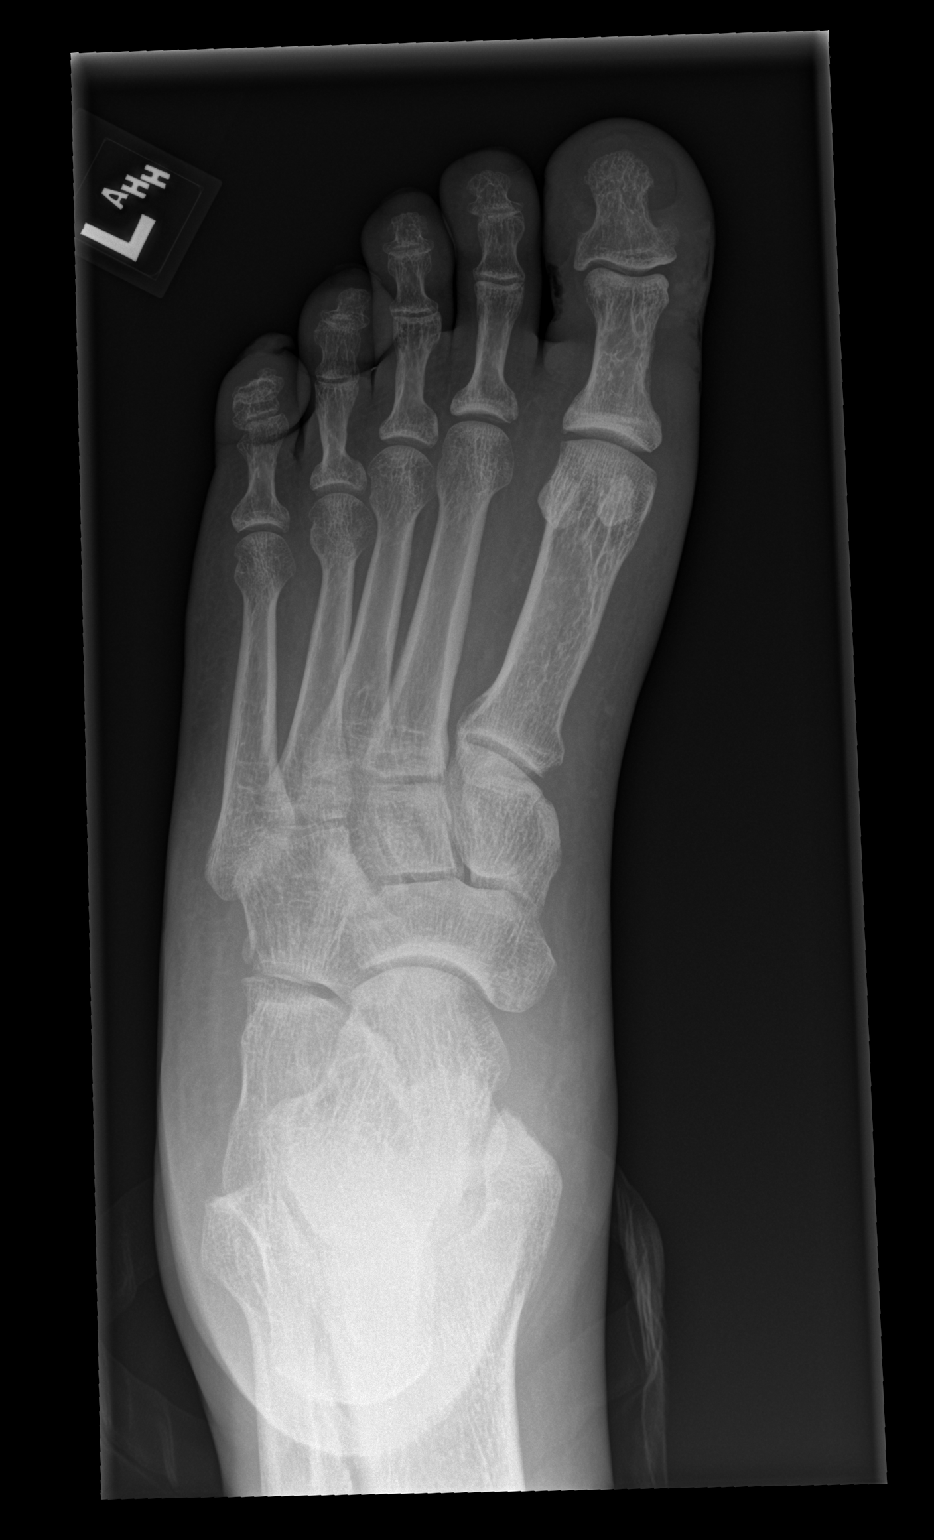

[x foot obl left]
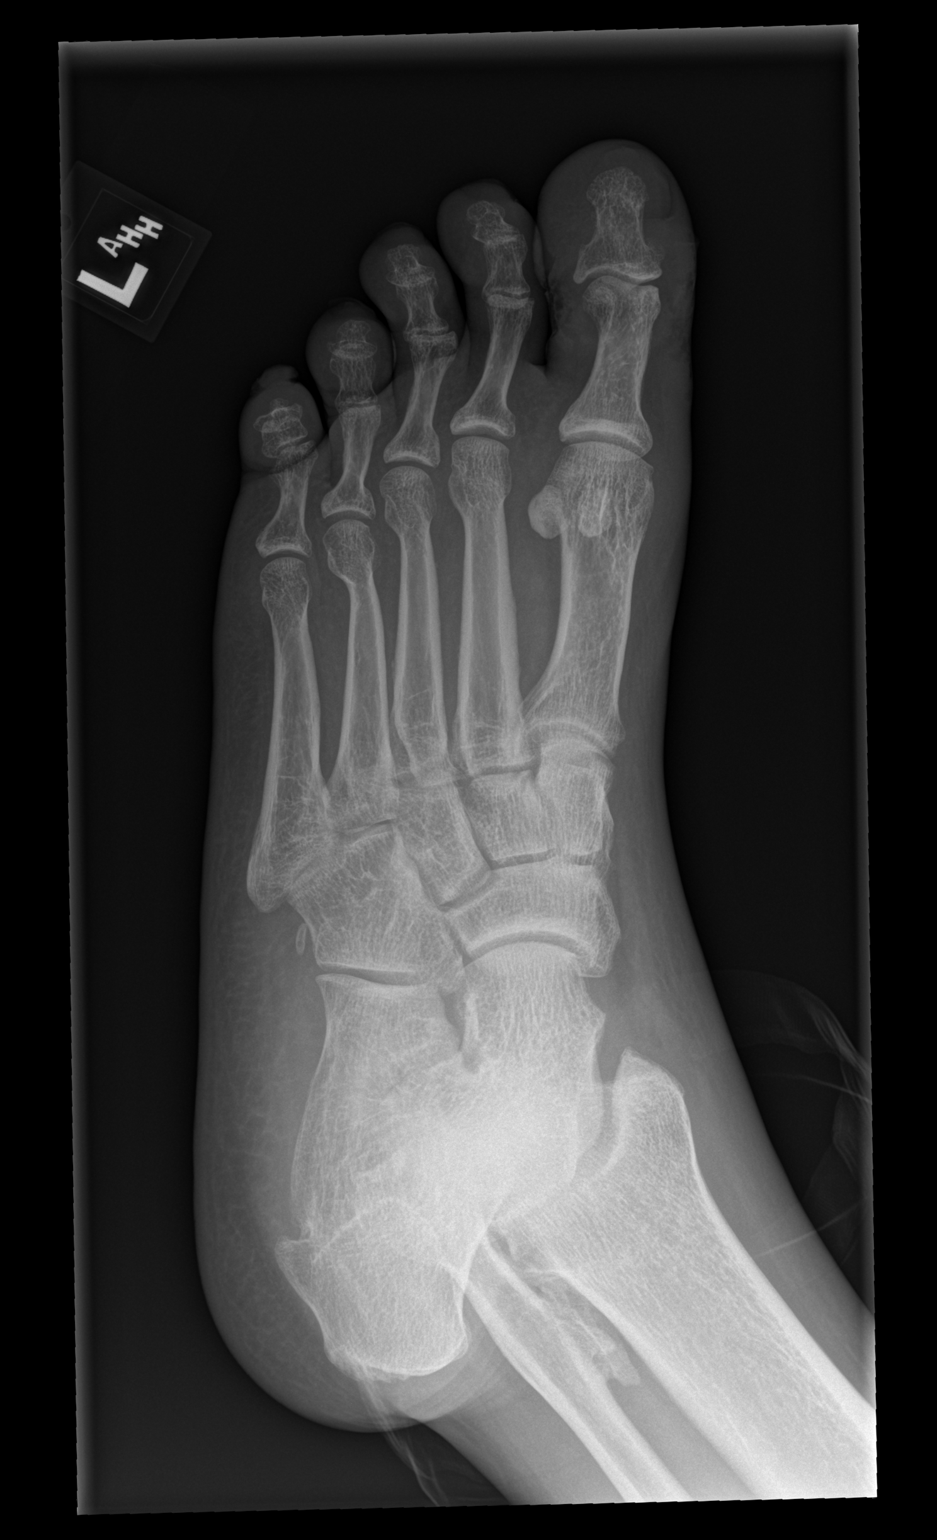

[x foot lat left]
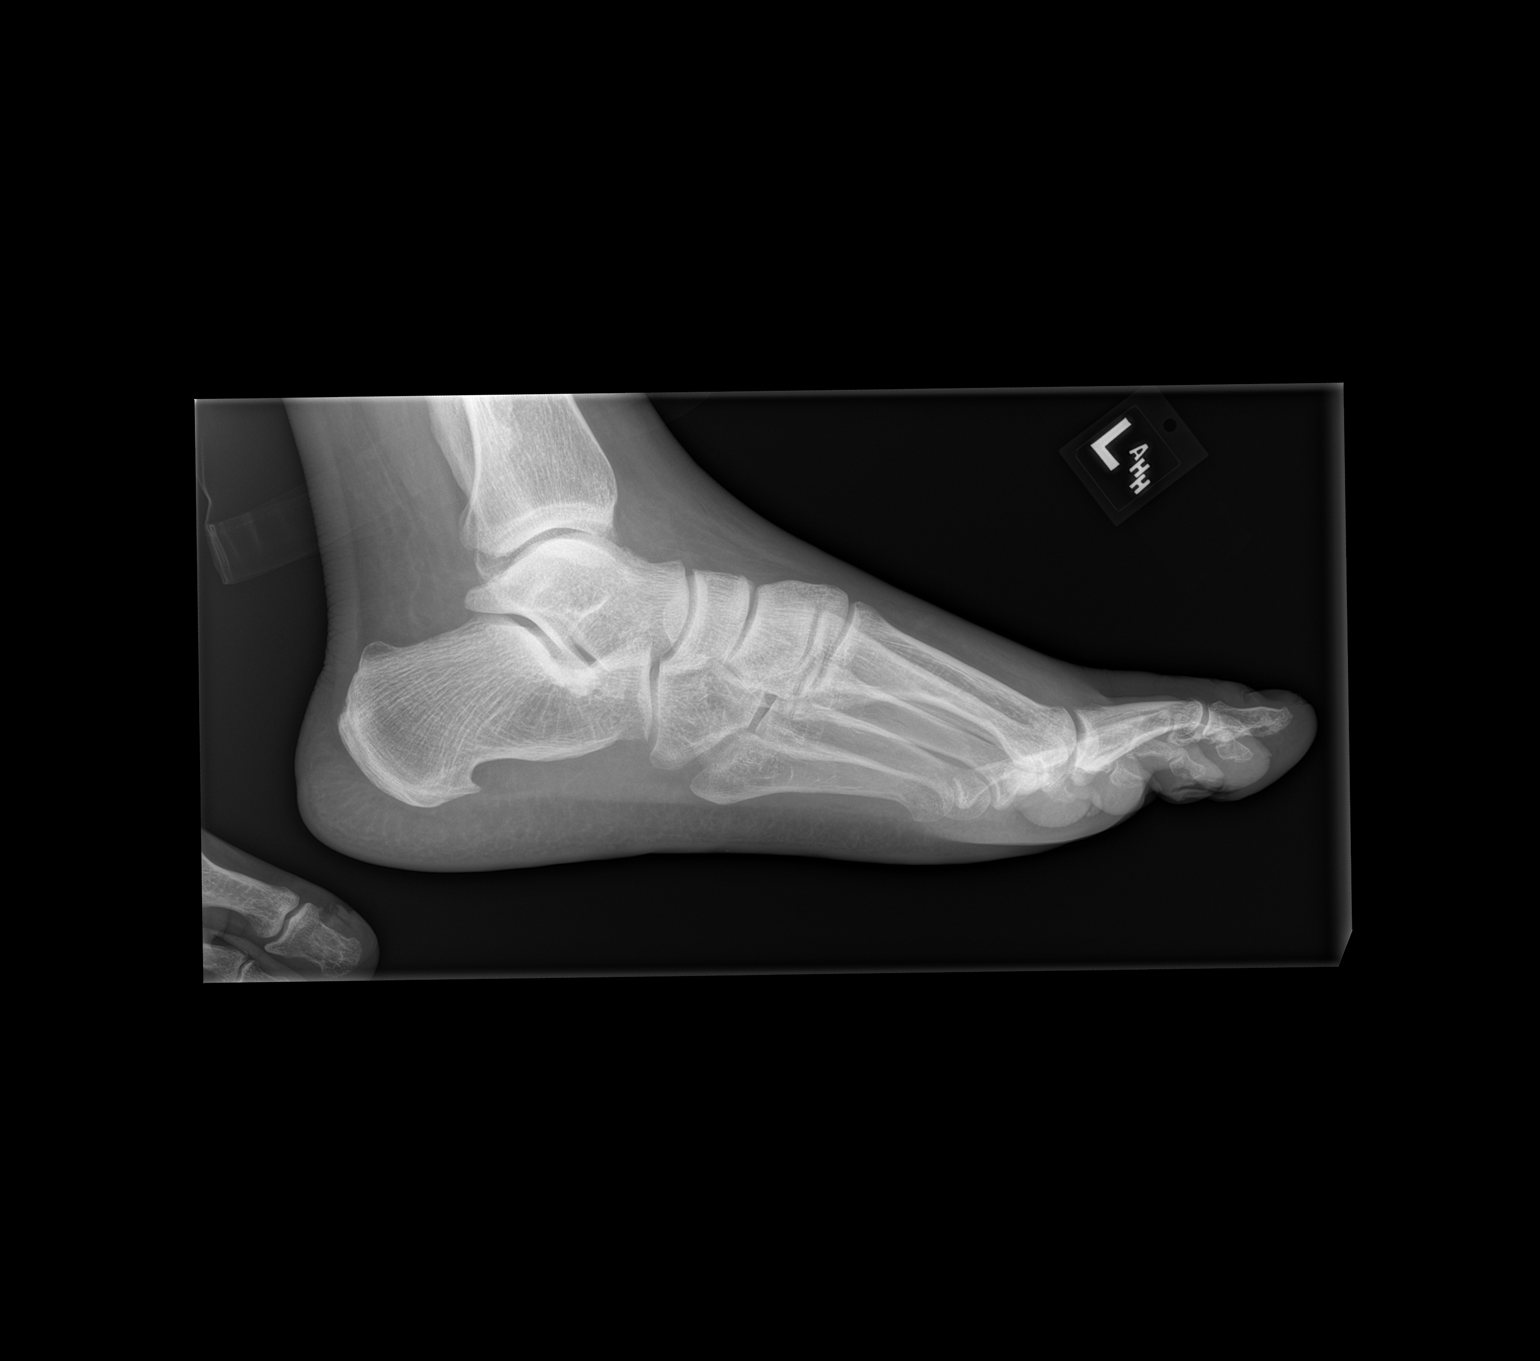

[3 of 3 positions shown; findings below may reference images not displayed]

FINDINGS: Persistent soft tissue abnormality at the left great toe, now also
involving the lateral surface of the great toe. There is no focal
osteolysis. No periosteal new bone formation. Mild generalized edema
of the left foot. No fracture or dislocation.
IMPRESSION: Increased extent of soft tissue abnormality of the left great toe
without radiographic evidence of osteomyelitis.

## 2019-05-24 IMAGING — MR MR FOOT*L* W/O CM
4 of 6 series · 19 of 40 positions shown · non-contrast
Comparison: Radiographs dated 12/25/2016

CLINICAL DATA: Nonhealing ulcer in the left great toe.

EXAM:
MRI OF THE LEFT FOOT WITHOUT CONTRAST
TECHNIQUE: Multiplanar, multisequence MR imaging of the left forefoot was
performed. No intravenous contrast was administered.

[Series 3: T1 · coronal · 4.0mm · 0.29mm/px · 5 of 34 slices shown (1 of 2)]
[im 1/34]
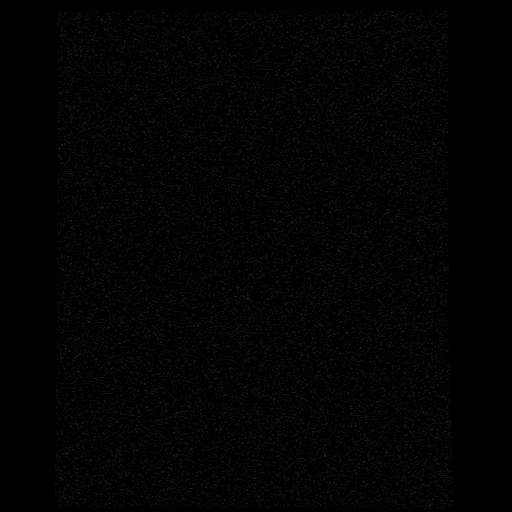
[im 5/34]
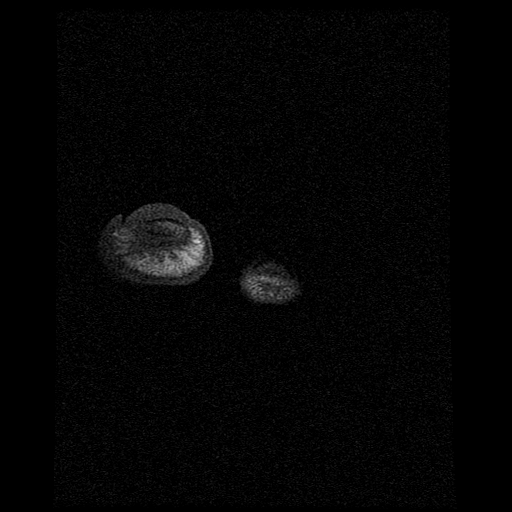
[im 9/34]
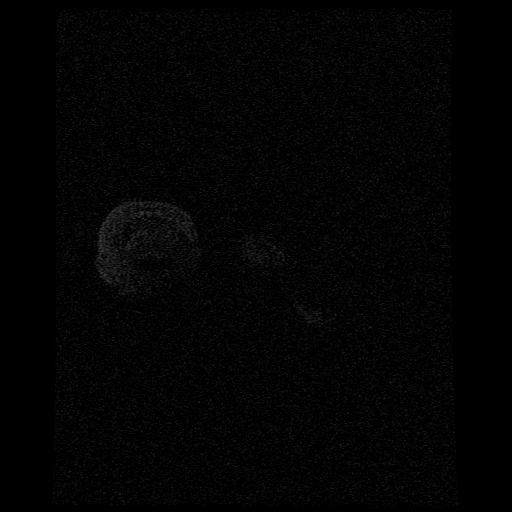
[im 17/34]
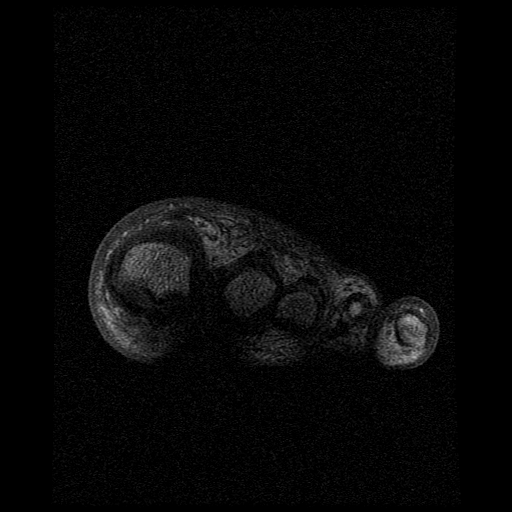
[im 29/34]
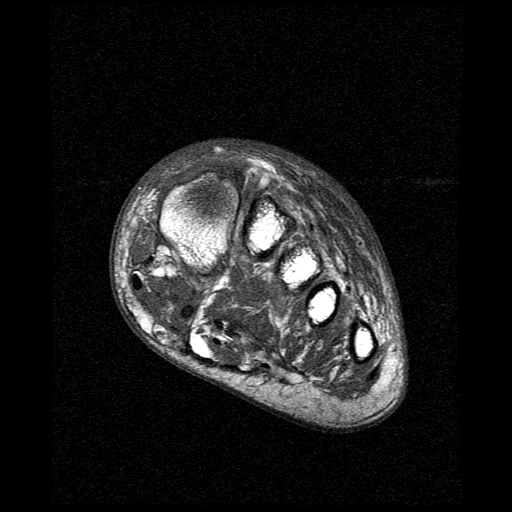

[Series 5: T2 fat-sat · coronal · 4.0mm · 0.29mm/px · 8 of 34 slices shown]
[im 1/34]
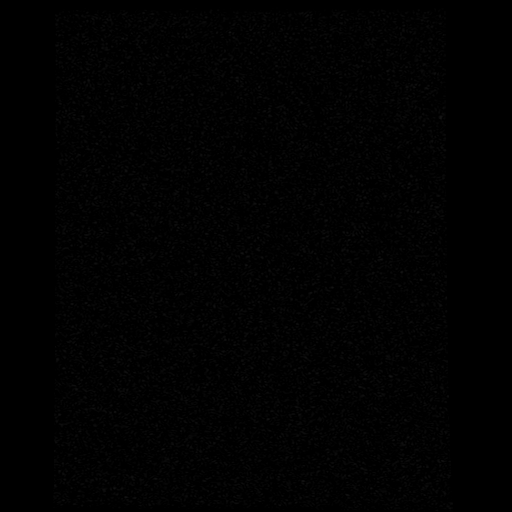
[im 5/34]
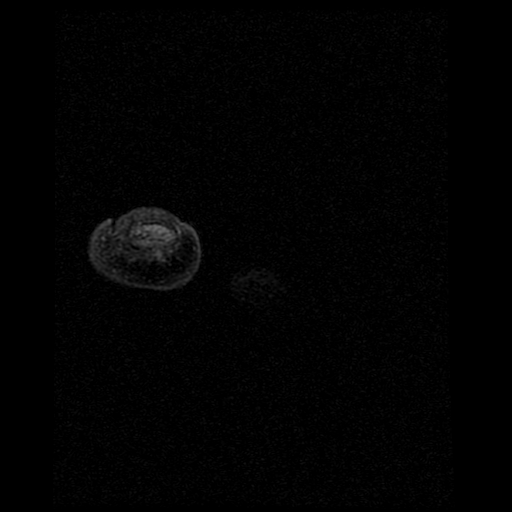
[im 10/34]
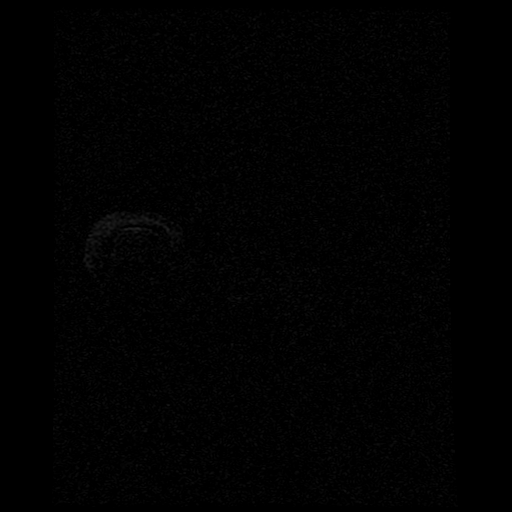
[im 15/34]
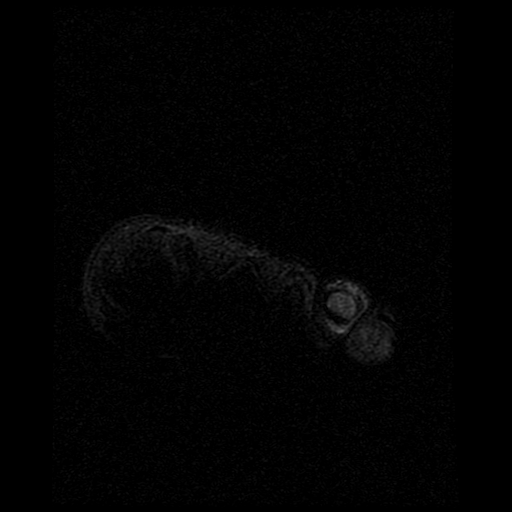
[im 19/34]
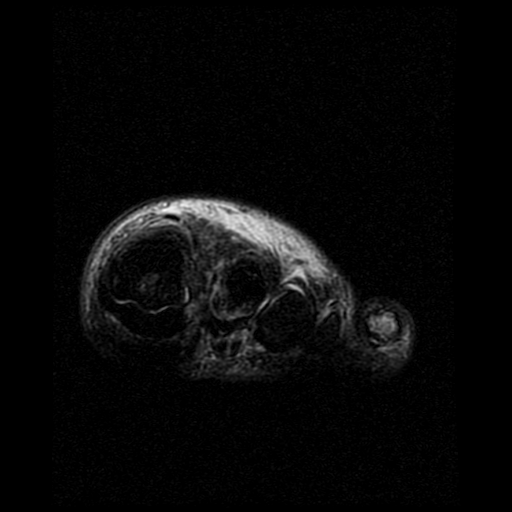
[im 24/34]
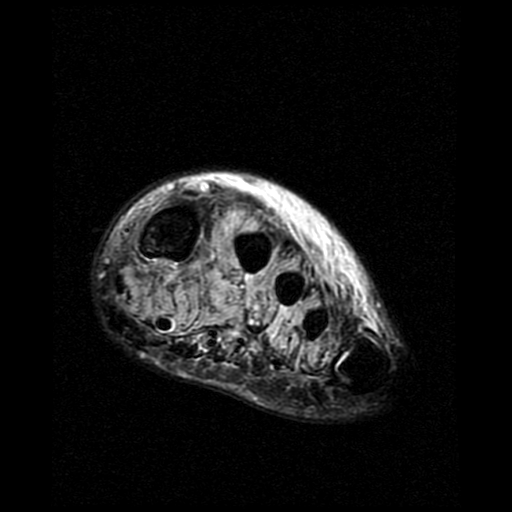
[im 29/34]
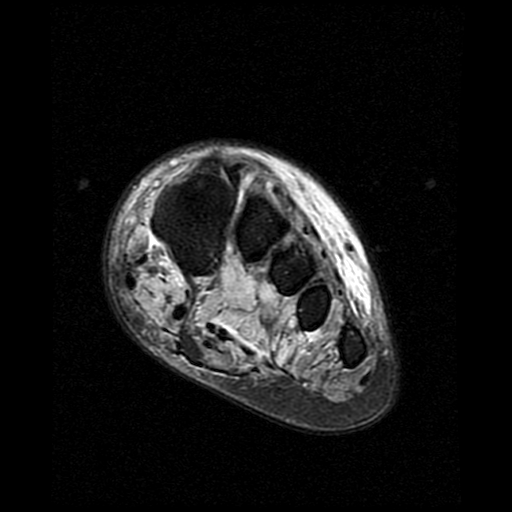
[im 34/34]
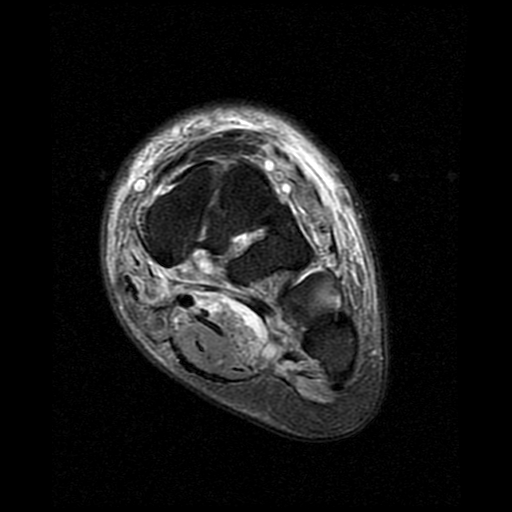

[Series 6: T1 fat-sat · coronal · non-contrast · 4.0mm · 0.29mm/px · 3 of 34 slices shown]
[im 5/34]
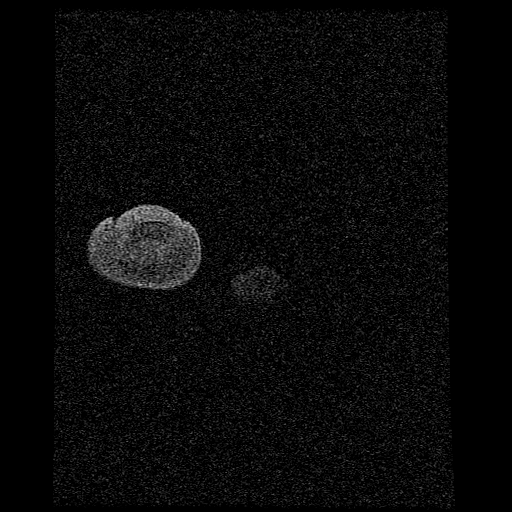
[im 19/34]
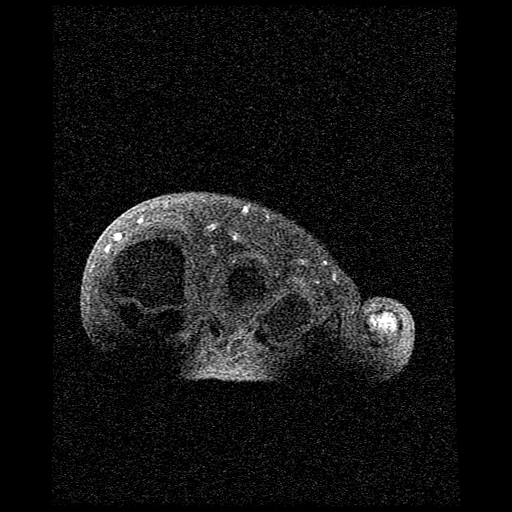
[im 29/34]
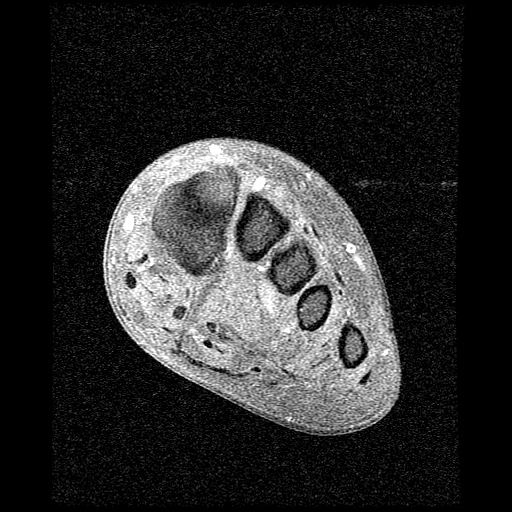

[Series 7: T1 · axial · 4.0mm · 0.35mm/px · z∈[-65,+37]mm · 3 of 22 slices shown (2 of 2)]
[im 1/22]
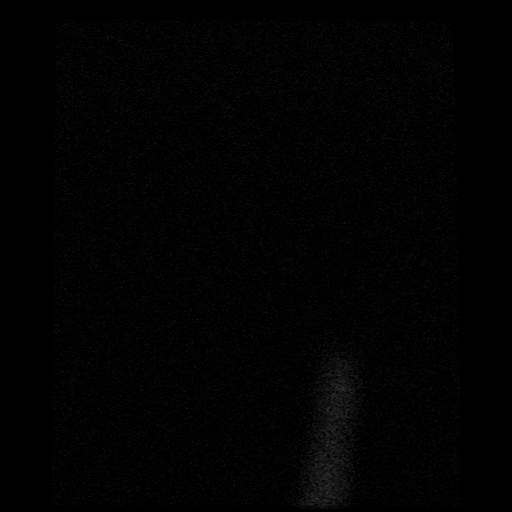
[im 11/22]
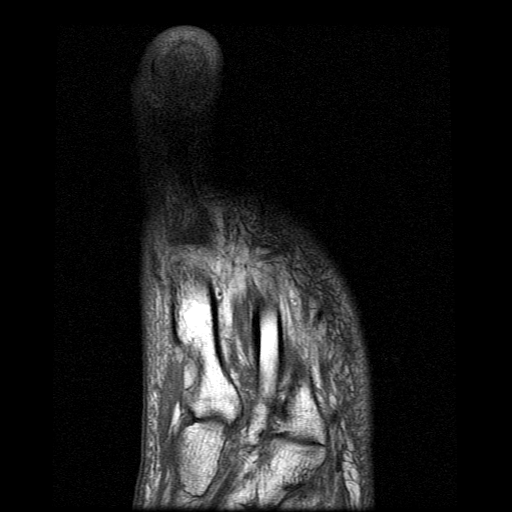
[im 22/22]
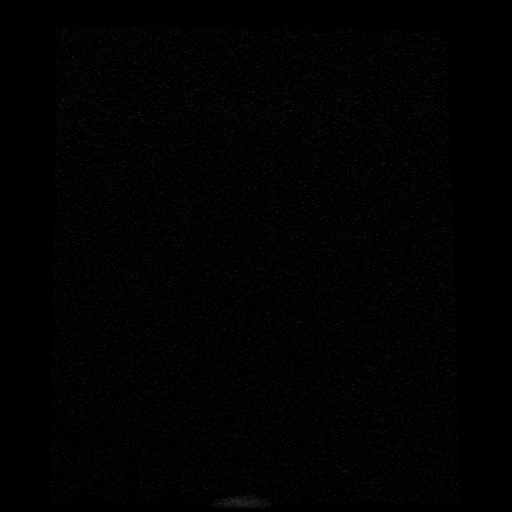

[19 of 40 positions shown; findings below may reference images not displayed]

FINDINGS: Bones/Joint/Cartilage

No bone destruction.  No significant joint effusion.

Artifact obscures detail of the first through fourth toes. Therefore
contrast was not utilized.

Soft tissues

Nonspecific subcutaneous edema on the dorsum of the foot. No
definable abscesses.
IMPRESSION: 1. Suboptimal study due to artifact.
2. No findings suggestive of osteomyelitis.
3. No soft tissue abscesses.
4. Nonspecific subcutaneous edema on the dorsum of the foot.

## 2019-06-14 ENCOUNTER — Other Ambulatory Visit: Payer: Self-pay

## 2019-06-17 ENCOUNTER — Ambulatory Visit: Payer: 59 | Admitting: Internal Medicine

## 2019-06-17 NOTE — Progress Notes (Deleted)
Name: Jonathan [name]  MRN/ DOB: [id number], 1981/02/17    Age/ Sex: 39 y.o., male    PCP: Azzie Glatter, FNP   Reason for Endocrinology Evaluation: Hypogonadism     Date of Initial Endocrinology Evaluation: 06/17/2019     HPI: Jonathan [name] is a 39 y.o. male with a past medical history of T2DM, ED, and hx of hypogonadism. The patient presented for initial endocrinology clinic visit on 06/17/2019 for consultative assistance with his low testosterone.   Pt has been diagnosed with hypogonadism in 2016. Was on Testosterone Cypionate at the time.       HISTORY:  Past Medical History:  Past Medical History:  Diagnosis Date  . Amputation of left great toe ([place]) 01/2019  . Decreased libido 04/2019  . Diabetes mellitus   . Erectile dysfunction 04/2019  . Low testosterone in male 04/2019  . Vitamin D deficiency 04/2019    Past Surgical History:  Past Surgical History:  Procedure Laterality Date  . AMPUTATION Left 02/02/2018   Procedure: LEFT GREAT TOE AMPUTATION;  Surgeon: Newt Minion, MD;  Location: [place];  Service: Orthopedics;  Laterality: Left;  . AMPUTATION Left 02/08/2019   Procedure: LEFT FOOT 1ST RAY AMPUTATION;  Surgeon: Newt Minion, MD;  Location: [place];  Service: Orthopedics;  Laterality: Left;      Social History:  reports that he has never smoked. He has never used smokeless tobacco. He reports current alcohol use of about 2.0 standard drinks of alcohol per week. He reports that he does not use drugs.  Family History: family history includes Other in his father and mother.   HOME MEDICATIONS: Allergies as of 06/17/2019   No Known Allergies     Medication List       Accurate as of June 17, 2019  7:44 AM. If you have any questions, ask your nurse or doctor.        atorvastatin 10 MG tablet Commonly known as: LIPITOR Take 1 tablet (10 mg total) by mouth daily.   blood glucose meter kit and supplies Dispense based on patient and  insurance preference. Use up to four times daily as directed. (FOR ICD-10 E10.9, E11.9).   gabapentin 300 MG capsule Commonly known as: NEURONTIN Take 1 capsule (300 mg total) by mouth at bedtime.   glucose blood test strip Use test strips to check blood sugar four times daily as directed   OneTouch Verio test strip Generic drug: glucose blood Use as instructed   HumaLOG Mix 75/25 KwikPen (75-25) 100 UNIT/ML Kwikpen Generic drug: Insulin Lispro Prot & Lispro INJECT 35 UNITS INTO THE SKIN 2 (TWO) TIMES DAILY.   ibuprofen 800 MG tablet Commonly known as: ADVIL Take 1 tablet (800 mg total) by mouth every 8 (eight) hours as needed. What changed: reasons to take this   metFORMIN 1000 MG tablet Commonly known as: GLUCOPHAGE TAKE 1 TABLET (1,000 MG TOTAL) BY MOUTH 2 (TWO) TIMES DAILY.   onetouch ultrasoft lancets Use 4 times daily as directed  E11.42, Z79.4   OneTouch Verio w/Device Kit Inject 1 Units into the skin QID. Use meter 4 times daily as directed   sildenafil 100 MG tablet Commonly known as: VIAGRA TAKE 1 TABLET (100 MG TOTAL) BY MOUTH DAILY AS NEEDED FOR ERECTILE DYSFUNCTION.   Vitamin D (Ergocalciferol) 1.25 MG (50000 UNIT) Caps capsule Commonly known as: DRISDOL Take 1 capsule (50,000 Units total) by mouth every 7 (seven) days.  REVIEW OF SYSTEMS: A comprehensive ROS was conducted with the patient and is negative except as per HPI and below:  ROS     OBJECTIVE:  VS: There were no vitals taken for this visit.   Wt Readings from Last 3 Encounters:  05/20/19 290 lb (131.5 kg)  04/29/19 290 lb (131.5 kg)  04/08/19 284 lb (128.8 kg)     EXAM: General: Pt appears well and is in NAD  Hydration: Well-hydrated with moist mucous membranes and good skin turgor  Eyes: External eye exam normal without stare, lid lag or exophthalmos.  EOM intact.  PERRL.  Ears, Nose, Throat: Hearing: Grossly intact bilaterally Dental: Good dentition  Throat: Clear  without mass, erythema or exudate  Neck: General: Supple without adenopathy. Thyroid: Thyroid size normal.  No goiter or nodules appreciated. No thyroid bruit.  Lungs: Clear with good BS bilat with no rales, rhonchi, or wheezes  Heart: Auscultation: RRR.  Abdomen: Normoactive bowel sounds, soft, nontender, without masses or organomegaly palpable  Extremities: Gait and station: Normal gait  Digits and nails: No clubbing, cyanosis, petechiae, or nodes Head and neck: Normal alignment and mobility BL UE: Normal ROM and strength. BL LE: No pretibial edema normal ROM and strength.  Skin: Hair: Texture and amount normal with gender appropriate distribution Skin Inspection: No rashes, acanthosis nigricans/skin tags. No lipohypertrophy Skin Palpation: Skin temperature, texture, and thickness normal to palpation  Neuro: Cranial nerves: II - XII grossly intact  Cerebellar: Normal coordination and movement; no tremor Motor: Normal strength throughout DTRs: 2+ and symmetric in UE without delay in relaxation phase  Mental Status: Judgment, insight: Intact Orientation: Oriented to time, place, and person Memory: Intact for recent and remote events Mood and affect: No depression, anxiety, or agitation     DATA REVIEWED: ***    ASSESSMENT/PLAN/RECOMMENDATIONS:   1. Hypogonadism:   Medications :  Signed electronically by: Mack Guise, MD  [facility]  [facility] [address], [place], [place] [address] Phone: [phone] FAX: [phone]   CC: Azzie Glatter, [place] [address] Phone: [phone] Fax: [phone]   Return to Endocrinology clinic as below: Future Appointments  Date Time Provider [place]  06/17/2019  8:30 AM Denisha Hoel, Melanie Crazier, MD [facility] None  07/30/2019  8:40 AM Azzie Glatter, FNP [facility] None

## 2019-07-01 ENCOUNTER — Other Ambulatory Visit: Payer: Self-pay | Admitting: Family Medicine

## 2019-07-01 MED FILL — HUMALOG MIX 75-25 KWIKPEN: (75-25) 100 | 30 days supply | Qty: 21 | Fill #0

## 2019-07-01 MED FILL — metFORMIN HCL 1000 MG TABS: 1000 | 30 days supply | Qty: 60 | Fill #1

## 2019-07-01 MED FILL — ATORVASTATIN 10 MG TABLET: 10 | 30 days supply | Qty: 30 | Fill #4

## 2019-07-01 MED FILL — SILDENAFIL CITRATE 100 MG T: 100 | 15 days supply | Qty: 4 | Fill #1

## 2019-07-11 ENCOUNTER — Ambulatory Visit (INDEPENDENT_AMBULATORY_CARE_PROVIDER_SITE_OTHER): Payer: 59 | Admitting: Physician Assistant

## 2019-07-11 ENCOUNTER — Other Ambulatory Visit: Payer: Self-pay

## 2019-07-11 ENCOUNTER — Encounter: Payer: Self-pay | Admitting: Orthopedic Surgery

## 2019-07-11 VITALS — Ht >= 80 in | Wt 290.0 lb

## 2019-07-11 DIAGNOSIS — M869 Osteomyelitis, unspecified: Secondary | ICD-10-CM

## 2019-07-11 NOTE — Progress Notes (Signed)
Office Visit Note   Patient: Jonathan [name]           Date of Birth: 02/12/1981           MRN: [id number] Visit Date: 07/11/2019              Requested by: Azzie Glatter, [place],  [place] [address] PCP: Azzie Glatter, FNP  Chief Complaint  Patient presents with  . Left Foot - Follow-up    02/08/19 left foot 1st ray amputation       HPI: This is a pleasant gentleman who is 5 months status post left foot first ray amputation.  He has been wearing a Vive compression sock.  His biggest concern today is that he seems to have 2 small areas of dark skin discoloration 1 over his anterior lateral ankle and the other over his dorsum of his foot.  He noticed these when he started wearing the socks he was wondering and was concerned that this was not infected or anything to worry about  Assessment & Plan: Visit Diagnoses: No diagnosis found.  Plan: Findings most likely related to improper pulling up of the sock causing some wrinkling.  I educated the patient that these are 2 very common areas where the sock can get wrinkled and showed him how to be sure that this does not happen.  If he has any concerns he may follow-up again otherwise he may follow-up as needed  Follow-Up Instructions: No follow-ups on file.   Ortho Exam  Patient is alert, oriented, no adenopathy, well-dressed, normal affect, normal respiratory effort. Focused examination of his leg and foot demonstrate well-healed surgical incision he has no erythema no cellulitis no open areas anywhere.  He has a little skin change right at the crease of his ankle and on the top of his foot.  There is no surrounding cellulitis there is no skin breakdown these are areas that coordinate where the sock often gets wrinkled  Imaging: No results found. No images are attached to the encounter.  Labs: Lab Results  Component Value Date   HGBA1C 8.8 (A) 04/29/2019   HGBA1C 9.0 (A) 01/14/2019   HGBA1C 10.6 (A)  08/27/2018   ESRSEDRATE 5 01/30/2018   ESRSEDRATE 23 (H) 12/26/2016   ESRSEDRATE 1 12/05/2016   CRP <0.8 01/30/2018   CRP 5.8 (H) 12/26/2016   CRP <0.8 12/05/2016   REPTSTATUS 02/04/2018 FINAL 01/30/2018   GRAMSTAIN  12/26/2016    FEW WBC PRESENT, PREDOMINANTLY PMN ABUNDANT GRAM POSITIVE COCCI IN PAIRS ABUNDANT GRAM NEGATIVE RODS Performed at [facility], [place] [address], [place], [place] [address]    CULT  01/30/2018    NO GROWTH 5 DAYS Performed at [facility] [address] [place], [place] [address]      Lab Results  Component Value Date   ALBUMIN 4.1 04/29/2019   ALBUMIN 3.9 02/16/2018   ALBUMIN 4.0 07/14/2017    No results found for: MG Lab Results  Component Value Date   VD25OH 8.8 (L) 04/29/2019    No results found for: PREALBUMIN CBC EXTENDED Latest Ref Rng & Units 04/29/2019 03/10/2018 02/16/2018  WBC 3.4 - 10.8 x10E3/uL 4.3 6.5 6.0  RBC 4.14 - 5.80 x10E6/uL 5.50 5.12 5.60  HGB 13.0 - 17.7 g/dL 13.5 12.7(L) 13.8  HCT 37.5 - 51.0 % 43.1 42.1 43.1  PLT 150 - 450 x10E3/uL 176 160 215  NEUTROABS 1.4 - 7.0 x10E3/uL 1.6 3.9 3.6  LYMPHSABS 0.7 - 3.1 x10E3/uL 2.3 1.9 1.9     Body mass index is 31.86 kg/m.  Orders:  No orders of the defined types were placed in this encounter.  No orders of the defined types were placed in this encounter.    Procedures: No procedures performed  Clinical Data: No additional findings.  ROS:  All other systems negative, except as noted in the HPI. Review of Systems  Objective: Vital Signs: Ht 6\' 8"  (2.032 m)   Wt 290 lb (131.5 kg)   BMI 31.86 kg/m   Specialty Comments:  No specialty comments available.  PMFS History: Patient Active Problem List   Diagnosis Date Noted  . Hemoglobin A1C between 7% and 9% indicating borderline diabetic control 04/29/2019  . Decreased libido 04/29/2019  . Low testosterone in male 04/2019  . 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 Cutaneous abscess of left foot   . Hemoglobin A1C greater than 9%, indicating poor diabetic control 08/27/2018  . Amputation of left great toe (HCC) 08/27/2018  . Osteomyelitis of great toe of left foot (HCC) 01/30/2018  . Osteomyelitis (HCC) 01/30/2018  . Depression with anxiety 01/30/2018  . Major depressive disorder, recurrent severe without psychotic features (HCC) 07/16/2017  . OD (overdose of drug), intentional self-harm, initial encounter (HCC) 07/15/2017  . Cellulitis 12/26/2016  . Open toe wound, subsequent encounter 12/12/2016  . Hyperglycemia 12/06/2016  . Diabetic ulcer of left foot (HCC) 12/05/2016  . Diabetic neuropathy (HCC) 10/16/2015  . Erectile dysfunction 08/05/2015  . Low testosterone 09/12/2014  . DM type 2, uncontrolled, with neuropathy (HCC) 06/12/2014  . Diabetes mellitus, type II (HCC) 03/23/2012  . Noncompliance 03/23/2012  . Gastroenteritis and colitis, viral 03/23/2012   Past Medical History:  Diagnosis Date  . Amputation of left great toe (HCC) 01/2019  . Decreased libido 04/2019  . Diabetes mellitus   . Erectile dysfunction 04/2019  . Low testosterone in male 04/2019  . Vitamin D deficiency 04/2019    Family History  Problem Relation Age of Onset  . Other Mother        Healthy  . Other Father        Healthy    Past Surgical History:  Procedure Laterality Date  . AMPUTATION Left 02/02/2018   Procedure: LEFT GREAT TOE AMPUTATION;  Surgeon: 14/07/2017, MD;  Location: [facility] OR;  Service: Orthopedics;  Laterality: Left;  . AMPUTATION Left 02/08/2019   Procedure: LEFT FOOT 1ST RAY AMPUTATION;  Surgeon: 14/12/2018, MD;  Location: [facility] OR;  Service: Orthopedics;  Laterality: Left;   Social History   Occupational History  . Not on file  Tobacco Use  . Smoking status: Never Smoker  . Smokeless tobacco: Never Used  Substance and Sexual Activity  . Alcohol use: Yes    Alcohol/week: 2.0 standard drinks    Types: 1 Glasses of  wine, 1 Cans of beer per week    Comment: occassional  . Drug use: No  . Sexual activity: Yes

## 2019-07-12 NOTE — Progress Notes (Signed)
Name: Jonathan [name]  MRN/ DOB: [id number], November 10, 1980    Age/ Sex: 39 y.o., male    PCP: Azzie Glatter, FNP   Reason for Endocrinology Evaluation: Hypogonadism     Date of Initial Endocrinology Evaluation: 07/15/2019     HPI: [name] is a 39 y.o. male with a past medical history of T2DM, ED, and hx of hypogonadism. The patient presented for initial endocrinology clinic visit on 07/15/2019 for consultative assistance with his low testosterone.   Pt has been diagnosed with hypogonadism in 2016. He has had multiple testosterone readings with a nadir of 125 ng/dL, as well as low sex hormone binding globulin. He was on Testosterone Cypionate at some point.   He was having ED at the time, associated with fatigue.  He denies long term narcotic use  No marijuana , no tobacco use nor OTC supplement use NO decrease in shaving frequency  Denies noticing any testicular atrophy  He has tried to conceive in the past but without success.  Does not recall any childhood infections  NO FH of prostate cancer   Denies headaches or visual changes but has had one episode of headaches over the weekend    HISTORY:  Past Medical History:  Past Medical History:  Diagnosis Date  . Amputation of left great toe ([place]) 01/2019  . Decreased libido 04/2019  . Diabetes mellitus   . Erectile dysfunction 04/2019  . Low testosterone in male 04/2019  . Vitamin D deficiency 04/2019   Past Surgical History:  Past Surgical History:  Procedure Laterality Date  . AMPUTATION Left 02/02/2018   Procedure: LEFT GREAT TOE AMPUTATION;  Surgeon: Newt Minion, MD;  Location: [facility];  Service: Orthopedics;  Laterality: Left;  . AMPUTATION Left 02/08/2019   Procedure: LEFT FOOT 1ST RAY AMPUTATION;  Surgeon: Newt Minion, MD;  Location: [facility];  Service: Orthopedics;  Laterality: Left;      Social History:  reports that he has never smoked. He has never used smokeless tobacco. He reports current  alcohol use of about 2.0 standard drinks of alcohol per week. He reports that he does not use drugs.  Family History: family history includes Other in his father and mother.   HOME MEDICATIONS: Allergies as of 07/15/2019   No Known Allergies     Medication List       Accurate as of Jul 15, 2019  7:30 AM. If you have any questions, ask your nurse or doctor.        atorvastatin 10 MG tablet Commonly known as: LIPITOR Take 1 tablet (10 mg total) by mouth daily.   blood glucose meter kit and supplies Dispense based on patient and insurance preference. Use up to four times daily as directed. (FOR ICD-10 E10.9, E11.9).   gabapentin 300 MG capsule Commonly known as: NEURONTIN Take 1 capsule (300 mg total) by mouth at bedtime.   glucose blood test strip Use test strips to check blood sugar four times daily as directed   OneTouch Verio test strip Generic drug: glucose blood Use as instructed   HumaLOG Mix 75/25 KwikPen (75-25) 100 UNIT/ML Kwikpen Generic drug: Insulin Lispro Prot & Lispro INJECT 35 UNITS INTO THE SKIN 2 (TWO) TIMES DAILY.   ibuprofen 800 MG tablet Commonly known as: ADVIL Take 1 tablet (800 mg total) by mouth every 8 (eight) hours as needed. What changed: reasons to take this   metFORMIN 1000 MG tablet Commonly known as: GLUCOPHAGE TAKE 1 TABLET (1,000  MG TOTAL) BY MOUTH 2 (TWO) TIMES DAILY.   onetouch ultrasoft lancets Use 4 times daily as directed  E11.42, Z79.4   OneTouch Verio w/Device Kit Inject 1 Units into the skin QID. Use meter 4 times daily as directed   sildenafil 100 MG tablet Commonly known as: VIAGRA TAKE 1 TABLET (100 MG TOTAL) BY MOUTH DAILY AS NEEDED FOR ERECTILE DYSFUNCTION.   Vitamin D (Ergocalciferol) 1.25 MG (50000 UNIT) Caps capsule Commonly known as: DRISDOL Take 1 capsule (50,000 Units total) by mouth every 7 (seven) days.         REVIEW OF SYSTEMS: A comprehensive ROS was conducted with the patient and is negative  except as per HPI and below:  ROS     OBJECTIVE:  VS: BP 118/78 (BP Location: Left Arm, Patient Position: Sitting, Cuff Size: Large)   Pulse 93   Temp 98.2 F (36.8 C)   Ht '6\' 8"'  (2.032 m)   Wt 286 lb (129.7 kg)   SpO2 99%   BMI 31.42 kg/m    Wt Readings from Last 3 Encounters:  07/15/19 286 lb (129.7 kg)  07/11/19 290 lb (131.5 kg)  05/20/19 290 lb (131.5 kg)     EXAM: General: Pt appears well and is in NAD  Eyes: External eye exam normal without stare, lid lag or exophthalmos.  EOM intact.    Neck: General: Supple without adenopathy. Thyroid: Thyroid size normal.  No goiter or nodules appreciated. No thyroid bruit.  Lungs: Clear with good BS bilat with no rales, rhonchi, or wheezes  Chest : Pseudogynecomastia bilaterally   Heart: Auscultation: RRR.  Abdomen: Normoactive bowel sounds, soft, nontender, without masses or organomegaly palpable  Genital: Normal penile exam, testicular volume 20 mL B/L  Extremities:  BL LE: Trace pretibial edema normal ROM and strength.  Skin: Hair: Texture and amount normal with gender appropriate distribution Skin Inspection: No rashes Skin Palpation: Skin temperature, texture, and thickness normal to palpation  Mental Status: Judgment, insight: Intact Orientation: Oriented to time, place, and person Mood and affect: No depression, anxiety, or agitation     DATA REVIEWED:   Results for Jonathan, [name] (MRN [id number]) as of 07/16/2019 07:24  Ref. Range 07/15/2019 07:53  Sodium Latest Ref Range: 135 - 145 mEq/L 138  Potassium Latest Ref Range: 3.5 - 5.1 mEq/L 4.3  Chloride Latest Ref Range: 96 - 112 mEq/L 101  CO2 Latest Ref Range: 19 - 32 mEq/L 30  Glucose Latest Ref Range: 70 - 99 mg/dL 192 (H)  BUN Latest Ref Range: 6 - 23 mg/dL 10  Creatinine Latest Ref Range: 0.40 - 1.50 mg/dL 0.88  Calcium Latest Ref Range: 8.4 - 10.5 mg/dL 9.3  Alkaline Phosphatase Latest Ref Range: 39 - 117 U/L 84  Albumin Latest Ref Range: 3.5 - 5.2 g/dL  4.2  AST Latest Ref Range: 0 - 37 U/L 30  ALT Latest Ref Range: 0 - 53 U/L 37  Total Protein Latest Ref Range: 6.0 - 8.3 g/dL 7.1  Total Bilirubin Latest Ref Range: 0.2 - 1.2 mg/dL 0.3  GFR Latest Ref Range: >60.00 mL/min 117.60  WBC Latest Ref Range: 4.0 - 10.5 K/uL 3.9 (L)  RBC Latest Ref Range: 4.22 - 5.81 Mil/uL 5.72  Hemoglobin Latest Ref Range: 13.0 - 17.0 g/dL 14.4  HCT Latest Ref Range: 39.0 - 52.0 % 44.2  MCV Latest Ref Range: 78.0 - 100.0 fl 77.3 (L)  MCHC Latest Ref Range: 30.0 - 36.0 g/dL 32.6  RDW Latest Ref Range: 11.5 -  15.5 % 14.6  Platelets Latest Ref Range: 150.0 - 400.0 K/uL 150.0  Neutrophils Latest Ref Range: 43.0 - 77.0 % 35.1 (L)  Lymphocytes Latest Ref Range: 12.0 - 46.0 % 55.1 Repeated and verified X2. (H)  Monocytes Relative Latest Ref Range: 3.0 - 12.0 % 8.2  Eosinophil Latest Ref Range: 0.0 - 5.0 % 1.3  Basophil Latest Ref Range: 0.0 - 3.0 % 0.3  NEUT# Latest Ref Range: 1.4 - 7.7 K/uL 1.4  Lymphocyte # Latest Ref Range: 0.7 - 4.0 K/uL 2.1  Monocyte # Latest Ref Range: 0.1 - 1.0 K/uL 0.3  Eosinophils Absolute Latest Ref Range: 0.0 - 0.7 K/uL 0.0  Basophils Absolute Latest Ref Range: 0.0 - 0.1 K/uL 0.0  LH Latest Ref Range: 1.50 - 9.30 mIU/mL 7.54  FSH Latest Ref Range: 1.4 - 18.1 mIU/ML 3.4  Prolactin Latest Ref Range: 2.0 - 18.0 ng/mL 7.3  TSH Latest Ref Range: 0.35 - 4.50 uIU/mL 1.74  T4,Free(Direct) Latest Ref Range: 0.60 - 1.60 ng/dL 0.93  PSA Latest Ref Range: 0.10 - 4.00 ng/mL 0.50   - Testosterone - pending  ASSESSMENT/PLAN/RECOMMENDATIONS:   1. Hypogonadism:  - We discussed the endocrine society guidelines in the checking testosterone fasting and early in the morning. Most of his prior testosterone checks have been in the afternoon and most likely non-fasting - We also discussed part of his low total testosterone is the low sex hormone binding globulin (SHBG) which is caused by obesity and DM   - We discussed side effects of testosterone  therapy such as erythropoiesis, worsening of sleep apnea that is severe and untreated,  Prostate volumes and serum PSA increase in response to testosterone treatment which might increase BPH and worsen urinary outflow obstruction as well as prostate cancer risk.  - We also discussed there is a possibility of increased cardiovascular risk associated with testosterone use. - In men who desire fertility , testosterone may not be the preferred treatment . If the pt has been confirmed to have hypogonadism, will have to determine if this is primary vs secondary. In secondary hypogonadism, will consider gonadotropin therapy vs Clomiphene to preserve fertility.  - Will proceed with repeat labs today to include LH, FSH, prolactin, total and free testosterone and TFT's  - TFT's and prolactin as well as CMP and PSA are normal.  - Awaiting on testosterone      F/U in 3 months     Signed electronically by: Mack Guise, MD  [facility]  [facility] [address] [place], [place] [address] Phone: [phone] FAX: [phone]   CC: Azzie Glatter, [place] [address] Phone: [phone] Fax: [phone]   Return to Endocrinology clinic as below: Future Appointments  Date Time Provider [place]  07/30/2019  8:40 AM Azzie Glatter, FNP [facility] None

## 2019-07-15 ENCOUNTER — Other Ambulatory Visit: Payer: Self-pay

## 2019-07-15 ENCOUNTER — Ambulatory Visit (INDEPENDENT_AMBULATORY_CARE_PROVIDER_SITE_OTHER): Payer: 59 | Admitting: Internal Medicine

## 2019-07-15 ENCOUNTER — Encounter: Payer: Self-pay | Admitting: Internal Medicine

## 2019-07-15 VITALS — BP 118/78 | HR 93 | Temp 98.2°F | Ht >= 80 in | Wt 286.0 lb

## 2019-07-15 DIAGNOSIS — R7989 Other specified abnormal findings of blood chemistry: Secondary | ICD-10-CM | POA: Diagnosis not present

## 2019-07-15 DIAGNOSIS — E291 Testicular hypofunction: Secondary | ICD-10-CM

## 2019-07-15 LAB — CBC WITH DIFFERENTIAL/PLATELET
Basophils Absolute: 0 10*3/uL (ref 0.0–0.1)
Basophils Relative: 0.3 % (ref 0.0–3.0)
Eosinophils Absolute: 0 10*3/uL (ref 0.0–0.7)
Eosinophils Relative: 1.3 % (ref 0.0–5.0)
HCT: 44.2 % (ref 39.0–52.0)
Hemoglobin: 14.4 g/dL (ref 13.0–17.0)
Lymphocytes Relative: 55.1 % — ABNORMAL HIGH (ref 12.0–46.0)
Lymphs Abs: 2.1 10*3/uL (ref 0.7–4.0)
MCHC: 32.6 g/dL (ref 30.0–36.0)
MCV: 77.3 fl — ABNORMAL LOW (ref 78.0–100.0)
Monocytes Absolute: 0.3 10*3/uL (ref 0.1–1.0)
Monocytes Relative: 8.2 % (ref 3.0–12.0)
Neutro Abs: 1.4 10*3/uL (ref 1.4–7.7)
Neutrophils Relative %: 35.1 % — ABNORMAL LOW (ref 43.0–77.0)
Platelets: 150 10*3/uL (ref 150.0–400.0)
RBC: 5.72 Mil/uL (ref 4.22–5.81)
RDW: 14.6 % (ref 11.5–15.5)
WBC: 3.9 10*3/uL — ABNORMAL LOW (ref 4.0–10.5)

## 2019-07-15 LAB — T4, FREE: Free T4: 0.93 ng/dL (ref 0.60–1.60)

## 2019-07-15 LAB — LUTEINIZING HORMONE: LH: 7.54 m[IU]/mL (ref 1.50–9.30)

## 2019-07-15 LAB — COMPREHENSIVE METABOLIC PANEL
ALT: 37 U/L (ref 0–53)
AST: 30 U/L (ref 0–37)
Albumin: 4.2 g/dL (ref 3.5–5.2)
Alkaline Phosphatase: 84 U/L (ref 39–117)
BUN: 10 mg/dL (ref 6–23)
CO2: 30 mEq/L (ref 19–32)
Calcium: 9.3 mg/dL (ref 8.4–10.5)
Chloride: 101 mEq/L (ref 96–112)
Creatinine, Ser: 0.88 mg/dL (ref 0.40–1.50)
GFR: 117.6 mL/min (ref >60.00)
Glucose, Bld: 192 mg/dL — ABNORMAL HIGH (ref 70–99)
Potassium: 4.3 mEq/L (ref 3.5–5.1)
Sodium: 138 mEq/L (ref 135–145)
Total Bilirubin: 0.3 mg/dL (ref 0.2–1.2)
Total Protein: 7.1 g/dL (ref 6.0–8.3)

## 2019-07-15 LAB — FOLLICLE STIMULATING HORMONE: FSH: 3.4 m[IU]/mL (ref 1.4–18.1)

## 2019-07-15 LAB — TSH: TSH: 1.74 u[IU]/mL (ref 0.35–4.50)

## 2019-07-15 LAB — PSA: PSA: 0.5 ng/mL (ref 0.10–4.00)

## 2019-07-15 NOTE — Patient Instructions (Signed)
-   Please stop by the lab today - will contact you with results  

## 2019-07-17 ENCOUNTER — Encounter: Payer: Self-pay | Admitting: Internal Medicine

## 2019-07-17 ENCOUNTER — Telehealth: Payer: Self-pay | Admitting: Internal Medicine

## 2019-07-17 DIAGNOSIS — E230 Hypopituitarism: Secondary | ICD-10-CM

## 2019-07-17 LAB — PROLACTIN: Prolactin: 7.3 ng/mL (ref 2.0–18.0)

## 2019-07-17 LAB — TESTOSTERONE, TOTAL, LC/MS/MS: Testosterone, Total, LC-MS-MS: 167 ng/dL — ABNORMAL LOW (ref 250–1100)

## 2019-07-17 NOTE — Telephone Encounter (Addendum)
Attempted to call the pt, left a voice mail to check his portal.   Pt with very low testosterone, with an inappropriately normal LH and FSH, will need to proceed with Brain MRI to r/o pituitary adenoma   Pt agreed to proceed with MRI through the portal    MRI ordered      Abby Raelyn Mora, MD  [facility]  [facility] [address] [place], [place] [address] Phone: [phone] FAX: [phone]

## 2019-07-17 NOTE — Telephone Encounter (Signed)
Patient calling requesting MRI referral to be placed so he can schedule it as soon as possible.

## 2019-07-21 LAB — TESTOSTERONE FREE MS/DIALYSIS
Free Testosterone, Serum: 58 pg/mL
Testosterone, Serum (Total): 153 ng/dL — ABNORMAL LOW
Testosterone-% Free: 3.8 %

## 2019-07-30 ENCOUNTER — Ambulatory Visit: Payer: Self-pay | Admitting: Family Medicine

## 2019-08-07 ENCOUNTER — Telehealth: Payer: Self-pay | Admitting: Family Medicine

## 2019-08-07 NOTE — Telephone Encounter (Signed)
Left voicemail to confirm the preferred pharmacy.

## 2019-08-07 NOTE — Telephone Encounter (Signed)
Pt wants insulin, metphormin, gabapentin, vitamin d, and cholesterol meds sent to his preferred pharmacy[facility] not [facility].

## 2019-08-08 ENCOUNTER — Other Ambulatory Visit: Payer: Self-pay | Admitting: Family Medicine

## 2019-08-08 DIAGNOSIS — R7989 Other specified abnormal findings of blood chemistry: Secondary | ICD-10-CM

## 2019-08-08 DIAGNOSIS — E118 Type 2 diabetes mellitus with unspecified complications: Secondary | ICD-10-CM

## 2019-08-08 DIAGNOSIS — IMO0002 Reserved for concepts with insufficient information to code with codable children: Secondary | ICD-10-CM

## 2019-08-08 DIAGNOSIS — E559 Vitamin D deficiency, unspecified: Secondary | ICD-10-CM

## 2019-08-08 MED ORDER — VITAMIN D (ERGOCALCIFEROL) 1.25 MG (50000 UNIT) PO CAPS: 50000.0000 [IU] | ORAL_CAPSULE | ORAL | 6 refills | Status: DC

## 2019-08-08 MED ORDER — INSULIN LISPRO PROT & LISPRO (75-25 MIX) 100 UNIT/ML KWIKPEN: PEN_INJECTOR | SUBCUTANEOUS | 3 refills | Status: DC

## 2019-08-08 MED ORDER — ATORVASTATIN CALCIUM 10 MG PO TABS: 10.0000 mg | ORAL_TABLET | Freq: Every day | ORAL | 6 refills | Status: DC

## 2019-08-08 MED ORDER — METFORMIN HCL 1000 MG PO TABS: 1000.0000 mg | ORAL_TABLET | Freq: Two times a day (BID) | ORAL | 0 refills | Status: DC

## 2019-08-08 NOTE — Telephone Encounter (Signed)
Rx has been sent to preferred pharmacy. Left voicemail to let patient know.

## 2019-08-20 ENCOUNTER — Ambulatory Visit (INDEPENDENT_AMBULATORY_CARE_PROVIDER_SITE_OTHER): Payer: 59 | Admitting: Family Medicine

## 2019-08-20 ENCOUNTER — Other Ambulatory Visit: Payer: Self-pay

## 2019-08-20 ENCOUNTER — Encounter: Payer: Self-pay | Admitting: Family Medicine

## 2019-08-20 VITALS — BP 141/81 | HR 88 | Temp 97.1°F | Ht >= 80 in | Wt 291.0 lb

## 2019-08-20 DIAGNOSIS — R7309 Other abnormal glucose: Secondary | ICD-10-CM | POA: Diagnosis not present

## 2019-08-20 DIAGNOSIS — F419 Anxiety disorder, unspecified: Secondary | ICD-10-CM

## 2019-08-20 DIAGNOSIS — R739 Hyperglycemia, unspecified: Secondary | ICD-10-CM | POA: Diagnosis not present

## 2019-08-20 DIAGNOSIS — R6882 Decreased libido: Secondary | ICD-10-CM

## 2019-08-20 DIAGNOSIS — R7989 Other specified abnormal findings of blood chemistry: Secondary | ICD-10-CM

## 2019-08-20 DIAGNOSIS — Z794 Long term (current) use of insulin: Secondary | ICD-10-CM

## 2019-08-20 DIAGNOSIS — R8290 Unspecified abnormal findings in urine: Secondary | ICD-10-CM

## 2019-08-20 DIAGNOSIS — E118 Type 2 diabetes mellitus with unspecified complications: Secondary | ICD-10-CM

## 2019-08-20 DIAGNOSIS — Z09 Encounter for follow-up examination after completed treatment for conditions other than malignant neoplasm: Secondary | ICD-10-CM

## 2019-08-20 LAB — POCT GLYCOSYLATED HEMOGLOBIN (HGB A1C)
HbA1c POC (<> result, manual entry): 10.8 % (ref 4.0–5.6)
HbA1c, POC (controlled diabetic range): 10.8 % — AB (ref 0.0–7.0)
HbA1c, POC (prediabetic range): 10.8 % — AB (ref 5.7–6.4)
Hemoglobin A1C: 10.8 % — AB (ref 4.0–5.6)

## 2019-08-20 LAB — POCT URINALYSIS DIPSTICK
Bilirubin, UA: NEGATIVE
Glucose, UA: POSITIVE — AB
Ketones, UA: NEGATIVE
Leukocytes, UA: NEGATIVE
Nitrite, UA: NEGATIVE
Protein, UA: NEGATIVE
Spec Grav, UA: 1.025 (ref 1.010–1.025)
Urobilinogen, UA: 0.2 E.U./dL
pH, UA: 5.5 (ref 5.0–8.0)

## 2019-08-20 LAB — GLUCOSE, POCT (MANUAL RESULT ENTRY): POC Glucose: 182 mg/dl — AB (ref 70–99)

## 2019-08-20 MED ORDER — VICTOZA 18 MG/3ML ~~LOC~~ SOPN: PEN_INJECTOR | SUBCUTANEOUS | 99 refills | Status: DC

## 2019-08-20 MED ORDER — LANTUS SOLOSTAR 100 UNIT/ML ~~LOC~~ SOPN: 50.0000 [IU] | PEN_INJECTOR | Freq: Every day | SUBCUTANEOUS | 99 refills | Status: DC

## 2019-08-20 NOTE — Progress Notes (Signed)
Patient [facility]    Established Patient Office Visit  Subjective:  Patient ID: Jonathan [name], male    DOB: 1980/05/08  Age: 39 y.o. MRN: [id number]  CC:  Chief Complaint  Patient presents with  . Follow-up    3 month follow up; medication refills  . Medication Reaction    Change Metformin;Causes Diarrhea     HPI Jonathan [name] is a 39 year old male who presents for Follow Up today.   Patient Active Problem List   Diagnosis Date Noted  . Hypogonadotropic hypogonadism ([place]) 07/17/2019  . Hemoglobin A1C between 7% and 9% indicating borderline diabetic control 04/29/2019  . Decreased libido 04/29/2019  . Low testosterone in male 04/2019  . Non-pressure chronic ulcer of other part of left foot with necrosis of bone ([place])   . Cutaneous abscess of left foot   . Hemoglobin A1C greater than 9%, indicating poor diabetic control 08/27/2018  . Amputation of left great toe ([place]) 08/27/2018  . Osteomyelitis of great toe of left foot ([place]) 01/30/2018  . Osteomyelitis ([place]) 01/30/2018  . Depression with anxiety 01/30/2018  . Major depressive disorder, recurrent severe without psychotic features ([place]) 07/16/2017  . OD (overdose of drug), intentional self-harm, initial encounter ([place]) 07/15/2017  . Cellulitis 12/26/2016  . Open toe wound, subsequent encounter 12/12/2016  . Hyperglycemia 12/06/2016  . Diabetic ulcer of left foot ([place]) 12/05/2016  . Diabetic neuropathy ([place]) 10/16/2015  . Erectile dysfunction 08/05/2015  . Low testosterone 09/12/2014  . DM type 2, uncontrolled, with neuropathy ([place]) 06/12/2014  . Diabetes mellitus, type II ([place]) 03/23/2012  . Noncompliance 03/23/2012  . Gastroenteritis and colitis, viral 03/23/2012    Past Medical History:  Diagnosis Date  . Amputation of left great toe ([place]) 01/2019  . Anxiety   . Decreased libido 04/2019  . Diabetes mellitus   . Erectile dysfunction 04/2019  . Low testosterone in  male 04/2019  . Vitamin D deficiency 04/2019    Current Status: Since his last office visit, he is doing well with no complaints. He states that he has not been taking Diabetic medications X 1 month now. He no longer wants to take Metformin because of increased side effects of medication. His most recent normal range of preprandial blood glucose levels have been between 110-150. He has seen low range of 102  and high of 200 since his last office visit. Preprandial blood glucose levels are 220-250. He denies fatigue, frequent urination, blurred vision, excessive hunger, excessive thirst, weight gain, weight loss, and poor wound healing. He continues to check his feet regularly. He continues to follow up with Dr. Kelton Pillar, Endocrinologist for low testosterone levels. His libido remains the decreased today. She has scheduled him for MRI on 08/22/2019. His anxiety is stable today as Endocrinologist has plan for further assessment. He denies suicidal ideations, homicidal ideations, or auditory hallucinations. He denies fevers, chills, recent infections, weight loss, and night sweats. He has not had any headaches, dizziness, and falls. No chest pain, heart palpitations, cough and shortness of breath reported. Denies GI problems such as nausea, vomiting, diarrhea, and constipation. He has no reports of blood in stools, dysuria and hematuria. He is taking all medications as prescribed. He denies pain today.   Past Surgical History:  Procedure Laterality Date  . AMPUTATION Left 02/02/2018   Procedure: LEFT GREAT TOE AMPUTATION;  Surgeon: Newt Minion, MD;  Location: [facility];  Service: Orthopedics;  Laterality: Left;  . AMPUTATION Left  02/08/2019   Procedure: LEFT FOOT 1ST RAY AMPUTATION;  Surgeon: Newt Minion, MD;  Location: [facility];  Service: Orthopedics;  Laterality: Left;    Family History  Problem Relation Age of Onset  . Other Mother        Healthy  . Other Father        Healthy    Social History    Socioeconomic History  . Marital status: Single    Spouse name: Not on file  . Number of children: Not on file  . Years of education: Not on file  . Highest education level: Not on file  Occupational History  . Not on file  Tobacco Use  . Smoking status: Never Smoker  . Smokeless tobacco: Never Used  Vaping Use  . Vaping Use: Never used  Substance and Sexual Activity  . Alcohol use: Yes    Alcohol/week: 2.0 standard drinks    Types: 1 Glasses of wine, 1 Cans of beer per week    Comment: occassional  . Drug use: No  . Sexual activity: Yes  Other Topics Concern  . Not on file  Social History Narrative  . Not on file   Social Determinants of Health   Financial Resource Strain:   . Difficulty of Paying Living Expenses:   Food Insecurity:   . Worried About Charity fundraiser in the Last Year:   . Arboriculturist in the Last Year:   Transportation Needs:   . Film/video editor (Medical):   [name] Lack of Transportation (Non-Medical):   Physical Activity:   . Days of Exercise per Week:   . Minutes of Exercise per Session:   Stress:   . Feeling of Stress :   Social Connections:   . Frequency of Communication with Friends and Family:   . Frequency of Social Gatherings with Friends and Family:   . Attends Religious Services:   . Active Member of Clubs or Organizations:   . Attends Archivist Meetings:   [name] Marital Status:   Intimate Partner Violence:   . Fear of Current or Ex-Partner:   . Emotionally Abused:   [name] Physically Abused:   . Sexually Abused:     Outpatient Medications Prior to Visit  Medication Sig Dispense Refill  . atorvastatin (LIPITOR) 10 MG tablet Take 1 tablet (10 mg total) by mouth daily. 30 tablet 6  . blood glucose meter kit and supplies Dispense based on patient and insurance preference. Use up to four times daily as directed. (FOR ICD-10 E10.9, E11.9). 1 each 0  . Blood Glucose Monitoring Suppl (ONETOUCH VERIO) w/Device KIT Inject 1  Units into the skin QID. Use meter 4 times daily as directed 1 kit 0  . glucose blood (ONETOUCH VERIO) test strip Use as instructed 100 each 12  . glucose blood test strip Use test strips to check blood sugar four times daily as directed 100 each 3  . ibuprofen (ADVIL,MOTRIN) 800 MG tablet Take 1 tablet (800 mg total) by mouth every 8 (eight) hours as needed. (Patient taking differently: Take 800 mg by mouth every 8 (eight) hours as needed for moderate pain. ) 30 tablet 2  . Insulin Lispro Prot & Lispro (HUMALOG MIX 75/25 KWIKPEN) (75-25) 100 UNIT/ML Kwikpen INJECT 35 UNITS INTO THE SKIN 2 (TWO) TIMES DAILY. 21 mL 3  . Lancets (ONETOUCH ULTRASOFT) lancets Use 4 times daily as directed  E11.42, Z79.4 100 each 12  . metFORMIN (GLUCOPHAGE) 1000 MG tablet  Take 1 tablet (1,000 mg total) by mouth 2 (two) times daily. 60 tablet 0  . Vitamin D, Ergocalciferol, (DRISDOL) 1.25 MG (50000 UNIT) CAPS capsule Take 1 capsule (50,000 Units total) by mouth every 7 (seven) days. 5 capsule 6  . gabapentin (NEURONTIN) 300 MG capsule Take 1 capsule (300 mg total) by mouth at bedtime. (Patient not taking: Reported on 08/20/2019) 60 capsule 11  . sildenafil (VIAGRA) 100 MG tablet TAKE 1 TABLET (100 MG TOTAL) BY MOUTH DAILY AS NEEDED FOR ERECTILE DYSFUNCTION. (Patient not taking: Reported on 08/20/2019) 4 tablet 0   No facility-administered medications prior to visit.    No Known Allergies  ROS Review of Systems  Constitutional: Negative.   HENT: Negative.   Eyes: Negative.   Respiratory: Negative.   Cardiovascular: Negative.   Gastrointestinal: Negative.   Endocrine: Negative.   Genitourinary: Negative.   Musculoskeletal: Positive for arthralgias (occasional ).  Skin: Negative.   Allergic/Immunologic: Negative.   Neurological: Positive for dizziness (occasional ) and headaches (occasional ).  Hematological: Negative.   Psychiatric/Behavioral: Negative.       Objective:    Physical Exam Vitals and  nursing note reviewed.  Constitutional:      Appearance: Normal appearance.  HENT:     Head: Normocephalic and atraumatic.     Nose: Nose normal.     Mouth/Throat:     Mouth: Mucous membranes are moist.  Cardiovascular:     Rate and Rhythm: Normal rate and regular rhythm.     Pulses: Normal pulses.     Heart sounds: Normal heart sounds.  Pulmonary:     Effort: Pulmonary effort is normal.     Breath sounds: Normal breath sounds.  Abdominal:     General: Bowel sounds are normal.  Musculoskeletal:        General: Normal range of motion.     Cervical back: Normal range of motion and neck supple.  Skin:    General: Skin is warm and dry.  Neurological:     General: No focal deficit present.     Mental Status: He is alert and oriented to person, place, and time.  Psychiatric:        Mood and Affect: Mood normal.        Behavior: Behavior normal.        Thought Content: Thought content normal.        Judgment: Judgment normal.     BP (!) 141/81 (BP Location: Right Arm, Patient Position: Sitting, Cuff Size: Large)   Pulse 88   Temp (!) 97.1 F (36.2 C)   Ht '6\' 8"'  (2.032 m)   Wt 291 lb (132 kg)   SpO2 100%   BMI 31.97 kg/m  Wt Readings from Last 3 Encounters:  08/20/19 291 lb (132 kg)  07/15/19 286 lb (129.7 kg)  07/11/19 290 lb (131.5 kg)     Health Maintenance Due  Topic Date Due  . Hepatitis C Screening  Never done  . PNEUMOCOCCAL POLYSACCHARIDE VACCINE AGE 47-64 HIGH RISK  Never done  . COVID-19 Vaccine (1) Never done  . FOOT EXAM  10/15/2016    There are no preventive care reminders to display for this patient.  Lab Results  Component Value Date   TSH 1.74 07/15/2019   Lab Results  Component Value Date   WBC 3.9 (L) 07/15/2019   HGB 14.4 07/15/2019   HCT 44.2 07/15/2019   MCV 77.3 (L) 07/15/2019   PLT 150.0 07/15/2019   Lab Results  Component Value Date   NA 138 07/15/2019   K 4.3 07/15/2019   CO2 30 07/15/2019   GLUCOSE 192 (H) 07/15/2019   BUN  10 07/15/2019   CREATININE 0.88 07/15/2019   BILITOT 0.3 07/15/2019   ALKPHOS 84 07/15/2019   AST 30 07/15/2019   ALT 37 07/15/2019   PROT 7.1 07/15/2019   ALBUMIN 4.2 07/15/2019   CALCIUM 9.3 07/15/2019   ANIONGAP 11 03/10/2018   GFR 117.60 07/15/2019   Lab Results  Component Value Date   CHOL 107 04/29/2019   Lab Results  Component Value Date   HDL 45 04/29/2019   Lab Results  Component Value Date   LDLCALC 40 04/29/2019   Lab Results  Component Value Date   TRIG 122 04/29/2019   Lab Results  Component Value Date   CHOLHDL 2.4 04/29/2019   Lab Results  Component Value Date   HGBA1C 10.8 (A) 08/20/2019   HGBA1C 10.8 08/20/2019   HGBA1C 10.8 (A) 08/20/2019   HGBA1C 10.8 (A) 08/20/2019   Assessment & Plan:   1. Type 2 diabetes mellitus with complication, with long-term current use of insulin ([place]) He will continue medication as prescribed, to decrease foods/beverages high in sugars and carbs and follow Heart Healthy or DASH diet. Increase physical activity to at least 30 minutes cardio exercise daily.  - Urinalysis Dipstick - Glucose (CBG) - HgB A1c - insulin glargine (LANTUS SOLOSTAR) 100 UNIT/ML Solostar Pen; Inject 50 Units into the skin daily.  Dispense: 5 pen; Refill: PRN - liraglutide (VICTOZA) 18 MG/3ML SOPN; Start 0.9m SQ once a day for 7 days, then increase to 1.294monce a day  Dispense: 2 pen; Refill: prn  2. Hemoglobin A1C greater than 9%, indicating poor diabetic control Increased. Hgb A1c increased at 10.8 today, from 8.8 on 04/29/2019. Monitor. - insulin glargine (LANTUS SOLOSTAR) 100 UNIT/ML Solostar Pen; Inject 50 Units into the skin daily.  Dispense: 5 pen; Refill: PRN - liraglutide (VICTOZA) 18 MG/3ML SOPN; Start 0.3m68mQ once a day for 7 days, then increase to 1.2mg19mce a day  Dispense: 2 pen; Refill: prn  3. Hyperglycemia - insulin glargine (LANTUS SOLOSTAR) 100 UNIT/ML Solostar Pen; Inject 50 Units into the skin daily.  Dispense: 5 pen;  Refill: PRN - liraglutide (VICTOZA) 18 MG/3ML SOPN; Start 0.3mg 32monce a day for 7 days, then increase to 1.2mg o62m a day  Dispense: 2 pen; Refill: prn  4. Decreased libido  5. Low testosterone in male He will continue follow up with Endocrinology as needed.   6. Anxiety Stable.   7. Abnormal urinalysis Results are pending.  - Urine Culture  8. Follow up He will follow up in 1 month.   Meds ordered this encounter  Medications  . insulin glargine (LANTUS SOLOSTAR) 100 UNIT/ML Solostar Pen    Sig: Inject 50 Units into the skin daily.    Dispense:  5 pen    Refill:  PRN  . liraglutide (VICTOZA) 18 MG/3ML SOPN    Sig: Start 0.3mg SQ65mce a day for 7 days, then increase to 1.2mg onc45m day    Dispense:  2 pen    Refill:  prn    Orders Placed This Encounter  Procedures  . Urine Culture  . Urinalysis Dipstick  . Glucose (CBG)  . HgB A1c   Referral Orders  No referral(s) requested today   Dora Clauss Kathe BectonFNP-BC [facility]  [place], [place] [address] [phone]- fax   Problem List Items Addressed This Visit      Endocrine   Hemoglobin A1C greater than 9%, indicating poor diabetic control   Relevant Medications   insulin glargine (LANTUS SOLOSTAR) 100 UNIT/ML Solostar Pen   liraglutide (VICTOZA) 18 MG/3ML SOPN     Other   Decreased libido   Hyperglycemia   Relevant Medications   insulin glargine (LANTUS SOLOSTAR) 100 UNIT/ML Solostar Pen   liraglutide (VICTOZA) 18 MG/3ML SOPN   Low testosterone in male    Other Visit Diagnoses    Type 2 diabetes mellitus with complication, with long-term current use of insulin ([facility])    -  Primary   Relevant Medications   insulin glargine (LANTUS SOLOSTAR) 100 UNIT/ML Solostar Pen   liraglutide (VICTOZA) 18 MG/3ML SOPN   Other Relevant Orders   Urinalysis Dipstick (Completed)   Glucose (CBG)  (Completed)   HgB A1c (Completed)   Anxiety       Abnormal urinalysis       Relevant Orders   Urine Culture   Follow up          Meds ordered this encounter  Medications  . insulin glargine (LANTUS SOLOSTAR) 100 UNIT/ML Solostar Pen    Sig: Inject 50 Units into the skin daily.    Dispense:  5 pen    Refill:  PRN  . liraglutide (VICTOZA) 18 MG/3ML SOPN    Sig: Start 0.19m SQ once a day for 7 days, then increase to 1.264monce a day    Dispense:  2 pen    Refill:  prn    Follow-up: Return in about 1 day (around 08/21/2019).    NaAzzie GlatterFNP

## 2019-08-22 ENCOUNTER — Ambulatory Visit
Admission: RE | Admit: 2019-08-22 | Discharge: 2019-08-22 | Disposition: A | Discharge status: Home / Self Care | Payer: 59 | Source: Ambulatory Visit | Attending: Internal Medicine | Admitting: Internal Medicine

## 2019-08-22 DIAGNOSIS — E230 Hypopituitarism: Secondary | ICD-10-CM

## 2019-08-22 LAB — URINE CULTURE

## 2019-08-22 MED ORDER — GADOBENATE DIMEGLUMINE 529 MG/ML IV SOLN
10.0000 mL | Freq: Once | INTRAVENOUS | Status: AC | PRN
  Administered 2019-08-22: 10 mL via INTRAVENOUS

## 2019-08-27 ENCOUNTER — Encounter: Payer: Self-pay | Admitting: Internal Medicine

## 2019-08-28 MED ORDER — CLOMIPHENE CITRATE 50 MG PO TABS: 50.0000 mg | ORAL_TABLET | Freq: Every day | ORAL | 4 refills | Status: DC

## 2019-08-30 ENCOUNTER — Telehealth: Payer: Self-pay

## 2019-08-30 NOTE — Telephone Encounter (Signed)
PA for clomiphene on cover my meds for [facility]

## 2019-09-09 ENCOUNTER — Other Ambulatory Visit: Payer: Self-pay

## 2019-09-09 MED ORDER — CLOMIPHENE CITRATE 50 MG PO TABS: 50.0000 mg | ORAL_TABLET | Freq: Every day | ORAL | 0 refills | Status: DC

## 2019-09-18 ENCOUNTER — Ambulatory Visit: Payer: Self-pay | Admitting: Family Medicine

## 2019-09-29 DIAGNOSIS — R739 Hyperglycemia, unspecified: Secondary | ICD-10-CM

## 2019-09-29 HISTORY — DX: Hyperglycemia, unspecified: R73.9

## 2019-10-16 ENCOUNTER — Encounter: Payer: Self-pay | Admitting: Family Medicine

## 2019-10-16 ENCOUNTER — Other Ambulatory Visit: Payer: Self-pay

## 2019-10-16 ENCOUNTER — Ambulatory Visit (INDEPENDENT_AMBULATORY_CARE_PROVIDER_SITE_OTHER): Payer: 59 | Admitting: Family Medicine

## 2019-10-16 VITALS — BP 120/74 | HR 100 | Temp 97.8°F | Resp 18 | Ht >= 80 in | Wt 279.0 lb

## 2019-10-16 DIAGNOSIS — E118 Type 2 diabetes mellitus with unspecified complications: Secondary | ICD-10-CM

## 2019-10-16 DIAGNOSIS — R7989 Other specified abnormal findings of blood chemistry: Secondary | ICD-10-CM

## 2019-10-16 DIAGNOSIS — Z794 Long term (current) use of insulin: Secondary | ICD-10-CM

## 2019-10-16 DIAGNOSIS — Z09 Encounter for follow-up examination after completed treatment for conditions other than malignant neoplasm: Secondary | ICD-10-CM

## 2019-10-16 DIAGNOSIS — R7309 Other abnormal glucose: Secondary | ICD-10-CM | POA: Diagnosis not present

## 2019-10-16 DIAGNOSIS — R634 Abnormal weight loss: Secondary | ICD-10-CM

## 2019-10-16 DIAGNOSIS — R739 Hyperglycemia, unspecified: Secondary | ICD-10-CM

## 2019-10-16 DIAGNOSIS — E1149 Type 2 diabetes mellitus with other diabetic neurological complication: Secondary | ICD-10-CM | POA: Diagnosis not present

## 2019-10-16 DIAGNOSIS — F419 Anxiety disorder, unspecified: Secondary | ICD-10-CM

## 2019-10-16 LAB — POCT URINALYSIS DIPSTICK
Bilirubin, UA: NEGATIVE
Glucose, UA: NEGATIVE
Ketones, UA: NEGATIVE
Leukocytes, UA: NEGATIVE
Nitrite, UA: NEGATIVE
Protein, UA: POSITIVE — AB
Spec Grav, UA: >=1.03 — AB (ref 1.010–1.025)
Urobilinogen, UA: 0.2 E.U./dL
pH, UA: 5.5 (ref 5.0–8.0)

## 2019-10-16 LAB — GLUCOSE, POCT (MANUAL RESULT ENTRY): POC Glucose: 279 mg/dl — AB (ref 70–99)

## 2019-10-16 MED ORDER — GABAPENTIN 300 MG PO CAPS: 300.0000 mg | ORAL_CAPSULE | Freq: Every day | ORAL | 11 refills | Status: DC

## 2019-10-16 MED ORDER — INSULIN LISPRO PROT & LISPRO (75-25 MIX) 100 UNIT/ML KWIKPEN: PEN_INJECTOR | SUBCUTANEOUS | 11 refills | Status: DC

## 2019-10-16 NOTE — Progress Notes (Signed)
Patient Jonathan [name]    Established Patient Office Visit  Subjective:  Patient ID: Rodman Recupero, male    DOB: 20-Feb-1981  Age: 39 y.o. MRN: [id number]  CC:  Chief Complaint  Patient presents with  . Follow-up    Pt states he is here for his f/u. Pt states he doesn't really have any question or concerns at the moment.    HPI Eulis Salazar is a 39 year old who presents for Follow Up today.    Patient Active Problem List   Diagnosis Date Noted  . Hypogonadotropic hypogonadism ([place]) 07/17/2019  . Hemoglobin A1C between 7% and 9% indicating borderline diabetic control 04/29/2019  . Decreased libido 04/29/2019  . Low testosterone in male 04/2019  . Non-pressure chronic ulcer of other part of left foot with necrosis of bone ([place])   . Cutaneous abscess of left foot   . Hemoglobin A1C greater than 9%, indicating poor diabetic control 08/27/2018  . Amputation of left great toe ([place]) 08/27/2018  . Osteomyelitis of great toe of left foot ([place]) 01/30/2018  . Osteomyelitis ([place]) 01/30/2018  . Depression with anxiety 01/30/2018  . Major depressive disorder, recurrent severe without psychotic features ([place]) 07/16/2017  . OD (overdose of drug), intentional self-harm, initial encounter ([place]) 07/15/2017  . Cellulitis 12/26/2016  . Open toe wound, subsequent encounter 12/12/2016  . Hyperglycemia 12/06/2016  . Diabetic ulcer of left foot ([place]) 12/05/2016  . Diabetic neuropathy ([place]) 10/16/2015  . Erectile dysfunction 08/05/2015  . Low testosterone 09/12/2014  . DM type 2, uncontrolled, with neuropathy ([place]) 06/12/2014  . Diabetes mellitus, type II ([place]) 03/23/2012  . Noncompliance 03/23/2012  . Gastroenteritis and colitis, viral 03/23/2012    Past Medical History:  Diagnosis Date  . Amputation of left great toe ([place]) 01/2019  . Anxiety   . Decreased libido 04/2019  . Diabetes mellitus   . Erectile dysfunction 04/2019  . Hyperglycemia  09/2019  . Low testosterone in male 04/2019  . Vitamin D deficiency 04/2019    Past Surgical History:  Procedure Laterality Date  . AMPUTATION Left 02/02/2018   Procedure: LEFT GREAT TOE AMPUTATION;  Surgeon: Newt Minion, MD;  Location: [facility];  Service: Orthopedics;  Laterality: Left;  . AMPUTATION Left 02/08/2019   Procedure: LEFT FOOT 1ST RAY AMPUTATION;  Surgeon: Newt Minion, MD;  Location: [facility];  Service: Orthopedics;  Laterality: Left;    Current Status: Since his last office visit, he is doing well with no complaints. He is currently not injecting Humalog as prescribed. The most recent normal range of preprandial blood glucose levels have been between 110-130. He has seen low range of 49, which he ate candy to increase blood glucose level; and high of 170 since his last office visit. He denies fatigue, frequent urination, blurred vision, excessive hunger, excessive thirst, weight gain, weight loss, and poor wound healing. Hecontinues to check her feet regularly. His anxiety is stable  today. He denies suicidal ideations, homicidal ideations, or auditory hallucinations. He continues to follow up with Endocrinology for testosterone issues. He is currently being treated with Clomiphene (Clomid) at this time, which he states is working well. He states that his energy level has increased. He has a follow up appointment with her on 10/18/2019. He denies fevers, chills, recent infections, weight loss, and night sweats. He has not had any headaches, dizziness, and falls. No chest pain, heart palpitations, cough and shortness of breath reported. Denies GI problems such as  nausea, vomiting, diarrhea, and constipation. Hehas no reports of blood in stools, dysuria and hematuria. He has not been taking Humalog as prescribed. He denies pain today.   Family History  Problem Relation Age of Onset  . Other Mother        Healthy  . Other Father        Healthy    Social History   Socioeconomic  History  . Marital status: Single    Spouse name: Not on file  . Number of children: Not on file  . Years of education: Not on file  . Highest education level: Not on file  Occupational History  . Not on file  Tobacco Use  . Smoking status: Never Smoker  . Smokeless tobacco: Never Used  Vaping Use  . Vaping Use: Never used  Substance and Sexual Activity  . Alcohol use: Yes    Alcohol/week: 2.0 standard drinks    Types: 1 Glasses of wine, 1 Cans of beer per week    Comment: occassional  . Drug use: No  . Sexual activity: Yes  Other Topics Concern  . Not on file  Social History Narrative  . Not on file   Social Determinants of Health   Financial Resource Strain:   . Difficulty of Paying Living Expenses: Not on file  Food Insecurity:   . Worried About Charity fundraiser in the Last Year: Not on file  . Ran Out of Food in the Last Year: Not on file  Transportation Needs:   . Lack of Transportation (Medical): Not on file  . Lack of Transportation (Non-Medical): Not on file  Physical Activity:   . Days of Exercise per Week: Not on file  . Minutes of Exercise per Session: Not on file  Stress:   . Feeling of Stress : Not on file  Social Connections:   . Frequency of Communication with Friends and Family: Not on file  . Frequency of Social Gatherings with Friends and Family: Not on file  . Attends Religious Services: Not on file  . Active Member of Clubs or Organizations: Not on file  . Attends Archivist Meetings: Not on file  . Marital Status: Not on file  Intimate Partner Violence:   . Fear of Current or Ex-Partner: Not on file  . Emotionally Abused: Not on file  . Physically Abused: Not on file  . Sexually Abused: Not on file    Outpatient Medications Prior to Visit  Medication Sig Dispense Refill  . atorvastatin (LIPITOR) 10 MG tablet Take 1 tablet (10 mg total) by mouth daily. 30 tablet 6  . blood glucose meter kit and supplies Dispense based on  patient and insurance preference. Use up to four times daily as directed. (FOR ICD-10 E10.9, E11.9). 1 each 0  . Blood Glucose Monitoring Suppl (ONETOUCH VERIO) w/Device KIT Inject 1 Units into the skin QID. Use meter 4 times daily as directed 1 kit 0  . clomiPHENE (CLOMID) 50 MG tablet Take 1 tablet (50 mg total) by mouth daily. 90 tablet 0  . glucose blood (ONETOUCH VERIO) test strip Use as instructed 100 each 12  . glucose blood test strip Use test strips to check blood sugar four times daily as directed 100 each 3  . insulin glargine (LANTUS SOLOSTAR) 100 UNIT/ML Solostar Pen Inject 50 Units into the skin daily. 5 pen PRN  . Lancets (ONETOUCH ULTRASOFT) lancets Use 4 times daily as directed  E11.42, Z79.4 100 each 12  .  liraglutide (VICTOZA) 18 MG/3ML SOPN Start 0.47m SQ once a day for 7 days, then increase to 1.216monce a day 2 pen prn  . sildenafil (VIAGRA) 100 MG tablet TAKE 1 TABLET (100 MG TOTAL) BY MOUTH DAILY AS NEEDED FOR ERECTILE DYSFUNCTION. 4 tablet 0  . Vitamin D, Ergocalciferol, (DRISDOL) 1.25 MG (50000 UNIT) CAPS capsule Take 1 capsule (50,000 Units total) by mouth every 7 (seven) days. 5 capsule 6  . Insulin Lispro Prot & Lispro (HUMALOG MIX 75/25 KWIKPEN) (75-25) 100 UNIT/ML Kwikpen INJECT 35 UNITS INTO THE SKIN 2 (TWO) TIMES DAILY. 21 mL 3  . ibuprofen (ADVIL,MOTRIN) 800 MG tablet Take 1 tablet (800 mg total) by mouth every 8 (eight) hours as needed. (Patient not taking: Reported on 10/16/2019) 30 tablet 2  . metFORMIN (GLUCOPHAGE) 1000 MG tablet Take 1 tablet (1,000 mg total) by mouth 2 (two) times daily. (Patient not taking: Reported on 10/16/2019) 60 tablet 0  . gabapentin (NEURONTIN) 300 MG capsule Take 1 capsule (300 mg total) by mouth at bedtime. (Patient not taking: Reported on 08/20/2019) 60 capsule 11   No facility-administered medications prior to visit.    No Known Allergies  ROS Review of Systems  Constitutional: Negative.   HENT: Negative.   Eyes: Negative.     Respiratory: Negative.   Gastrointestinal: Negative.   Endocrine: Positive for polyuria.  Genitourinary: Negative.   Musculoskeletal: Negative.   Skin: Negative.   Allergic/Immunologic: Negative.   Neurological: Positive for dizziness (occasional ) and headaches (occasional ).  Hematological: Negative.   Psychiatric/Behavioral: Negative.       Objective:    Physical Exam Vitals and nursing note reviewed.  Constitutional:      Appearance: Normal appearance.  HENT:     Head: Normocephalic and atraumatic.     Nose: Nose normal.     Mouth/Throat:     Mouth: Mucous membranes are moist.     Pharynx: Oropharynx is clear.  Cardiovascular:     Rate and Rhythm: Normal rate and regular rhythm.     Pulses: Normal pulses.     Heart sounds: Normal heart sounds.  Pulmonary:     Effort: Pulmonary effort is normal.     Breath sounds: Normal breath sounds.  Abdominal:     General: Bowel sounds are normal.     Palpations: Abdomen is soft.  Musculoskeletal:     Cervical back: Normal range of motion and neck supple.  Neurological:     Mental Status: He is alert.     BP 120/74 (BP Location: Left Arm, Patient Position: Sitting, Cuff Size: Large)   Pulse 100   Temp 97.8 F (36.6 C)   Resp 18   Ht '6\' 8"'  (2.032 m)   Wt 279 lb (126.6 kg)   SpO2 100%   BMI 30.65 kg/m  Wt Readings from Last 3 Encounters:  10/16/19 279 lb (126.6 kg)  08/20/19 291 lb (132 kg)  07/15/19 286 lb (129.7 kg)     Health Maintenance Due  Topic Date Due  . Hepatitis C Screening  Never done  . PNEUMOCOCCAL POLYSACCHARIDE VACCINE AGE 60-64 HIGH RISK  Never done  . COVID-19 Vaccine (1) Never done  . FOOT EXAM  10/15/2016  . INFLUENZA VACCINE  09/29/2019    There are no preventive [name] reminders to display for this patient.  Lab Results  Component Value Date   TSH 1.74 07/15/2019   Lab Results  Component Value Date   WBC 3.9 (L) 07/15/2019   HGB  14.4 07/15/2019   HCT 44.2 07/15/2019   MCV 77.3  (L) 07/15/2019   PLT 150.0 07/15/2019   Lab Results  Component Value Date   NA 138 07/15/2019   K 4.3 07/15/2019   CO2 30 07/15/2019   GLUCOSE 192 (H) 07/15/2019   BUN 10 07/15/2019   CREATININE 0.88 07/15/2019   BILITOT 0.3 07/15/2019   ALKPHOS 84 07/15/2019   AST 30 07/15/2019   ALT 37 07/15/2019   PROT 7.1 07/15/2019   ALBUMIN 4.2 07/15/2019   CALCIUM 9.3 07/15/2019   ANIONGAP 11 03/10/2018   GFR 117.60 07/15/2019   Lab Results  Component Value Date   CHOL 107 04/29/2019   Lab Results  Component Value Date   HDL 45 04/29/2019   Lab Results  Component Value Date   LDLCALC 40 04/29/2019   Lab Results  Component Value Date   TRIG 122 04/29/2019   Lab Results  Component Value Date   CHOLHDL 2.4 04/29/2019   Lab Results  Component Value Date   HGBA1C 10.8 (A) 08/20/2019   HGBA1C 10.8 08/20/2019   HGBA1C 10.8 (A) 08/20/2019   HGBA1C 10.8 (A) 08/20/2019      Assessment & Plan:   1. Type 2 diabetes mellitus with complication, with long-term current use of insulin ([place]) He will begin Humalog mix. He will continue medication as prescribed, to decrease foods/beverages high in sugars and carbs and follow Heart Healthy or DASH diet. Increase physical activity to at least 30 minutes cardio exercise daily.  - Urinalysis Dipstick - POC Glucose (CBG) - Insulin Lispro Prot & Lispro (HUMALOG MIX 75/25 KWIKPEN) (75-25) 100 UNIT/ML Kwikpen; INJECT 35 UNITS INTO THE SKIN 2 (TWO) TIMES DAILY.  Dispense: 20 mL; Refill: 11  2. Other diabetic neurological complication associated with type 2 diabetes mellitus (HCC) Stable.  - gabapentin (NEURONTIN) 300 MG capsule; Take 1 capsule (300 mg total) by mouth at bedtime.  Dispense: 30 capsule; Refill: 11  3. Hemoglobin A1C greater than 9%, indicating poor diabetic control Most recent Hgb A1c at 10.8. Monitor.  - Insulin Lispro Prot & Lispro (HUMALOG MIX 75/25 KWIKPEN) (75-25) 100 UNIT/ML Kwikpen; INJECT 35 UNITS INTO THE SKIN 2 (TWO)  TIMES DAILY.  Dispense: 20 mL; Refill: 11  4. Weight loss He has had an 11 lb weight loss in 3 months.   5. Low testosterone in male Continue to follow up with Endocrinologist as needed.   6. Anxiety Stable.   7. Hyperglycemia  8. Follow up He will follow up in 1 month.   Meds ordered this encounter  Medications  . gabapentin (NEURONTIN) 300 MG capsule    Sig: Take 1 capsule (300 mg total) by mouth at bedtime.    Dispense:  30 capsule    Refill:  11  . Insulin Lispro Prot & Lispro (HUMALOG MIX 75/25 KWIKPEN) (75-25) 100 UNIT/ML Kwikpen    Sig: INJECT 35 UNITS INTO THE SKIN 2 (TWO) TIMES DAILY.    Dispense:  20 mL    Refill:  11    Orders Placed This Encounter  Procedures  . Urinalysis Dipstick  . POC Glucose (CBG)    Referral Orders  No referral(s) requested today    Kathe Becton,  MSN, FNP-BC [facility] [address] [place], [place] [address] [phone] fax  Problem List Items Addressed This Visit      Endocrine   Diabetic neuropathy [facility])   Relevant Medications  gabapentin (NEURONTIN) 300 MG capsule   Insulin Lispro Prot & Lispro (HUMALOG MIX 75/25 KWIKPEN) (75-25) 100 UNIT/ML Kwikpen   Hemoglobin A1C greater than 9%, indicating poor diabetic control   Relevant Medications   Insulin Lispro Prot & Lispro (HUMALOG MIX 75/25 KWIKPEN) (75-25) 100 UNIT/ML Kwikpen     Other   Hyperglycemia   Low testosterone in male    Other Visit Diagnoses    Type 2 diabetes mellitus with complication, with long-term current use of insulin (HCC)    -  Primary   Relevant Medications   Insulin Lispro Prot & Lispro (HUMALOG MIX 75/25 KWIKPEN) (75-25) 100 UNIT/ML Kwikpen   Other Relevant Orders   Urinalysis Dipstick (Completed)   POC Glucose (CBG) (Completed)   Weight loss       Anxiety       Follow up          Meds ordered this encounter  Medications  .  gabapentin (NEURONTIN) 300 MG capsule    Sig: Take 1 capsule (300 mg total) by mouth at bedtime.    Dispense:  30 capsule    Refill:  11  . Insulin Lispro Prot & Lispro (HUMALOG MIX 75/25 KWIKPEN) (75-25) 100 UNIT/ML Kwikpen    Sig: INJECT 35 UNITS INTO THE SKIN 2 (TWO) TIMES DAILY.    Dispense:  20 mL    Refill:  11    Follow-up: Return in about 1 month (around 11/16/2019).    Azzie Glatter, FNP

## 2019-10-16 NOTE — Progress Notes (Signed)
poc

## 2019-10-17 ENCOUNTER — Encounter: Payer: Self-pay | Admitting: Family Medicine

## 2019-10-25 ENCOUNTER — Ambulatory Visit (INDEPENDENT_AMBULATORY_CARE_PROVIDER_SITE_OTHER): Payer: 59 | Admitting: Internal Medicine

## 2019-10-25 ENCOUNTER — Encounter: Payer: Self-pay | Admitting: Internal Medicine

## 2019-10-25 ENCOUNTER — Other Ambulatory Visit: Payer: Self-pay

## 2019-10-25 ENCOUNTER — Telehealth: Payer: Self-pay | Admitting: Internal Medicine

## 2019-10-25 VITALS — BP 140/84 | HR 86 | Ht >= 80 in | Wt 286.4 lb

## 2019-10-25 DIAGNOSIS — R7989 Other specified abnormal findings of blood chemistry: Secondary | ICD-10-CM

## 2019-10-25 DIAGNOSIS — E230 Hypopituitarism: Secondary | ICD-10-CM

## 2019-10-25 LAB — CORTISOL: Cortisol, Plasma: 10.3 ug/dL

## 2019-10-25 LAB — POCT GLUCOSE (DEVICE FOR HOME USE): POC Glucose: 131 mg/dl — AB (ref 70–99)

## 2019-10-25 LAB — LUTEINIZING HORMONE: LH: 16.64 m[IU]/mL — ABNORMAL HIGH (ref 1.50–9.30)

## 2019-10-25 LAB — FOLLICLE STIMULATING HORMONE: FSH: 14.3 m[IU]/mL (ref 1.4–18.1)

## 2019-10-25 NOTE — Progress Notes (Signed)
Name: Jonathan [name]  MRN/ DOB: [id number], 1981-02-26    Age/ Sex: 39 y.o., male     PCP: Azzie Glatter, FNP   Reason for Endocrinology Evaluation: Hypogonadism      Initial Endocrinology Clinic Visit: 07/15/2019    PATIENT IDENTIFIER: Jonathan [name] is a 39 y.o., male with a past medical history of T2DM, and Hypogonadism. He has followed with [facility] since 07/15/2019 for consultative assistance with management of his Hypogonadism .   HISTORICAL SUMMARY: The patient was first diagnosed with   hypogonadism in 2016. He has had multiple testosterone readings with a nadir of 125 ng/dL, as well as low sex hormone binding globulin. He was on Testosterone Cypionate at some point.   No hx of long term narcotic or illicit drug use.  No hx of head trauma  No of testicular childhood infection that he could recall.  No FH of prostate cancer    Testicular size on initial presentation 20 mL bilaterally, with a total testosterone of 167 ng/dL, inappropriately normal FSH and LH 3.4 and 7.54 mIU/mL respectively. Normal prolactin, TFT and cortisol.   MRI of brain did NOT show any pituitary pathology (08/22/2019)   He was prescribed Clomiphene 50 mg daily which was started 08/2019.    SUBJECTIVE:     Today (10/25/2019):  Jonathan [name] is here for a follow up on hypogonadotropic hypogonadism    Pt has noted dry ejaculation for the past 2 years Pt with ED, has been using Viagra but not strong erections Has normal libido   He denies any side effects of clomiphene. Has noted improvement in energy levels.   Has been taking it for ~ 5 weeks   ROS:  As per HPI.   HISTORY:  Past Medical History:  Past Medical History:  Diagnosis Date   Amputation of left great toe ([place]) 01/2019   Anxiety    Decreased libido 04/2019   Diabetes mellitus    Erectile dysfunction 04/2019   Hyperglycemia 09/2019   Low testosterone in male 04/2019   Vitamin D deficiency  04/2019    Past Surgical History:  Past Surgical History:  Procedure Laterality Date   AMPUTATION Left 02/02/2018   Procedure: LEFT GREAT TOE AMPUTATION;  Surgeon: Newt Minion, MD;  Location: [facility];  Service: Orthopedics;  Laterality: Left;   AMPUTATION Left 02/08/2019   Procedure: LEFT FOOT 1ST RAY AMPUTATION;  Surgeon: Newt Minion, MD;  Location: [facility];  Service: Orthopedics;  Laterality: Left;     Social History:  reports that he has never smoked. He has never used smokeless tobacco. He reports current alcohol use of about 2.0 standard drinks of alcohol per week. He reports that he does not use drugs. Family History:  Family History  Problem Relation Age of Onset   Other Mother        Healthy   Other Father        Healthy      HOME MEDICATIONS: Allergies as of 10/25/2019   No Known Allergies     Medication List       Accurate as of October 25, 2019  7:28 AM. If you have any questions, ask your nurse or doctor.        atorvastatin 10 MG tablet Commonly known as: LIPITOR Take 1 tablet (10 mg total) by mouth daily.   blood glucose meter kit and supplies Dispense based on patient and insurance preference. Use up to four times daily as directed. (  FOR ICD-10 E10.9, E11.9).   clomiPHENE 50 MG tablet Commonly known as: CLOMID Take 1 tablet (50 mg total) by mouth daily.   gabapentin 300 MG capsule Commonly known as: NEURONTIN Take 1 capsule (300 mg total) by mouth at bedtime.   glucose blood test strip Use test strips to check blood sugar four times daily as directed   OneTouch Verio test strip Generic drug: glucose blood Use as instructed   ibuprofen 800 MG tablet Commonly known as: ADVIL Take 1 tablet (800 mg total) by mouth every 8 (eight) hours as needed.   Insulin Lispro Prot & Lispro (75-25) 100 UNIT/ML Kwikpen Commonly known as: HumaLOG Mix 75/25 KwikPen INJECT 35 UNITS INTO THE SKIN 2 (TWO) TIMES DAILY.   Lantus SoloStar 100 UNIT/ML Solostar  Pen Generic drug: insulin glargine Inject 50 Units into the skin daily.   metFORMIN 1000 MG tablet Commonly known as: GLUCOPHAGE Take 1 tablet (1,000 mg total) by mouth 2 (two) times daily.   onetouch ultrasoft lancets Use 4 times daily as directed  E11.42, Z79.4   OneTouch Verio w/Device Kit Inject 1 Units into the skin QID. Use meter 4 times daily as directed   sildenafil 100 MG tablet Commonly known as: VIAGRA TAKE 1 TABLET (100 MG TOTAL) BY MOUTH DAILY AS NEEDED FOR ERECTILE DYSFUNCTION.   Victoza 18 MG/3ML Sopn Generic drug: liraglutide Start 0.14m SQ once a day for 7 days, then increase to 1.233monce a day   Vitamin D (Ergocalciferol) 1.25 MG (50000 UNIT) Caps capsule Commonly known as: DRISDOL Take 1 capsule (50,000 Units total) by mouth every 7 (seven) days.         OBJECTIVE:   PHYSICAL EXAM: VS: BP 140/84 (BP Location: Left Arm, Patient Position: Sitting, Cuff Size: Normal)    Pulse 86    Ht '6\' 8"'  (2.032 m)    Wt 286 lb 6.4 oz (129.9 kg)    SpO2 97%    BMI 31.46 kg/m    EXAM: General: Pt appears well and is in NAD  Neck: General: Supple without adenopathy. Thyroid: Thyroid size normal.  No goiter or nodules appreciated. No thyroid bruit.  Lungs: Clear with good BS bilat with no rales, rhonchi, or wheezes  Heart: Auscultation: RRR.  Abdomen: Normoactive bowel sounds, soft, nontender, without masses or organomegaly palpable  Extremities:  BL LE: No pretibial edema normal ROM and strength.  Mental Status: Judgment, insight: Intact Orientation: Oriented to time, place, and person Mood and affect: No depression, anxiety, or agitation     DATA REVIEWED: Results for TICURLEY, HOGENMRN [id number] of 10/27/2019 13:09  Ref. Range 10/25/2019 08:26  Cortisol, Plasma Latest Units: ug/dL 10.3  LH Latest Ref Range: 1.50 - 9.30 mIU/mL 16.64 (H)  FSH Latest Ref Range: 1.4 - 18.1 mIU/ML 14.3    MRI Brain 08/22/2019   Brain: Dynamic pituitary protocol. The  sella is shallow. The pituitary is normal in height 5.4 mm. The pituitary enhances homogeneously. No microadenoma or macro adenoma. Infundibulum midline. Normal cavernous sinus and optic chiasm.  Ventricle size normal. Cerebral volume normal. Negative for acute infarct hemorrhage or mass. Few small white matter hyperintensities bilaterally appear chronic.  Vascular: Normal arterial flow voids  Skull and upper cervical spine: Normal bone marrow.  Sinuses/Orbits: Mild mucosal edema paranasal sinuses.  Normal orbit  Other: None  IMPRESSION: Negative for pituitary lesion. The sella is shallow however the pituitary is normal in size and enhances homogeneously.  No acute intracranial abnormality. Few small white  matter hyperintensities bilaterally may reflect chronic microvascular ischemia. Correlate with risk factors for small vessel disease.  ASSESSMENT / PLAN / RECOMMENDATIONS:   1. Hypogonadotrophic Hypogonadism :  - Unknown cause, as he has no hx of head injury, or chronic narcotic or marijuana use.  - Of note the pt was on testosterone therapy at some point in the past in the form of injectables. Unclear if this is the cause of his secondary hypogonadism  - Due to his intention to have children in the future, we have started him on clomiphene. He is tolerating it good. LH and FSH responding good - Testosterone pending.     Medications   Continue Clomiphene 50 mg daily     F/U in 3 months  Labs in 6 weeks   Signed electronically by: Mack Guise, MD  [facility]  [facility] [address] [place], [place] [address] Phone: [phone] FAX: [phone]      CC: Azzie Glatter, [place] [address] Phone: [phone]  Fax: [phone]   Return to Endocrinology clinic as below: Future Appointments  Date Time Provider [place]  10/25/2019  7:50 AM Alvetta Hidrogo, Melanie Crazier, MD [facility] None  12/02/2019 11:00 AM Azzie Glatter, FNP [facility] None

## 2019-10-25 NOTE — Telephone Encounter (Signed)
PA has been initiated via CoverMyMeds.com for Clomephine 50mg  tablets with diagnosis of Hypogonadic Hypogonadism with request of expedited review.  Key   PA Case ID: Dahlia Bailiff  Status: Sent to [place] Drug: clomiPHENE Citrate 50MG  tablets Form: [employer] Electronic Prior Authorization Form (2017 NCPDP)  [employer] is reviewing your PA request. Typically an electronic response will be received within 72 hours. To check for an update later, open this request from your dashboard.  You may close this dialog and return to your dashboard to perform other tasks.

## 2019-10-25 NOTE — Patient Instructions (Signed)
-   Continue Clomiphene 50 mg daily

## 2019-10-25 NOTE — Telephone Encounter (Signed)
Anette Riedel,   Mr. Sainz has hypogonadotrophic hypogonadism and is on Clomiphene but his insurance is not paying for it.   Can we please start a PA for him?   Thanks

## 2019-10-27 ENCOUNTER — Encounter: Payer: Self-pay | Admitting: Internal Medicine

## 2019-10-28 LAB — TESTOSTERONE, TOTAL, LC/MS/MS: Testosterone, Total, LC-MS-MS: 331 ng/dL (ref 250–1100)

## 2019-10-28 LAB — INSULIN-LIKE GROWTH FACTOR
IGF-I, LC/MS: 105 ng/mL (ref 53–331)
Z-Score (Male): -0.6 SD (ref ?–2.0)

## 2019-10-28 LAB — ACTH: C206 ACTH: 36 pg/mL (ref 6–50)

## 2019-10-28 NOTE — Telephone Encounter (Signed)
Received fax from pt's insurance stating that this PA has been canceled because "the requested drug is not managed by the [employer] Prior Authorizations department. Please contact [facility] at [phone]." I then contacted this department and spoke with rep named Criss Rosales. She initiatlly stated that the patient must be seen by a provider that is working in a center of Furniture conservator/restorer.  It was explained to [place] that this patient is not taking this medication for a fertility diagnosis, as the initial PA stated.  After searching, Aurelio Jew indicated that the appeals dept needed to be contacted. I was then transferred to this dept and spoke with rep named Marla.  Leia Alf indicated that because this medication is typically used in treating fertility issues, it was cancelled, but since the patient is a male and not using this medication for a fertility issue, it would need to go to a different dept within appeals to be discussed. Rep named Alycia Rossetti then came on the line and after further investigation with him, it was deteremined that an appeal could be completed and it can be faxed to [phone]. The appeal must include the following: - Claim number -Name of patient -ID number on insurance card -Date of medical service -MD name -Reason as to why the claim should be paid -Most recent chart notes  Reference number is [id number]

## 2019-11-29 ENCOUNTER — Other Ambulatory Visit (INDEPENDENT_AMBULATORY_CARE_PROVIDER_SITE_OTHER): Payer: 59

## 2019-11-29 ENCOUNTER — Encounter: Payer: Self-pay | Admitting: Internal Medicine

## 2019-11-29 ENCOUNTER — Other Ambulatory Visit: Payer: Self-pay

## 2019-11-29 DIAGNOSIS — R7989 Other specified abnormal findings of blood chemistry: Secondary | ICD-10-CM

## 2019-11-29 LAB — COMPREHENSIVE METABOLIC PANEL
ALT: 29 U/L (ref 0–53)
AST: 22 U/L (ref 0–37)
Albumin: 4 g/dL (ref 3.5–5.2)
Alkaline Phosphatase: 55 U/L (ref 39–117)
BUN: 12 mg/dL (ref 6–23)
CO2: 28 mEq/L (ref 19–32)
Calcium: 9.2 mg/dL (ref 8.4–10.5)
Chloride: 105 mEq/L (ref 96–112)
Creatinine, Ser: 1.05 mg/dL (ref 0.40–1.50)
GFR: 95.72 mL/min (ref >60.00)
Glucose, Bld: 249 mg/dL — ABNORMAL HIGH (ref 70–99)
Potassium: 4.4 mEq/L (ref 3.5–5.1)
Sodium: 138 mEq/L (ref 135–145)
Total Bilirubin: 0.3 mg/dL (ref 0.2–1.2)
Total Protein: 7 g/dL (ref 6.0–8.3)

## 2019-11-29 LAB — CBC WITH DIFFERENTIAL/PLATELET
Basophils Absolute: 0 10*3/uL (ref 0.0–0.1)
Basophils Relative: 0.5 % (ref 0.0–3.0)
Eosinophils Absolute: 0 10*3/uL (ref 0.0–0.7)
Eosinophils Relative: 0.9 % (ref 0.0–5.0)
HCT: 39.4 % (ref 39.0–52.0)
Hemoglobin: 12.7 g/dL — ABNORMAL LOW (ref 13.0–17.0)
Lymphocytes Relative: 44 % (ref 12.0–46.0)
Lymphs Abs: 2.2 10*3/uL (ref 0.7–4.0)
MCHC: 32.3 g/dL (ref 30.0–36.0)
MCV: 77.6 fl — ABNORMAL LOW (ref 78.0–100.0)
Monocytes Absolute: 0.4 10*3/uL (ref 0.1–1.0)
Monocytes Relative: 8.4 % (ref 3.0–12.0)
Neutro Abs: 2.3 10*3/uL (ref 1.4–7.7)
Neutrophils Relative %: 46.2 % (ref 43.0–77.0)
Platelets: 147 10*3/uL — ABNORMAL LOW (ref 150.0–400.0)
RBC: 5.07 Mil/uL (ref 4.22–5.81)
RDW: 14.5 % (ref 11.5–15.5)
WBC: 5 10*3/uL (ref 4.0–10.5)

## 2019-11-29 LAB — TESTOSTERONE: Testosterone: 260.71 ng/dL — ABNORMAL LOW (ref 300.00–890.00)

## 2019-12-02 ENCOUNTER — Ambulatory Visit: Payer: Self-pay | Admitting: Family Medicine

## 2019-12-17 ENCOUNTER — Telehealth: Payer: Self-pay | Admitting: Internal Medicine

## 2019-12-17 NOTE — Telephone Encounter (Signed)
Patient requests that his Preferred PHARM be: [facility] - [place], [place] - [address] [employer] Phone:  [phone]  Fax:  [phone]

## 2019-12-17 NOTE — Telephone Encounter (Signed)
Pt pharmacy already set for [facility] and [employer]

## 2020-01-07 ENCOUNTER — Encounter: Payer: Self-pay | Admitting: Family Medicine

## 2020-01-07 ENCOUNTER — Other Ambulatory Visit: Payer: Self-pay | Admitting: Family Medicine

## 2020-01-07 ENCOUNTER — Other Ambulatory Visit: Payer: Self-pay

## 2020-01-07 ENCOUNTER — Ambulatory Visit (INDEPENDENT_AMBULATORY_CARE_PROVIDER_SITE_OTHER): Payer: 59 | Admitting: Family Medicine

## 2020-01-07 VITALS — BP 134/86 | HR 93 | Temp 98.1°F | Resp 17 | Ht >= 80 in | Wt 286.6 lb

## 2020-01-07 DIAGNOSIS — Z794 Long term (current) use of insulin: Secondary | ICD-10-CM

## 2020-01-07 DIAGNOSIS — R7989 Other specified abnormal findings of blood chemistry: Secondary | ICD-10-CM | POA: Diagnosis not present

## 2020-01-07 DIAGNOSIS — Z0000 Encounter for general adult medical examination without abnormal findings: Secondary | ICD-10-CM | POA: Diagnosis not present

## 2020-01-07 DIAGNOSIS — Z09 Encounter for follow-up examination after completed treatment for conditions other than malignant neoplasm: Secondary | ICD-10-CM

## 2020-01-07 DIAGNOSIS — E1149 Type 2 diabetes mellitus with other diabetic neurological complication: Secondary | ICD-10-CM

## 2020-01-07 DIAGNOSIS — R7309 Other abnormal glucose: Secondary | ICD-10-CM | POA: Diagnosis not present

## 2020-01-07 DIAGNOSIS — F419 Anxiety disorder, unspecified: Secondary | ICD-10-CM

## 2020-01-07 DIAGNOSIS — Z760 Encounter for issue of repeat prescription: Secondary | ICD-10-CM

## 2020-01-07 DIAGNOSIS — N529 Male erectile dysfunction, unspecified: Secondary | ICD-10-CM

## 2020-01-07 DIAGNOSIS — R809 Proteinuria, unspecified: Secondary | ICD-10-CM

## 2020-01-07 DIAGNOSIS — E118 Type 2 diabetes mellitus with unspecified complications: Secondary | ICD-10-CM

## 2020-01-07 DIAGNOSIS — Z23 Encounter for immunization: Secondary | ICD-10-CM

## 2020-01-07 DIAGNOSIS — R739 Hyperglycemia, unspecified: Secondary | ICD-10-CM | POA: Diagnosis not present

## 2020-01-07 DIAGNOSIS — K05219 Aggressive periodontitis, localized, unspecified severity: Secondary | ICD-10-CM

## 2020-01-07 DIAGNOSIS — R634 Abnormal weight loss: Secondary | ICD-10-CM

## 2020-01-07 HISTORY — DX: Proteinuria, unspecified: R80.9

## 2020-01-07 LAB — POCT URINALYSIS DIPSTICK
Bilirubin, UA: NEGATIVE
Blood, UA: NEGATIVE
Glucose, UA: POSITIVE — AB
Ketones, UA: NEGATIVE
Leukocytes, UA: NEGATIVE
Nitrite, UA: NEGATIVE
Protein, UA: POSITIVE — AB
Spec Grav, UA: >=1.03 — AB (ref 1.010–1.025)
Urobilinogen, UA: 0.2 E.U./dL
pH, UA: 5.5 (ref 5.0–8.0)

## 2020-01-07 LAB — POCT GLYCOSYLATED HEMOGLOBIN (HGB A1C)
HbA1c POC (<> result, manual entry): 9.8 % (ref 4.0–5.6)
HbA1c, POC (controlled diabetic range): 9.8 % — AB (ref 0.0–7.0)
HbA1c, POC (prediabetic range): 9.8 % — AB (ref 5.7–6.4)
Hemoglobin A1C: 9.8 % — AB (ref 4.0–5.6)

## 2020-01-07 LAB — GLUCOSE, POCT (MANUAL RESULT ENTRY): POC Glucose: 89 mg/dl (ref 70–99)

## 2020-01-07 MED ORDER — ONETOUCH ULTRASOFT LANCETS MISC: 11 refills | Status: AC

## 2020-01-07 MED ORDER — GABAPENTIN 300 MG PO CAPS: 300.0000 mg | ORAL_CAPSULE | Freq: Every day | ORAL | 11 refills | Status: AC

## 2020-01-07 MED ORDER — ATORVASTATIN CALCIUM 10 MG PO TABS: 10.0000 mg | ORAL_TABLET | Freq: Every day | ORAL | 11 refills | Status: DC

## 2020-01-07 MED ORDER — SILDENAFIL CITRATE 100 MG PO TABS: 100.0000 mg | ORAL_TABLET | Freq: Every day | ORAL | 6 refills | Status: DC | PRN

## 2020-01-07 MED ORDER — IBUPROFEN 800 MG PO TABS: 800.0000 mg | ORAL_TABLET | Freq: Three times a day (TID) | ORAL | 6 refills | Status: DC | PRN

## 2020-01-07 MED ORDER — VICTOZA 18 MG/3ML ~~LOC~~ SOPN: PEN_INJECTOR | SUBCUTANEOUS | 11 refills | Status: DC

## 2020-01-07 MED ORDER — LANTUS SOLOSTAR 100 UNIT/ML ~~LOC~~ SOPN: 50.0000 [IU] | PEN_INJECTOR | Freq: Every day | SUBCUTANEOUS | 11 refills | Status: DC

## 2020-01-07 MED ORDER — INSULIN LISPRO PROT & LISPRO (75-25 MIX) 100 UNIT/ML KWIKPEN: PEN_INJECTOR | SUBCUTANEOUS | 11 refills | Status: DC

## 2020-01-07 MED ORDER — ONETOUCH VERIO VI STRP
ORAL_STRIP | 11 refills | Status: DC
Start: 2020-01-07 — End: 2020-07-30

## 2020-01-07 MED FILL — SILDENAFIL CITRATE 100 MG T: 100 | 30 days supply | Qty: 6 | Fill #0

## 2020-01-07 MED FILL — ONETOUCH DELICA PLUS LANCET: 25 days supply | Qty: 100 | Fill #0

## 2020-01-07 MED FILL — IBUPROFEN 800 MG TABLET: 800 | 10 days supply | Qty: 30 | Fill #0

## 2020-01-07 NOTE — Progress Notes (Signed)
Patient [facility]    Established Patient Office Visit  Subjective:  Patient ID: Jonathan [name], male    DOB: [birth year]  Age: 39 y.o. MRN: [id number]  CC:  Chief Complaint  Patient presents with  . Follow-up    HPI Jonathan [name] is a 39 year old male who presents for Follow Up today.    Patient Active Problem List   Diagnosis Date Noted  . Hypogonadotropic hypogonadism ([place]) 07/17/2019  . Hemoglobin A1C between 7% and 9% indicating borderline diabetic control 04/29/2019  . Decreased libido 04/29/2019  . Low testosterone in male 04/2019  . Non-pressure chronic ulcer of other part of left foot with necrosis of bone ([place])   . Cutaneous abscess of left foot   . Hemoglobin A1C greater than 9%, indicating poor diabetic control 08/27/2018  . Amputation of left great toe ([place]) 08/27/2018  . Osteomyelitis of great toe of left foot ([place]) 01/30/2018  . Osteomyelitis ([place]) 01/30/2018  . Depression with anxiety 01/30/2018  . Major depressive disorder, recurrent severe without psychotic features ([place]) 07/16/2017  . OD (overdose of drug), intentional self-harm, initial encounter ([place]) 07/15/2017  . Cellulitis 12/26/2016  . Open toe wound, subsequent encounter 12/12/2016  . Hyperglycemia 12/06/2016  . Diabetic ulcer of left foot ([place]) 12/05/2016  . Diabetic neuropathy ([place]) 10/16/2015  . Erectile dysfunction 08/05/2015  . Low testosterone 09/12/2014  . DM type 2, uncontrolled, with neuropathy ([place]) 06/12/2014  . Diabetes mellitus, type II ([place]) 03/23/2012  . Noncompliance 03/23/2012  . Gastroenteritis and colitis, viral 03/23/2012   Current Status: Since his last office visit, he is doing well with no complaints. He recently followed up with Dr. Kelton Pillar, Endocrinologist for low testosterone. He is being treated with Clomiphene (Clomid) at this time, which he states that his testosterone levels have increased. His most recent normal range  of preprandial blood glucose levels have been between 120-153. He has seen low range of 89 and high of 206 since his last office visit. He denies fatigue, frequent urination, blurred vision, excessive hunger, excessive thirst, weight gain, weight loss, and poor wound healing. He continues to check his feet regularly. He denies fevers, chills, fatigue, recent infections, weight loss, and night sweats. He has not had any headaches, dizziness, and falls. No chest pain, heart palpitations, cough and shortness of breath reported. Denies GI problems such as nausea, vomiting, diarrhea, and constipation. He has no reports of blood in stools, dysuria and hematuria. No depression or anxiety reported today. He is taking all medications as prescribed. He denies pain today.   Past Medical History:  Diagnosis Date  . Amputation of left great toe ([place]) 01/2019  . Anxiety   . Decreased libido 04/2019  . Diabetes mellitus   . Erectile dysfunction 04/2019  . Hyperglycemia 09/2019  . Low testosterone in male 04/2019  . Proteinuria 01/07/2020  . Vitamin D deficiency 04/2019    Past Surgical History:  Procedure Laterality Date  . AMPUTATION Left 02/02/2018   Procedure: LEFT GREAT TOE AMPUTATION;  Surgeon: Newt Minion, MD;  Location: [facility];  Service: Orthopedics;  Laterality: Left;  . AMPUTATION Left 02/08/2019   Procedure: LEFT FOOT 1ST RAY AMPUTATION;  Surgeon: Newt Minion, MD;  Location: [facility];  Service: Orthopedics;  Laterality: Left;    Family History  Problem Relation Age of Onset  . Other Mother        Healthy  . Other Father  Healthy    Social History   Socioeconomic History  . Marital status: Single    Spouse name: Not on file  . Number of children: Not on file  . Years of education: Not on file  . Highest education level: Not on file  Occupational History  . Not on file  Tobacco Use  . Smoking status: Never Smoker  . Smokeless tobacco: Never Used  Vaping Use  . Vaping Use:  Never used  Substance and Sexual Activity  . Alcohol use: Yes    Alcohol/week: 2.0 standard drinks    Types: 1 Glasses of wine, 1 Cans of beer per week    Comment: occassional  . Drug use: No  . Sexual activity: Yes  Other Topics Concern  . Not on file  Social History Narrative  . Not on file   Social Determinants of Health   Financial Resource Strain:   . Difficulty of Paying Living Expenses: Not on file  Food Insecurity:   . Worried About Charity fundraiser in the Last Year: Not on file  . Ran Out of Food in the Last Year: Not on file  Transportation Needs:   . Lack of Transportation (Medical): Not on file  . Lack of Transportation (Non-Medical): Not on file  Physical Activity:   . Days of Exercise per Week: Not on file  . Minutes of Exercise per Session: Not on file  Stress:   . Feeling of Stress : Not on file  Social Connections:   . Frequency of Communication with Friends and Family: Not on file  . Frequency of Social Gatherings with Friends and Family: Not on file  . Attends Religious Services: Not on file  . Active Member of Clubs or Organizations: Not on file  . Attends Archivist Meetings: Not on file  . Marital Status: Not on file  Intimate Partner Violence:   . Fear of Current or Ex-Partner: Not on file  . Emotionally Abused: Not on file  . Physically Abused: Not on file  . Sexually Abused: Not on file    Outpatient Medications Prior to Visit  Medication Sig Dispense Refill  . blood glucose meter kit and supplies Dispense based on patient and insurance preference. Use up to four times daily as directed. (FOR ICD-10 E10.9, E11.9). 1 each 0  . Blood Glucose Monitoring Suppl (ONETOUCH VERIO) w/Device KIT Inject 1 Units into the skin QID. Use meter 4 times daily as directed 1 kit 0  . clomiPHENE (CLOMID) 50 MG tablet Take 1 tablet (50 mg total) by mouth daily. 90 tablet 0  . Vitamin D, Ergocalciferol, (DRISDOL) 1.25 MG (50000 UNIT) CAPS capsule Take 1  capsule (50,000 Units total) by mouth every 7 (seven) days. 5 capsule 6  . atorvastatin (LIPITOR) 10 MG tablet Take 1 tablet (10 mg total) by mouth daily. 30 tablet 6  . gabapentin (NEURONTIN) 300 MG capsule Take 1 capsule (300 mg total) by mouth at bedtime. 30 capsule 11  . glucose blood (ONETOUCH VERIO) test strip Use as instructed 100 each 12  . glucose blood test strip Use test strips to check blood sugar four times daily as directed 100 each 3  . ibuprofen (ADVIL,MOTRIN) 800 MG tablet Take 1 tablet (800 mg total) by mouth every 8 (eight) hours as needed. 30 tablet 2  . insulin glargine (LANTUS SOLOSTAR) 100 UNIT/ML Solostar Pen Inject 50 Units into the skin daily. 5 pen PRN  . Insulin Lispro Prot & Lispro (HUMALOG  MIX 75/25 KWIKPEN) (75-25) 100 UNIT/ML Kwikpen INJECT 35 UNITS INTO THE SKIN 2 (TWO) TIMES DAILY. 20 mL 11  . Lancets (ONETOUCH ULTRASOFT) lancets Use 4 times daily as directed  E11.42, Z79.4 100 each 12  . liraglutide (VICTOZA) 18 MG/3ML SOPN Start 0.54m SQ once a day for 7 days, then increase to 1.280monce a day 2 pen prn  . sildenafil (VIAGRA) 100 MG tablet TAKE 1 TABLET (100 MG TOTAL) BY MOUTH DAILY AS NEEDED FOR ERECTILE DYSFUNCTION. 4 tablet 0   No facility-administered medications prior to visit.    No Known Allergies  ROS Review of Systems  Constitutional: Negative.   HENT: Negative.   Eyes: Negative.   Respiratory: Negative.   Cardiovascular: Negative.   Gastrointestinal: Negative.   Endocrine: Negative.   Genitourinary: Negative.   Musculoskeletal: Negative.   Skin: Negative.   Allergic/Immunologic: Negative.   Neurological: Positive for dizziness (occasional ) and headaches (occasional ).  Hematological: Negative.   Psychiatric/Behavioral: Negative.       Objective:    Physical Exam Vitals and nursing note reviewed.  Constitutional:      Appearance: Normal appearance.  HENT:     Head: Normocephalic and atraumatic.     Nose: Nose normal.      Mouth/Throat:     Mouth: Mucous membranes are moist.     Pharynx: Oropharynx is clear.  Cardiovascular:     Rate and Rhythm: Normal rate and regular rhythm.     Pulses: Normal pulses.     Heart sounds: Normal heart sounds.  Pulmonary:     Effort: Pulmonary effort is normal.     Breath sounds: Normal breath sounds.  Abdominal:     General: Bowel sounds are normal.     Palpations: Abdomen is soft.  Musculoskeletal:        General: Normal range of motion.     Cervical back: Normal range of motion and neck supple.  Skin:    General: Skin is warm and dry.  Neurological:     General: No focal deficit present.     Mental Status: He is alert and oriented to person, place, and time.  Psychiatric:        Mood and Affect: Mood normal.        Behavior: Behavior normal.        Thought Content: Thought content normal.        Judgment: Judgment normal.     BP 134/86 (BP Location: Left Arm, Patient Position: Sitting, Cuff Size: Large)   Pulse 93   Temp 98.1 F (36.7 C)   Resp 17   Ht _0  (2.032 m)   Wt 286 lb 9.6 oz (130 kg)   SpO2 100%   BMI 31.48 kg/m  Wt Readings from Last 3 Encounters:  01/07/20 286 lb 9.6 oz (130 kg)  10/25/19 286 lb 6.4 oz (129.9 kg)  10/16/19 279 lb (126.6 kg)     Health Maintenance Due  Topic Date Due  . Hepatitis C Screening  Never done  . PNEUMOCOCCAL POLYSACCHARIDE VACCINE AGE 75-64 HIGH RISK  Never done  . COVID-19 Vaccine (1) Never done  . FOOT EXAM  10/15/2016    There are no preventive care reminders to display for this patient.  Lab Results  Component Value Date   TSH 1.74 07/15/2019   Lab Results  Component Value Date   WBC 5.0 11/29/2019   HGB 12.7 (L) 11/29/2019   HCT 39.4 11/29/2019   MCV 77.6 (L)  11/29/2019   PLT 147.0 (L) 11/29/2019   Lab Results  Component Value Date   NA 138 11/29/2019   K 4.4 11/29/2019   CO2 28 11/29/2019   GLUCOSE 249 (H) 11/29/2019   BUN 12 11/29/2019   CREATININE 1.05 11/29/2019   BILITOT 0.3  11/29/2019   ALKPHOS 55 11/29/2019   AST 22 11/29/2019   ALT 29 11/29/2019   PROT 7.0 11/29/2019   ALBUMIN 4.0 11/29/2019   CALCIUM 9.2 11/29/2019   ANIONGAP 11 03/10/2018   GFR 95.72 11/29/2019   Lab Results  Component Value Date   CHOL 107 04/29/2019   Lab Results  Component Value Date   HDL 45 04/29/2019   Lab Results  Component Value Date   LDLCALC 40 04/29/2019   Lab Results  Component Value Date   TRIG 122 04/29/2019   Lab Results  Component Value Date   CHOLHDL 2.4 04/29/2019   Lab Results  Component Value Date   HGBA1C 9.8 (A) 01/07/2020   HGBA1C 9.8 01/07/2020   HGBA1C 9.8 (A) 01/07/2020   HGBA1C 9.8 (A) 01/07/2020    Assessment & Plan:   1. Type 2 diabetes mellitus with complication, with long-term current use of insulin (HCC) Stable. He will continue medication as prescribed, to decrease foods/beverages high in sugars and carbs and follow Heart Healthy or DASH diet. Increase physical activity to at least 30 minutes cardio exercise daily.  - Urinalysis Dipstick - POC Glucose (CBG) - POC HgB A1c - atorvastatin (LIPITOR) 10 MG tablet; Take 1 tablet (10 mg total) by mouth daily.  Dispense: 30 tablet; Refill: 11 - insulin glargine (LANTUS SOLOSTAR) 100 UNIT/ML Solostar Pen; Inject 50 Units into the skin daily.  Dispense: 15 mL; Refill: 11 - Insulin Lispro Prot & Lispro (HUMALOG MIX 75/25 KWIKPEN) (75-25) 100 UNIT/ML Kwikpen; INJECT 35 UNITS INTO THE SKIN 2 (TWO) TIMES DAILY.  Dispense: 20 mL; Refill: 11 - liraglutide (VICTOZA) 18 MG/3ML SOPN; Start 0.42m SQ once a day for 7 days, then increase to 1.219monce a day  Dispense: 3 mL; Refill: 11  2. Hemoglobin A1C greater than 9%, indicating poor diabetic control Hgb A1c is stable at 9.8 today. Monitor.  - insulin glargine (LANTUS SOLOSTAR) 100 UNIT/ML Solostar Pen; Inject 50 Units into the skin daily.  Dispense: 15 mL; Refill: 11 - Insulin Lispro Prot & Lispro (HUMALOG MIX 75/25 KWIKPEN) (75-25) 100 UNIT/ML  Kwikpen; INJECT 35 UNITS INTO THE SKIN 2 (TWO) TIMES DAILY.  Dispense: 20 mL; Refill: 11 - liraglutide (VICTOZA) 18 MG/3ML SOPN; Start 0.68m43mQ once a day for 7 days, then increase to 1.2mg368mce a day  Dispense: 3 mL; Refill: 11  3. Hyperglycemia - insulin glargine (LANTUS SOLOSTAR) 100 UNIT/ML Solostar Pen; Inject 50 Units into the skin daily.  Dispense: 15 mL; Refill: 11 - liraglutide (VICTOZA) 18 MG/3ML SOPN; Start 0.68mg 30monce a day for 7 days, then increase to 1.2mg o55m a day  Dispense: 3 mL; Refill: 11  4. Low testosterone in male Continue to follow up with Endocrinology as needed.   5. Anxiety  6. Weight loss  7. Healthcare maintenance - Flu Vaccine QUAD 6+ mos PF IM (Fluarix Quad PF) - Pneumococcal conjugate vaccine 13-valent IM  8. Proteinuria, unspecified type - Microalbumin/Creatinine Ratio, Urine  9. Other diabetic neurological complication associated with type 2 diabetes mellitus (HCC) - gabapentin (NEURONTIN) 300 MG capsule; Take 1 capsule (300 mg total) by mouth at bedtime.  Dispense: 30 capsule; Refill: 11  10. Gum  abscess Stable.  - ibuprofen (ADVIL) 800 MG tablet; Take 1 tablet (800 mg total) by mouth every 8 (eight) hours as needed.  Dispense: 30 tablet; Refill: 6  11. Erectile dysfunction, unspecified erectile dysfunction type - sildenafil (VIAGRA) 100 MG tablet; Take 1 tablet (100 mg total) by mouth daily as needed for erectile dysfunction.  Dispense: 10 tablet; Refill: 6  12. Medication refill  13. Follow up He will follow up in 6 months.   Meds ordered this encounter  Medications  . atorvastatin (LIPITOR) 10 MG tablet    Sig: Take 1 tablet (10 mg total) by mouth daily.    Dispense:  30 tablet    Refill:  11  . gabapentin (NEURONTIN) 300 MG capsule    Sig: Take 1 capsule (300 mg total) by mouth at bedtime.    Dispense:  30 capsule    Refill:  11  . glucose blood (ONETOUCH VERIO) test strip    Sig: Use as instructed    Dispense:  100 each     Refill:  11  . ibuprofen (ADVIL) 800 MG tablet    Sig: Take 1 tablet (800 mg total) by mouth every 8 (eight) hours as needed.    Dispense:  30 tablet    Refill:  6  . insulin glargine (LANTUS SOLOSTAR) 100 UNIT/ML Solostar Pen    Sig: Inject 50 Units into the skin daily.    Dispense:  15 mL    Refill:  11  . Insulin Lispro Prot & Lispro (HUMALOG MIX 75/25 KWIKPEN) (75-25) 100 UNIT/ML Kwikpen    Sig: INJECT 35 UNITS INTO THE SKIN 2 (TWO) TIMES DAILY.    Dispense:  20 mL    Refill:  11  . Lancets (ONETOUCH ULTRASOFT) lancets    Sig: Use 4 times daily as directed  E11.42, Z79.4    Dispense:  100 each    Refill:  11  . liraglutide (VICTOZA) 18 MG/3ML SOPN    Sig: Start 0.66m SQ once a day for 7 days, then increase to 1.216monce a day    Dispense:  3 mL    Refill:  11  . sildenafil (VIAGRA) 100 MG tablet    Sig: Take 1 tablet (100 mg total) by mouth daily as needed for erectile dysfunction.    Dispense:  10 tablet    Refill:  6    Orders Placed This Encounter  Procedures  . Flu Vaccine QUAD 6+ mos PF IM (Fluarix Quad PF)  . Pneumococcal conjugate vaccine 13-valent IM  . Microalbumin/Creatinine Ratio, Urine  . Urinalysis Dipstick  . POC Glucose (CBG)  . POC HgB A1c    Referral Orders  No referral(s) requested today    NaKathe Becton MSN, FNP-BC [place] [phone]    Problem List Items Addressed This Visit      Endocrine   Diabetic neuropathy ([place]  Relevant Medications   atorvastatin (LIPITOR) 10 MG tablet   gabapentin (NEURONTIN) 300 MG capsule   insulin glargine (LANTUS SOLOSTAR) 100 UNIT/ML Solostar Pen   Insulin Lispro Prot & Lispro (HUMALOG MIX 75/25 KWIKPEN) (75-25) 100 UNIT/ML Kwikpen   liraglutide (VICTOZA) 18 MG/3ML SOPN   Hemoglobin A1C greater than 9%, indicating poor diabetic control   Relevant  Medications   atorvastatin (LIPITOR) 10 MG tablet   insulin glargine (LANTUS SOLOSTAR) 100 UNIT/ML Solostar Pen   Insulin  Lispro Prot & Lispro (HUMALOG MIX 75/25 KWIKPEN) (75-25) 100 UNIT/ML Kwikpen   liraglutide (VICTOZA) 18 MG/3ML SOPN     Other   Erectile dysfunction   Relevant Medications   sildenafil (VIAGRA) 100 MG tablet   Hyperglycemia   Relevant Medications   insulin glargine (LANTUS SOLOSTAR) 100 UNIT/ML Solostar Pen   liraglutide (VICTOZA) 18 MG/3ML SOPN   Low testosterone in male    Other Visit Diagnoses    Type 2 diabetes mellitus with complication, with long-term current use of insulin (HCC)    -  Primary   Relevant Medications   atorvastatin (LIPITOR) 10 MG tablet   insulin glargine (LANTUS SOLOSTAR) 100 UNIT/ML Solostar Pen   Insulin Lispro Prot & Lispro (HUMALOG MIX 75/25 KWIKPEN) (75-25) 100 UNIT/ML Kwikpen   liraglutide (VICTOZA) 18 MG/3ML SOPN   Other Relevant Orders   Urinalysis Dipstick (Completed)   POC Glucose (CBG) (Completed)   POC HgB A1c (Completed)   Anxiety       Weight loss       Healthcare maintenance       Relevant Orders   Flu Vaccine QUAD 6+ mos PF IM (Fluarix Quad PF) (Completed)   Pneumococcal conjugate vaccine 13-valent IM (Completed)   Proteinuria, unspecified type       Relevant Orders   Microalbumin/Creatinine Ratio, Urine   Gum abscess       Relevant Medications   ibuprofen (ADVIL) 800 MG tablet   Medication refill       Follow up          Meds ordered this encounter  Medications  . atorvastatin (LIPITOR) 10 MG tablet    Sig: Take 1 tablet (10 mg total) by mouth daily.    Dispense:  30 tablet    Refill:  11  . gabapentin (NEURONTIN) 300 MG capsule    Sig: Take 1 capsule (300 mg total) by mouth at bedtime.    Dispense:  30 capsule    Refill:  11  . glucose blood (ONETOUCH VERIO) test strip    Sig: Use as instructed    Dispense:  100 each    Refill:  11  . ibuprofen (ADVIL) 800 MG tablet    Sig: Take 1 tablet (800 mg  total) by mouth every 8 (eight) hours as needed.    Dispense:  30 tablet    Refill:  6  . insulin glargine (LANTUS SOLOSTAR) 100 UNIT/ML Solostar Pen    Sig: Inject 50 Units into the skin daily.    Dispense:  15 mL    Refill:  11  . Insulin Lispro Prot & Lispro (HUMALOG MIX 75/25 KWIKPEN) (75-25) 100 UNIT/ML Kwikpen    Sig: INJECT 35 UNITS INTO THE SKIN 2 (TWO) TIMES DAILY.    Dispense:  20 mL    Refill:  11  . Lancets (ONETOUCH ULTRASOFT) lancets    Sig: Use 4 times daily as directed  E11.42, Z79.4    Dispense:  100 each    Refill:  11  . liraglutide (VICTOZA) 18 MG/3ML SOPN    Sig: Start 0.65m SQ once a day for 7 days, then increase to 1.222monce a day    Dispense:  3 mL    Refill:  11  . sildenafil (VIAGRA) 100 MG tablet    Sig: Take 1 tablet (100 mg total) by mouth daily as needed for erectile dysfunction.    Dispense:  10 tablet    Refill:  6    Follow-up: Return in  about 6 months (around 07/06/2020).    Azzie Glatter, FNP

## 2020-01-08 ENCOUNTER — Encounter: Payer: Self-pay | Admitting: Family Medicine

## 2020-01-08 LAB — MICROALBUMIN / CREATININE URINE RATIO
Creatinine, Urine: 267 mg/dL
Microalb/Creat Ratio: 16 mg/g creat (ref 0–29)
Microalbumin, Urine: 42.2 ug/mL

## 2020-01-15 ENCOUNTER — Telehealth: Payer: Self-pay | Admitting: Family Medicine

## 2020-01-15 NOTE — Telephone Encounter (Signed)
Pt was called concerning awv w/ pcc. Unable to lvm

## 2020-01-22 MED FILL — LANTUS SOLOSTAR 100 UNITS/M: 100 | 30 days supply | Qty: 15 | Fill #0

## 2020-01-22 MED FILL — GABAPENTIN 300 MG CAPSULE: 300 | 30 days supply | Qty: 30 | Fill #0

## 2020-01-22 MED FILL — HUMALOG MIX 75-25 KWIKPEN: (75-25) 100 | 25 days supply | Qty: 18 | Fill #0

## 2020-01-22 MED FILL — VICTOZA 2-PAK 18 MG/3 ML PE: 18 | 17 days supply | Qty: 3 | Fill #0

## 2020-01-31 ENCOUNTER — Ambulatory Visit: Payer: 59 | Admitting: Internal Medicine

## 2020-02-04 NOTE — Progress Notes (Signed)
Name: Jonathan [name]  MRN/ DOB: [id number], May 08, 1980    Age/ Sex: 39 y.o., male     PCP: Azzie Glatter, FNP   Reason for Endocrinology Evaluation: Hypogonadism      Initial Endocrinology Clinic Visit: 07/15/2019      PATIENT IDENTIFIER: Jonathan [name] is a 39 y.o., male with a past medical history of T2DM, and Hypogonadism. He has followed with [facility] since 07/15/2019 for consultative assistance with management of his Hypogonadism .      HISTORICAL SUMMARY: The patient was first diagnosed with   hypogonadism in 2016. He has had multiple testosterone readings with a nadir of 125 ng/dL, as well as low sex hormone binding globulin. He was on Testosterone Cypionate at some point.   No hx of long term narcotic or illicit drug use.  No hx of head trauma  No of testicular childhood infection that he could recall.  No FH of prostate cancer    Testicular size on initial presentation 20 mL bilaterally, with a total testosterone of 167 ng/dL, inappropriately normal FSH and LH 3.4 and 7.54 mIU/mL respectively. Normal prolactin, TFT and cortisol.   MRI of brain did NOT show any pituitary pathology (08/22/2019)   He was prescribed Clomiphene 50 mg daily which was started 08/2019. Repeat testosterone increased from 155 ng/dL to 260 ng/dL.   MRI showed normal pituitary gland   SUBJECTIVE:   Today (02/05/2020):  Jonathan [name] is here for a follow up on hypogonadotropic hypogonadism  . He has been on Clomiphene for a few months without side effects   He has noted better energy level as well as erections but he continues with dry erections   Has normal libido     HISTORY:  Past Medical History:  Past Medical History:  Diagnosis Date  . Amputation of left great toe ([place]) 01/2019  . Anxiety   . Decreased libido 04/2019  . Diabetes mellitus   . Erectile dysfunction 04/2019  . Hyperglycemia 09/2019  . Low testosterone in male 04/2019  . Proteinuria  01/07/2020  . Vitamin D deficiency 04/2019   Past Surgical History:  Past Surgical History:  Procedure Laterality Date  . AMPUTATION Left 02/02/2018   Procedure: LEFT GREAT TOE AMPUTATION;  Surgeon: Newt Minion, MD;  Location: [place];  Service: Orthopedics;  Laterality: Left;  . AMPUTATION Left 02/08/2019   Procedure: LEFT FOOT 1ST RAY AMPUTATION;  Surgeon: Newt Minion, MD;  Location: [place];  Service: Orthopedics;  Laterality: Left;    Social History:  reports that he has never smoked. He has never used smokeless tobacco. He reports current alcohol use of about 2.0 standard drinks of alcohol per week. He reports that he does not use drugs. Family History:  Family History  Problem Relation Age of Onset  . Other Mother        Healthy  . Other Father        Healthy     HOME MEDICATIONS: Allergies as of 02/05/2020   No Known Allergies     Medication List       Accurate as of February 05, 2020  1:33 PM. If you have any questions, ask your nurse or doctor.        atorvastatin 10 MG tablet Commonly known as: LIPITOR Take 1 tablet (10 mg total) by mouth daily.   blood glucose meter kit and supplies Dispense based on patient and insurance preference. Use up to four times daily as directed. (FOR  ICD-10 E10.9, E11.9).   clomiPHENE 50 MG tablet Commonly known as: CLOMID Take 1 tablet (50 mg total) by mouth daily.   gabapentin 300 MG capsule Commonly known as: NEURONTIN Take 1 capsule (300 mg total) by mouth at bedtime.   ibuprofen 800 MG tablet Commonly known as: ADVIL Take 1 tablet (800 mg total) by mouth every 8 (eight) hours as needed.   Insulin Lispro Prot & Lispro (75-25) 100 UNIT/ML Kwikpen Commonly known as: HumaLOG Mix 75/25 KwikPen INJECT 35 UNITS INTO THE SKIN 2 (TWO) TIMES DAILY.   Lantus SoloStar 100 UNIT/ML Solostar Pen Generic drug: insulin glargine Inject 50 Units into the skin daily.   onetouch ultrasoft lancets Use 4 times daily as directed   E11.42, Z79.4   OneTouch Verio test strip Generic drug: glucose blood Use as instructed   OneTouch Verio w/Device Kit Inject 1 Units into the skin QID. Use meter 4 times daily as directed   sildenafil 100 MG tablet Commonly known as: VIAGRA Take 1 tablet (100 mg total) by mouth daily as needed for erectile dysfunction.   Victoza 18 MG/3ML Sopn Generic drug: liraglutide Start 0.23m SQ once a day for 7 days, then increase to 1.240monce a day   Vitamin D (Ergocalciferol) 1.25 MG (50000 UNIT) Caps capsule Commonly known as: DRISDOL Take 1 capsule (50,000 Units total) by mouth every 7 (seven) days.         OBJECTIVE:   PHYSICAL EXAM: VS: BP 136/84   Pulse 99   Ht _0  (2.032 m)   Wt 286 lb 8 oz (130 kg)   SpO2 99%   BMI 31.47 kg/m    EXAM: General: Pt appears well and is in NAD  Neck: General: Supple without adenopathy. Thyroid: Thyroid size normal.  No goiter or nodules appreciated. No thyroid bruit.  Lungs: Clear with good BS bilat with no rales, rhonchi, or wheezes  Heart: Auscultation: RRR.  Abdomen: Normoactive bowel sounds, soft, nontender, without masses or organomegaly palpable  Extremities:  BL LE: No pretibial edema normal ROM and strength.  Mental Status: Judgment, insight: Intact Orientation: Oriented to time, place, and person Mood and affect: No depression, anxiety, or agitation     DATA REVIEWED: Results for TINATHANIAL, ARRIGHIMRN [id number] of 02/05/2020 13:33  Ref. Range 02/05/2020 07:56  Sodium Latest Ref Range: 135 - 145 mEq/L 142  Potassium Latest Ref Range: 3.5 - 5.1 mEq/L 4.4  Chloride Latest Ref Range: 96 - 112 mEq/L 107  CO2 Latest Ref Range: 19 - 32 mEq/L 29  Glucose Latest Ref Range: 70 - 99 mg/dL 186 (H)  BUN Latest Ref Range: 6 - 23 mg/dL 12  Creatinine Latest Ref Range: 0.40 - 1.50 mg/dL 0.99  Calcium Latest Ref Range: 8.4 - 10.5 mg/dL 9.8  Alkaline Phosphatase Latest Ref Range: 39 - 117 U/L 66  Albumin Latest Ref Range: 3.5 -  5.2 g/dL 4.1  AST Latest Ref Range: 0 - 37 U/L 22  ALT Latest Ref Range: 0 - 53 U/L 28  Total Protein Latest Ref Range: 6.0 - 8.3 g/dL 7.5  Total Bilirubin Latest Ref Range: 0.2 - 1.2 mg/dL 0.2  GFR Latest Ref Range: >60.00 mL/min 96.95  WBC Latest Ref Range: 4.0 - 10.5 K/uL 5.2  RBC Latest Ref Range: 4.22 - 5.81 Mil/uL 5.76  Hemoglobin Latest Ref Range: 13.0 - 17.0 g/dL 14.2  HCT Latest Ref Range: 39 - 52 % 44.7  MCV Latest Ref Range: 78.0 - 100.0 fl 77.6 (L)  MCHC Latest Ref Range: 30.0 - 36.0 g/dL 31.7  RDW Latest Ref Range: 11.5 - 15.5 % 14.0  Platelets Latest Ref Range: 150 - 400 K/uL 135.0 (L)  Testosterone Latest Ref Range: 300.00 - 890.00 ng/dL 291.65 (L)   MRI Brain 08/22/2019   Brain: Dynamic pituitary protocol. The sella is shallow. The pituitary is normal in height 5.4 mm. The pituitary enhances homogeneously. No microadenoma or macro adenoma. Infundibulum midline. Normal cavernous sinus and optic chiasm.  Ventricle size normal. Cerebral volume normal. Negative for acute infarct hemorrhage or mass. Few small white matter hyperintensities bilaterally appear chronic.  Vascular: Normal arterial flow voids  Skull and upper cervical spine: Normal bone marrow.  Sinuses/Orbits: Mild mucosal edema paranasal sinuses.  Normal orbit  Other: None  IMPRESSION: Negative for pituitary lesion. The sella is shallow however the pituitary is normal in size and enhances homogeneously.  No acute intracranial abnormality. Few small white matter hyperintensities bilaterally may reflect chronic microvascular ischemia. Correlate with risk factors for small vessel disease.  ASSESSMENT / PLAN / RECOMMENDATIONS:   1. Hypogonadotrophic Hypogonadism :  - Unknown cause, as he has no hx of head injury, or chronic narcotic or marijuana use.  - Pituitary MRI was unrevealing (07/2019) - Of note the pt was on testosterone therapy at some point in the past in the form of injectables.  Unclear if this is the cause of his secondary hypogonadism  - Due to his intention to have children in the future, we have opted to treat with clomiphene. He understands this is an off label use.    Medications   Continue Clomiphene 50 mg daily     F/U in 6 months    Signed electronically by: Mack Guise, MD  [facility] Endocrinology  [facility] [address] [place], [place] [address] Phone: [phone] FAX: [phone]      CC: Azzie Glatter, [address] [place] [address] Phone: [phone]  Fax: [phone]   Return to Endocrinology clinic as below: Future Appointments  Date Time Provider [place]  07/06/2020  9:00 AM Azzie Glatter, FNP [facility] None  08/12/2020  7:30 AM Mahrosh Donnell, Melanie Crazier, MD [facility] None

## 2020-02-05 ENCOUNTER — Ambulatory Visit (INDEPENDENT_AMBULATORY_CARE_PROVIDER_SITE_OTHER): Payer: 59 | Admitting: Internal Medicine

## 2020-02-05 ENCOUNTER — Telehealth: Payer: Self-pay | Admitting: Internal Medicine

## 2020-02-05 ENCOUNTER — Other Ambulatory Visit: Payer: Self-pay

## 2020-02-05 ENCOUNTER — Encounter: Payer: Self-pay | Admitting: Internal Medicine

## 2020-02-05 VITALS — BP 136/84 | HR 99 | Ht >= 80 in | Wt 286.5 lb

## 2020-02-05 DIAGNOSIS — E230 Hypopituitarism: Secondary | ICD-10-CM | POA: Diagnosis not present

## 2020-02-05 LAB — TESTOSTERONE: Testosterone: 291.65 ng/dL — ABNORMAL LOW (ref 300.00–890.00)

## 2020-02-05 LAB — COMPREHENSIVE METABOLIC PANEL
ALT: 28 U/L (ref 0–53)
AST: 22 U/L (ref 0–37)
Albumin: 4.1 g/dL (ref 3.5–5.2)
Alkaline Phosphatase: 66 U/L (ref 39–117)
BUN: 12 mg/dL (ref 6–23)
CO2: 29 mEq/L (ref 19–32)
Calcium: 9.8 mg/dL (ref 8.4–10.5)
Chloride: 107 mEq/L (ref 96–112)
Creatinine, Ser: 0.99 mg/dL (ref 0.40–1.50)
GFR: 96.95 mL/min (ref >60.00)
Glucose, Bld: 186 mg/dL — ABNORMAL HIGH (ref 70–99)
Potassium: 4.4 mEq/L (ref 3.5–5.1)
Sodium: 142 mEq/L (ref 135–145)
Total Bilirubin: 0.2 mg/dL (ref 0.2–1.2)
Total Protein: 7.5 g/dL (ref 6.0–8.3)

## 2020-02-05 LAB — CBC
HCT: 44.7 % (ref 39.0–52.0)
Hemoglobin: 14.2 g/dL (ref 13.0–17.0)
MCHC: 31.7 g/dL (ref 30.0–36.0)
MCV: 77.6 fl — ABNORMAL LOW (ref 78.0–100.0)
Platelets: 135 10*3/uL — ABNORMAL LOW (ref 150.0–400.0)
RBC: 5.76 Mil/uL (ref 4.22–5.81)
RDW: 14 % (ref 11.5–15.5)
WBC: 5.2 10*3/uL (ref 4.0–10.5)

## 2020-02-05 MED ORDER — CLOMIPHENE CITRATE 50 MG PO TABS
50.0000 mg | ORAL_TABLET | Freq: Every day | ORAL | 11 refills | Status: DC
Start: 2020-02-05 — End: 2020-04-24

## 2020-02-05 NOTE — Telephone Encounter (Signed)
Jonathan [name],   Can you please either do a new PA or follow up on the last one.  The pt is still paying cash for his Clomiphene     Thanks   Abby Raelyn Mora, MD  [facility] Endocrinology  [facility] [address] [place], [place] [address] Phone: [phone] FAX: [phone]

## 2020-02-05 NOTE — Patient Instructions (Signed)
-   Continue Clomiphene 50 mg daily

## 2020-02-05 NOTE — Telephone Encounter (Signed)
PA have been completed. Waiting on response from insurance.

## 2020-02-06 MED FILL — ATORVASTATIN 10 MG TABLET: 10 | 30 days supply | Qty: 30 | Fill #0

## 2020-02-07 NOTE — Telephone Encounter (Signed)
PA was denial and I have place the fax in [facility] inbox to review

## 2020-02-10 ENCOUNTER — Encounter: Payer: Self-pay | Admitting: Internal Medicine

## 2020-02-10 NOTE — Telephone Encounter (Signed)
I contacted the appeal department for at [phone]  And [phone] to appeal the denial for his Clomiphene    Mr. Koeppen account is labeled as an " Employee  Account" and they were unable to access his account    Their advise was to proceed with PA through  [facility] rather then optum .    They are only accepting fax   Standard fax [phone] Expedited [phone]    Please proceed with PA through [facility]   Thanks    Abby Raelyn Mora, MD  [facility] Endocrinology  [facility] [address] [place], [place] [address] Phone: [phone] FAX: [phone]

## 2020-02-10 NOTE — Telephone Encounter (Signed)
error 

## 2020-02-25 MED FILL — ATORVASTATIN 10 MG TABLET: 10 | 30 days supply | Qty: 30 | Fill #0

## 2020-02-25 MED FILL — VICTOZA 18 MG/3 ML INJECT P: 18 | 30 days supply | Qty: 6 | Fill #1

## 2020-02-25 MED FILL — GABAPENTIN 300 MG CAPSULE: 300 | 30 days supply | Qty: 30 | Fill #1

## 2020-02-25 MED FILL — HUMALOG MIX 75-25 KWIKPEN: (75-25) 100 | 25 days supply | Qty: 18 | Fill #1

## 2020-02-25 MED FILL — LANTUS SOLOSTAR 100 UNITS/M: 100 | 30 days supply | Qty: 15 | Fill #1

## 2020-03-03 NOTE — Telephone Encounter (Signed)
Letter for appeal was created as it was requested from the PA department. I have faxed the letter to [employer].

## 2020-03-09 ENCOUNTER — Emergency Department (HOSPITAL_COMMUNITY)
Admission: EM | Admit: 2020-03-09 | Discharge: 2020-03-09 | Disposition: A | Discharge status: Home / Self Care | Payer: 59 | Attending: Emergency Medicine | Admitting: Emergency Medicine

## 2020-03-09 ENCOUNTER — Encounter (HOSPITAL_COMMUNITY): Payer: Self-pay | Admitting: *Deleted

## 2020-03-09 ENCOUNTER — Other Ambulatory Visit: Payer: Self-pay

## 2020-03-09 DIAGNOSIS — R519 Headache, unspecified: Secondary | ICD-10-CM | POA: Insufficient documentation

## 2020-03-09 DIAGNOSIS — E1165 Type 2 diabetes mellitus with hyperglycemia: Secondary | ICD-10-CM | POA: Insufficient documentation

## 2020-03-09 DIAGNOSIS — Z7984 Long term (current) use of oral hypoglycemic drugs: Secondary | ICD-10-CM | POA: Diagnosis not present

## 2020-03-09 DIAGNOSIS — R000 Tachycardia, unspecified: Secondary | ICD-10-CM | POA: Diagnosis not present

## 2020-03-09 DIAGNOSIS — R509 Fever, unspecified: Secondary | ICD-10-CM | POA: Insufficient documentation

## 2020-03-09 DIAGNOSIS — R059 Cough, unspecified: Secondary | ICD-10-CM | POA: Diagnosis not present

## 2020-03-09 DIAGNOSIS — E1140 Type 2 diabetes mellitus with diabetic neuropathy, unspecified: Secondary | ICD-10-CM | POA: Diagnosis not present

## 2020-03-09 DIAGNOSIS — Z20822 Contact with and (suspected) exposure to covid-19: Secondary | ICD-10-CM | POA: Diagnosis not present

## 2020-03-09 DIAGNOSIS — Z794 Long term (current) use of insulin: Secondary | ICD-10-CM | POA: Diagnosis not present

## 2020-03-09 LAB — CBG MONITORING, ED: Glucose-Capillary: 74 mg/dL (ref 70–99)

## 2020-03-09 LAB — SARS CORONAVIRUS 2 (TAT 6-24 HRS): SARS Coronavirus 2: NEGATIVE

## 2020-03-09 MED ORDER — ACETAMINOPHEN 325 MG PO TABS
650.0000 mg | ORAL_TABLET | Freq: Once | ORAL | Status: AC
  Administered 2020-03-09: 650 mg via ORAL
  Filled 2020-03-09: qty 2

## 2020-03-09 NOTE — ED Triage Notes (Signed)
Covid/flu symptoms since Sat headache, chilss, fever, sorethroat

## 2020-03-09 NOTE — Discharge Instructions (Signed)
Your COVID test is sent and results are pending.  If your COVID test is positive you will need to stay home for 10 days  Take Tylenol or Motrin for fever please get a pulse ox and if your oxygen is less than 90% return to the ED for   See your doctor for follow-up  Return to ER if you have worse cough, fever, oxygen less than 90%.     Person Under Monitoring Name: Jonathan [name]  Location: [address] [place] [address]   Infection Prevention Recommendations for Individuals Confirmed to have, or Being Evaluated for, 2019 Novel Coronavirus (COVID-19) Infection Who Receive Care at Home  Individuals who are confirmed to have, or are being evaluated for, COVID-19 should follow the prevention steps below until a healthcare provider or local or state health department says they can return to normal activities.  Stay home except to get medical care You should restrict activities outside your home, except for getting medical care. Do not go to work, school, or public areas, and do not use public transportation or taxis.  Call ahead before visiting your doctor Before your medical appointment, call the healthcare provider and tell them that you have, or are being evaluated for, COVID-19 infection. This will help the healthcare providers office take steps to keep other people from getting infected. Ask your healthcare provider to call the local or state health department.  Monitor your symptoms Seek prompt medical attention if your illness is worsening (e.g., difficulty breathing). Before going to your medical appointment, call the healthcare provider and tell them that you have, or are being evaluated for, COVID-19 infection. Ask your healthcare provider to call the local or state health department.  Wear a facemask You should wear a facemask that covers your nose and mouth when you are in the same room with other people and when you visit a healthcare provider.  People who live with or visit you should also wear a facemask while they are in the same room with you.  Separate yourself from other people in your home As much as possible, you should stay in a different room from other people in your home. Also, you should use a separate bathroom, if available.  Avoid sharing household items You should not share dishes, drinking glasses, cups, eating utensils, towels, bedding, or other items with other people in your home. After using these items, you should wash them thoroughly with soap and water.  Cover your coughs and sneezes Cover your mouth and nose with a tissue when you cough or sneeze, or you can cough or sneeze into your sleeve. Throw used tissues in a lined trash can, and immediately wash your hands with soap and water for at least 20 seconds or use an alcohol-based hand rub.  Wash your [employer] your hands often and thoroughly with soap and water for at least 20 seconds. You can use an alcohol-based hand sanitizer if soap and water are not available and if your hands are not visibly dirty. Avoid touching your eyes, nose, and mouth with unwashed hands.   Prevention Steps for Caregivers and Household Members of Individuals Confirmed to have, or Being Evaluated for, COVID-19 Infection Being Cared for in the Home  If you live with, or provide care at home for, a person confirmed to have, or being evaluated for, COVID-19 infection please follow these guidelines to prevent infection:  Follow healthcare providers instructions Make sure that you understand and can help the patient  follow any healthcare provider instructions for all care.  Provide for the patients basic needs You should help the patient with basic needs in the home and provide support for getting groceries, prescriptions, and other personal needs.  Monitor the patients symptoms If they are getting sicker, call his or her medical provider and tell them that the patient  has, or is being evaluated for, COVID-19 infection. This will help the healthcare providers office take steps to keep other people from getting infected. Ask the healthcare provider to call the local or state health department.  Limit the number of people who have contact with the patient If possible, have only one caregiver for the patient. Other household members should stay in another home or place of residence. If this is not possible, they should stay in another room, or be separated from the patient as much as possible. Use a separate bathroom, if available. Restrict visitors who do not have an essential need to be in the home.  Keep older adults, very young children, and other sick people away from the patient Keep older adults, very young children, and those who have compromised immune systems or chronic health conditions away from the patient. This includes people with chronic heart, lung, or kidney conditions, diabetes, and cancer.  Ensure good ventilation Make sure that shared spaces in the home have good air flow, such as from an air conditioner or an opened window, weather permitting.  Wash your hands often Wash your hands often and thoroughly with soap and water for at least 20 seconds. You can use an alcohol based hand sanitizer if soap and water are not available and if your hands are not visibly dirty. Avoid touching your eyes, nose, and mouth with unwashed hands. Use disposable paper towels to dry your hands. If not available, use dedicated cloth towels and replace them when they become wet.  Wear a facemask and gloves Wear a disposable facemask at all times in the room and gloves when you touch or have contact with the patients blood, body fluids, and/or secretions or excretions, such as sweat, saliva, sputum, nasal mucus, vomit, urine, or feces.  Ensure the mask fits over your nose and mouth tightly, and do not touch it during use. Throw out disposable facemasks and  gloves after using them. Do not reuse. Wash your hands immediately after removing your facemask and gloves. If your personal clothing becomes contaminated, carefully remove clothing and launder. Wash your hands after handling contaminated clothing. Place all used disposable facemasks, gloves, and other waste in a lined container before disposing them with other household waste. Remove gloves and wash your hands immediately after handling these items.  Do not share dishes, glasses, or other household items with the patient Avoid sharing household items. You should not share dishes, drinking glasses, cups, eating utensils, towels, bedding, or other items with a patient who is confirmed to have, or being evaluated for, COVID-19 infection. After the person uses these items, you should wash them thoroughly with soap and water.  Wash laundry thoroughly Immediately remove and wash clothes or bedding that have blood, body fluids, and/or secretions or excretions, such as sweat, saliva, sputum, nasal mucus, vomit, urine, or feces, on them. Wear gloves when handling laundry from the patient. Read and follow directions on labels of laundry or clothing items and detergent. In general, wash and dry with the warmest temperatures recommended on the label.  Clean all areas the individual has used often Clean all touchable surfaces, such as  counters, tabletops, doorknobs, bathroom fixtures, toilets, phones, keyboards, tablets, and bedside tables, every day. Also, clean any surfaces that may have blood, body fluids, and/or secretions or excretions on them. Wear gloves when cleaning surfaces the patient has come in contact with. Use a diluted bleach solution (e.g., dilute bleach with 1 part bleach and 10 parts water) or a household disinfectant with a label that says EPA-registered for coronaviruses. To make a bleach solution at home, add 1 tablespoon of bleach to 1 quart (4 cups) of water. For a larger supply, add   cup of bleach to 1 gallon (16 cups) of water. Read labels of cleaning products and follow recommendations provided on product labels. Labels contain instructions for safe and effective use of the cleaning product including precautions you should take when applying the product, such as wearing gloves or eye protection and making sure you have good ventilation during use of the product. Remove gloves and wash hands immediately after cleaning.  Monitor yourself for signs and symptoms of illness Caregivers and household members are considered close contacts, should monitor their health, and will be asked to limit movement outside of the home to the extent possible. Follow the monitoring steps for close contacts listed on the symptom monitoring form.   ? If you have additional questions, contact your local health department or call the epidemiologist on call at [phone] (available 24/7). ? This guidance is subject to change. For the most up-to-date guidance from [facility], please refer to their website: [email or web address].hu

## 2020-03-09 NOTE — ED Provider Notes (Signed)
[facility] DEPT Provider Note   CSN: [id number] Arrival date & time: 03/09/20  5638     History Chief Complaint  Patient presents with  . Fatigue  . Generalized Body Aches  . Cough  . head aches  . Fever    Jonathan [name] is a 40 y.o. male hx of DM on insulin, here with headache, fever.  Patient states that his cousin visited him last week.  Since yesterday he developed some mild headaches and fever and nonproductive cough.  Patient states that he is fully vaccinated against COVID.  He was noted to have low-grade temperature and mild tachycardia but no hypoxia in triage.  Patient states that he is diabetic and is on insulin.  He has no underlying lung problems  The history is provided by the patient.       Past Medical History:  Diagnosis Date  . Amputation of left great toe ([place]) 01/2019  . Anxiety   . Decreased libido 04/2019  . Diabetes mellitus   . Erectile dysfunction 04/2019  . Hyperglycemia 09/2019  . Low testosterone in male 04/2019  . Proteinuria 01/07/2020  . Vitamin D deficiency 04/2019    Patient Active Problem List   Diagnosis Date Noted  . Hypogonadotropic hypogonadism ([place]) 07/17/2019  . Hemoglobin A1C between 7% and 9% indicating borderline diabetic control 04/29/2019  . Decreased libido 04/29/2019  . Low testosterone in male 04/2019  . Non-pressure chronic ulcer of other part of left foot with necrosis of bone ([place])   . Cutaneous abscess of left foot   . Hemoglobin A1C greater than 9%, indicating poor diabetic control 08/27/2018  . Amputation of left great toe ([place]) 08/27/2018  . Osteomyelitis of great toe of left foot ([place]) 01/30/2018  . Osteomyelitis ([place]) 01/30/2018  . Depression with anxiety 01/30/2018  . Major depressive disorder, recurrent severe without psychotic features ([place]) 07/16/2017  . OD (overdose of drug), intentional self-harm, initial encounter ([place]) 07/15/2017  . Cellulitis 12/26/2016  . Open toe  wound, subsequent encounter 12/12/2016  . Hyperglycemia 12/06/2016  . Diabetic ulcer of left foot ([place]) 12/05/2016  . Diabetic neuropathy ([place]) 10/16/2015  . Erectile dysfunction 08/05/2015  . Low testosterone 09/12/2014  . DM type 2, uncontrolled, with neuropathy ([place]) 06/12/2014  . Diabetes mellitus, type II ([place]) 03/23/2012  . Noncompliance 03/23/2012  . Gastroenteritis and colitis, viral 03/23/2012    Past Surgical History:  Procedure Laterality Date  . AMPUTATION Left 02/02/2018   Procedure: LEFT GREAT TOE AMPUTATION;  Surgeon: Newt Minion, MD;  Location: [facility];  Service: Orthopedics;  Laterality: Left;  . AMPUTATION Left 02/08/2019   Procedure: LEFT FOOT 1ST RAY AMPUTATION;  Surgeon: Newt Minion, MD;  Location: [facility];  Service: Orthopedics;  Laterality: Left;       Family History  Problem Relation Age of Onset  . Other Mother        Healthy  . Other Father        Healthy    Social History   Tobacco Use  . Smoking status: Never Smoker  . Smokeless tobacco: Never Used  Vaping Use  . Vaping Use: Never used  Substance Use Topics  . Alcohol use: Yes    Alcohol/week: 2.0 standard drinks    Types: 1 Glasses of wine, 1 Cans of beer per week    Comment: occassional  . Drug use: No    Home Medications Prior to Admission medications   Medication Sig Start Date End Date  Taking? Authorizing Provider  atorvastatin (LIPITOR) 10 MG tablet Take 1 tablet (10 mg total) by mouth daily. 01/07/20   Azzie Glatter, FNP  blood glucose meter kit and supplies Dispense based on patient and insurance preference. Use up to four times daily as directed. (FOR ICD-10 E10.9, E11.9). 02/03/18   Shelly Coss, MD  Blood Glucose Monitoring Suppl (ONETOUCH VERIO) w/Device KIT Inject 1 Units into the skin QID. Use meter 4 times daily as directed 05/17/19   Azzie Glatter, FNP  clomiPHENE (CLOMID) 50 MG tablet Take 1 tablet (50 mg total) by mouth daily. 02/05/20   Shamleffer, Melanie Crazier, MD  gabapentin (NEURONTIN) 300 MG capsule Take 1 capsule (300 mg total) by mouth at bedtime. 01/07/20   Azzie Glatter, FNP  glucose blood [facility] VERIO) test strip Use as instructed 01/07/20   Azzie Glatter, FNP  ibuprofen (ADVIL) 800 MG tablet Take 1 tablet (800 mg total) by mouth every 8 (eight) hours as needed. 01/07/20   Azzie Glatter, FNP  insulin glargine (LANTUS SOLOSTAR) 100 UNIT/ML Solostar Pen Inject 50 Units into the skin daily. 01/07/20   Azzie Glatter, FNP  Insulin Lispro Prot & Lispro (HUMALOG MIX 75/25 KWIKPEN) (75-25) 100 UNIT/ML Kwikpen INJECT 35 UNITS INTO THE SKIN 2 (TWO) TIMES DAILY. 01/07/20   Azzie Glatter, FNP  Lancets [facility] ULTRASOFT) lancets Use 4 times daily as directed  E11.42, Z79.4 01/07/20   Azzie Glatter, FNP  liraglutide (VICTOZA) 18 MG/3ML SOPN Start 0.62m SQ once a day for 7 days, then increase to 1.246monce a day 01/07/20   StAzzie GlatterFNP  sildenafil (VIAGRA) 100 MG tablet Take 1 tablet (100 mg total) by mouth daily as needed for erectile dysfunction. 01/07/20   StAzzie GlatterFNP  Vitamin D, Ergocalciferol, (DRISDOL) 1.25 MG (50000 UNIT) CAPS capsule Take 1 capsule (50,000 Units total) by mouth every 7 (seven) days. 08/08/19   StAzzie GlatterFNP    Allergies    Patient has no known allergies.  Review of Systems   Review of Systems  Constitutional: Positive for fever.  Respiratory: Positive for cough.   All other systems reviewed and are negative.   Physical Exam Updated Vital Signs BP (!) 150/85 (BP Location: Left Arm)   Pulse (!) 103   Temp (!) 100.9 F (38.3 C) (Oral)   Resp 18   Ht _0  (2.032 m)   Wt 132.6 kg   SpO2 99%   BMI 32.12 kg/m   Physical Exam Vitals and nursing note reviewed.  Constitutional:      Appearance: Normal appearance.  HENT:     Head: Normocephalic.     Nose: Nose normal.     Mouth/Throat:     Mouth: Mucous membranes are moist.  Eyes:     Extraocular Movements:  Extraocular movements intact.     Pupils: Pupils are equal, round, and reactive to light.  Cardiovascular:     Rate and Rhythm: Normal rate and regular rhythm.     Pulses: Normal pulses.     Heart sounds: Normal heart sounds.  Pulmonary:     Effort: Pulmonary effort is normal.     Breath sounds: Normal breath sounds.  Abdominal:     General: Abdomen is flat.     Palpations: Abdomen is soft.  Musculoskeletal:        General: Normal range of motion.     Cervical back: Normal range of motion and neck supple.  Skin:    General: Skin is warm.     Capillary Refill: Capillary refill takes less than 2 seconds.  Neurological:     General: No focal deficit present.     Mental Status: He is alert and oriented to person, place, and time.  Psychiatric:        Mood and Affect: Mood normal.        Behavior: Behavior normal.     ED Results / Procedures / Treatments   Labs (all labs ordered are listed, but only abnormal results are displayed) Labs Reviewed  SARS CORONAVIRUS 2 (TAT 6-24 HRS)  CBG MONITORING, ED    EKG None  Radiology No results found.  Procedures Procedures (including critical care time)  Medications Ordered in ED Medications  acetaminophen (TYLENOL) tablet 650 mg (has no administration in time range)    ED Course  I have reviewed the triage vital signs and the nursing notes.  Pertinent labs & imaging results that were available during my care of the patient were reviewed by me and considered in my medical decision making (see chart for details).    MDM Rules/Calculators/A&P                         Jonathan [name] is a 40 y.o. male here with cough, fever.  Patient is fully vaccinated and gets COVID patient has low-grade temp in the ED but no hypoxia.  Patient is well-appearing.  COVID test is pending in the ED.  Tell him to stay home until his COVID test comes back.  Stable for discharge  Jonathan [name] was evaluated in Emergency Department on 03/09/2020 for  the symptoms described in the history of present illness. He was evaluated in the context of the global COVID-19 pandemic, which necessitated consideration that the patient might be at risk for infection with the SARS-CoV-2 virus that causes COVID-19. Institutional protocols and algorithms that pertain to the evaluation of patients at risk for COVID-19 are in a state of rapid change based on information released by regulatory bodies including the CDC and federal and state organizations. These policies and algorithms were followed during the patient's care in the ED.   Final Clinical Impression(s) / ED Diagnoses Final diagnoses:  None    Rx / DC Orders ED Discharge Orders    None       Drenda Freeze, MD 03/09/20 [phone]

## 2020-03-31 MED FILL — SILDENAFIL CITRATE 100 MG T: 100 | 30 days supply | Qty: 6 | Fill #0

## 2020-03-31 MED FILL — LANTUS SOLOSTAR 100 UNITS/M: 100 | 30 days supply | Qty: 15 | Fill #2

## 2020-03-31 MED FILL — ATORVASTATIN 10 MG TABLET: 10 | 30 days supply | Qty: 30 | Fill #1

## 2020-03-31 MED FILL — HUMALOG MIX 75-25 KWIKPEN: (75-25) 100 | 25 days supply | Qty: 18 | Fill #2

## 2020-03-31 MED FILL — GABAPENTIN 300 MG CAPSULE: 300 | 30 days supply | Qty: 30 | Fill #2

## 2020-04-24 ENCOUNTER — Other Ambulatory Visit: Payer: Self-pay | Admitting: Internal Medicine

## 2020-04-28 ENCOUNTER — Other Ambulatory Visit: Payer: Self-pay | Admitting: Internal Medicine

## 2020-05-05 MED FILL — LANTUS SOLOSTAR 100 UNITS/M: 100 | 30 days supply | Qty: 15 | Fill #3

## 2020-05-05 MED FILL — ATORVASTATIN 10 MG TABLET: 10 | 30 days supply | Qty: 30 | Fill #2

## 2020-05-05 MED FILL — HUMALOG MIX 75-25 KWIKPEN: (75-25) 100 | 25 days supply | Qty: 18 | Fill #3

## 2020-05-25 ENCOUNTER — Encounter: Payer: Self-pay | Admitting: Family Medicine

## 2020-05-26 ENCOUNTER — Other Ambulatory Visit: Payer: Self-pay

## 2020-05-26 ENCOUNTER — Ambulatory Visit (INDEPENDENT_AMBULATORY_CARE_PROVIDER_SITE_OTHER): Payer: 59 | Admitting: Family Medicine

## 2020-05-26 ENCOUNTER — Encounter: Payer: Self-pay | Admitting: Family Medicine

## 2020-05-26 ENCOUNTER — Other Ambulatory Visit: Payer: Self-pay | Admitting: Family Medicine

## 2020-05-26 VITALS — BP 136/106 | HR 88 | Temp 97.7°F | Ht >= 80 in | Wt 302.0 lb

## 2020-05-26 DIAGNOSIS — R739 Hyperglycemia, unspecified: Secondary | ICD-10-CM

## 2020-05-26 DIAGNOSIS — E118 Type 2 diabetes mellitus with unspecified complications: Secondary | ICD-10-CM | POA: Diagnosis not present

## 2020-05-26 DIAGNOSIS — R7309 Other abnormal glucose: Secondary | ICD-10-CM

## 2020-05-26 DIAGNOSIS — F419 Anxiety disorder, unspecified: Secondary | ICD-10-CM

## 2020-05-26 DIAGNOSIS — Z794 Long term (current) use of insulin: Secondary | ICD-10-CM

## 2020-05-26 DIAGNOSIS — R7989 Other specified abnormal findings of blood chemistry: Secondary | ICD-10-CM

## 2020-05-26 DIAGNOSIS — Z0000 Encounter for general adult medical examination without abnormal findings: Secondary | ICD-10-CM | POA: Diagnosis not present

## 2020-05-26 DIAGNOSIS — Z09 Encounter for follow-up examination after completed treatment for conditions other than malignant neoplasm: Secondary | ICD-10-CM

## 2020-05-26 LAB — POCT URINALYSIS DIPSTICK
Bilirubin, UA: NEGATIVE
Glucose, UA: POSITIVE — AB
Ketones, UA: NEGATIVE
Leukocytes, UA: NEGATIVE
Nitrite, UA: NEGATIVE
Protein, UA: NEGATIVE
Spec Grav, UA: 1.025 (ref 1.010–1.025)
Urobilinogen, UA: 0.2 E.U./dL
pH, UA: 5.5 (ref 5.0–8.0)

## 2020-05-26 MED ORDER — LANTUS SOLOSTAR 100 UNIT/ML ~~LOC~~ SOPN: 50.0000 [IU] | PEN_INJECTOR | Freq: Every day | SUBCUTANEOUS | 11 refills | Status: DC

## 2020-05-26 NOTE — Progress Notes (Signed)
Patient [facility]   Annual Physical   Subjective:  Patient ID: Jonathan [name], male    DOB: Dec 13, 1980  Age: 40 y.o. MRN: [id number]  CC:  Chief Complaint  Patient presents with  . Follow-up    Follow up , insulin isn't help lower his number    HPI Jonathan [name] is a 40 year old male who presents for his Annual Physical today.   Patient Active Problem List   Diagnosis Date Noted  . Hypogonadotropic hypogonadism ([place]) 07/17/2019  . Hemoglobin A1C between 7% and 9% indicating borderline diabetic control 04/29/2019  . Decreased libido 04/29/2019  . Low testosterone in male 04/2019  . Non-pressure chronic ulcer of other part of left foot with necrosis of bone ([place])   . Cutaneous abscess of left foot   . Hemoglobin A1C greater than 9%, indicating poor diabetic control 08/27/2018  . Amputation of left great toe ([place]) 08/27/2018  . Osteomyelitis of great toe of left foot ([place]) 01/30/2018  . Osteomyelitis ([place]) 01/30/2018  . Depression with anxiety 01/30/2018  . Major depressive disorder, recurrent severe without psychotic features ([place]) 07/16/2017  . OD (overdose of drug), intentional self-harm, initial encounter ([place]) 07/15/2017  . Cellulitis 12/26/2016  . Open toe wound, subsequent encounter 12/12/2016  . Hyperglycemia 12/06/2016  . Diabetic ulcer of left foot ([place]) 12/05/2016  . Diabetic neuropathy ([place]) 10/16/2015  . Erectile dysfunction 08/05/2015  . Low testosterone 09/12/2014  . DM type 2, uncontrolled, with neuropathy ([place]) 06/12/2014  . Diabetes mellitus, type II ([place]) 03/23/2012  . Noncompliance 03/23/2012  . Gastroenteritis and colitis, viral 03/23/2012   Current Status: Since his last office visit, he is doing well with no complaints. His most recent normal range of preprandial blood glucose levels have been between 120-130. He has seen low range of 89 and high of 300 since his last office visit. He denies fatigue,  frequent urination, blurred vision, excessive hunger, excessive thirst, weight gain, weight loss, and poor wound healing. He continues to check his feet regularly. He denies fevers, chills, fatigue, recent infections, weight loss, and night sweats. He has not had any headaches, visual changes, dizziness, and falls. No chest pain, heart palpitations, cough and shortness of breath reported. Denies GI problems such as nausea, vomiting, diarrhea, and constipation. He has no reports of blood in stools, dysuria and hematuria. No depression or anxiety reported today. He is taking all medications as prescribed. He denies pain today.   Past Medical History:  Diagnosis Date  . Amputation of left great toe ([place]) 01/2019  . Anxiety   . Decreased libido 04/2019  . Diabetes mellitus   . Erectile dysfunction 04/2019  . Hyperglycemia 09/2019  . Low testosterone in male 04/2019  . Proteinuria 01/07/2020  . Vitamin D deficiency 04/2019    Past Surgical History:  Procedure Laterality Date  . AMPUTATION Left 02/02/2018   Procedure: LEFT GREAT TOE AMPUTATION;  Surgeon: Newt Minion, MD;  Location: [facility];  Service: Orthopedics;  Laterality: Left;  . AMPUTATION Left 02/08/2019   Procedure: LEFT FOOT 1ST RAY AMPUTATION;  Surgeon: Newt Minion, MD;  Location: [facility];  Service: Orthopedics;  Laterality: Left;    Family History  Problem Relation Age of Onset  . Other Mother        Healthy  . Other Father        Healthy    Social History   Socioeconomic History  . Marital status: Single  Spouse name: Not on file  . Number of children: Not on file  . Years of education: Not on file  . Highest education level: Not on file  Occupational History  . Not on file  Tobacco Use  . Smoking status: Never Smoker  . Smokeless tobacco: Never Used  Vaping Use  . Vaping Use: Never used  Substance and Sexual Activity  . Alcohol use: Yes    Alcohol/week: 2.0 standard drinks    Types: 1 Glasses of wine, 1 Cans  of beer per week    Comment: occassional  . Drug use: No  . Sexual activity: Yes  Other Topics Concern  . Not on file  Social History Narrative  . Not on file   Social Determinants of Health   Financial Resource Strain: Not on file  Food Insecurity: Not on file  Transportation Needs: Not on file  Physical Activity: Not on file  Stress: Not on file  Social Connections: Not on file  Intimate Partner Violence: Not on file    Outpatient Medications Prior to Visit  Medication Sig Dispense Refill  . atorvastatin (LIPITOR) 10 MG tablet Take 1 tablet (10 mg total) by mouth daily. 30 tablet 11  . blood glucose meter kit and supplies Dispense based on patient and insurance preference. Use up to four times daily as directed. (FOR ICD-10 E10.9, E11.9). 1 each 0  . Blood Glucose Monitoring Suppl (ONETOUCH VERIO) w/Device KIT Inject 1 Units into the skin QID. Use meter 4 times daily as directed 1 kit 0  . clomiPHENE (CLOMID) 50 MG tablet Take 1 tablet by mouth once daily 30 tablet 0  . gabapentin (NEURONTIN) 300 MG capsule Take 1 capsule (300 mg total) by mouth at bedtime. 30 capsule 11  . glucose blood (ONETOUCH VERIO) test strip Use as instructed 100 each 11  . Insulin Lispro Prot & Lispro (HUMALOG MIX 75/25 KWIKPEN) (75-25) 100 UNIT/ML Kwikpen INJECT 35 UNITS INTO THE SKIN 2 (TWO) TIMES DAILY. 20 mL 11  . Lancets (ONETOUCH ULTRASOFT) lancets Use 4 times daily as directed  E11.42, Z79.4 100 each 11  . liraglutide (VICTOZA) 18 MG/3ML SOPN Start 0.$RemoveBefo'6mg'nqKhCVvNogY$  SQ once a day for 7 days, then increase to 1.$RemoveBefor'2mg'yzgAHvzknmDd$  once a day 3 mL 11  . sildenafil (VIAGRA) 100 MG tablet Take 1 tablet (100 mg total) by mouth daily as needed for erectile dysfunction. 10 tablet 6  . insulin glargine (LANTUS SOLOSTAR) 100 UNIT/ML Solostar Pen Inject 50 Units into the skin daily. 15 mL 11  . Vitamin D, Ergocalciferol, (DRISDOL) 1.25 MG (50000 UNIT) CAPS capsule Take 1 capsule (50,000 Units total) by mouth every 7 (seven) days. 5  capsule 6  . ibuprofen (ADVIL) 800 MG tablet Take 1 tablet (800 mg total) by mouth every 8 (eight) hours as needed. (Patient not taking: Reported on 05/26/2020) 30 tablet 6   No facility-administered medications prior to visit.    No Known Allergies  ROS Review of Systems  Constitutional: Negative.   HENT: Negative.   Eyes: Negative.   Respiratory: Negative.   Cardiovascular: Negative.   Gastrointestinal: Negative.   Endocrine: Negative.   Genitourinary: Negative.   Musculoskeletal: Negative.   Allergic/Immunologic: Negative.   Neurological: Negative.   Hematological: Negative.   Psychiatric/Behavioral: Negative.       Objective:    Physical Exam Vitals and nursing note reviewed.  Constitutional:      Appearance: Normal appearance.  HENT:     Head: Normocephalic and atraumatic.  Nose: Nose normal.     Mouth/Throat:     Mouth: Mucous membranes are moist.     Pharynx: Oropharynx is clear.  Cardiovascular:     Rate and Rhythm: Normal rate and regular rhythm.     Pulses: Normal pulses.     Heart sounds: Normal heart sounds.  Pulmonary:     Effort: Pulmonary effort is normal.     Breath sounds: Normal breath sounds.  Abdominal:     General: Bowel sounds are normal.     Palpations: Abdomen is soft.  Musculoskeletal:        General: Normal range of motion.     Cervical back: Normal range of motion and neck supple.  Skin:    General: Skin is warm and dry.  Neurological:     General: No focal deficit present.     Mental Status: He is alert and oriented to person, place, and time.  Psychiatric:        Mood and Affect: Mood normal.        Behavior: Behavior normal.        Thought Content: Thought content normal.        Judgment: Judgment normal.     BP (!) 136/106 (BP Location: Left Arm, Patient Position: Sitting, Cuff Size: Large)   Pulse 88   Temp 97.7 F (36.5 C) (Temporal)   Ht $R'6\' 8"'MP$  (2.032 m)   Wt (!) 302 lb (137 kg)   SpO2 99%   BMI 33.18 kg/m  Wt  Readings from Last 3 Encounters:  05/26/20 (!) 302 lb (137 kg)  03/09/20 292 lb 6 oz (132.6 kg)  02/05/20 286 lb 8 oz (130 kg)     Health Maintenance Due  Topic Date Due  . Hepatitis C Screening  Never done  . PNEUMOCOCCAL POLYSACCHARIDE VACCINE AGE 52-64 HIGH RISK  Never done  . COVID-19 Vaccine (1) Never done  . FOOT EXAM  10/15/2016    There are no preventive care reminders to display for this patient.  Lab Results  Component Value Date   TSH 1.380 05/26/2020   Lab Results  Component Value Date   WBC 5.4 05/26/2020   HGB 15.1 05/26/2020   HCT 47.8 05/26/2020   MCV 79 05/26/2020   PLT 166 05/26/2020   Lab Results  Component Value Date   NA 140 05/26/2020   K 4.2 05/26/2020   CO2 22 05/26/2020   GLUCOSE 196 (H) 05/26/2020   BUN 12 05/26/2020   CREATININE 1.03 05/26/2020   BILITOT <0.2 05/26/2020   ALKPHOS 85 05/26/2020   AST 30 05/26/2020   ALT 43 05/26/2020   PROT 6.9 05/26/2020   ALBUMIN 4.2 05/26/2020   CALCIUM 9.7 05/26/2020   ANIONGAP 11 03/10/2018   GFR 96.95 02/05/2020   Lab Results  Component Value Date   CHOL 119 05/26/2020   Lab Results  Component Value Date   HDL 40 05/26/2020   Lab Results  Component Value Date   LDLCALC 51 05/26/2020   Lab Results  Component Value Date   TRIG 165 (H) 05/26/2020   Lab Results  Component Value Date   CHOLHDL 3.0 05/26/2020   Lab Results  Component Value Date   HGBA1C 9.8 (A) 01/07/2020   HGBA1C 9.8 01/07/2020   HGBA1C 9.8 (A) 01/07/2020   HGBA1C 9.8 (A) 01/07/2020      Assessment & Plan:   1. Annual physical exam Physical assessment within normal for age. Basic Neurology assessment normal.   Recommend  monthly self testicular exam Recommend daily multivitamin for men Recommend strength training in 150 minutes of cardiovascular exercise per week  2. Type 2 diabetes mellitus with complication, with long-term current use of insulin ([place]) He will continue medication as prescribed, to decrease  foods/beverages high in sugars and carbs and follow Heart Healthy or DASH diet. Increase physical activity to at least 30 minutes cardio exercise daily.  - POCT glycosylated hemoglobin (Hb A1C) - POCT Urinalysis Dipstick - insulin glargine (LANTUS SOLOSTAR) 100 UNIT/ML Solostar Pen; Inject 50 Units into the skin at bedtime.  Dispense: 15 mL; Refill: 11  3. Hemoglobin A1C greater than 9%, indicating poor diabetic control Most recent Hgb A1c at 9.8. Monitor. - insulin glargine (LANTUS SOLOSTAR) 100 UNIT/ML Solostar Pen; Inject 50 Units into the skin at bedtime.  Dispense: 15 mL; Refill: 11  4. Hyperglycemia - insulin glargine (LANTUS SOLOSTAR) 100 UNIT/ML Solostar Pen; Inject 50 Units into the skin at bedtime.  Dispense: 15 mL; Refill: 11  5. Healthcare maintenance - CBC with Differential - Comprehensive metabolic panel - Lipid Panel - TSH - Vitamin B12 - Vitamin D, 25-hydroxy  6. Anxiety  7. Low testosterone Improved. He will continue to follow up with Endocrinology as needed.   8. Follow up He will follow up in 6 months.   Meds ordered this encounter  Medications  . insulin glargine (LANTUS SOLOSTAR) 100 UNIT/ML Solostar Pen    Sig: Inject 50 Units into the skin at bedtime.    Dispense:  15 mL    Refill:  11    Orders Placed This Encounter  Procedures  . CBC with Differential  . Comprehensive metabolic panel  . Lipid Panel  . TSH  . Vitamin B12  . Vitamin D, 25-hydroxy  . POCT glycosylated hemoglobin (Hb A1C)  . POCT Urinalysis Dipstick    Referral Orders  No referral(s) requested today    Kathe Becton, MSN, ANE, FNP-BC [facility] [address] [place], [place] [address] [phone] fax  Problem List Items Addressed This Visit      Endocrine   Hemoglobin A1C greater than 9%, indicating poor diabetic control   Relevant Medications   insulin glargine  (LANTUS SOLOSTAR) 100 UNIT/ML Solostar Pen     Other   Hyperglycemia   Relevant Medications   insulin glargine (LANTUS SOLOSTAR) 100 UNIT/ML Solostar Pen   Low testosterone in male    Other Visit Diagnoses    Annual physical exam    -  Primary   Type 2 diabetes mellitus with complication, with long-term current use of insulin (HCC)       Relevant Medications   insulin glargine (LANTUS SOLOSTAR) 100 UNIT/ML Solostar Pen   Other Relevant Orders   POCT glycosylated hemoglobin (Hb A1C)   POCT Urinalysis Dipstick (Completed)   Healthcare maintenance       Relevant Orders   CBC with Differential (Completed)   Comprehensive metabolic panel (Completed)   Lipid Panel (Completed)   TSH (Completed)   Vitamin B12 (Completed)   Vitamin D, 25-hydroxy (Completed)   Anxiety       Follow up          Meds ordered this encounter  Medications  . insulin glargine (LANTUS SOLOSTAR) 100 UNIT/ML Solostar Pen    Sig: Inject 50 Units into the skin at bedtime.    Dispense:  15 mL    Refill:  11    Follow-up: No  follow-ups on file.    Azzie Glatter, FNP

## 2020-05-27 ENCOUNTER — Other Ambulatory Visit: Payer: Self-pay | Admitting: Family Medicine

## 2020-05-27 DIAGNOSIS — E559 Vitamin D deficiency, unspecified: Secondary | ICD-10-CM

## 2020-05-27 LAB — COMPREHENSIVE METABOLIC PANEL
ALT: 43 IU/L (ref 0–44)
AST: 30 IU/L (ref 0–40)
Albumin/Globulin Ratio: 1.6 (ref 1.2–2.2)
Albumin: 4.2 g/dL (ref 4.0–5.0)
Alkaline Phosphatase: 85 IU/L (ref 44–121)
BUN/Creatinine Ratio: 12 (ref 9–20)
BUN: 12 mg/dL (ref 6–20)
Bilirubin Total: <0.2 mg/dL (ref 0.0–1.2)
CO2: 22 mmol/L (ref 20–29)
Calcium: 9.7 mg/dL (ref 8.7–10.2)
Chloride: 104 mmol/L (ref 96–106)
Creatinine, Ser: 1.03 mg/dL (ref 0.76–1.27)
Globulin, Total: 2.7 g/dL (ref 1.5–4.5)
Glucose: 196 mg/dL — ABNORMAL HIGH (ref 65–99)
Potassium: 4.2 mmol/L (ref 3.5–5.2)
Sodium: 140 mmol/L (ref 134–144)
Total Protein: 6.9 g/dL (ref 6.0–8.5)
eGFR: 95 mL/min/{1.73_m2} (ref >59)

## 2020-05-27 LAB — CBC WITH DIFFERENTIAL/PLATELET
Basophils Absolute: 0 10*3/uL (ref 0.0–0.2)
Basos: 0 %
EOS (ABSOLUTE): 0.1 10*3/uL (ref 0.0–0.4)
Eos: 1 %
Hematocrit: 47.8 % (ref 37.5–51.0)
Hemoglobin: 15.1 g/dL (ref 13.0–17.7)
Immature Grans (Abs): 0 10*3/uL (ref 0.0–0.1)
Immature Granulocytes: 0 %
Lymphocytes Absolute: 2.3 10*3/uL (ref 0.7–3.1)
Lymphs: 43 %
MCH: 24.9 pg — ABNORMAL LOW (ref 26.6–33.0)
MCHC: 31.6 g/dL (ref 31.5–35.7)
MCV: 79 fL (ref 79–97)
Monocytes Absolute: 0.4 10*3/uL (ref 0.1–0.9)
Monocytes: 7 %
Neutrophils Absolute: 2.7 10*3/uL (ref 1.4–7.0)
Neutrophils: 49 %
Platelets: 166 10*3/uL (ref 150–450)
RBC: 6.07 x10E6/uL — ABNORMAL HIGH (ref 4.14–5.80)
RDW: 13.8 % (ref 11.6–15.4)
WBC: 5.4 10*3/uL (ref 3.4–10.8)

## 2020-05-27 LAB — LIPID PANEL
Chol/HDL Ratio: 3 ratio (ref 0.0–5.0)
Cholesterol, Total: 119 mg/dL (ref 100–199)
HDL: 40 mg/dL (ref >39)
LDL Chol Calc (NIH): 51 mg/dL (ref 0–99)
Triglycerides: 165 mg/dL — ABNORMAL HIGH (ref 0–149)
VLDL Cholesterol Cal: 28 mg/dL (ref 5–40)

## 2020-05-27 LAB — VITAMIN B12: Vitamin B-12: 673 pg/mL (ref 232–1245)

## 2020-05-27 LAB — TSH: TSH: 1.38 u[IU]/mL (ref 0.450–4.500)

## 2020-05-27 LAB — VITAMIN D 25 HYDROXY (VIT D DEFICIENCY, FRACTURES): Vit D, 25-Hydroxy: 18.9 ng/mL — ABNORMAL LOW (ref 30.0–100.0)

## 2020-05-27 MED ORDER — VITAMIN D (ERGOCALCIFEROL) 1.25 MG (50000 UNIT) PO CAPS: 50000.0000 [IU] | ORAL_CAPSULE | ORAL | 2 refills | Status: DC

## 2020-05-28 ENCOUNTER — Encounter: Payer: Self-pay | Admitting: Family Medicine

## 2020-05-30 ENCOUNTER — Other Ambulatory Visit: Payer: Self-pay

## 2020-05-30 MED FILL — Insulin Glargine Soln Pen-Injector 100 Unit/ML: SUBCUTANEOUS | 30 days supply | Qty: 15 | Fill #0 | Status: CN

## 2020-05-30 MED FILL — Insulin Lispro Prot & Lispro Sus Pen-inj 100 Unit/ML (75-25): SUBCUTANEOUS | 25 days supply | Qty: 18 | Fill #0 | Status: CN

## 2020-05-30 MED FILL — Ergocalciferol Cap 1.25 MG (50000 Unit): ORAL | 28 days supply | Qty: 4 | Fill #0 | Status: CN

## 2020-05-31 ENCOUNTER — Other Ambulatory Visit: Payer: Self-pay

## 2020-05-31 MED FILL — Gabapentin Cap 300 MG: ORAL | 30 days supply | Qty: 30 | Fill #0 | Status: CN

## 2020-06-08 ENCOUNTER — Other Ambulatory Visit: Payer: Self-pay

## 2020-06-08 MED FILL — Insulin Glargine Soln Pen-Injector 100 Unit/ML: SUBCUTANEOUS | 30 days supply | Qty: 15 | Fill #0 | Status: CN

## 2020-06-08 MED FILL — Liraglutide Soln Pen-injector 18 MG/3ML (6 MG/ML): SUBCUTANEOUS | 30 days supply | Qty: 6 | Fill #0 | Status: CN

## 2020-06-08 MED FILL — Gabapentin Cap 300 MG: ORAL | 30 days supply | Qty: 30 | Fill #0 | Status: CN

## 2020-06-08 MED FILL — Atorvastatin Calcium Tab 10 MG (Base Equivalent): ORAL | 30 days supply | Qty: 30 | Fill #0 | Status: CN

## 2020-06-08 MED FILL — Insulin Lispro Prot & Lispro Sus Pen-inj 100 Unit/ML (75-25): SUBCUTANEOUS | 21 days supply | Qty: 15 | Fill #0 | Status: CN

## 2020-06-09 ENCOUNTER — Other Ambulatory Visit: Payer: Self-pay

## 2020-06-10 ENCOUNTER — Other Ambulatory Visit: Payer: Self-pay

## 2020-06-11 ENCOUNTER — Other Ambulatory Visit: Payer: Self-pay

## 2020-06-16 ENCOUNTER — Other Ambulatory Visit: Payer: Self-pay

## 2020-06-18 ENCOUNTER — Other Ambulatory Visit: Payer: Self-pay

## 2020-06-24 ENCOUNTER — Other Ambulatory Visit: Payer: Self-pay

## 2020-07-01 ENCOUNTER — Other Ambulatory Visit: Payer: Self-pay

## 2020-07-01 MED FILL — Atorvastatin Calcium Tab 10 MG (Base Equivalent): ORAL | 30 days supply | Qty: 30 | Fill #0 | Status: AC

## 2020-07-01 MED FILL — Insulin Glargine Soln Pen-Injector 100 Unit/ML: SUBCUTANEOUS | 30 days supply | Qty: 15 | Fill #0 | Status: AC

## 2020-07-01 MED FILL — Insulin Lispro Prot & Lispro Sus Pen-inj 100 Unit/ML (75-25): SUBCUTANEOUS | 30 days supply | Qty: 21 | Fill #0 | Status: AC

## 2020-07-01 MED FILL — Liraglutide Soln Pen-injector 18 MG/3ML (6 MG/ML): SUBCUTANEOUS | 30 days supply | Qty: 6 | Fill #0 | Status: AC

## 2020-07-06 ENCOUNTER — Ambulatory Visit: Payer: Self-pay | Admitting: Family Medicine

## 2020-07-06 ENCOUNTER — Ambulatory Visit: Payer: Self-pay | Admitting: Nurse Practitioner

## 2020-07-30 ENCOUNTER — Other Ambulatory Visit: Payer: Self-pay | Admitting: Nurse Practitioner

## 2020-07-30 MED ORDER — ONETOUCH VERIO VI STRP: ORAL_STRIP | 11 refills | Status: DC

## 2020-07-31 ENCOUNTER — Other Ambulatory Visit: Payer: Self-pay

## 2020-07-31 MED FILL — Liraglutide Soln Pen-injector 18 MG/3ML (6 MG/ML): SUBCUTANEOUS | 30 days supply | Qty: 6 | Fill #1 | Status: AC

## 2020-07-31 MED FILL — Atorvastatin Calcium Tab 10 MG (Base Equivalent): ORAL | 30 days supply | Qty: 30 | Fill #1 | Status: AC

## 2020-07-31 MED FILL — Ergocalciferol Cap 1.25 MG (50000 Unit): ORAL | 28 days supply | Qty: 4 | Fill #0 | Status: AC

## 2020-07-31 MED FILL — Insulin Lispro Prot & Lispro Sus Pen-inj 100 Unit/ML (75-25): SUBCUTANEOUS | 30 days supply | Qty: 21 | Fill #1 | Status: AC

## 2020-07-31 MED FILL — Insulin Glargine Soln Pen-Injector 100 Unit/ML: SUBCUTANEOUS | 30 days supply | Qty: 15 | Fill #1 | Status: AC

## 2020-08-03 ENCOUNTER — Other Ambulatory Visit: Payer: Self-pay

## 2020-08-04 ENCOUNTER — Other Ambulatory Visit: Payer: Self-pay

## 2020-08-12 ENCOUNTER — Ambulatory Visit: Payer: 59 | Admitting: Internal Medicine

## 2020-08-12 NOTE — Progress Notes (Deleted)
Name: Jonathan [name]  MRN/ DOB: [id number], 10/03/1980    Age/ Sex: 40 y.o., male     PCP: Azzie Glatter, FNP (Inactive)   Reason for Endocrinology Evaluation: Hypogonadism      Initial Endocrinology Clinic Visit: 07/15/2019      PATIENT IDENTIFIER: Jonathan [name] is a 40 y.o., male with a past medical history of T2DM, and Hypogonadism. He has followed with [facility] since 07/15/2019 for consultative assistance with management of his Hypogonadism .      HISTORICAL SUMMARY: The patient was first diagnosed with   hypogonadism in 2016. He has had multiple testosterone readings with a nadir of 125 ng/dL, as well as low sex hormone binding globulin. He was on Testosterone Cypionate at some point.   No hx of long term narcotic or illicit drug use.  No hx of head trauma  No of testicular childhood infection that he could recall.  No FH of prostate cancer    Testicular size on initial presentation 20 mL bilaterally, with a total testosterone of 167 ng/dL, inappropriately normal FSH and LH 3.4 and 7.54 mIU/mL respectively. Normal prolactin, TFT and cortisol.   MRI of brain did NOT show any pituitary pathology (08/22/2019)   He was prescribed Clomiphene 50 mg daily which was started 08/2019. Repeat testosterone increased from 155 ng/dL to 260 ng/dL.   MRI showed normal pituitary gland   SUBJECTIVE:   Today (08/12/2020):  Jonathan [name] is here for a follow up on hypogonadotropic hypogonadism  . He has been on Clomiphene for a few months without side effects   He has noted better energy level as well as erections but he continues with dry erections   Has normal libido     HISTORY:  Past Medical History:  Past Medical History:  Diagnosis Date   Amputation of left great toe ([place]) 01/2019   Anxiety    Decreased libido 04/2019   Diabetes mellitus    Erectile dysfunction 04/2019   Hyperglycemia 09/2019   Low testosterone in male 04/2019   Proteinuria  01/07/2020   Vitamin D deficiency 04/2019   Past Surgical History:  Past Surgical History:  Procedure Laterality Date   AMPUTATION Left 02/02/2018   Procedure: LEFT GREAT TOE AMPUTATION;  Surgeon: Newt Minion, MD;  Location: [facility];  Service: Orthopedics;  Laterality: Left;   AMPUTATION Left 02/08/2019   Procedure: LEFT FOOT 1ST RAY AMPUTATION;  Surgeon: Newt Minion, MD;  Location: [facility];  Service: Orthopedics;  Laterality: Left;   Social History:  reports that he has never smoked. He has never used smokeless tobacco. He reports current alcohol use of about 2.0 standard drinks of alcohol per week. He reports that he does not use drugs. Family History:  Family History  Problem Relation Age of Onset   Other Mother        Healthy   Other Father        Healthy     HOME MEDICATIONS: Allergies as of 08/12/2020   No Known Allergies      Medication List        Accurate as of August 12, 2020  7:11 AM. If you have any questions, ask your nurse or doctor.          atorvastatin 10 MG tablet Commonly known as: LIPITOR Take 1 tablet (10 mg total) by mouth daily.   atorvastatin 10 MG tablet Commonly known as: LIPITOR TAKE 1 TABLET (10 MG TOTAL) BY MOUTH DAILY.  blood glucose meter kit and supplies Dispense based on patient and insurance preference. Use up to four times daily as directed. (FOR ICD-10 E10.9, E11.9).   clomiPHENE 50 MG tablet Commonly known as: CLOMID Take 1 tablet by mouth once daily   gabapentin 300 MG capsule Commonly known as: NEURONTIN Take 1 capsule (300 mg total) by mouth at bedtime.   gabapentin 300 MG capsule Commonly known as: NEURONTIN TAKE 1 CAPSULE (300 MG TOTAL) BY MOUTH AT BEDTIME.   ibuprofen 800 MG tablet Commonly known as: ADVIL Take 1 tablet (800 mg total) by mouth every 8 (eight) hours as needed.   Insulin Lispro Prot & Lispro (75-25) 100 UNIT/ML Kwikpen Commonly known as: HumaLOG Mix 75/25 KwikPen INJECT 35 UNITS INTO THE SKIN 2  (TWO) TIMES DAILY.   HumaLOG Mix 75/25 KwikPen (75-25) 100 UNIT/ML Kwikpen Generic drug: Insulin Lispro Prot & Lispro INJECT 35 UNITS INTO THE SKIN 2 (TWO) TIMES DAILY.   Lantus SoloStar 100 UNIT/ML Solostar Pen Generic drug: insulin glargine INJECT 50 UNITS INTO THE SKIN DAILY.   Lantus SoloStar 100 UNIT/ML Solostar Pen Generic drug: insulin glargine Inject 50 Units into the skin at bedtime.   Lantus SoloStar 100 UNIT/ML Solostar Pen Generic drug: insulin glargine INJECT 50 UNITS INTO THE SKIN AT BEDTIME.   onetouch ultrasoft lancets Use 4 times daily as directed  E11.42, Z79.4   OneTouch Verio test strip Generic drug: glucose blood Use as instructed   OneTouch Verio w/Device Kit Inject 1 Units into the skin QID. Use meter 4 times daily as directed   sildenafil 100 MG tablet Commonly known as: VIAGRA Take 1 tablet (100 mg total) by mouth daily as needed for erectile dysfunction.   sildenafil 100 MG tablet Commonly known as: VIAGRA TAKE 1 TABLET (100 MG TOTAL) BY MOUTH DAILY AS NEEDED FOR ERECTILE DYSFUNCTION.   Victoza 18 MG/3ML Sopn Generic drug: liraglutide Start 0.75m SQ once a day for 7 days, then increase to 1.265monce a day   Victoza 18 MG/3ML Sopn Generic drug: liraglutide TAKE 1.2MG ONCE A DAY   Vitamin D (Ergocalciferol) 1.25 MG (50000 UNIT) Caps capsule Commonly known as: DRISDOL Take 1 capsule (50,000 Units total) by mouth every 7 (seven) days.   Vitamin D (Ergocalciferol) 1.25 MG (50000 UNIT) Caps capsule Commonly known as: DRISDOL TAKE 1 CAPSULE (50,000 UNITS TOTAL) BY MOUTH EVERY 7 (SEVEN) DAYS.          OBJECTIVE:   PHYSICAL EXAM: VS: There were no vitals taken for this visit.   EXAM: General: Pt appears well and is in NAD  Neck: General: Supple without adenopathy. Thyroid: Thyroid size normal.  No goiter or nodules appreciated. No thyroid bruit.  Lungs: Clear with good BS bilat with no rales, rhonchi, or wheezes  Heart:  Auscultation: RRR.  Abdomen: Normoactive bowel sounds, soft, nontender, without masses or organomegaly palpable  Extremities:  BL LE: No pretibial edema normal ROM and strength.  Mental Status: Judgment, insight: Intact Orientation: Oriented to time, place, and person Mood and affect: No depression, anxiety, or agitation     DATA REVIEWED: Results for TIKAYSAN, PEIXOTOMRN [id number] of 02/05/2020 13:33  Ref. Range 02/05/2020 07:56  Sodium Latest Ref Range: 135 - 145 mEq/L 142  Potassium Latest Ref Range: 3.5 - 5.1 mEq/L 4.4  Chloride Latest Ref Range: 96 - 112 mEq/L 107  CO2 Latest Ref Range: 19 - 32 mEq/L 29  Glucose Latest Ref Range: 70 - 99 mg/dL 186 (H)  BUN Latest  Ref Range: 6 - 23 mg/dL 12  Creatinine Latest Ref Range: 0.40 - 1.50 mg/dL 0.99  Calcium Latest Ref Range: 8.4 - 10.5 mg/dL 9.8  Alkaline Phosphatase Latest Ref Range: 39 - 117 U/L 66  Albumin Latest Ref Range: 3.5 - 5.2 g/dL 4.1  AST Latest Ref Range: 0 - 37 U/L 22  ALT Latest Ref Range: 0 - 53 U/L 28  Total Protein Latest Ref Range: 6.0 - 8.3 g/dL 7.5  Total Bilirubin Latest Ref Range: 0.2 - 1.2 mg/dL 0.2  GFR Latest Ref Range: >60.00 mL/min 96.95  WBC Latest Ref Range: 4.0 - 10.5 K/uL 5.2  RBC Latest Ref Range: 4.22 - 5.81 Mil/uL 5.76  Hemoglobin Latest Ref Range: 13.0 - 17.0 g/dL 14.2  HCT Latest Ref Range: 39 - 52 % 44.7  MCV Latest Ref Range: 78.0 - 100.0 fl 77.6 (L)  MCHC Latest Ref Range: 30.0 - 36.0 g/dL 31.7  RDW Latest Ref Range: 11.5 - 15.5 % 14.0  Platelets Latest Ref Range: 150 - 400 K/uL 135.0 (L)  Testosterone Latest Ref Range: 300.00 - 890.00 ng/dL 291.65 (L)   MRI Brain 08/22/2019   Brain: Dynamic pituitary protocol. The sella is shallow. The pituitary is normal in height 5.4 mm. The pituitary enhances homogeneously. No microadenoma or macro adenoma. Infundibulum midline. Normal cavernous sinus and optic chiasm.   Ventricle size normal. Cerebral volume normal. Negative for acute infarct  hemorrhage or mass. Few small white matter hyperintensities bilaterally appear chronic.   Vascular: Normal arterial flow voids   Skull and upper cervical spine: Normal bone marrow.   Sinuses/Orbits: Mild mucosal edema paranasal sinuses.  Normal orbit   Other: None   IMPRESSION: Negative for pituitary lesion. The sella is shallow however the pituitary is normal in size and enhances homogeneously.   No acute intracranial abnormality. Few small white matter hyperintensities bilaterally may reflect chronic microvascular ischemia. Correlate with risk factors for small vessel disease.  ASSESSMENT / PLAN / RECOMMENDATIONS:   Hypogonadotrophic Hypogonadism :  - Unknown cause, as he has no hx of head injury, or chronic narcotic or marijuana use.  - Pituitary MRI was unrevealing (07/2019) - Of note the pt was on testosterone therapy at some point in the past in the form of injectables. Unclear if this is the cause of his secondary hypogonadism  - Due to his intention to have children in the future, we have opted to treat with clomiphene. He understands this is an off label use.    Medications   Continue Clomiphene 50 mg daily     F/U in 6 months    Signed electronically by: Mack Guise, MD  [facility]  [facility] [address] [place], [place] [address] Phone: [phone] FAX: [phone]      CC: Azzie Glatter, [place] (Inactive) [address] [place] [address] Phone: [phone]  Fax: [phone]   Return to Endocrinology clinic as below: Future Appointments  Date Time Provider [place]  08/12/2020  7:30 AM Jenella Craigie, Melanie Crazier, MD [facility] None  11/27/2020  1:20 PM Vevelyn Francois, NP [facility] None

## 2020-08-28 ENCOUNTER — Other Ambulatory Visit: Payer: Self-pay

## 2020-08-28 MED FILL — Insulin Lispro Prot & Lispro Sus Pen-inj 100 Unit/ML (75-25): SUBCUTANEOUS | 30 days supply | Qty: 21 | Fill #2 | Status: AC

## 2020-08-28 MED FILL — Ergocalciferol Cap 1.25 MG (50000 Unit): ORAL | 28 days supply | Qty: 4 | Fill #1 | Status: AC

## 2020-08-28 MED FILL — Insulin Glargine Soln Pen-Injector 100 Unit/ML: SUBCUTANEOUS | 30 days supply | Qty: 15 | Fill #2 | Status: AC

## 2020-08-28 MED FILL — Liraglutide Soln Pen-injector 18 MG/3ML (6 MG/ML): SUBCUTANEOUS | 30 days supply | Qty: 6 | Fill #2 | Status: AC

## 2020-08-28 MED FILL — Sildenafil Citrate Tab 100 MG: ORAL | 23 days supply | Qty: 6 | Fill #0 | Status: AC

## 2020-08-28 MED FILL — Atorvastatin Calcium Tab 10 MG (Base Equivalent): ORAL | 30 days supply | Qty: 30 | Fill #2 | Status: AC

## 2020-09-02 ENCOUNTER — Other Ambulatory Visit: Payer: Self-pay | Admitting: Internal Medicine

## 2020-09-23 ENCOUNTER — Telehealth: Payer: Self-pay | Admitting: Orthopedic Surgery

## 2020-09-23 NOTE — Telephone Encounter (Signed)
Called patient left message to return call to schedule an appointment with Dr Lajoyce Corners for his foot.

## 2020-10-02 ENCOUNTER — Other Ambulatory Visit: Payer: Self-pay

## 2020-10-02 MED FILL — Gabapentin Cap 300 MG: ORAL | 30 days supply | Qty: 30 | Fill #0 | Status: AC

## 2020-10-02 MED FILL — Insulin Lispro Prot & Lispro Sus Pen-inj 100 Unit/ML (75-25): SUBCUTANEOUS | 30 days supply | Qty: 21 | Fill #3 | Status: AC

## 2020-10-02 MED FILL — Insulin Glargine Soln Pen-Injector 100 Unit/ML: SUBCUTANEOUS | 30 days supply | Qty: 15 | Fill #3 | Status: AC

## 2020-10-02 MED FILL — Liraglutide Soln Pen-injector 18 MG/3ML (6 MG/ML): SUBCUTANEOUS | 30 days supply | Qty: 6 | Fill #3 | Status: AC

## 2020-10-05 ENCOUNTER — Other Ambulatory Visit: Payer: Self-pay

## 2020-10-15 ENCOUNTER — Other Ambulatory Visit: Payer: Self-pay | Admitting: Internal Medicine

## 2020-10-21 ENCOUNTER — Ambulatory Visit: Payer: Self-pay

## 2020-10-21 ENCOUNTER — Ambulatory Visit (INDEPENDENT_AMBULATORY_CARE_PROVIDER_SITE_OTHER): Payer: 59 | Admitting: Internal Medicine

## 2020-10-21 ENCOUNTER — Ambulatory Visit (INDEPENDENT_AMBULATORY_CARE_PROVIDER_SITE_OTHER): Payer: 59 | Admitting: Family

## 2020-10-21 ENCOUNTER — Encounter: Payer: Self-pay | Admitting: Family

## 2020-10-21 ENCOUNTER — Other Ambulatory Visit: Payer: Self-pay

## 2020-10-21 DIAGNOSIS — M25572 Pain in left ankle and joints of left foot: Secondary | ICD-10-CM | POA: Diagnosis not present

## 2020-10-21 DIAGNOSIS — E230 Hypopituitarism: Secondary | ICD-10-CM

## 2020-10-21 DIAGNOSIS — R7309 Other abnormal glucose: Secondary | ICD-10-CM

## 2020-10-21 DIAGNOSIS — R739 Hyperglycemia, unspecified: Secondary | ICD-10-CM | POA: Diagnosis not present

## 2020-10-21 DIAGNOSIS — Z794 Long term (current) use of insulin: Secondary | ICD-10-CM

## 2020-10-21 DIAGNOSIS — M14679 Charcot's joint, unspecified ankle and foot: Secondary | ICD-10-CM

## 2020-10-21 DIAGNOSIS — E118 Type 2 diabetes mellitus with unspecified complications: Secondary | ICD-10-CM | POA: Diagnosis not present

## 2020-10-21 LAB — COMPREHENSIVE METABOLIC PANEL
ALT: 30 U/L (ref 0–53)
AST: 27 U/L (ref 0–37)
Albumin: 4 g/dL (ref 3.5–5.2)
Alkaline Phosphatase: 59 U/L (ref 39–117)
BUN: 14 mg/dL (ref 6–23)
CO2: 27 mEq/L (ref 19–32)
Calcium: 9.9 mg/dL (ref 8.4–10.5)
Chloride: 106 mEq/L (ref 96–112)
Creatinine, Ser: 1.04 mg/dL (ref 0.40–1.50)
GFR: 90.93 mL/min (ref >60.00)
Glucose, Bld: 83 mg/dL (ref 70–99)
Potassium: 4.3 mEq/L (ref 3.5–5.1)
Sodium: 141 mEq/L (ref 135–145)
Total Bilirubin: 0.3 mg/dL (ref 0.2–1.2)
Total Protein: 7.4 g/dL (ref 6.0–8.3)

## 2020-10-21 LAB — CBC
HCT: 42.9 % (ref 39.0–52.0)
Hemoglobin: 13.7 g/dL (ref 13.0–17.0)
MCHC: 31.9 g/dL (ref 30.0–36.0)
MCV: 77.6 fl — ABNORMAL LOW (ref 78.0–100.0)
Platelets: 141 10*3/uL — ABNORMAL LOW (ref 150.0–400.0)
RBC: 5.52 Mil/uL (ref 4.22–5.81)
RDW: 14.6 % (ref 11.5–15.5)
WBC: 5.2 10*3/uL (ref 4.0–10.5)

## 2020-10-21 LAB — TESTOSTERONE: Testosterone: 270.13 ng/dL — ABNORMAL LOW (ref 300.00–890.00)

## 2020-10-21 MED ORDER — DAPAGLIFLOZIN PROPANEDIOL 5 MG PO TABS
5.0000 mg | ORAL_TABLET | Freq: Every day | ORAL | 1 refills | Status: DC
Start: 2020-10-21 — End: 2021-04-26
  Filled 2020-10-21 – 2020-11-09 (×2): qty 30, 30d supply, fill #0

## 2020-10-21 NOTE — Patient Instructions (Signed)
-   Continue Lantus 50 units daily  - Decrease HUmalog Mix to 25 units with Breakfast  - Continue Victoza 1.8 mg daily  - Start Farxiga 5 mg, 1 tablet every morning      HOW TO TREAT LOW BLOOD SUGARS (Blood sugar LESS THAN 70 MG/DL) Please follow the RULE OF 15 for the treatment of hypoglycemia treatment (when your (blood sugars are less than 70 mg/dL)   STEP 1: Take 15 grams of carbohydrates when your blood sugar is low, which includes:  3-4 GLUCOSE TABS  OR 3-4 OZ OF JUICE OR REGULAR SODA OR ONE TUBE OF GLUCOSE GEL    STEP 2: RECHECK blood sugar in 15 MINUTES STEP 3: If your blood sugar is still low at the 15 minute recheck --> then, go back to STEP 1 and treat AGAIN with another 15 grams of carbohydrates.

## 2020-10-21 NOTE — Patient Instructions (Signed)
Neuropathic Arthropathy Neuropathic arthropathy is a condition that results from poor circulation and numbness in the foot and ankle. Poor blood supply and numbness can cause foot or ankle injuries that are too small to be noticed or treated (microtrauma). You may continue to walk on your damaged foot and make the condition worse. Poor circulation can also cause poor healing and bone weakness. Over time, this condition can lead to: Broken bones. Bone infection. Joint damage or dislocation. Deformities, such as a collapsed foot arch. Disability of the foot or ankle. Neuropathic arthropathy is also called Charcot arthropathy or Charcot foot. What are the causes? This condition may be caused by any disease that damages nerves and blood vessels of the foot and ankle. Diabetes is the most common cause. In this case, neuropathic arthropathy may also be called diabetic foot.  Less common causes of this condition include: Polio. Leprosy. Syphilis. Spinal cord damage. Long-term (chronic) alcoholism. What are the signs or symptoms? Early symptoms of this condition include: Foot swelling without a known injury and with very little pain. Warmth and redness. Later symptoms may include: Foot deformity. Ankle instability. Foot sores (ulcers). How is this diagnosed? This condition may be diagnosed based on: Your symptoms and medical history. A physical exam. Tests, such as: Blood tests to check for signs of infection or poorly controlled diabetes. X-rays of the ankle and foot to look for bone damage. Imaging tests to check for soft tissue or joint damage, such as an MRI or ultrasound. A bone scan to check for a bone infection. How is this treated? Treatment depends on the severity of the damage. If the damage is minor, treatment may include: Wearing a brace, boot, or cast to protect and support the foot and ankle. Using an assistive device such as a walker, a knee walker, a wheelchair, or  crutches to keep weight off the foot so it can heal. If you have a bone or soft tissue infection, you may receive antibiotics. More severe cases may require surgery to repair fractures, remove diseased bone or tissues, or reconstruct the foot or ankle. After treatment, you may need to wear a special cushioned shoe or boot for protection. Follow these instructions at home: If you have a brace, boot, or shoe: Wear it as told by your health care provider. Remove it only as told by your health care provider. Loosen it if your toes tingle, become numb, or turn cold and blue. Keep it clean and dry. If you have a cast: Do not put pressure on any part of the cast until it is fully hardened. This may take several hours. Do not stick anything inside the cast to scratch your skin. Doing that increases your risk of infection. Check the skin around the cast every day. Tell your health care provider about any concerns. You may put lotion on dry skin around the edges of the cast. Do not put lotion on the skin underneath the cast. Keep it clean and dry. Bathing If you have a cast, brace, boot, or shoe that is not waterproof: Do not let it get wet. Cover it with a watertight covering when you take a bath or shower. Activity  Return to your normal activities as told by your health care provider. Ask your health care provider what activities are safe for you. Avoid sitting for a long time without moving. Get up to take short walks every 1-2 hours. This is important to improve blood flow and breathing. Ask for help if you   feel weak or unsteady. Do not use the injured limb to support your body weight until your health care provider says that you can. Use a walker, a knee walker, a wheelchair, or crutches as told by your health care provider. General instructions   Check your feet every day for any redness, warmth, swelling, or ulceration. Do not walk barefooted. This could lead to injuries to your foot. Wear  well-fitted footwear. Take over-the-counter and prescription medicines only as told by your health care provider. If you were prescribed an antibiotic medicine, take it as told by your health care provider. Do not stop using the antibiotic even if you start to feel better. Do not use any products that contain nicotine or tobacco, such as cigarettes, e-cigarettes, and chewing tobacco. If you need help quitting, ask your health care provider. Keep all follow-up visits as told by your health care provider. This is important. Contact a health care provider if you have: A fever or chills. Any new or worsening symptoms, such as redness, discharge, swelling, warmth, or ulcers on your foot. Any problems with your cast, brace, or boot. Summary Neuropathic arthropathy is a condition that results from poor circulation and numbness in the foot and ankle. Poor blood supply and numbness can cause very small injuries (microtrauma) to your foot or ankle. Continuing to walk on your damaged foot can make the condition worse. Any disease that damages nerves and blood vessels of the foot and ankle can cause neuropathic arthropathy. Diabetes is the most common cause. Foot swelling without a known injury is the most common early symptom. If the damage is minor, treatment may include wearing a brace, boot, or cast and using a walker, a knee walker, a wheelchair, or crutches. This information is not intended to replace advice given to you by your health care provider. Make sure you discuss any questions you have with your health care provider. Document Revised: 09/05/2018 Document Reviewed: 07/31/2018 Elsevier Patient Education  2022 Elsevier Inc.  

## 2020-10-21 NOTE — Progress Notes (Signed)
Office Visit Note   Patient: Jonathan [name]           Date of Birth: 12/23/1980           MRN: [id number] Visit Date: 10/21/2020              Requested by: No referring provider defined for this encounter. PCP: Kallie Locks, FNP (Inactive)  No chief complaint on file.     HPI: The patient is a 40 year old gentleman who is seen today for evaluation of swelling and a change in the shape of his foot.  He is had remote first ray amputation.  He reports at the time of his surgery he did have issues with swelling and wore compression garments for about a year.  Despite compression is having some swelling over the midfoot especially medially he is quite concerned about this however does not have any associated pain no difficulty with weightbearing.  Denies any erythema or warmth.  Upon further reflection recalls having a period of redness and warmth to the left foot over a year ago  Assessment & Plan: Visit Diagnoses:  1. Pain in left ankle and joints of left foot     Plan: Charcot arthropathy left foot. given an order for custom orthotics.  Patient recommended to proceed with custom orthotics wear these daily to prevent skin breakdown.  He will check with his insurance.  He will follow-up on an as-needed basis.  Follow-Up Instructions: No follow-ups on file.   Ortho Exam  Patient is alert, oriented, no adenopathy, well-dressed, normal affect, normal respiratory effort. On examination of the left foot the first ray is surgically absent.  He does appear to have a rocker-bottom deformity and prominence of the midfoot.  There is no erythema no warmth no callus.  No impending skin breakdown.  Does have a palpable dorsalis pedis pulse.  Imaging: No results found. No images are attached to the encounter.  Labs: Lab Results  Component Value Date   HGBA1C 9.8 (A) 01/07/2020   HGBA1C 9.8 01/07/2020   HGBA1C 9.8 (A) 01/07/2020   HGBA1C 9.8 (A) 01/07/2020   ESRSEDRATE 5 01/30/2018    ESRSEDRATE 23 (H) 12/26/2016   ESRSEDRATE 1 12/05/2016   CRP <0.8 01/30/2018   CRP 5.8 (H) 12/26/2016   CRP <0.8 12/05/2016   REPTSTATUS 02/04/2018 FINAL 01/30/2018   GRAMSTAIN  12/26/2016    FEW WBC PRESENT, PREDOMINANTLY PMN ABUNDANT GRAM POSITIVE COCCI IN PAIRS ABUNDANT GRAM NEGATIVE RODS Performed at [facility], [address] [place], [place] [address]    CULT  01/30/2018    NO GROWTH 5 DAYS Performed at [facility], [address] [place], [place] [address]      Lab Results  Component Value Date   ALBUMIN 4.2 05/26/2020   ALBUMIN 4.1 02/05/2020   ALBUMIN 4.0 11/29/2019    No results found for: MG Lab Results  Component Value Date   VD25OH 18.9 (L) 05/26/2020   VD25OH 8.8 (L) 04/29/2019    No results found for: PREALBUMIN CBC EXTENDED Latest Ref Rng & Units 05/26/2020 02/05/2020 11/29/2019  WBC 3.4 - 10.8 x10E3/uL 5.4 5.2 5.0  RBC 4.14 - 5.80 x10E6/uL 6.07(H) 5.76 5.07  HGB 13.0 - 17.7 g/dL 54.2 70.6 12.7(L)  HCT 37.5 - 51.0 % 47.8 44.7 39.4  PLT 150 - 450 x10E3/uL 166 135.0(L) 147.0(L)  NEUTROABS 1.4 - 7.0 x10E3/uL 2.7 - 2.3  LYMPHSABS 0.7 - 3.1 x10E3/uL 2.3 - 2.2  There is no height or weight on file to calculate BMI.  Orders:  Orders Placed This Encounter  Procedures   XR Foot Complete Left   No orders of the defined types were placed in this encounter.    Procedures: No procedures performed  Clinical Data: No additional findings.  ROS:  All other systems negative, except as noted in the HPI. Review of Systems  Constitutional:  Negative for chills and fever.  Cardiovascular:  Negative for leg swelling.  Musculoskeletal:  Positive for joint swelling. Negative for arthralgias, gait problem and myalgias.  Skin:  Negative for color change.   Objective: Vital Signs: There were no vitals taken for this visit.  Specialty Comments:  No specialty comments available.  PMFS History: Patient Active Problem List   Diagnosis  Date Noted   Hypogonadotropic hypogonadism (HCC) 07/17/2019   Hemoglobin A1C between 7% and 9% indicating borderline diabetic control 04/29/2019   Decreased libido 04/29/2019   Low testosterone in male 04/2019   Non-pressure chronic ulcer of other part of left foot with necrosis of bone (HCC)    Cutaneous abscess of left foot    Hemoglobin A1C greater than 9%, indicating poor diabetic control 08/27/2018   Amputation of left great toe (HCC) 08/27/2018   Osteomyelitis of great toe of left foot (HCC) 01/30/2018   Osteomyelitis (HCC) 01/30/2018   Depression with anxiety 01/30/2018   Major depressive disorder, recurrent severe without psychotic features (HCC) 07/16/2017   OD (overdose of drug), intentional self-harm, initial encounter (HCC) 07/15/2017   Cellulitis 12/26/2016   Open toe wound, subsequent encounter 12/12/2016   Hyperglycemia 12/06/2016   Diabetic ulcer of left foot (HCC) 12/05/2016   Diabetic neuropathy (HCC) 10/16/2015   Erectile dysfunction 08/05/2015   Low testosterone 09/12/2014   DM type 2, uncontrolled, with neuropathy (HCC) 06/12/2014   Diabetes mellitus, type II (HCC) 03/23/2012   Noncompliance 03/23/2012   Gastroenteritis and colitis, viral 03/23/2012   Past Medical History:  Diagnosis Date   Amputation of left great toe (HCC) 01/2019   Anxiety    Decreased libido 04/2019   Diabetes mellitus    Erectile dysfunction 04/2019   Hyperglycemia 09/2019   Low testosterone in male 04/2019   Proteinuria 01/07/2020   Vitamin D deficiency 04/2019    Family History  Problem Relation Age of Onset   Other Mother        Healthy   Other Father        Healthy    Past Surgical History:  Procedure Laterality Date   AMPUTATION Left 02/02/2018   Procedure: LEFT GREAT TOE AMPUTATION;  Surgeon: Nadara Mustard, MD;  Location: [facility] OR;  Service: Orthopedics;  Laterality: Left;   AMPUTATION Left 02/08/2019   Procedure: LEFT FOOT 1ST RAY AMPUTATION;  Surgeon: Nadara Mustard,  MD;  Location: [facility] OR;  Service: Orthopedics;  Laterality: Left;   Social History   Occupational History   Not on file  Tobacco Use   Smoking status: Never   Smokeless tobacco: Never  Vaping Use   Vaping Use: Never used  Substance and Sexual Activity   Alcohol use: Yes    Alcohol/week: 2.0 standard drinks    Types: 1 Glasses of wine, 1 Cans of beer per week    Comment: occassional   Drug use: No   Sexual activity: Yes

## 2020-10-21 NOTE — Progress Notes (Signed)
Name: Jonathan [name]  MRN/ DOB: [id number], 07-11-1980    Age/ Sex: 40 y.o., male     PCP: Azzie Glatter, FNP (Inactive)   Reason for Endocrinology Evaluation: Hypogonadism      Initial Endocrinology Clinic Visit: 07/15/2019      PATIENT IDENTIFIER: Jonathan [name] is a 40 y.o., male with a past medical history of T2DM, and Hypogonadism. He has followed with [facility] since 07/15/2019 for consultative assistance with management of his Hypogonadism .      HISTORICAL SUMMARY: The patient was first diagnosed with   hypogonadism in 2016. He has had multiple testosterone readings with a nadir of 125 ng/dL, as well as low sex hormone binding globulin. He was on Testosterone Cypionate at some point.   No hx of long term narcotic or illicit drug use.  No hx of head trauma  No of testicular childhood infection that he could recall.  No FH of prostate cancer    Testicular size on initial presentation 20 mL bilaterally, with a total testosterone of 167 ng/dL, inappropriately normal FSH and LH 3.4 and 7.54 mIU/mL respectively. Normal prolactin, TFT and cortisol.   MRI of brain did NOT show any pituitary pathology (08/22/2019)   He was prescribed Clomiphene 50 mg daily which was started 08/2019. Repeat testosterone increased from 155 ng/dL to 260 ng/dL.   MRI showed normal pituitary gland      DIABETES HISTORY:  Jonathan [name]  was diagnosed with DM in 2014, He is on basal insulin and Victoza with continued hyperglycemia. His hemoglobin A1c has ranged from 9.8% in 2021, peaking at >15.5% in 2019.  Metofrmin causes diarrhea   SUBJECTIVE:   Today (10/21/2020):  Jonathan [name] is here for a follow up on hypogonadotropic hypogonadism  . He has been on Clomiphene for a few months without side effects    Has normal libido  He continue with dry ejaculation  Energy level has been improving , he has erections but not lasting long enough   He checks his glucose at home  through dexcom , he gets his dexcom through work  program     HOME ENDOCRINE MEDICATION  Clomiphene 50 mg daily  Lantus 50 units daily  Victoza 1.2 mg daily  Humalog MIX 35 units QAM     HISTORY:  Past Medical History:  Past Medical History:  Diagnosis Date   Amputation of left great toe ([place]) 01/2019   Anxiety    Decreased libido 04/2019   Diabetes mellitus    Erectile dysfunction 04/2019   Hyperglycemia 09/2019   Low testosterone in male 04/2019   Proteinuria 01/07/2020   Vitamin D deficiency 04/2019   Past Surgical History:  Past Surgical History:  Procedure Laterality Date   AMPUTATION Left 02/02/2018   Procedure: LEFT GREAT TOE AMPUTATION;  Surgeon: Newt Minion, MD;  Location: [facility];  Service: Orthopedics;  Laterality: Left;   AMPUTATION Left 02/08/2019   Procedure: LEFT FOOT 1ST RAY AMPUTATION;  Surgeon: Newt Minion, MD;  Location: [facility];  Service: Orthopedics;  Laterality: Left;   Social History:  reports that he has never smoked. He has never used smokeless tobacco. He reports current alcohol use of about 2.0 standard drinks per week. He reports that he does not use drugs. Family History:  Family History  Problem Relation Age of Onset   Other Mother        Healthy   Other Father        Healthy  HOME MEDICATIONS: Allergies as of 10/21/2020   No Known Allergies      Medication List        Accurate as of October 21, 2020  8:24 AM. If you have any questions, ask your nurse or doctor.          atorvastatin 10 MG tablet Commonly known as: LIPITOR Take 1 tablet (10 mg total) by mouth daily.   atorvastatin 10 MG tablet Commonly known as: LIPITOR TAKE 1 TABLET (10 MG TOTAL) BY MOUTH DAILY.   blood glucose meter kit and supplies Dispense based on patient and insurance preference. Use up to four times daily as directed. (FOR ICD-10 E10.9, E11.9).   clomiPHENE 50 MG tablet Commonly known as: CLOMID Take 1 tablet by mouth once daily    gabapentin 300 MG capsule Commonly known as: NEURONTIN Take 1 capsule (300 mg total) by mouth at bedtime.   gabapentin 300 MG capsule Commonly known as: NEURONTIN TAKE 1 CAPSULE (300 MG TOTAL) BY MOUTH AT BEDTIME.   ibuprofen 800 MG tablet Commonly known as: ADVIL Take 1 tablet (800 mg total) by mouth every 8 (eight) hours as needed.   Insulin Lispro Prot & Lispro (75-25) 100 UNIT/ML Kwikpen Commonly known as: HumaLOG Mix 75/25 KwikPen INJECT 35 UNITS INTO THE SKIN 2 (TWO) TIMES DAILY.   HumaLOG Mix 75/25 KwikPen (75-25) 100 UNIT/ML Kwikpen Generic drug: Insulin Lispro Prot & Lispro INJECT 35 UNITS INTO THE SKIN 2 (TWO) TIMES DAILY.   Lantus SoloStar 100 UNIT/ML Solostar Pen Generic drug: insulin glargine INJECT 50 UNITS INTO THE SKIN DAILY.   Lantus SoloStar 100 UNIT/ML Solostar Pen Generic drug: insulin glargine Inject 50 Units into the skin at bedtime.   Lantus SoloStar 100 UNIT/ML Solostar Pen Generic drug: insulin glargine INJECT 50 UNITS INTO THE SKIN AT BEDTIME.   onetouch ultrasoft lancets Use 4 times daily as directed  E11.42, Z79.4   OneTouch Verio test strip Generic drug: glucose blood Use as instructed   OneTouch Verio w/Device Kit Inject 1 Units into the skin QID. Use meter 4 times daily as directed   sildenafil 100 MG tablet Commonly known as: VIAGRA Take 1 tablet (100 mg total) by mouth daily as needed for erectile dysfunction.   sildenafil 100 MG tablet Commonly known as: VIAGRA TAKE 1 TABLET (100 MG TOTAL) BY MOUTH DAILY AS NEEDED FOR ERECTILE DYSFUNCTION.   Victoza 18 MG/3ML Sopn Generic drug: liraglutide Start 0.49m SQ once a day for 7 days, then increase to 1.288monce a day   Victoza 18 MG/3ML Sopn Generic drug: liraglutide TAKE 1.2MG ONCE A DAY   Vitamin D (Ergocalciferol) 1.25 MG (50000 UNIT) Caps capsule Commonly known as: DRISDOL Take 1 capsule (50,000 Units total) by mouth every 7 (seven) days.   Vitamin D (Ergocalciferol) 1.25  MG (50000 UNIT) Caps capsule Commonly known as: DRISDOL TAKE 1 CAPSULE (50,000 UNITS TOTAL) BY MOUTH EVERY 7 (SEVEN) DAYS.          OBJECTIVE:   PHYSICAL EXAM: VS: There were no vitals taken for this visit.    EXAM: General: Pt appears well and is in NAD  Neck: General: Supple without adenopathy. Thyroid: Thyroid size normal.  No goiter or nodules appreciated. No thyroid bruit.  Lungs: Clear with good BS bilat with no rales, rhonchi, or wheezes  Heart: Auscultation: RRR.  Abdomen: Normoactive bowel sounds, soft, nontender, without masses or organomegaly palpable  Extremities:  BL LE: No pretibial edema normal ROM and strength.  Mental Status: Judgment,  insight: Intact Orientation: Oriented to time, place, and person Mood and affect: No depression, anxiety, or agitation     DATA REVIEWED: Results for GRAEME, MENEES (MRN [id number]) as of 10/23/2020 09:01  Ref. Range 10/21/2020 10:39  Sodium Latest Ref Range: 135 - 145 mEq/L 141  Potassium Latest Ref Range: 3.5 - 5.1 mEq/L 4.3  Chloride Latest Ref Range: 96 - 112 mEq/L 106  CO2 Latest Ref Range: 19 - 32 mEq/L 27  Glucose Latest Ref Range: 70 - 99 mg/dL 83  BUN Latest Ref Range: 6 - 23 mg/dL 14  Creatinine Latest Ref Range: 0.40 - 1.50 mg/dL 1.04  Calcium Latest Ref Range: 8.4 - 10.5 mg/dL 9.9  Alkaline Phosphatase Latest Ref Range: 39 - 117 U/L 59  Albumin Latest Ref Range: 3.5 - 5.2 g/dL 4.0  AST Latest Ref Range: 0 - 37 U/L 27  ALT Latest Ref Range: 0 - 53 U/L 30  Total Protein Latest Ref Range: 6.0 - 8.3 g/dL 7.4  Total Bilirubin Latest Ref Range: 0.2 - 1.2 mg/dL 0.3  GFR Latest Ref Range: >60.00 mL/min 90.93  WBC Latest Ref Range: 4.0 - 10.5 K/uL 5.2  RBC Latest Ref Range: 4.22 - 5.81 Mil/uL 5.52  Hemoglobin Latest Ref Range: 13.0 - 17.0 g/dL 13.7  HCT Latest Ref Range: 39.0 - 52.0 % 42.9  MCV Latest Ref Range: 78.0 - 100.0 fl 77.6 (L)  MCHC Latest Ref Range: 30.0 - 36.0 g/dL 31.9  RDW Latest Ref Range: 11.5 -  15.5 % 14.6  Platelets Latest Ref Range: 150.0 - 400.0 K/uL 141.0 (L)  Testosterone Latest Ref Range: 300.00 - 890.00 ng/dL 270.13 (L)    MRI Brain 08/22/2019   Brain: Dynamic pituitary protocol. The sella is shallow. The pituitary is normal in height 5.4 mm. The pituitary enhances homogeneously. No microadenoma or macro adenoma. Infundibulum midline. Normal cavernous sinus and optic chiasm.   Ventricle size normal. Cerebral volume normal. Negative for acute infarct hemorrhage or mass. Few small white matter hyperintensities bilaterally appear chronic.   Vascular: Normal arterial flow voids   Skull and upper cervical spine: Normal bone marrow.   Sinuses/Orbits: Mild mucosal edema paranasal sinuses.  Normal orbit   Other: None   IMPRESSION: Negative for pituitary lesion. The sella is shallow however the pituitary is normal in size and enhances homogeneously.   No acute intracranial abnormality. Few small white matter hyperintensities bilaterally may reflect chronic microvascular ischemia. Correlate with risk factors for small vessel disease.  ASSESSMENT / PLAN / RECOMMENDATIONS:   Hypogonadotrophic Hypogonadism :  - Unknown cause, as he has no hx of head injury, or chronic narcotic or marijuana use.  - Pituitary MRI was unrevealing (07/2019) - Of note the pt was on testosterone therapy at some point in the past in the form of injectables. Unclear if this is the cause of his secondary hypogonadism  - Due to his intention to have children in the future, we have opted to treat with clomiphene. He understands this is an off label use.  -His testosterone continues to be below goal, but I am not going to change this yet.  The next option would be testosterone replacement therapy but he would like to avoid this for now. -Another option will be hCG therapy but will hold off on this until he is actively trying to have children.   Medications   Continue Clomiphene 50 mg daily     2. T2DM, with improving glycemic control:   -His A1c 7.9% -I am  going to reduce his Humalog mix from 35 to 25 units in the morning.  I would prefer to switch him to plain Humalog if necessary in the future due to the increased risk of hypoglycemia with combination regimen of Humalog mix and basal insulin.  He has been taking Humalog mix every morning regardless whether he eats a meal or not, I have educated him about the importance of taking Humalog mix with first meal of the day at this time. -We discussed SGLT2 inhibitors, cautioned against genital infections  Medication - Continue Lantus 50 units daily  - Decrease HUmalog Mix to 25 units with Breakfast  -Increase Victoza 1.8 mg daily  - Start Farxiga 5 mg, 1 tablet every morning       F/U in 4 months    Signed electronically by: Mack Guise, MD  [facility]  [address] [facility] [address] [place], [place] [address] Phone: [phone] FAX: [phone]      CC: Azzie Glatter, [place] (Inactive) [address] [place] [address] Phone: [phone]  Fax: [phone]   Return to Endocrinology clinic as below: Future Appointments  Date Time Provider [place]  10/21/2020  8:30 AM Suzan Slick, NP [facility] None  10/21/2020 10:50 AM Jaiyon Wander, Melanie Crazier, MD [facility] None  11/27/2020  1:20 PM Vevelyn Francois, NP [place] None

## 2020-10-23 ENCOUNTER — Other Ambulatory Visit: Payer: Self-pay

## 2020-10-23 ENCOUNTER — Encounter: Payer: Self-pay | Admitting: Internal Medicine

## 2020-10-23 LAB — POCT GLYCOSYLATED HEMOGLOBIN (HGB A1C): Hemoglobin A1C: 7.9 % — AB (ref 4.0–5.6)

## 2020-10-23 MED ORDER — LANTUS SOLOSTAR 100 UNIT/ML ~~LOC~~ SOPN
50.0000 [IU] | PEN_INJECTOR | Freq: Every day | SUBCUTANEOUS | 3 refills | Status: DC
  Filled 2020-10-23: qty 45, 90d supply, fill #0
  Filled 2020-11-09: qty 15, 30d supply, fill #0
  Filled 2020-12-04: qty 15, 30d supply, fill #1
  Filled 2021-01-08: qty 15, 30d supply, fill #2
  Filled 2021-02-25: qty 15, 30d supply, fill #3
  Filled 2021-04-09: qty 15, 30d supply, fill #0

## 2020-10-23 MED ORDER — VICTOZA 18 MG/3ML ~~LOC~~ SOPN
1.8000 mg | PEN_INJECTOR | Freq: Every day | SUBCUTANEOUS | 3 refills | Status: DC
  Filled 2020-10-23: qty 27, 90d supply, fill #0
  Filled 2020-11-09: qty 9, 30d supply, fill #0
  Filled 2020-11-09: qty 6, 30d supply, fill #0
  Filled 2020-12-04: qty 6, 30d supply, fill #1
  Filled 2021-01-08: qty 6, 30d supply, fill #2
  Filled 2021-02-25: qty 6, 20d supply, fill #3
  Filled 2021-03-17: qty 6, 20d supply, fill #0
  Filled 2021-04-09: qty 6, 20d supply, fill #1

## 2020-10-23 MED ORDER — CLOMIPHENE CITRATE 50 MG PO TABS
50.0000 mg | ORAL_TABLET | Freq: Every day | ORAL | 11 refills | Status: DC
  Filled 2020-10-23: qty 30, 30d supply, fill #0

## 2020-10-30 ENCOUNTER — Other Ambulatory Visit: Payer: Self-pay

## 2020-11-09 ENCOUNTER — Other Ambulatory Visit: Payer: Self-pay

## 2020-11-09 ENCOUNTER — Telehealth: Payer: Self-pay | Admitting: Pharmacy Technician

## 2020-11-09 MED FILL — Gabapentin Cap 300 MG: ORAL | 30 days supply | Qty: 30 | Fill #1 | Status: AC

## 2020-11-09 MED FILL — Insulin Lispro Prot & Lispro Sus Pen-inj 100 Unit/ML (75-25): SUBCUTANEOUS | 30 days supply | Qty: 21 | Fill #4 | Status: AC

## 2020-11-09 NOTE — Telephone Encounter (Addendum)
Patient Advocate Encounter   Received notification from [employer] that prior authorization for VICTOZA is required.   PA submitted on 11/09/2020 Key [id number] Status is APPROVED  [facility] 11/09/2021  PA#:  [id number]    [facility] will continue to follow   Jeannette How, CPhT Patient Advocate [facility] Phone: [phone] Fax:  [phone]

## 2020-11-10 ENCOUNTER — Other Ambulatory Visit: Payer: Self-pay

## 2020-11-13 ENCOUNTER — Other Ambulatory Visit (HOSPITAL_COMMUNITY): Payer: Self-pay

## 2020-11-27 ENCOUNTER — Ambulatory Visit: Payer: Self-pay | Admitting: Nurse Practitioner

## 2020-12-04 ENCOUNTER — Other Ambulatory Visit: Payer: Self-pay

## 2020-12-04 MED FILL — Sildenafil Citrate Tab 100 MG: ORAL | 23 days supply | Qty: 6 | Fill #1 | Status: AC

## 2020-12-04 MED FILL — Insulin Lispro Prot & Lispro Sus Pen-inj 100 Unit/ML (75-25): SUBCUTANEOUS | 30 days supply | Qty: 21 | Fill #5 | Status: AC

## 2020-12-07 ENCOUNTER — Other Ambulatory Visit: Payer: Self-pay

## 2020-12-10 ENCOUNTER — Other Ambulatory Visit: Payer: Self-pay | Admitting: Internal Medicine

## 2021-01-07 ENCOUNTER — Ambulatory Visit (INDEPENDENT_AMBULATORY_CARE_PROVIDER_SITE_OTHER): Payer: 59 | Admitting: Nurse Practitioner

## 2021-01-07 ENCOUNTER — Encounter: Payer: Self-pay | Admitting: Nurse Practitioner

## 2021-01-07 ENCOUNTER — Other Ambulatory Visit: Payer: Self-pay

## 2021-01-07 VITALS — BP 153/92 | HR 92 | Temp 97.3°F | Ht >= 80 in | Wt 305.1 lb

## 2021-01-07 DIAGNOSIS — R030 Elevated blood-pressure reading, without diagnosis of hypertension: Secondary | ICD-10-CM

## 2021-01-07 DIAGNOSIS — Z1159 Encounter for screening for other viral diseases: Secondary | ICD-10-CM

## 2021-01-07 DIAGNOSIS — E781 Pure hyperglyceridemia: Secondary | ICD-10-CM

## 2021-01-07 DIAGNOSIS — N5319 Other ejaculatory dysfunction: Secondary | ICD-10-CM

## 2021-01-07 DIAGNOSIS — Z23 Encounter for immunization: Secondary | ICD-10-CM

## 2021-01-07 NOTE — Progress Notes (Signed)
[place], [place]  [address] Phone:  [phone]   Fax:  [phone]   Established Patient Office Visit  Subjective:  Patient ID: Jonathan [name], male    DOB: 02/14/1981  Age: 40 y.o. MRN: [id number]  CC:  Chief Complaint  Patient presents with   Follow-up    6 month follow up, flu shot    HPI Jonathan [name] presents for follow up. He  has a past medical history of Amputation of left great toe ([place]) (01/2019), Anxiety, Decreased libido (04/2019), Diabetes mellitus, Erectile dysfunction (04/2019), Hyperglycemia (09/2019), Low testosterone in male (04/2019), Proteinuria (01/07/2020), and Vitamin D deficiency (04/2019).   He reports that he was told by NP Clovis Riley to discontinue the liptor due to cough.  He has a diagnosis of diabetes he is prescribed Farxiga 5 mg, Lantus 50 units nightly and Humalog mix 75//25 units twice daily and Victoza 1.8 mg daily.  He is currently not taking the [place].  He does monitor his CBG with a continuous monitoring device his range is 46-201.  This is followed by endocrinology. He feels like this this happens once per week. He does try to recover this with food. He feels like he seating   He currently has an elevated blood pressure.  He reports that he does not monitor his blood pressure.  He is currently not prescribed any medication for elevated BP. Denies headache, dizziness, visual changes, shortness of breath, dyspnea on exertion, chest pain, nausea, vomiting or any edema.    Past Medical History:  Diagnosis Date   Amputation of left great toe ([place]) 01/2019   Anxiety    Decreased libido 04/2019   Diabetes mellitus    Erectile dysfunction 04/2019   Hyperglycemia 09/2019   Low testosterone in male 04/2019   Proteinuria 01/07/2020   Vitamin D deficiency 04/2019    Past Surgical History:  Procedure Laterality Date   AMPUTATION Left 02/02/2018   Procedure: LEFT GREAT TOE AMPUTATION;  Surgeon:  Newt Minion, MD;  Location: [facility];  Service: Orthopedics;  Laterality: Left;   AMPUTATION Left 02/08/2019   Procedure: LEFT FOOT 1ST RAY AMPUTATION;  Surgeon: Newt Minion, MD;  Location: [facility];  Service: Orthopedics;  Laterality: Left;    Family History  Problem Relation Age of Onset   Other Mother        Healthy   Other Father        Healthy    Social History   Socioeconomic History   Marital status: Single    Spouse name: Not on file   Number of children: Not on file   Years of education: Not on file   Highest education level: Not on file  Occupational History   Not on file  Tobacco Use   Smoking status: Never   Smokeless tobacco: Never  Vaping Use   Vaping Use: Never used  Substance and Sexual Activity   Alcohol use: Yes    Alcohol/week: 2.0 standard drinks    Types: 1 Glasses of wine, 1 Cans of beer per week    Comment: occassional   Drug use: No   Sexual activity: Yes  Other Topics Concern   Not on file  Social History Narrative   Not on file   Social Determinants of Health   Financial Resource Strain: Not on file  Food Insecurity: Not on file  Transportation Needs: Not on file  Physical Activity: Not on file  Stress: Not  on file  Social Connections: Not on file  Intimate Partner Violence: Not on file    Outpatient Medications Prior to Visit  Medication Sig Dispense Refill   atorvastatin (LIPITOR) 10 MG tablet Take 1 tablet (10 mg total) by mouth daily. 30 tablet 11   blood glucose meter kit and supplies Dispense based on patient and insurance preference. Use up to four times daily as directed. (FOR ICD-10 E10.9, E11.9). 1 each 0   Blood Glucose Monitoring Suppl (ONETOUCH VERIO) w/Device KIT Inject 1 Units into the skin QID. Use meter 4 times daily as directed 1 kit 0   clomiPHENE (CLOMID) 50 MG tablet Take 1 tablet by mouth once daily 30 tablet 0   gabapentin (NEURONTIN) 300 MG capsule Take 1 capsule (300 mg total) by mouth at bedtime. 30 capsule  11   glucose blood (ONETOUCH VERIO) test strip Use as instructed 100 each 11   ibuprofen (ADVIL) 800 MG tablet Take 1 tablet (800 mg total) by mouth every 8 (eight) hours as needed. 30 tablet 6   insulin glargine (LANTUS SOLOSTAR) 100 UNIT/ML Solostar Pen Inject 50 Units into the skin at bedtime. 45 mL 3   Lancets (ONETOUCH ULTRASOFT) lancets Use 4 times daily as directed  E11.42, Z79.4 100 each 11   liraglutide (VICTOZA) 18 MG/3ML SOPN Inject 1.8 mg into the skin daily in the afternoon. 27 mL 3   sildenafil (VIAGRA) 100 MG tablet Take 1 tablet (100 mg total) by mouth daily as needed for erectile dysfunction. 10 tablet 6   Insulin Lispro Prot & Lispro (HUMALOG 75/25 MIX) (75-25) 100 UNIT/ML Kwikpen INJECT 35 UNITS INTO THE SKIN 2 (TWO) TIMES DAILY. 20 mL 11   dapagliflozin propanediol (FARXIGA) 5 MG TABS tablet Take 1 tablet (5 mg total) by mouth daily before breakfast. (Patient not taking: Reported on 01/07/2021) 90 tablet 1   atorvastatin (LIPITOR) 10 MG tablet TAKE 1 TABLET (10 MG TOTAL) BY MOUTH DAILY. 30 tablet 11   gabapentin (NEURONTIN) 300 MG capsule TAKE 1 CAPSULE (300 MG TOTAL) BY MOUTH AT BEDTIME. 30 capsule 11   liraglutide (VICTOZA) 18 MG/3ML SOPN TAKE 1.2MG ONCE A DAY 3 mL 11   sildenafil (VIAGRA) 100 MG tablet TAKE 1 TABLET (100 MG TOTAL) BY MOUTH DAILY AS NEEDED FOR ERECTILE DYSFUNCTION. 10 tablet 6   Vitamin D, Ergocalciferol, (DRISDOL) 1.25 MG (50000 UNIT) CAPS capsule Take 1 capsule (50,000 Units total) by mouth every 7 (seven) days. 5 capsule 2   Vitamin D, Ergocalciferol, (DRISDOL) 1.25 MG (50000 UNIT) CAPS capsule TAKE 1 CAPSULE (50,000 UNITS TOTAL) BY MOUTH EVERY 7 (SEVEN) DAYS. 5 capsule 2   No facility-administered medications prior to visit.    No Known Allergies  ROS Review of Systems  Genitourinary:  Negative for penile discharge, penile pain, penile swelling, scrotal swelling and testicular pain.       Blank ejaculation  Occasional hesitancy  Does have a  sensation with urination with changes in diet  Denies family history of prostate     Objective:    Physical Exam  BP (!) 153/92 (BP Location: Right Arm, Patient Position: Sitting)   Pulse 92   Temp (!) 97.3 F (36.3 C)   Ht '6\' 8"'  (2.032 m)   Wt (!) 305 lb 0.8 oz (138.4 kg)   SpO2 100%   BMI 33.51 kg/m  Wt Readings from Last 3 Encounters:  01/07/21 (!) 305 lb 0.8 oz (138.4 kg)  05/26/20 (!) 302 lb (137 kg)  03/09/20 292  lb 6 oz (132.6 kg)     Health Maintenance Due  Topic Date Due   COVID-19 Vaccine (3 - Booster for Moderna series) 10/30/2020   Pneumococcal Vaccine 61-8 Years old (2 - PPSV23 if available, else PCV20) 01/06/2021   URINE MICROALBUMIN  01/06/2021    There are no preventive care reminders to display for this patient.  Lab Results  Component Value Date   TSH 1.380 05/26/2020   Lab Results  Component Value Date   WBC 5.2 10/21/2020   HGB 13.7 10/21/2020   HCT 42.9 10/21/2020   MCV 77.6 (L) 10/21/2020   PLT 141.0 (L) 10/21/2020   Lab Results  Component Value Date   NA 141 10/21/2020   K 4.3 10/21/2020   CO2 27 10/21/2020   GLUCOSE 83 10/21/2020   BUN 14 10/21/2020   CREATININE 1.04 10/21/2020   BILITOT 0.3 10/21/2020   ALKPHOS 59 10/21/2020   AST 27 10/21/2020   ALT 30 10/21/2020   PROT 7.4 10/21/2020   ALBUMIN 4.0 10/21/2020   CALCIUM 9.9 10/21/2020   ANIONGAP 11 03/10/2018   EGFR 95 05/26/2020   GFR 90.93 10/21/2020   Lab Results  Component Value Date   CHOL 119 05/26/2020   Lab Results  Component Value Date   HDL 40 05/26/2020   Lab Results  Component Value Date   LDLCALC 51 05/26/2020   Lab Results  Component Value Date   TRIG 165 (H) 05/26/2020   With lab Results  Component Value Date   CHOLHDL 3.0 05/26/2020   Lab Results  Component Value Date   HGBA1C 7.9 (A) 10/23/2020      Assessment & Plan:   Problem List Items Addressed This Visit   None Visit Diagnoses     Hypertriglyceridemia    -   Primary Reevaluation pending Currently on atorvastatin 10 mg daily   Relevant Orders   Lipid panel (Completed)   Elevated BP without diagnosis of hypertension     Long discussion with patient on the importance of maintaining the BP less than 130/80 weight you have a diagnosis of diabetes to minimize the risk of stroke and heart attack.  Patient will start to monitor blood pressure and follow-up if Blood pressures remain elevated   Encounter for hepatitis C screening test for low risk patient       Relevant Orders   Hepatitis C antibody (Completed)   Abnormal ejaculation     Referral to neurology for what patient describes as "blank ejaculation"   Relevant Orders   Ambulatory referral to Urology       No orders of the defined types were placed in this encounter.   Follow-up: Return in about 6 months (around 07/07/2021) for elevated BP HLD [id number].    Vevelyn Francois, NP

## 2021-01-07 NOTE — Patient Instructions (Addendum)
Health Maintenance, Male Adopting a healthy lifestyle and getting preventive care are important in promoting health and wellness. Ask your health care provider about: The right schedule for you to have regular tests and exams. Things you can do on your own to prevent diseases and keep yourself healthy. What should I know about diet, weight, and exercise? Eat a healthy diet  Eat a diet that includes plenty of vegetables, fruits, low-fat dairy products, and lean protein. Do not eat a lot of foods that are high in solid fats, added sugars, or sodium. Maintain a healthy weight Body mass index (BMI) is a measurement that can be used to identify possible weight problems. It estimates body fat based on height and weight. Your health care provider can help determine your BMI and help you achieve or maintain a healthy weight. Get regular exercise Get regular exercise. This is one of the most important things you can do for your health. Most adults should: Exercise for at least 150 minutes each week. The exercise should increase your heart rate and make you sweat (moderate-intensity exercise). Do strengthening exercises at least twice a week. This is in addition to the moderate-intensity exercise. Spend less time sitting. Even light physical activity can be beneficial. Watch cholesterol and blood lipids Have your blood tested for lipids and cholesterol at 39 years of age, then have this test every 5 years. You may need to have your cholesterol levels checked more often if: Your lipid or cholesterol levels are high. You are older than 40 years of age. You are at high risk for heart disease. What should I know about cancer screening? Many types of cancers can be detected early and may often be prevented. Depending on your health history and family history, you may need to have cancer screening at various ages. This may include screening for: Colorectal cancer. Prostate cancer. Skin cancer. Lung  cancer. What should I know about heart disease, diabetes, and high blood pressure? Blood pressure and heart disease High blood pressure causes heart disease and increases the risk of stroke. This is more likely to develop in people who have high blood pressure readings or are overweight. Talk with your health care provider about your target blood pressure readings. Have your blood pressure checked: Every 3-5 years if you are 18-39 years of age. Every year if you are 40 years old or older. If you are between the ages of 65 and 75 and are a current or former smoker, ask your health care provider if you should have a one-time screening for abdominal aortic aneurysm (AAA). Diabetes Have regular diabetes screenings. This checks your fasting blood sugar level. Have the screening done: Once every three years after age 45 if you are at a normal weight and have a low risk for diabetes. More often and at a younger age if you are overweight or have a high risk for diabetes. What should I know about preventing infection? Hepatitis B If you have a higher risk for hepatitis B, you should be screened for this virus. Talk with your health care provider to find out if you are at risk for hepatitis B infection. Hepatitis C Blood testing is recommended for: Everyone born from 1945 through 1965. Anyone with known risk factors for hepatitis C. Sexually transmitted infections (STIs) You should be screened each year for STIs, including gonorrhea and chlamydia, if: You are sexually active and are younger than 39 years of age. You are older than 39 years of age and your   health care provider tells you that you are at risk for this type of infection. Your sexual activity has changed since you were last screened, and you are at increased risk for chlamydia or gonorrhea. Ask your health care provider if you are at risk. Ask your health care provider about whether you are at high risk for HIV. Your health care provider  may recommend a prescription medicine to help prevent HIV infection. If you choose to take medicine to prevent HIV, you should first get tested for HIV. You should then be tested every 3 months for as long as you are taking the medicine. Follow these instructions at home: Alcohol use Do not drink alcohol if your health care provider tells you not to drink. If you drink alcohol: Limit how much you have to 0-2 drinks a day. Know how much alcohol is in your drink. In the [place] one drink equals one 12 oz bottle of beer (355 mL), one 5 oz glass of wine (148 mL), or one 1 oz glass of hard liquor (44 mL). Lifestyle Do not use any products that contain nicotine or tobacco. These products include cigarettes, chewing tobacco, and vaping devices, such as e-cigarettes. If you need help quitting, ask your health care provider. Do not use street drugs. Do not share needles. Ask your health care provider for help if you need support or information about quitting drugs. General instructions Schedule regular health, dental, and eye exams. Stay current with your vaccines. Tell your health care provider if: You often feel depressed. You have ever been abused or do not feel safe at home. Summary Adopting a healthy lifestyle and getting preventive care are important in promoting health and wellness. Follow your health care provider's instructions about healthy diet, exercising, and getting tested or screened for diseases. Follow your health care provider's instructions on monitoring your cholesterol and blood pressure. This information is not intended to replace advice given to you by your health care provider. Make sure you discuss any questions you have with your health care provider. Document Revised: 07/06/2020 Document Reviewed: 07/06/2020 Elsevier Patient Education  2022 Elsevier Inc.   Managing Your Hypertension Hypertension, also called high blood pressure, is when the force of the blood pressing  against the walls of the arteries is too strong. Arteries are blood vessels that carry blood from your heart throughout your body. Hypertension forces the heart to work harder to pump blood and may cause the arteries to become narrow or stiff. Understanding blood pressure readings Your personal target blood pressure may vary depending on your medical conditions, your age, and other factors. A blood pressure reading includes a higher number over a lower number. Ideally, your blood pressure should be below 120/80. You should know that: The first, or top, number is called the systolic pressure. It is a measure of the pressure in your arteries as your heart beats. The second, or bottom number, is called the diastolic pressure. It is a measure of the pressure in your arteries as the heart relaxes. Blood pressure is classified into four stages. Based on your blood pressure reading, your health care provider may use the following stages to determine what type of treatment you need, if any. Systolic pressure and diastolic pressure are measured in a unit called mmHg. Normal Systolic pressure: below 120. Diastolic pressure: below 80. Elevated Systolic pressure: 120-129. Diastolic pressure: below 80. Hypertension stage 1 Systolic pressure: 130-139. Diastolic pressure: 80-89. Hypertension stage 2 Systolic pressure: 140 or above. Diastolic pressure: 90   or above. How can this condition affect me? Managing your hypertension is an important responsibility. Over time, hypertension can damage the arteries and decrease blood flow to important parts of the body, including the brain, heart, and kidneys. Having untreated or uncontrolled hypertension can lead to: A heart attack. A stroke. A weakened blood vessel (aneurysm). Heart failure. Kidney damage. Eye damage. Metabolic syndrome. Memory and concentration problems. Vascular dementia. What actions can I take to manage this condition? Hypertension can be  managed by making lifestyle changes and possibly by taking medicines. Your health care provider will help you make a plan to bring your blood pressure within a normal range. Nutrition  Eat a diet that is high in fiber and potassium, and low in salt (sodium), added sugar, and fat. An example eating plan is called the Dietary Approaches to Stop Hypertension (DASH) diet. To eat this way: Eat plenty of fresh fruits and vegetables. Try to fill one-half of your plate at each meal with fruits and vegetables. Eat whole grains, such as whole-wheat pasta, brown rice, or whole-grain bread. Fill about one-fourth of your plate with whole grains. Eat low-fat dairy products. Avoid fatty cuts of meat, processed or cured meats, and poultry with skin. Fill about one-fourth of your plate with lean proteins such as fish, chicken without skin, beans, eggs, and tofu. Avoid pre-made and processed foods. These tend to be higher in sodium, added sugar, and fat. Reduce your daily sodium intake. Most people with hypertension should eat less than 1,500 mg of sodium a day. Lifestyle  Work with your health care provider to maintain a healthy body weight or to lose weight. Ask what an ideal weight is for you. Get at least 30 minutes of exercise that causes your heart to beat faster (aerobic exercise) most days of the week. Activities may include walking, swimming, or biking. Include exercise to strengthen your muscles (resistance exercise), such as weight lifting, as part of your weekly exercise routine. Try to do these types of exercises for 30 minutes at least 3 days a week. Do not use any products that contain nicotine or tobacco, such as cigarettes, e-cigarettes, and chewing tobacco. If you need help quitting, ask your health care provider. Control any long-term (chronic) conditions you have, such as high cholesterol or diabetes. Identify your sources of stress and find ways to manage stress. This may include meditation, deep  breathing, or making time for fun activities. Alcohol use Do not drink alcohol if: Your health care provider tells you not to drink. You are pregnant, may be pregnant, or are planning to become pregnant. If you drink alcohol: Limit how much you use to: 0-1 drink a day for women. 0-2 drinks a day for men. Be aware of how much alcohol is in your drink. In the [place] one drink equals one 12 oz bottle of beer (355 mL), one 5 oz glass of wine (148 mL), or one 1 oz glass of hard liquor (44 mL). Medicines Your health care provider may prescribe medicine if lifestyle changes are not enough to get your blood pressure under control and if: Your systolic blood pressure is 130 or higher. Your diastolic blood pressure is 80 or higher. Take medicines only as told by your health care provider. Follow the directions carefully. Blood pressure medicines must be taken as told by your health care provider. The medicine does not work as well when you skip doses. Skipping doses also puts you at risk for problems. Monitoring Before you monitor your   blood pressure: Do not smoke, drink caffeinated beverages, or exercise within 30 minutes before taking a measurement. Use the bathroom and empty your bladder (urinate). Sit quietly for at least 5 minutes before taking measurements. Monitor your blood pressure at home as told by your health care provider. To do this: Sit with your back straight and supported. Place your feet flat on the floor. Do not cross your legs. Support your arm on a flat surface, such as a table. Make sure your upper arm is at heart level. Each time you measure, take two or three readings one minute apart and record the results. You may also need to have your blood pressure checked regularly by your health care provider. General information Talk with your health care provider about your diet, exercise habits, and other lifestyle factors that may be contributing to hypertension. Review all the  medicines you take with your health care provider because there may be side effects or interactions. Keep all visits as told by your health care provider. Your health care provider can help you create and adjust your plan for managing your high blood pressure. Where to find more information National Heart, Lung, and Blood Institute: www.nhlbi.nih.gov American Heart Association: www.heart.org Contact a health care provider if: You think you are having a reaction to medicines you have taken. You have repeated (recurrent) headaches. You feel dizzy. You have swelling in your ankles. You have trouble with your vision. Get help right away if: You develop a severe headache or confusion. You have unusual weakness or numbness, or you feel faint. You have severe pain in your chest or abdomen. You vomit repeatedly. You have trouble breathing. These symptoms may represent a serious problem that is an emergency. Do not wait to see if the symptoms will go away. Get medical help right away. Call your local emergency services (911 in the [place] Do not drive yourself to the hospital. Summary Hypertension is when the force of blood pumping through your arteries is too strong. If this condition is not controlled, it may put you at risk for serious complications. Your personal target blood pressure may vary depending on your medical conditions, your age, and other factors. For most people, a normal blood pressure is less than 120/80. Hypertension is managed by lifestyle changes, medicines, or both. Lifestyle changes to help manage hypertension include losing weight, eating a healthy, low-sodium diet, exercising more, stopping smoking, and limiting alcohol. This information is not intended to replace advice given to you by your health care provider. Make sure you discuss any questions you have with your health care provider. Document Revised: 03/04/2019 Document Reviewed: 01/15/2019 Elsevier Patient Education   2022 Elsevier Inc.  

## 2021-01-08 ENCOUNTER — Other Ambulatory Visit: Payer: Self-pay

## 2021-01-08 ENCOUNTER — Other Ambulatory Visit: Payer: Self-pay | Admitting: Nurse Practitioner

## 2021-01-08 ENCOUNTER — Encounter: Payer: Self-pay | Admitting: Nurse Practitioner

## 2021-01-08 LAB — LIPID PANEL
Chol/HDL Ratio: 3.4 ratio (ref 0.0–5.0)
Cholesterol, Total: 171 mg/dL (ref 100–199)
HDL: 50 mg/dL (ref >39)
LDL Chol Calc (NIH): 84 mg/dL (ref 0–99)
Triglycerides: 219 mg/dL — ABNORMAL HIGH (ref 0–149)
VLDL Cholesterol Cal: 37 mg/dL (ref 5–40)

## 2021-01-08 LAB — HEPATITIS C ANTIBODY: Hep C Virus Ab: <0.1 s/co ratio (ref 0.0–0.9)

## 2021-01-08 MED ORDER — INSULIN LISPRO PROT & LISPRO (75-25 MIX) 100 UNIT/ML KWIKPEN
PEN_INJECTOR | SUBCUTANEOUS | 11 refills | Status: DC
  Filled 2021-01-08: qty 21, 30d supply, fill #0
  Filled 2021-02-25: qty 21, 30d supply, fill #1
  Filled 2021-04-09 (×2): qty 21, 30d supply, fill #0

## 2021-02-25 ENCOUNTER — Other Ambulatory Visit: Payer: Self-pay

## 2021-02-25 MED ORDER — CLOMIPHENE CITRATE 50 MG PO TABS
ORAL_TABLET | ORAL | 3 refills | Status: DC
  Filled 2021-02-25 – 2021-04-09 (×2): qty 15, 30d supply, fill #0
  Filled 2021-05-17 – 2021-07-04 (×2): qty 15, 30d supply, fill #1
  Filled 2021-08-11: qty 15, 30d supply, fill #2

## 2021-02-25 MED ORDER — TADALAFIL 5 MG PO TABS
ORAL_TABLET | ORAL | 0 refills | Status: DC
  Filled 2021-02-25 – 2021-04-09 (×2): qty 6, 23d supply, fill #0
  Filled 2021-05-17 (×2): qty 6, 23d supply, fill #1
  Filled 2021-06-08 – 2021-06-15 (×2): qty 6, 23d supply, fill #2
  Filled 2021-07-04: qty 6, 23d supply, fill #3

## 2021-02-26 ENCOUNTER — Other Ambulatory Visit: Payer: Self-pay

## 2021-03-03 ENCOUNTER — Ambulatory Visit: Payer: 59 | Admitting: Internal Medicine

## 2021-03-03 NOTE — Progress Notes (Deleted)
Name: Jonathan [name]  MRN/ DOB: [id number], [birth year]    Age/ Sex: 41 y.o., male     PCP: Jonathan Francois, NP   Reason for Endocrinology Evaluation: Hypogonadism      Initial Endocrinology Clinic Visit: 07/15/2019      PATIENT IDENTIFIER: Jonathan [name] is a 41 y.o., male with a past medical history of T2DM, and Hypogonadism. He has followed with [facility] since 07/15/2019 for consultative assistance with management of his Hypogonadism .      HISTORICAL SUMMARY: The patient was first diagnosed with   hypogonadism in 2016. He has had multiple testosterone readings with a nadir of 125 ng/dL, as well as low sex hormone binding globulin. He was on Testosterone Cypionate at some point.   No hx of long term narcotic or illicit drug use.  No hx of head trauma  No of testicular childhood infection that he could recall.  No FH of prostate cancer    Testicular size on initial presentation 20 mL bilaterally, with a total testosterone of 167 ng/dL, inappropriately normal FSH and LH 3.4 and 7.54 mIU/mL respectively. Normal prolactin, TFT and cortisol.   MRI of brain did NOT show any pituitary pathology (08/22/2019)   He was prescribed Clomiphene 50 mg daily which was started 08/2019. Repeat testosterone increased from 155 ng/dL to 260 ng/dL.   MRI showed normal pituitary gland      DIABETES HISTORY:  Jonathan [name]  was diagnosed with DM in 2014, He is on basal insulin and Victoza with continued hyperglycemia. His hemoglobin A1c has ranged from 9.8% in 2021, peaking at >15.5% in 2019.  Metofrmin causes diarrhea   SUBJECTIVE:   Today (03/03/2021):  Jonathan [name] is here for a follow up on hypogonadotropic hypogonadism and DM management  . He has been on Clomiphene for a few months without side effects    Has normal libido  He continue with dry ejaculation  Energy level has been improving , he has erections but not lasting long enough   He checks his glucose at  home through dexcom , he gets his dexcom through work  program     HOME ENDOCRINE MEDICATION  Clomiphene 50 mg daily  Lantus 50 units daily  Victoza 1.2 mg daily  Humalog MIX 25 units BID Farxiga 5 mg daily         HISTORY:  Past Medical History:  Past Medical History:  Diagnosis Date   Amputation of left great toe ([place]) 01/2019   Anxiety    Decreased libido 04/2019   Diabetes mellitus    Erectile dysfunction 04/2019   Hyperglycemia 09/2019   Low testosterone in male 04/2019   Proteinuria 01/07/2020   Vitamin D deficiency 04/2019   Past Surgical History:  Past Surgical History:  Procedure Laterality Date   AMPUTATION Left 02/02/2018   Procedure: LEFT GREAT TOE AMPUTATION;  Surgeon: Jonathan Minion, MD;  Location: [facility];  Service: Orthopedics;  Laterality: Left;   AMPUTATION Left 02/08/2019   Procedure: LEFT FOOT 1ST RAY AMPUTATION;  Surgeon: Jonathan Minion, MD;  Location: [facility];  Service: Orthopedics;  Laterality: Left;   Social History:  reports that he has never smoked. He has never used smokeless tobacco. He reports current alcohol use of about 2.0 standard drinks per week. He reports that he does not use drugs. Family History:  Family History  Problem Relation Age of Onset   Other Mother        Healthy  Other Father        Healthy     HOME MEDICATIONS: Allergies as of 03/03/2021   No Known Allergies      Medication List        Accurate as of March 03, 2021  7:43 AM. If you have any questions, ask your nurse or doctor.          atorvastatin 10 MG tablet Commonly known as: LIPITOR Take 1 tablet (10 mg total) by mouth daily.   blood glucose meter kit and supplies Dispense based on patient and insurance preference. Use up to four times daily as directed. (FOR ICD-10 E10.9, E11.9).   clomiPHENE 50 MG tablet Commonly known as: CLOMID Take 1 tablet by mouth once daily   clomiPHENE 50 MG tablet Commonly known as: CLOMID take 1/2 tab by mouth  once daily   dapagliflozin propanediol 5 MG Tabs tablet Commonly known as: Farxiga Take 1 tablet (5 mg total) by mouth daily before breakfast.   gabapentin 300 MG capsule Commonly known as: NEURONTIN Take 1 capsule (300 mg total) by mouth at bedtime.   HumaLOG Mix 75/25 KwikPen (75-25) 100 UNIT/ML Kwikpen Generic drug: Insulin Lispro Prot & Lispro INJECT 35 UNITS INTO THE SKIN 2 (TWO) TIMES DAILY.   ibuprofen 800 MG tablet Commonly known as: ADVIL Take 1 tablet (800 mg total) by mouth every 8 (eight) hours as needed.   Lantus SoloStar 100 UNIT/ML Solostar Pen Generic drug: insulin glargine Inject 50 Units into the skin at bedtime.   onetouch ultrasoft lancets Use 4 times daily as directed  E11.42, Z79.4   OneTouch Verio test strip Generic drug: glucose blood Use as instructed   OneTouch Verio w/Device Kit Inject 1 Units into the skin QID. Use meter 4 times daily as directed   sildenafil 100 MG tablet Commonly known as: VIAGRA Take 1 tablet (100 mg total) by mouth daily as needed for erectile dysfunction.   tadalafil 5 MG tablet Commonly known as: CIALIS 1 tablet by once daily   Victoza 18 MG/3ML Sopn Generic drug: liraglutide Inject 1.8 mg into the skin daily in the afternoon.          OBJECTIVE:   PHYSICAL EXAM: VS: There were no vitals taken for this visit.    EXAM: General: Pt appears well and is in NAD  Neck: General: Supple without adenopathy. Thyroid: Thyroid size normal.  No goiter or nodules appreciated. No thyroid bruit.  Lungs: Clear with good BS bilat with no rales, rhonchi, or wheezes  Heart: Auscultation: RRR.  Abdomen: Normoactive bowel sounds, soft, nontender, without masses or organomegaly palpable  Extremities:  BL LE: No pretibial edema normal ROM and strength.  Mental Status: Judgment, insight: Intact Orientation: Oriented to time, place, and person Mood and affect: No depression, anxiety, or agitation     DATA  REVIEWED: Results for Jonathan [name], Jonathan [name] (MRN [id number]) as of 10/23/2020 09:01  Ref. Range 10/21/2020 10:39  Sodium Latest Ref Range: 135 - 145 mEq/L 141  Potassium Latest Ref Range: 3.5 - 5.1 mEq/L 4.3  Chloride Latest Ref Range: 96 - 112 mEq/L 106  CO2 Latest Ref Range: 19 - 32 mEq/L 27  Glucose Latest Ref Range: 70 - 99 mg/dL 83  BUN Latest Ref Range: 6 - 23 mg/dL 14  Creatinine Latest Ref Range: 0.40 - 1.50 mg/dL 1.04  Calcium Latest Ref Range: 8.4 - 10.5 mg/dL 9.9  Alkaline Phosphatase Latest Ref Range: 39 - 117 U/L 59  Albumin Latest Ref Range: 3.5 -  5.2 g/dL 4.0  AST Latest Ref Range: 0 - 37 U/L 27  ALT Latest Ref Range: 0 - 53 U/L 30  Total Protein Latest Ref Range: 6.0 - 8.3 g/dL 7.4  Total Bilirubin Latest Ref Range: 0.2 - 1.2 mg/dL 0.3  GFR Latest Ref Range: >60.00 mL/min 90.93  WBC Latest Ref Range: 4.0 - 10.5 K/uL 5.2  RBC Latest Ref Range: 4.22 - 5.81 Mil/uL 5.52  Hemoglobin Latest Ref Range: 13.0 - 17.0 g/dL 13.7  HCT Latest Ref Range: 39.0 - 52.0 % 42.9  MCV Latest Ref Range: 78.0 - 100.0 fl 77.6 (L)  MCHC Latest Ref Range: 30.0 - 36.0 g/dL 31.9  RDW Latest Ref Range: 11.5 - 15.5 % 14.6  Platelets Latest Ref Range: 150.0 - 400.0 K/uL 141.0 (L)  Testosterone Latest Ref Range: 300.00 - 890.00 ng/dL 270.13 (L)    MRI Brain 08/22/2019   Brain: Dynamic pituitary protocol. The sella is shallow. The pituitary is normal in height 5.4 mm. The pituitary enhances homogeneously. No microadenoma or macro adenoma. Infundibulum midline. Normal cavernous sinus and optic chiasm.   Ventricle size normal. Cerebral volume normal. Negative for acute infarct hemorrhage or mass. Few small white matter hyperintensities bilaterally appear chronic.   Vascular: Normal arterial flow voids   Skull and upper cervical spine: Normal bone marrow.   Sinuses/Orbits: Mild mucosal edema paranasal sinuses.  Normal orbit   Other: None   IMPRESSION: Negative for pituitary lesion. The sella is  shallow however the pituitary is normal in size and enhances homogeneously.   No acute intracranial abnormality. Few small white matter hyperintensities bilaterally may reflect chronic microvascular ischemia. Correlate with risk factors for small vessel disease.  ASSESSMENT / PLAN / RECOMMENDATIONS:   Hypogonadotrophic Hypogonadism :  - Unknown cause, as he has no hx of head injury, or chronic narcotic or marijuana use.  - Pituitary MRI was unrevealing (07/2019) - Of note the pt was on testosterone therapy at some point in the past in the form of injectables. Unclear if this is the cause of his secondary hypogonadism  - Due to his intention to have children in the future, we have opted to treat with clomiphene. He understands this is an off label use.  -His testosterone continues to be below goal, but I am not going to change this yet.  The next option would be testosterone replacement therapy but he would like to avoid this for now. -Another option will be hCG therapy but will hold off on this until he is actively trying to have children.   Medications   Continue Clomiphene 50 mg daily    2. T2DM, with improving glycemic control:   -His A1c 7.9% -I am going to reduce his Humalog mix from 35 to 25 units in the morning.  I would prefer to switch him to plain Humalog if necessary in the future due to the increased risk of hypoglycemia with combination regimen of Humalog mix and basal insulin.  He has been taking Humalog mix every morning regardless whether he eats a meal or not, I have educated him about the importance of taking Humalog mix with first meal of the day at this time. -We discussed SGLT2 inhibitors, cautioned against genital infections  Medication - Continue Lantus 50 units daily  - Decrease HUmalog Mix to 25 units with Breakfast  -Increase Victoza 1.8 mg daily  - Start Farxiga 5 mg, 1 tablet every morning       F/U in 4 months  Signed electronically by: Mack Guise, MD  [facility]  [facility] [address] [place], [place] [address] Phone: [phone] FAX: [phone]      CC: Jonathan Francois, NP [address] [place] [address] Phone: [phone]  Fax: [phone]   Return to Endocrinology clinic as below: Future Appointments  Date Time Provider [place]  03/03/2021 10:50 AM Honour Schwieger, Melanie Crazier, MD [facility] None  07/07/2021  9:40 AM Jonathan Francois, NP [facility] None

## 2021-03-17 ENCOUNTER — Other Ambulatory Visit: Payer: Self-pay

## 2021-03-18 ENCOUNTER — Other Ambulatory Visit: Payer: Self-pay

## 2021-04-07 ENCOUNTER — Telehealth: Payer: Self-pay | Admitting: Family

## 2021-04-07 NOTE — Telephone Encounter (Signed)
Called pt 2x to set up appt with Erin per mychart message about swollen legs, vm not set up and unable to leave message

## 2021-04-09 ENCOUNTER — Other Ambulatory Visit: Payer: Self-pay

## 2021-04-12 ENCOUNTER — Other Ambulatory Visit: Payer: Self-pay

## 2021-04-13 ENCOUNTER — Ambulatory Visit (INDEPENDENT_AMBULATORY_CARE_PROVIDER_SITE_OTHER): Payer: 59 | Admitting: Family

## 2021-04-13 ENCOUNTER — Other Ambulatory Visit: Payer: Self-pay

## 2021-04-13 ENCOUNTER — Encounter: Payer: Self-pay | Admitting: Family

## 2021-04-13 DIAGNOSIS — L97521 Non-pressure chronic ulcer of other part of left foot limited to breakdown of skin: Secondary | ICD-10-CM | POA: Diagnosis not present

## 2021-04-13 DIAGNOSIS — M14679 Charcot's joint, unspecified ankle and foot: Secondary | ICD-10-CM | POA: Diagnosis not present

## 2021-04-13 NOTE — Progress Notes (Signed)
Office Visit Note   Patient: Jonathan [name]           Date of Birth: 04-05-1980           MRN: [id number] Visit Date: 04/13/2021              Requested by: Vevelyn Francois, NP [address] [place],  [place] [address] PCP: Vevelyn Francois, NP  Chief Complaint  Patient presents with   Left Foot - Edema      HPI: The patient is a 41 year old gentleman seen today for initial evaluation of new ulcers to his left foot.  He states that 2 weeks ago he was traveling and did much more walking than he is accustomed to he was having worse edema as well he noticed the bottom of his foot became macerated and did eventually ulcerate.  He has new ulcer beneath the second metatarsal head as well as along the medial column.  He is status post first ray amputation  Denies fevers chills no foul drainage  Assessment & Plan: Visit Diagnoses: No diagnosis found.  Plan: Given some Silvadene he will begin daily Dial soap cleansing.  Silvadene dressing changes.  Discussed using a Darco shoe, the patient would like to hold off at this time he will use felt pressure relieving donuts that we have given him today in the office  Follow-Up Instructions: No follow-ups on file.   Ortho Exam  Patient is alert, oriented, no adenopathy, well-dressed, normal affect, normal respiratory effort. On examination of the left foot today there is no edema he does have a 2 cm in diameter ulcer beneath the second metatarsal head this is filled in with granulation tissue.  There is no depth no probing no active drainage she does also have a 5 mm in diameter ulcer over his midfoot along the medial column this has no active drainage as well filled in with granulation there is no surrounding erythema no warmth has a strong dorsalis pedis pulse  Imaging: No results found. No images are attached to the encounter.  Labs: Lab Results  Component Value Date   HGBA1C 7.9 (A) 10/23/2020   HGBA1C 9.8 (A) 01/07/2020   HGBA1C  9.8 01/07/2020   HGBA1C 9.8 (A) 01/07/2020   HGBA1C 9.8 (A) 01/07/2020   ESRSEDRATE 5 01/30/2018   ESRSEDRATE 23 (H) 12/26/2016   ESRSEDRATE 1 12/05/2016   CRP <0.8 01/30/2018   CRP 5.8 (H) 12/26/2016   CRP <0.8 12/05/2016   REPTSTATUS 02/04/2018 FINAL 01/30/2018   GRAMSTAIN  12/26/2016    FEW WBC PRESENT, PREDOMINANTLY PMN ABUNDANT GRAM POSITIVE COCCI IN PAIRS ABUNDANT GRAM NEGATIVE RODS Performed at [facility], [place] [address] [place], [place] [address]    CULT  01/30/2018    NO GROWTH 5 DAYS Performed at [facility] [address], [place],  [address]      Lab Results  Component Value Date   ALBUMIN 4.0 10/21/2020   ALBUMIN 4.2 05/26/2020   ALBUMIN 4.1 02/05/2020    No results found for: MG Lab Results  Component Value Date   VD25OH 18.9 (L) 05/26/2020   VD25OH 8.8 (L) 04/29/2019    No results found for: PREALBUMIN CBC EXTENDED Latest Ref Rng & Units 10/21/2020 05/26/2020 02/05/2020  WBC 4.0 - 10.5 K/uL 5.2 5.4 5.2  RBC 4.22 - 5.81 Mil/uL 5.52 6.07(H) 5.76  HGB 13.0 - 17.0 g/dL 13.7 15.1 14.2  HCT 39.0 - 52.0 % 42.9 47.8 44.7  PLT 150.0 - 400.0 K/uL 141.0(L) 166 135.0(L)  NEUTROABS 1.4 - 7.0 x10E3/uL - 2.7 -  LYMPHSABS 0.7 - 3.1 x10E3/uL - 2.3 -     There is no height or weight on file to calculate BMI.  Orders:  No orders of the defined types were placed in this encounter.  No orders of the defined types were placed in this encounter.    Procedures: No procedures performed  Clinical Data: No additional findings.  ROS:  All other systems negative, except as noted in the HPI. Review of Systems  Objective: Vital Signs: There were no vitals taken for this visit.  Specialty Comments:  No specialty comments available.  PMFS History: Patient Active Problem List   Diagnosis Date Noted   Hypogonadotropic hypogonadism ([place]) 07/17/2019   Hemoglobin A1C between 7% and 9% indicating borderline diabetic control ([place]) 04/29/2019    Decreased libido 04/29/2019   Low testosterone in male 04/2019   Non-pressure chronic ulcer of other part of left foot with necrosis of bone ([place])    Cutaneous abscess of left foot    Hemoglobin A1C greater than 9%, indicating poor diabetic control 08/27/2018   Amputation of left great toe ([place]) 08/27/2018   Osteomyelitis of great toe of left foot ([place]) 01/30/2018   Osteomyelitis ([place]) 01/30/2018   Depression with anxiety 01/30/2018   Major depressive disorder, recurrent severe without psychotic features ([place]) 07/16/2017   OD (overdose of drug), intentional self-harm, initial encounter ([place]) 07/15/2017   Cellulitis 12/26/2016   Open toe wound, subsequent encounter 12/12/2016   Hyperglycemia 12/06/2016   Diabetic ulcer of left foot ([place]) 12/05/2016   Diabetic neuropathy ([place]) 10/16/2015   Erectile dysfunction 08/05/2015   Low testosterone 09/12/2014   DM type 2, uncontrolled, with neuropathy 06/12/2014   Diabetes mellitus, type II ([place]) 03/23/2012   Noncompliance 03/23/2012   Gastroenteritis and colitis, viral 03/23/2012   Past Medical History:  Diagnosis Date   Amputation of left great toe ([facility]) 01/2019   Anxiety    Decreased libido 04/2019   Diabetes mellitus    Erectile dysfunction 04/2019   Hyperglycemia 09/2019   Low testosterone in male 04/2019   Proteinuria 01/07/2020   Vitamin D deficiency 04/2019    Family History  Problem Relation Age of Onset   Other Mother        Healthy   Other Father        Healthy    Past Surgical History:  Procedure Laterality Date   AMPUTATION Left 02/02/2018   Procedure: LEFT GREAT TOE AMPUTATION;  Surgeon: Newt Minion, MD;  Location: [facility];  Service: Orthopedics;  Laterality: Left;   AMPUTATION Left 02/08/2019   Procedure: LEFT FOOT 1ST RAY AMPUTATION;  Surgeon: Newt Minion, MD;  Location: [facility];  Service: Orthopedics;  Laterality: Left;   Social History   Occupational History   Not on file  Tobacco Use   Smoking status:  Never   Smokeless tobacco: Never  Vaping Use   Vaping Use: Never used  Substance and Sexual Activity   Alcohol use: Yes    Alcohol/week: 2.0 standard drinks    Types: 1 Glasses of wine, 1 Cans of beer per week    Comment: occassional   Drug use: No   Sexual activity: Yes

## 2021-04-14 ENCOUNTER — Other Ambulatory Visit: Payer: Self-pay

## 2021-04-26 ENCOUNTER — Other Ambulatory Visit: Payer: Self-pay

## 2021-04-26 ENCOUNTER — Ambulatory Visit (INDEPENDENT_AMBULATORY_CARE_PROVIDER_SITE_OTHER): Payer: 59 | Admitting: Internal Medicine

## 2021-04-26 ENCOUNTER — Encounter: Payer: Self-pay | Admitting: Internal Medicine

## 2021-04-26 VITALS — BP 124/82 | HR 92 | Ht >= 80 in | Wt 304.0 lb

## 2021-04-26 DIAGNOSIS — R739 Hyperglycemia, unspecified: Secondary | ICD-10-CM

## 2021-04-26 DIAGNOSIS — R7989 Other specified abnormal findings of blood chemistry: Secondary | ICD-10-CM

## 2021-04-26 DIAGNOSIS — E1165 Type 2 diabetes mellitus with hyperglycemia: Secondary | ICD-10-CM

## 2021-04-26 DIAGNOSIS — Z794 Long term (current) use of insulin: Secondary | ICD-10-CM | POA: Diagnosis not present

## 2021-04-26 DIAGNOSIS — E118 Type 2 diabetes mellitus with unspecified complications: Secondary | ICD-10-CM

## 2021-04-26 DIAGNOSIS — R7309 Other abnormal glucose: Secondary | ICD-10-CM

## 2021-04-26 LAB — POCT GLYCOSYLATED HEMOGLOBIN (HGB A1C): Hemoglobin A1C: 8.8 % — AB (ref 4.0–5.6)

## 2021-04-26 MED ORDER — DAPAGLIFLOZIN PROPANEDIOL 5 MG PO TABS
5.0000 mg | ORAL_TABLET | Freq: Every day | ORAL | 1 refills | Status: DC
  Filled 2021-04-26 – 2021-06-08 (×5): qty 30, 30d supply, fill #0

## 2021-04-26 MED ORDER — VICTOZA 18 MG/3ML ~~LOC~~ SOPN
1.8000 mg | PEN_INJECTOR | Freq: Every day | SUBCUTANEOUS | 3 refills | Status: DC
  Filled 2021-04-26: qty 9, 30d supply, fill #0
  Filled 2021-05-17: qty 6, 23d supply, fill #0
  Filled 2021-05-17: qty 6, 20d supply, fill #0
  Filled 2021-06-08: qty 6, 20d supply, fill #1

## 2021-04-26 MED ORDER — LANTUS SOLOSTAR 100 UNIT/ML ~~LOC~~ SOPN
60.0000 [IU] | PEN_INJECTOR | Freq: Every day | SUBCUTANEOUS | 6 refills | Status: DC
  Filled 2021-04-26: qty 45, 75d supply, fill #0
  Filled 2021-05-17 (×2): qty 15, 25d supply, fill #0
  Filled 2021-06-08 – 2021-06-15 (×2): qty 15, 25d supply, fill #1
  Filled 2021-07-04: qty 15, 25d supply, fill #2
  Filled 2021-08-09: qty 15, 25d supply, fill #3

## 2021-04-26 MED ORDER — INSULIN LISPRO (1 UNIT DIAL) 100 UNIT/ML (KWIKPEN)
PEN_INJECTOR | SUBCUTANEOUS | 11 refills | Status: DC
  Filled 2021-04-26: qty 15, 25d supply, fill #0
  Filled 2021-05-17 (×2): qty 15, 25d supply, fill #1
  Filled 2021-06-08 – 2021-06-15 (×2): qty 15, 25d supply, fill #2
  Filled 2021-07-04: qty 15, 25d supply, fill #3
  Filled 2021-08-09: qty 15, 25d supply, fill #4
  Filled 2021-08-31 – 2021-09-30 (×2): qty 15, 25d supply, fill #5
  Filled 2021-10-26: qty 15, 25d supply, fill #6
  Filled 2021-11-22: qty 15, 25d supply, fill #7
  Filled 2021-11-25 – 2022-01-14 (×3): qty 15, 25d supply, fill #8
  Filled 2022-02-05 – 2022-03-11 (×3): qty 15, 25d supply, fill #9
  Filled 2022-04-03: qty 15, 25d supply, fill #10

## 2021-04-26 NOTE — Progress Notes (Signed)
Name: Jonathan [name]  MRN/ DOB: [id number], 25-Oct-1980    Age/ Sex: 41 y.o., male     PCP: Vevelyn Francois, NP   Reason for Endocrinology Evaluation: Hypogonadism      Initial Endocrinology Clinic Visit: 07/15/2019      PATIENT IDENTIFIER: [name] is a 41 y.o., male with a past medical history of T2DM, and Hypogonadism. He has followed with [facility] since 07/15/2019 for consultative assistance with management of his Hypogonadism .      HISTORICAL SUMMARY: The patient was first diagnosed with   hypogonadism in 2016. He has had multiple testosterone readings with a nadir of 125 ng/dL, as well as low sex hormone binding globulin. He was on Testosterone Cypionate at some point.   No hx of long term narcotic or illicit drug use.  No hx of head trauma  No of testicular childhood infection that he could recall.  No FH of prostate cancer    Testicular size on initial presentation 20 mL bilaterally, with a total testosterone of 167 ng/dL, inappropriately normal FSH and LH 3.4 and 7.54 mIU/mL respectively. Normal prolactin, TFT and cortisol.   MRI of brain did NOT show any pituitary pathology (08/22/2019)   He was prescribed Clomiphene 50 mg daily which was started 08/2019. Repeat testosterone increased from 155 ng/dL to 260 ng/dL.   MRI showed normal pituitary gland    Hypogonadism management has been taken  over by urology by 03/2021  DIABETES HISTORY:  [name]  was diagnosed with DM in 2014, He was on basal insulin , insulin mix and Victoza with continued hyperglycemia. His hemoglobin A1c has ranged from 9.8% in 2021, peaking at >15.5% in 2019.  Metofrmin causes diarrhea  Farxiga started 09/2020   SUBJECTIVE:   Today (04/26/2021):  [name] is here for a follow up on hypogonadotropic hypogonadism  . He has been on Clomiphene without side effects, he was seen by urology last week and was advised to take half a tablet of clomiphene?,  They also  referred him to infertility specialist He is not on farxiga  as its not approved per pt  He checks his glucose at home through dexcom , he gets his dexcom through work  program  Patient eats breakfast, and snacks the rest of the day, he avoids sugar sweetened beverages    HOME ENDOCRINE MEDICATION  Clomiphene 50 mg daily -by urology Lantus 50 units daily  Victoza 1.8 mg daily  Humalog MIX 25 units QAM - takes 35 units      CONTINUOUS GLUCOSE MONITORING RECORD INTERPRETATION    Dates of Recording: 2/14 - 04/26/2021  Sensor description: Dexcom  Results statistics:   CGM use % of time 86  Average and SD 218/66  Time in range     29   %  % Time Above 180 42  % Time above 250 28  % Time Below target 0      Glycemic patterns summary: Hyperglycemia noted during the day and night  Hyperglycemic episodes mainly postprandial  Hypoglycemic episodes occurred N/A  Overnight periods: High     HISTORY:  Past Medical History:  Past Medical History:  Diagnosis Date   Amputation of left great toe ([facility]) 01/2019   Anxiety    Decreased libido 04/2019   Diabetes mellitus    Erectile dysfunction 04/2019   Hyperglycemia 09/2019   Low testosterone in male 04/2019   Proteinuria 01/07/2020   Vitamin D deficiency 04/2019   Past  Surgical History:  Past Surgical History:  Procedure Laterality Date   AMPUTATION Left 02/02/2018   Procedure: LEFT GREAT TOE AMPUTATION;  Surgeon: Newt Minion, MD;  Location: [facility];  Service: Orthopedics;  Laterality: Left;   AMPUTATION Left 02/08/2019   Procedure: LEFT FOOT 1ST RAY AMPUTATION;  Surgeon: Newt Minion, MD;  Location: [facility];  Service: Orthopedics;  Laterality: Left;   Social History:  reports that he has never smoked. He has never used smokeless tobacco. He reports current alcohol use of about 2.0 standard drinks per week. He reports that he does not use drugs. Family History:  Family History  Problem Relation Age of Onset   Other  Mother        Healthy   Other Father        Healthy     HOME MEDICATIONS: Allergies as of 04/26/2021   No Known Allergies      Medication List        Accurate as of April 26, 2021  9:53 AM. If you have any questions, ask your nurse or doctor.          STOP taking these medications    HumaLOG Mix 75/25 KwikPen (75-25) 100 UNIT/ML Kwikpen Generic drug: Insulin Lispro Prot & Lispro Stopped by: Dorita Sciara, MD       TAKE these medications    atorvastatin 10 MG tablet Commonly known as: LIPITOR Take 1 tablet (10 mg total) by mouth daily.   blood glucose meter kit and supplies Dispense based on patient and insurance preference. Use up to four times daily as directed. (FOR ICD-10 E10.9, E11.9).   clomiPHENE 50 MG tablet Commonly known as: CLOMID Take 1 tablet by mouth once daily   clomiPHENE 50 MG tablet Commonly known as: CLOMID take 1/2 tab by mouth once daily   dapagliflozin propanediol 5 MG Tabs tablet Commonly known as: Farxiga Take 1 tablet (5 mg total) by mouth daily before breakfast.   gabapentin 300 MG capsule Commonly known as: NEURONTIN Take 1 capsule (300 mg total) by mouth at bedtime.   ibuprofen 800 MG tablet Commonly known as: ADVIL Take 1 tablet (800 mg total) by mouth every 8 (eight) hours as needed.   insulin lispro 100 UNIT/ML KwikPen Commonly known as: HumaLOG KwikPen Max daily 60 units Started by: Dorita Sciara, MD   Lantus SoloStar 100 UNIT/ML Solostar Pen Generic drug: insulin glargine Inject 60 Units into the skin at bedtime. What changed: how much to take Changed by: Dorita Sciara, MD   onetouch ultrasoft lancets Use 4 times daily as directed  E11.42, Z79.4   OneTouch Verio test strip Generic drug: glucose blood Use as instructed   OneTouch Verio w/Device Kit Inject 1 Units into the skin QID. Use meter 4 times daily as directed   sildenafil 100 MG tablet Commonly known as: VIAGRA Take 1  tablet (100 mg total) by mouth daily as needed for erectile dysfunction.   tadalafil 5 MG tablet Commonly known as: CIALIS Take 1 tablet by once daily   Victoza 18 MG/3ML Sopn Generic drug: liraglutide Inject 1.8 mg into the skin daily in the afternoon.          OBJECTIVE:   PHYSICAL EXAM: VS: BP 124/82 (BP Location: Left Arm, Patient Position: Sitting, Cuff Size: Large)    Pulse 92    Ht _0  (2.032 m)    Wt (!) 304 lb (137.9 kg)    SpO2 99%  BMI 33.40 kg/m     EXAM: General: Pt appears well and is in NAD  Neck: General: Supple without adenopathy. Thyroid: Thyroid size normal.  No goiter or nodules appreciated. No thyroid bruit.  Lungs: Clear with good BS bilat with no rales, rhonchi, or wheezes  Heart: Auscultation: RRR.  Abdomen: Normoactive bowel sounds, soft, nontender, without masses or organomegaly palpable  Extremities:  BL LE: No pretibial edema normal ROM and strength.  Mental Status: Judgment, insight: Intact Orientation: Oriented to time, place, and person Mood and affect: No depression, anxiety, or agitation     DATA REVIEWED: Results for IZEYAH, DEIKE (MRN [id number]) as of 10/23/2020 09:01  Ref. Range 10/21/2020 10:39  Sodium Latest Ref Range: 135 - 145 mEq/L 141  Potassium Latest Ref Range: 3.5 - 5.1 mEq/L 4.3  Chloride Latest Ref Range: 96 - 112 mEq/L 106  CO2 Latest Ref Range: 19 - 32 mEq/L 27  Glucose Latest Ref Range: 70 - 99 mg/dL 83  BUN Latest Ref Range: 6 - 23 mg/dL 14  Creatinine Latest Ref Range: 0.40 - 1.50 mg/dL 1.04  Calcium Latest Ref Range: 8.4 - 10.5 mg/dL 9.9  Alkaline Phosphatase Latest Ref Range: 39 - 117 U/L 59  Albumin Latest Ref Range: 3.5 - 5.2 g/dL 4.0  AST Latest Ref Range: 0 - 37 U/L 27  ALT Latest Ref Range: 0 - 53 U/L 30  Total Protein Latest Ref Range: 6.0 - 8.3 g/dL 7.4  Total Bilirubin Latest Ref Range: 0.2 - 1.2 mg/dL 0.3  GFR Latest Ref Range: >60.00 mL/min 90.93  WBC Latest Ref Range: 4.0 - 10.5 K/uL 5.2   RBC Latest Ref Range: 4.22 - 5.81 Mil/uL 5.52  Hemoglobin Latest Ref Range: 13.0 - 17.0 g/dL 13.7  HCT Latest Ref Range: 39.0 - 52.0 % 42.9  MCV Latest Ref Range: 78.0 - 100.0 fl 77.6 (L)  MCHC Latest Ref Range: 30.0 - 36.0 g/dL 31.9  RDW Latest Ref Range: 11.5 - 15.5 % 14.6  Platelets Latest Ref Range: 150.0 - 400.0 K/uL 141.0 (L)  Testosterone Latest Ref Range: 300.00 - 890.00 ng/dL 270.13 (L)    MRI Brain 08/22/2019   Brain: Dynamic pituitary protocol. The sella is shallow. The pituitary is normal in height 5.4 mm. The pituitary enhances homogeneously. No microadenoma or macro adenoma. Infundibulum midline. Normal cavernous sinus and optic chiasm.   Ventricle size normal. Cerebral volume normal. Negative for acute infarct hemorrhage or mass. Few small white matter hyperintensities bilaterally appear chronic.   Vascular: Normal arterial flow voids   Skull and upper cervical spine: Normal bone marrow.   Sinuses/Orbits: Mild mucosal edema paranasal sinuses.  Normal orbit   Other: None   IMPRESSION: Negative for pituitary lesion. The sella is shallow however the pituitary is normal in size and enhances homogeneously.   No acute intracranial abnormality. Few small white matter hyperintensities bilaterally may reflect chronic microvascular ischemia. Correlate with risk factors for small vessel disease.  ASSESSMENT / PLAN / RECOMMENDATIONS:   1. T2DM, poorly controlled without complications: A6T 0.1%   -Patient has been noted with worsening glycemic control -We discussed the importance of eating proper meals and avoiding snacks through the day. -He continues to take higher dose of Humalog mix with breakfast. -Today we reviewed his CGM download and we saw the effect of snacking on his glucose control -I have attempted to prescribe Farxiga in 09/2020, the patient tells me this is not covered by his insurance.  We did not get a prior authorization  request for it but we did get  1 for Victoza -I am going to we prescribed Farxiga -I am also going to stop Humalog mix, start him on plain Humalog and provide him with a correction scale  Medication -Increase Lantus 60 units daily  -Stop HUmalog Mix  -Start Humalog 15 units with each meal -Continue Victoza 1.8 mg daily  - Start Farxiga 5 mg, 1 tablet every morning  -Correction factor : Humalog (BG -130/20)   2. Hypogonadotrophic Hypogonadism :  - Unknown cause, as he has no hx of head injury, or chronic narcotic or marijuana use.  - Pituitary MRI was unrevealing (07/2019) - Due to his intention to have children in the future, we have opted to treat with clomiphene. He understands this is an off label use.  -This has been taken over by urology, patient saw them last week and was advised to take half a tablet daily, he is not clear as why they reduce the dose  Medications  Clomiphene per urology   F/U in 4 months    Signed electronically by: Mack Guise, MD  [facility]  [facility] [place], [place] [address] Phone: [phone] FAX: [phone]      CC: Vevelyn Francois, NP [address] [place] [address] Phone: [phone]  Fax: [phone]   Return to Endocrinology clinic as below: Future Appointments  Date Time Provider New Site  04/27/2021  9:15 AM Suzan Slick, NP [facility] None  07/07/2021  9:40 AM Vevelyn Francois, NP [facility] None

## 2021-04-26 NOTE — Patient Instructions (Addendum)
-   Increase  Lantus 60 units daily  - Stop  HUmalog Mix  - Start Humalog 15 units with each meal  - Continue Victoza 1.8 mg daily  - Start Farxiga 5 mg, 1 tablet every morning  -Humalog correctional insulin: ADD extra units on insulin to your meal-time Humalog dose if your blood sugars are higher than 150. Use the scale below to help guide you:   Blood sugar before meal Number of units to inject  Less than 150 0 unit  151 -  170 1 units  171 -  190 2 units  191 -  210 3 units  211 -  230 4 units  231 -  250 5 units  251 -  270 6 units  271 -  290 7 units  291 -  310 8 units  311 - 330 9 units        HOW TO TREAT LOW BLOOD SUGARS (Blood sugar LESS THAN 70 MG/DL) Please follow the RULE OF 15 for the treatment of hypoglycemia treatment (when your (blood sugars are less than 70 mg/dL)   STEP 1: Take 15 grams of carbohydrates when your blood sugar is low, which includes:  3-4 GLUCOSE TABS  OR 3-4 OZ OF JUICE OR REGULAR SODA OR ONE TUBE OF GLUCOSE GEL    STEP 2: RECHECK blood sugar in 15 MINUTES STEP 3: If your blood sugar is still low at the 15 minute recheck --> then, go back to STEP 1 and treat AGAIN with another 15 grams of carbohydrates.

## 2021-04-27 ENCOUNTER — Encounter: Payer: Self-pay | Admitting: Family

## 2021-04-27 ENCOUNTER — Ambulatory Visit (INDEPENDENT_AMBULATORY_CARE_PROVIDER_SITE_OTHER): Payer: 59 | Admitting: Family

## 2021-04-27 DIAGNOSIS — L97521 Non-pressure chronic ulcer of other part of left foot limited to breakdown of skin: Secondary | ICD-10-CM

## 2021-04-27 DIAGNOSIS — S98112A Complete traumatic amputation of left great toe, initial encounter: Secondary | ICD-10-CM

## 2021-04-27 DIAGNOSIS — Z89412 Acquired absence of left great toe: Secondary | ICD-10-CM

## 2021-04-27 NOTE — Progress Notes (Signed)
Office Visit Note   Patient: Jonathan [name]           Date of Birth: September 27, 1980           MRN: [id number] Visit Date: 04/27/2021              Requested by: Vevelyn Francois, NP [address] [place],  [place] [address] PCP: Vevelyn Francois, NP  Chief Complaint  Patient presents with   Left Foot - Wound Check, Follow-up      HPI: The patient is a 41 year old gentleman seen today in follow-up for new ulcers to his left foot.  ulcer beneath the second metatarsal head as well as along the medial column.  He is status post first ray amputation  Has been using a felt pressure relieving doughnut as well as Silvadene dressing changes he is concerned his foot has been too wet from the Silvadene.  Denies fevers chills no foul drainage  Assessment & Plan: Visit Diagnoses: No diagnosis found.  Plan: Continue pressure relief in his shoe wear.  Minimize weightbearing.  Given a sheet of silver cell to use for his daily dressing changes after Dial soap cleansing. Follow-Up Instructions: Return in about 3 weeks (around 05/18/2021).   Ortho Exam  Patient is alert, oriented, no adenopathy, well-dressed, normal affect, normal respiratory effort. On examination of the left foot today there is no edema.  Ulcer reduced in size is now 1.5 cm in diameter ulcer beneath the second metatarsal head this is filled in with granulation tissue.  There is no depth no probing no active drainage the ulcer over the midfoot is fully healed. there is no surrounding erythema no warmth has a strong dorsalis pedis pulse  Imaging: No results found. No images are attached to the encounter.  Labs: Lab Results  Component Value Date   HGBA1C 8.8 (A) 04/26/2021   HGBA1C 7.9 (A) 10/23/2020   HGBA1C 9.8 (A) 01/07/2020   HGBA1C 9.8 01/07/2020   HGBA1C 9.8 (A) 01/07/2020   HGBA1C 9.8 (A) 01/07/2020   ESRSEDRATE 5 01/30/2018   ESRSEDRATE 23 (H) 12/26/2016   ESRSEDRATE 1 12/05/2016   CRP <0.8 01/30/2018   CRP 5.8  (H) 12/26/2016   CRP <0.8 12/05/2016   REPTSTATUS 02/04/2018 FINAL 01/30/2018   GRAMSTAIN  12/26/2016    FEW WBC PRESENT, PREDOMINANTLY PMN ABUNDANT GRAM POSITIVE COCCI IN PAIRS ABUNDANT GRAM NEGATIVE RODS Performed at [facility], [place] [address] [place], [place] [address]    CULT  01/30/2018    NO GROWTH 5 DAYS Performed at [facility] [address], [place], [place] [address]      Lab Results  Component Value Date   ALBUMIN 4.0 10/21/2020   ALBUMIN 4.2 05/26/2020   ALBUMIN 4.1 02/05/2020    No results found for: MG Lab Results  Component Value Date   VD25OH 18.9 (L) 05/26/2020   VD25OH 8.8 (L) 04/29/2019    No results found for: PREALBUMIN CBC EXTENDED Latest Ref Rng & Units 10/21/2020 05/26/2020 02/05/2020  WBC 4.0 - 10.5 K/uL 5.2 5.4 5.2  RBC 4.22 - 5.81 Mil/uL 5.52 6.07(H) 5.76  HGB 13.0 - 17.0 g/dL 13.7 15.1 14.2  HCT 39.0 - 52.0 % 42.9 47.8 44.7  PLT 150.0 - 400.0 K/uL 141.0(L) 166 135.0(L)  NEUTROABS 1.4 - 7.0 x10E3/uL - 2.7 -  LYMPHSABS 0.7 - 3.1 x10E3/uL - 2.3 -     There is no height or weight on file to calculate BMI.  Orders:  No orders of the defined types were placed in this encounter.  No orders of the defined types were placed in this encounter.    Procedures: No procedures performed  Clinical Data: No additional findings.  ROS:  All other systems negative, except as noted in the HPI. Review of Systems  Objective: Vital Signs: There were no vitals taken for this visit.  Specialty Comments:  No specialty comments available.  PMFS History: Patient Active Problem List   Diagnosis Date Noted   Hypogonadotropic hypogonadism ([place]) 07/17/2019   Hemoglobin A1C between 7% and 9% indicating borderline diabetic control ([place]) 04/29/2019   Decreased libido 04/29/2019   Low testosterone in male 04/2019   Non-pressure chronic ulcer of other part of left foot with necrosis of bone ([place])    Cutaneous abscess of left foot     Hemoglobin A1C greater than 9%, indicating poor diabetic control 08/27/2018   Amputation of left great toe ([place]) 08/27/2018   Osteomyelitis of great toe of left foot ([place]) 01/30/2018   Osteomyelitis ([place]) 01/30/2018   Depression with anxiety 01/30/2018   Major depressive disorder, recurrent severe without psychotic features ([place]) 07/16/2017   OD (overdose of drug), intentional self-harm, initial encounter ([place]) 07/15/2017   Cellulitis 12/26/2016   Open toe wound, subsequent encounter 12/12/2016   Hyperglycemia 12/06/2016   Diabetic ulcer of left foot ([place]) 12/05/2016   Diabetic neuropathy ([place]) 10/16/2015   Erectile dysfunction 08/05/2015   Low testosterone 09/12/2014   DM type 2, uncontrolled, with neuropathy 06/12/2014   Diabetes mellitus, type II ([place]) 03/23/2012   Noncompliance 03/23/2012   Gastroenteritis and colitis, viral 03/23/2012   Past Medical History:  Diagnosis Date   Amputation of left great toe ([place]) 01/2019   Anxiety    Decreased libido 04/2019   Diabetes mellitus    Erectile dysfunction 04/2019   Hyperglycemia 09/2019   Low testosterone in male 04/2019   Proteinuria 01/07/2020   Vitamin D deficiency 04/2019    Family History  Problem Relation Age of Onset   Other Mother        Healthy   Other Father        Healthy    Past Surgical History:  Procedure Laterality Date   AMPUTATION Left 02/02/2018   Procedure: LEFT GREAT TOE AMPUTATION;  Surgeon: Newt Minion, MD;  Location: [facility];  Service: Orthopedics;  Laterality: Left;   AMPUTATION Left 02/08/2019   Procedure: LEFT FOOT 1ST RAY AMPUTATION;  Surgeon: Newt Minion, MD;  Location: [facility];  Service: Orthopedics;  Laterality: Left;   Social History   Occupational History   Not on file  Tobacco Use   Smoking status: Never   Smokeless tobacco: Never  Vaping Use   Vaping Use: Never used  Substance and Sexual Activity   Alcohol use: Yes    Alcohol/week: 2.0 standard drinks    Types: 1 Glasses of  wine, 1 Cans of beer per week    Comment: occassional   Drug use: No   Sexual activity: Yes

## 2021-05-09 ENCOUNTER — Other Ambulatory Visit: Payer: Self-pay | Admitting: Internal Medicine

## 2021-05-10 ENCOUNTER — Other Ambulatory Visit (HOSPITAL_COMMUNITY): Payer: Self-pay

## 2021-05-10 MED ORDER — CLOMIPHENE CITRATE 50 MG PO TABS
50.0000 mg | ORAL_TABLET | Freq: Every day | ORAL | 0 refills | Status: DC
  Filled 2021-05-10 – 2021-05-18 (×4): qty 30, 30d supply, fill #0

## 2021-05-17 ENCOUNTER — Other Ambulatory Visit (HOSPITAL_COMMUNITY): Payer: Self-pay

## 2021-05-17 ENCOUNTER — Other Ambulatory Visit: Payer: Self-pay

## 2021-05-18 ENCOUNTER — Encounter: Payer: Self-pay | Admitting: Family

## 2021-05-18 ENCOUNTER — Ambulatory Visit (INDEPENDENT_AMBULATORY_CARE_PROVIDER_SITE_OTHER): Payer: 59 | Admitting: Family

## 2021-05-18 ENCOUNTER — Other Ambulatory Visit: Payer: Self-pay

## 2021-05-18 DIAGNOSIS — L97521 Non-pressure chronic ulcer of other part of left foot limited to breakdown of skin: Secondary | ICD-10-CM

## 2021-05-18 DIAGNOSIS — Z89412 Acquired absence of left great toe: Secondary | ICD-10-CM

## 2021-05-18 DIAGNOSIS — S98112A Complete traumatic amputation of left great toe, initial encounter: Secondary | ICD-10-CM

## 2021-05-18 NOTE — Progress Notes (Signed)
? ?Office Visit Note ?  ?Patient: Jonathan [name]           ?Date of Birth: May 17, 1980           ?MRN: [id number] ?Visit Date: 05/18/2021 ?             ?Requested by: Barbette Merino, NP ?[address] ?[place],  [place] [address] ?PCP: Barbette Merino, NP ? ?Chief Complaint  ?Patient presents with  ? Left Foot - Wound Check, Follow-up  ? ? ? ? ?HPI: ?The patient is a 41 year old gentleman seen today in follow-up for ulcers to his left foot.  ulcer beneath the second metatarsal head as well as along the medial column.  He is status post first ray amputation ? ?Has been using a felt pressure relieving doughnut as well as Silvadene dressing changes. Pleased with progress. ? ?Denies fevers chills no foul drainage ? ?Assessment & Plan: ?Visit Diagnoses:  ?1. Foot ulcer, left, limited to breakdown of skin (HCC)   ?2. Amputation of left great toe (HCC)   ? ? ?Plan: Continue pressure relief in his shoe wear.  Minimize weightbearing.  Continue daily wound care. Dry dressings. ? ?Follow-Up Instructions: No follow-ups on file.  ? ?Ortho Exam ? ?Patient is alert, oriented, no adenopathy, well-dressed, normal affect, normal respiratory effort. ?On examination of the left foot today there is no edema.  Ulcer reduced in size is now 0.3 cm in diameter ulcer beneath the second metatarsal head this is filled in with granulation tissue.  There is no depth no probing no active drainage the ulcer over the midfoot is fully healed. Callus build up to both areas was debrided with a 10 blade knife. there is no surrounding erythema no warmth has a strong dorsalis pedis pulse ? ?Imaging: ?No results found. ?No images are attached to the encounter. ? ?Labs: ?Lab Results  ?Component Value Date  ? HGBA1C 8.8 (A) 04/26/2021  ? HGBA1C 7.9 (A) 10/23/2020  ? HGBA1C 9.8 (A) 01/07/2020  ? HGBA1C 9.8 01/07/2020  ? HGBA1C 9.8 (A) 01/07/2020  ? HGBA1C 9.8 (A) 01/07/2020  ? ESRSEDRATE 5 01/30/2018  ? ESRSEDRATE 23 (H) 12/26/2016  ? ESRSEDRATE 1  12/05/2016  ? CRP <0.8 01/30/2018  ? CRP 5.8 (H) 12/26/2016  ? CRP <0.8 12/05/2016  ? REPTSTATUS 02/04/2018 FINAL 01/30/2018  ? GRAMSTAIN  12/26/2016  ?  FEW WBC PRESENT, PREDOMINANTLY PMN ?ABUNDANT GRAM POSITIVE COCCI IN PAIRS ?ABUNDANT GRAM NEGATIVE RODS ?Performed at [facility], [address] [place], [place] [address] ?  ? CULT  01/30/2018  ?  NO GROWTH 5 DAYS ?Performed at [facility], [address], [place], [place] [address] ?  ? ? ? ?Lab Results  ?Component Value Date  ? ALBUMIN 4.0 10/21/2020  ? ALBUMIN 4.2 05/26/2020  ? ALBUMIN 4.1 02/05/2020  ? ? ?No results found for: MG ?Lab Results  ?Component Value Date  ? VD25OH 18.9 (L) 05/26/2020  ? VD25OH 8.8 (L) 04/29/2019  ? ? ?No results found for: PREALBUMIN ?CBC EXTENDED Latest Ref Rng & Units 10/21/2020 05/26/2020 02/05/2020  ?WBC 4.0 - 10.5 K/uL 5.2 5.4 5.2  ?RBC 4.22 - 5.81 Mil/uL 5.52 6.07(H) 5.76  ?HGB 13.0 - 17.0 g/dL 75.1 02.5 85.2  ?HCT 39.0 - 52.0 % 42.9 47.8 44.7  ?PLT 150.0 - 400.0 K/uL 141.0(L) 166 135.0(L)  ?NEUTROABS 1.4 - 7.0 x10E3/uL - 2.7 -  ?LYMPHSABS 0.7 - 3.1 x10E3/uL - 2.3 -  ? ? ? ?There  is no height or weight on file to calculate BMI. ? ?Orders:  ?No orders of the defined types were placed in this encounter. ? ?No orders of the defined types were placed in this encounter. ? ? ? Procedures: ?No procedures performed ? ?Clinical Data: ?No additional findings. ? ?ROS: ? ?All other systems negative, except as noted in the HPI. ?Review of Systems ? ?Objective: ?Vital Signs: There were no vitals taken for this visit. ? ?Specialty Comments:  ?No specialty comments available. ? ?PMFS History: ?Patient Active Problem List  ? Diagnosis Date Noted  ? Hypogonadotropic hypogonadism (HCC) 07/17/2019  ? Hemoglobin A1C between 7% and 9% indicating borderline diabetic control (HCC) 04/29/2019  ? Decreased libido 04/29/2019  ? Low testosterone in male 04/2019  ? Non-pressure chronic ulcer of other part of left foot with necrosis of bone  (HCC)   ? Cutaneous abscess of left foot   ? Hemoglobin A1C greater than 9%, indicating poor diabetic control 08/27/2018  ? Amputation of left great toe (HCC) 08/27/2018  ? Osteomyelitis of great toe of left foot (HCC) 01/30/2018  ? Osteomyelitis (HCC) 01/30/2018  ? Depression with anxiety 01/30/2018  ? Major depressive disorder, recurrent severe without psychotic features (HCC) 07/16/2017  ? OD (overdose of drug), intentional self-harm, initial encounter (HCC) 07/15/2017  ? Cellulitis 12/26/2016  ? Open toe wound, subsequent encounter 12/12/2016  ? Hyperglycemia 12/06/2016  ? Diabetic ulcer of left foot (HCC) 12/05/2016  ? Diabetic neuropathy (HCC) 10/16/2015  ? Erectile dysfunction 08/05/2015  ? Low testosterone 09/12/2014  ? DM type 2, uncontrolled, with neuropathy 06/12/2014  ? Diabetes mellitus, type II (HCC) 03/23/2012  ? Noncompliance 03/23/2012  ? Gastroenteritis and colitis, viral 03/23/2012  ? ?Past Medical History:  ?Diagnosis Date  ? Amputation of left great toe (HCC) 01/2019  ? Anxiety   ? Decreased libido 04/2019  ? Diabetes mellitus   ? Erectile dysfunction 04/2019  ? Hyperglycemia 09/2019  ? Low testosterone in male 04/2019  ? Proteinuria 01/07/2020  ? Vitamin D deficiency 04/2019  ?  ?Family History  ?Problem Relation Age of Onset  ? Other Mother   ?     Healthy  ? Other Father   ?     Healthy  ?  ?Past Surgical History:  ?Procedure Laterality Date  ? AMPUTATION Left 02/02/2018  ? Procedure: LEFT GREAT TOE AMPUTATION;  Surgeon: Nadara Mustard, MD;  Location[facility];  Service: Orthopedics;  Laterality: Left;  ? AMPUTATION Left 02/08/2019  ? Procedure: LEFT FOOT 1ST RAY AMPUTATION;  Surgeon: Nadara Mustard, MD;  Location[facility];  Service: Orthopedics;  Laterality: Left;  ? ?Social History  ? ?Occupational History  ? Not on file  ?Tobacco Use  ? Smoking status: Never  ? Smokeless tobacco: Never  ?Vaping Use  ? Vaping Use: Never used  ?Substance and Sexual Activity  ? Alcohol use: Yes  ?  Alcohol/week:  2.0 standard drinks  ?  Types: 1 Glasses of wine, 1 Cans of beer per week  ?  Comment: occassional  ? Drug use: No  ? Sexual activity: Yes  ? ? ? ? ? ?

## 2021-05-19 ENCOUNTER — Other Ambulatory Visit: Payer: Self-pay

## 2021-05-25 ENCOUNTER — Other Ambulatory Visit: Payer: Self-pay | Admitting: Nurse Practitioner

## 2021-05-27 ENCOUNTER — Other Ambulatory Visit: Payer: Self-pay

## 2021-06-08 ENCOUNTER — Other Ambulatory Visit: Payer: Self-pay

## 2021-06-09 ENCOUNTER — Telehealth: Payer: Self-pay

## 2021-06-09 ENCOUNTER — Other Ambulatory Visit: Payer: Self-pay

## 2021-06-09 ENCOUNTER — Other Ambulatory Visit (HOSPITAL_COMMUNITY): Payer: Self-pay

## 2021-06-09 NOTE — Telephone Encounter (Signed)
Patient Advocate Encounter ?  ?Received notification from [facility] that prior authorization for Farxiga 5mg  tabs is required by his/her insurance [employer]. ?  ?PA submitted on 06/09/21 ? ?Key#: [id number] ? ?Status is pending ?   ? Clinic will continue to follow: ? ?Patient Advocate ?Fax: [phone]  ?

## 2021-06-11 ENCOUNTER — Other Ambulatory Visit: Payer: Self-pay

## 2021-06-14 ENCOUNTER — Other Ambulatory Visit: Payer: Self-pay | Admitting: Internal Medicine

## 2021-06-14 ENCOUNTER — Other Ambulatory Visit: Payer: Self-pay

## 2021-06-14 DIAGNOSIS — R7309 Other abnormal glucose: Secondary | ICD-10-CM

## 2021-06-14 DIAGNOSIS — R739 Hyperglycemia, unspecified: Secondary | ICD-10-CM

## 2021-06-14 DIAGNOSIS — Z794 Long term (current) use of insulin: Secondary | ICD-10-CM

## 2021-06-14 MED ORDER — OZEMPIC (2 MG/DOSE) 8 MG/3ML ~~LOC~~ SOPN
2.0000 mg | PEN_INJECTOR | SUBCUTANEOUS | 3 refills | Status: DC
  Filled 2021-06-14 – 2021-07-04 (×2): qty 3, 28d supply, fill #0
  Filled 2021-08-09: qty 3, 28d supply, fill #1
  Filled 2021-09-05 – 2021-09-30 (×2): qty 3, 28d supply, fill #2
  Filled 2021-10-26: qty 3, 28d supply, fill #3
  Filled 2021-11-22: qty 3, 28d supply, fill #4
  Filled 2021-11-25 – 2022-01-14 (×3): qty 3, 28d supply, fill #5
  Filled 2022-02-05 – 2022-03-11 (×3): qty 3, 28d supply, fill #6
  Filled 2022-04-03: qty 3, 28d supply, fill #7
  Filled 2022-05-02: qty 3, 28d supply, fill #8
  Filled 2022-05-31 – 2022-06-14 (×2): qty 3, 28d supply, fill #9

## 2021-06-15 ENCOUNTER — Encounter: Payer: Self-pay | Admitting: Family

## 2021-06-15 ENCOUNTER — Other Ambulatory Visit (HOSPITAL_COMMUNITY): Payer: Self-pay

## 2021-06-15 ENCOUNTER — Ambulatory Visit (INDEPENDENT_AMBULATORY_CARE_PROVIDER_SITE_OTHER): Payer: 59 | Admitting: Family

## 2021-06-15 ENCOUNTER — Other Ambulatory Visit: Payer: Self-pay

## 2021-06-15 DIAGNOSIS — S98112A Complete traumatic amputation of left great toe, initial encounter: Secondary | ICD-10-CM

## 2021-06-15 DIAGNOSIS — B351 Tinea unguium: Secondary | ICD-10-CM

## 2021-06-15 DIAGNOSIS — L97521 Non-pressure chronic ulcer of other part of left foot limited to breakdown of skin: Secondary | ICD-10-CM

## 2021-06-15 DIAGNOSIS — Z89412 Acquired absence of left great toe: Secondary | ICD-10-CM

## 2021-06-15 MED ORDER — EMPAGLIFLOZIN 25 MG PO TABS
25.0000 mg | ORAL_TABLET | Freq: Every day | ORAL | 3 refills | Status: DC
  Filled 2021-06-15: qty 30, 30d supply, fill #0
  Filled 2021-07-04: qty 30, 30d supply, fill #1
  Filled 2021-08-09: qty 30, 30d supply, fill #2
  Filled 2021-09-08 – 2021-09-30 (×2): qty 30, 30d supply, fill #3
  Filled 2021-10-26 – 2021-11-22 (×2): qty 30, 30d supply, fill #4
  Filled 2021-11-25 – 2022-01-14 (×3): qty 30, 30d supply, fill #5
  Filled 2022-02-09: qty 30, 30d supply, fill #6
  Filled 2022-03-09: qty 30, 30d supply, fill #7
  Filled 2022-04-03: qty 30, 30d supply, fill #8
  Filled 2022-05-06: qty 30, 30d supply, fill #9

## 2021-06-15 NOTE — Addendum Note (Signed)
Addended by: Scarlette Shorts on: 06/15/2021 03:36 PM ? ? Modules accepted: Orders ? ?

## 2021-06-15 NOTE — Progress Notes (Signed)
? ?Office Visit Note ?  ?Patient: Jonathan [name]           ?Date of Birth: Feb 02, 1981           ?MRN: [id number] ?Visit Date: 06/15/2021 ?             ?Requested by: Barbette Merino, NP ?[address] ?[place],  [place] [address] ?PCP: Barbette Merino, NP ? ?Chief Complaint  ?Patient presents with  ? Left Foot - Wound Check  ? ? ? ? ?HPI: ?The patient is a 41 year old gentleman seen today in follow-up for ulcers to his left foot.  ulcer beneath the second metatarsal head. He is status post first ray amputation ? ?Has been using a felt pressure relieving doughnut.  ? ?Denies fevers chills no foul drainage ? ?Complaining of fungal nails.  Wondering where he can get these trimmed. ? ?Assessment & Plan: ?Visit Diagnoses:  ?No diagnosis found. ? ? ?Plan: Continue pressure relief in his shoe wear.  Ulcer is well-healed.  May advance activities as tolerated he will follow-up in the office in 2 months for a foot check ?Follow-Up Instructions: No follow-ups [name] file.  ? ?Ortho Exam ? ?Patient is alert, oriented, no adenopathy, well-dressed, normal affect, normal respiratory effort. ?[name] examination of the left foot today there is no edema.  Ulcer is well-healed there is callused ulceration this is 2 cm in diameter this was debrided with a 10 blade knife back to viable tissue there is no open area no drainage.  Does have thickened and discolored onychomycotic nails x8 these were trimmed today as patient is unable to safely trim his own nails. ? ?Imaging: ?No results found. ?No images are attached to the encounter. ? ?Labs: ?Lab Results  ?Component Value Date  ? HGBA1C 8.8 (A) 04/26/2021  ? HGBA1C 7.9 (A) 10/23/2020  ? HGBA1C 9.8 (A) 01/07/2020  ? HGBA1C 9.8 01/07/2020  ? HGBA1C 9.8 (A) 01/07/2020  ? HGBA1C 9.8 (A) 01/07/2020  ? ESRSEDRATE 5 01/30/2018  ? ESRSEDRATE 23 (H) 12/26/2016  ? ESRSEDRATE 1 12/05/2016  ? CRP <0.8 01/30/2018  ? CRP 5.8 (H) 12/26/2016  ? CRP <0.8 12/05/2016  ? REPTSTATUS 02/04/2018 FINAL 01/30/2018  ?  GRAMSTAIN  12/26/2016  ?  FEW WBC PRESENT, PREDOMINANTLY PMN ?ABUNDANT GRAM POSITIVE COCCI IN PAIRS ?ABUNDANT GRAM NEGATIVE RODS ?Performed at [facility], [address] [place], [place] [address] ?  ? CULT  01/30/2018  ?  NO GROWTH 5 DAYS ?Performed at [facility], [address] [place], [place] [address] ?  ? ? ? ?Lab Results  ?Component Value Date  ? ALBUMIN 4.0 10/21/2020  ? ALBUMIN 4.2 05/26/2020  ? ALBUMIN 4.1 02/05/2020  ? ? ?No results found for: MG ?Lab Results  ?Component Value Date  ? VD25OH 18.9 (L) 05/26/2020  ? VD25OH 8.8 (L) 04/29/2019  ? ? ?No results found for: PREALBUMIN ? ?  Latest Ref Rng & Units 10/21/2020  ? 10:39 AM 05/26/2020  ?  2:19 PM 02/05/2020  ?  7:56 AM  ?CBC EXTENDED  ?WBC 4.0 - 10.5 K/uL 5.2   5.4   5.2    ?RBC 4.22 - 5.81 Mil/uL 5.52   6.07   5.76    ?Hemoglobin 13.0 - 17.0 g/dL 06.3   01.6   01.0    ?HCT 39.0 - 52.0 % 42.9   47.8   44.7    ?Platelets 150.0 - 400.0 K/uL 141.0   166  135.0    ?NEUT# 1.4 - 7.0 x10E3/uL  2.7     ?Lymph# 0.7 - 3.1 x10E3/uL  2.3     ? ? ? ?There is no height or weight [name] file to calculate BMI. ? ?Orders:  ?No orders of the defined types were placed in this encounter. ? ?No orders of the defined types were placed in this encounter. ? ? ? Procedures: ?No procedures performed ? ?Clinical Data: ?No additional findings. ? ?ROS: ? ?All other systems negative, except as noted in the HPI. ?Review of Systems ? ?Objective: ?Vital Signs: There were no vitals taken for this visit. ? ?Specialty Comments:  ?No specialty comments available. ? ?PMFS History: ?Patient Active Problem List  ? Diagnosis Date Noted  ? Hypogonadotropic hypogonadism (HCC) 07/17/2019  ? Hemoglobin A1C between 7% and 9% indicating borderline diabetic control (HCC) 04/29/2019  ? Decreased libido 04/29/2019  ? Low testosterone in male 04/2019  ? Non-pressure chronic ulcer of other part of left foot with necrosis of bone (HCC)   ? Cutaneous abscess of left foot   ? Hemoglobin A1C  greater than 9%, indicating poor diabetic control 08/27/2018  ? Amputation of left great toe (HCC) 08/27/2018  ? Osteomyelitis of great toe of left foot (HCC) 01/30/2018  ? Osteomyelitis (HCC) 01/30/2018  ? Depression with anxiety 01/30/2018  ? Major depressive disorder, recurrent severe without psychotic features (HCC) 07/16/2017  ? OD (overdose of drug), intentional self-harm, initial encounter (HCC) 07/15/2017  ? Cellulitis 12/26/2016  ? Open toe wound, subsequent encounter 12/12/2016  ? Hyperglycemia 12/06/2016  ? Diabetic ulcer of left foot (HCC) 12/05/2016  ? Diabetic neuropathy (HCC) 10/16/2015  ? Erectile dysfunction 08/05/2015  ? Low testosterone 09/12/2014  ? DM type 2, uncontrolled, with neuropathy 06/12/2014  ? Diabetes mellitus, type II (HCC) 03/23/2012  ? Noncompliance 03/23/2012  ? Gastroenteritis and colitis, viral 03/23/2012  ? ?Past Medical History:  ?Diagnosis Date  ? Amputation of left great toe (HCC) 01/2019  ? Anxiety   ? Decreased libido 04/2019  ? Diabetes mellitus   ? Erectile dysfunction 04/2019  ? Hyperglycemia 09/2019  ? Low testosterone in male 04/2019  ? Proteinuria 01/07/2020  ? Vitamin D deficiency 04/2019  ?  ?Family History  ?Problem Relation Age of Onset  ? Other Mother   ?     Healthy  ? Other Father   ?     Healthy  ?  ?Past Surgical History:  ?Procedure Laterality Date  ? AMPUTATION Left 02/02/2018  ? Procedure: LEFT GREAT TOE AMPUTATION;  Surgeon: Nadara Mustard, MD;  Location: [facility] OR;  Service: Orthopedics;  Laterality: Left;  ? AMPUTATION Left 02/08/2019  ? Procedure: LEFT FOOT 1ST RAY AMPUTATION;  Surgeon: Nadara Mustard, MD;  Location[facility];  Service: Orthopedics;  Laterality: Left;  ? ?Social History  ? ?Occupational History  ? Not [name] file  ?Tobacco Use  ? Smoking status: Never  ? Smokeless tobacco: Never  ?Vaping Use  ? Vaping Use: Never used  ?Substance and Sexual Activity  ? Alcohol use: Yes  ?  Alcohol/week: 2.0 standard drinks  ?  Types: 1 Glasses of wine, 1 Cans of  beer per week  ?  Comment: occassional  ? Drug use: No  ? Sexual activity: Yes  ? ? ? ? ? ?

## 2021-06-15 NOTE — Telephone Encounter (Signed)
Patient Advocate Encounter ? ?Received notification from [employer] that the request for prior authorization for Farxiga 5mg  tabs has been denied due to the patient not trying [name].   ? ?I ran a test claim for [name] 10mg .  The copay is $40. No PA needed. ?  ? ?Specialty Pharmacy Patient Advocate ?Fax: [phone]  ?

## 2021-06-16 ENCOUNTER — Telehealth: Payer: Self-pay | Admitting: Pharmacy Technician

## 2021-06-16 ENCOUNTER — Other Ambulatory Visit: Payer: Self-pay

## 2021-06-16 ENCOUNTER — Other Ambulatory Visit (HOSPITAL_COMMUNITY): Payer: Self-pay

## 2021-06-16 NOTE — Telephone Encounter (Signed)
Patient Advocate Encounter ? ?Received notification from [place] that prior authorization for [place] 8MG /3ML is required. ?  ?PA submitted on 4.19.23 ?Key [id number] ?Status is pending ?  ?[facility] will continue to follow ? ?Jonathan [name], CPhT ?Patient Advocate ?[facility] ?Phone: [phone] ?Fax:  [phone] ? ?

## 2021-06-17 ENCOUNTER — Other Ambulatory Visit: Payer: Self-pay

## 2021-06-24 ENCOUNTER — Other Ambulatory Visit: Payer: Self-pay

## 2021-06-24 ENCOUNTER — Other Ambulatory Visit (HOSPITAL_COMMUNITY): Payer: Self-pay

## 2021-06-24 NOTE — Telephone Encounter (Signed)
Patient Advocate Encounter ? ?Prior Authorization for Ozempic 8mg /28ml pen injectors has been approved.   ? ?PA# 1m ? ?Effective dates: 06/16/21 through 06/17/22 ? ?Per Test Claim Patients co-pay is $25.  ? ?Spoke with Pharmacy to Process. ? ?Patient Advocate ?Fax: [phone]  ?

## 2021-07-04 ENCOUNTER — Other Ambulatory Visit: Payer: Self-pay | Admitting: Internal Medicine

## 2021-07-05 ENCOUNTER — Other Ambulatory Visit (HOSPITAL_COMMUNITY): Payer: Self-pay

## 2021-07-07 ENCOUNTER — Ambulatory Visit (INDEPENDENT_AMBULATORY_CARE_PROVIDER_SITE_OTHER): Payer: 59 | Admitting: Nurse Practitioner

## 2021-07-07 ENCOUNTER — Encounter: Payer: Self-pay | Admitting: Nurse Practitioner

## 2021-07-07 VITALS — BP 146/88 | HR 93 | Temp 97.8°F | Ht >= 80 in | Wt 310.6 lb

## 2021-07-07 DIAGNOSIS — Z794 Long term (current) use of insulin: Secondary | ICD-10-CM

## 2021-07-07 DIAGNOSIS — E118 Type 2 diabetes mellitus with unspecified complications: Secondary | ICD-10-CM | POA: Diagnosis not present

## 2021-07-07 DIAGNOSIS — Z0000 Encounter for general adult medical examination without abnormal findings: Secondary | ICD-10-CM | POA: Diagnosis not present

## 2021-07-07 NOTE — Patient Instructions (Addendum)
1. Type 2 diabetes mellitus with complication, with long-term current use of insulin ([place]) ? ?- HgB A1c ?- Lipid Panel ?- CBC ?- Comprehensive metabolic panel ? ?2. Routine health maintenance ? ?- Lipid Panel ?- CBC ?- Comprehensive metabolic panel ? ?3. Elevated BP ? ?Will recheck in 1 month ? ?Low salt diet ? ? ? ?Follow up: ? ?Follow up in 1 month for BP recheck ? ?

## 2021-07-07 NOTE — Progress Notes (Signed)
_0  ID: Jonathan [name], male    DOB: Jan 25, 1981, 41 y.o.   MRN: [id number] ? ?Chief Complaint  ?Patient presents with  ? Follow-up  ?  Patient is here today for his 6 month follow up.  ? ? ?Referring provider: ?Vevelyn Francois, NP ? ? ?HPI ? ? ? ?Patient presents today for routine follow-up.  This is a former patient of [employer].  Patient does have diabetes and is followed by endocrinology for this.  Overall patient has been doing well since his last visit here.  He has no new issues or concerns today.  It was noted that his blood pressure was slightly elevated today.  We discussed diet and exercise and will have patient return in 4 weeks for a repeat blood pressure check. Denies f/c/s, n/v/d, hemoptysis, PND, chest pain or edema. ? ? ? ?No Known Allergies ? ?Immunization History  ?Administered Date(s) Administered  ? Influenza,inj,Quad PF,6+ Mos 01/08/2015, 02/16/2018, 04/29/2019, 01/07/2020, 01/07/2021  ? Moderna Sars-Covid-2 Vaccination 08/08/2019, 09/04/2020  ? Pneumococcal Conjugate-13 01/07/2020  ? Tdap 09/11/2014  ? ? ?Past Medical History:  ?Diagnosis Date  ? Amputation of left great toe ([place]) 01/2019  ? Anxiety   ? Decreased libido 04/2019  ? Diabetes mellitus   ? Erectile dysfunction 04/2019  ? Hyperglycemia 09/2019  ? Low testosterone in male 04/2019  ? Proteinuria 01/07/2020  ? Vitamin D deficiency 04/2019  ? ? ?Tobacco History: ?Social History  ? ?Tobacco Use  ?Smoking Status Never  ?Smokeless Tobacco Never  ? ?Counseling given: Not Answered ? ? ?Outpatient Encounter Medications as of 07/07/2021  ?Medication Sig  ? atorvastatin (LIPITOR) 10 MG tablet Take 1 tablet (10 mg total) by mouth daily.  ? blood glucose meter kit and supplies Dispense based on patient and insurance preference. Use up to four times daily as directed. (FOR ICD-10 E10.9, E11.9).  ? clomiPHENE (CLOMID) 50 MG tablet take 1/2 tab by mouth once daily  ? clomiPHENE (CLOMID) 50 MG tablet Take 1 tablet (50 mg total) by mouth daily.   ? empagliflozin ([name]) 25 MG TABS tablet Take 1 tablet (25 mg total) by mouth daily before breakfast.  ? gabapentin (NEURONTIN) 300 MG capsule Take 1 capsule (300 mg total) by mouth at bedtime.  ? glucose blood (ONETOUCH VERIO) test strip USE AS DIRECTED  ? ibuprofen (ADVIL) 800 MG tablet Take 1 tablet (800 mg total) by mouth every 8 (eight) hours as needed.  ? insulin glargine (LANTUS SOLOSTAR) 100 UNIT/ML Solostar Pen Inject 60 Units into the skin at bedtime.  ? insulin lispro (HUMALOG KWIKPEN) 100 UNIT/ML KwikPen Max daily 60 units  ? Lancets (ONETOUCH ULTRASOFT) lancets Use 4 times daily as directed  E11.42, Z79.4  ? Semaglutide, 2 MG/DOSE, (OZEMPIC, 2 MG/DOSE,) 8 MG/3ML SOPN Inject 2 mg into the skin once a week.  ? tadalafil (CIALIS) 5 MG tablet Take 1 tablet by once daily  ? Blood Glucose Monitoring Suppl (ONETOUCH VERIO) w/Device KIT Inject 1 Units into the skin QID. Use meter 4 times daily as directed (Patient not taking: Reported on 07/07/2021)  ? sildenafil (VIAGRA) 100 MG tablet Take 1 tablet (100 mg total) by mouth daily as needed for erectile dysfunction. (Patient not taking: Reported on 07/07/2021)  ? ?No facility-administered encounter medications on file as of 07/07/2021.  ? ? ? ?Review of Systems ? ?Review of Systems  ?Constitutional: Negative.   ?HENT: Negative.    ?Cardiovascular: Negative.   ?Gastrointestinal: Negative.   ?Allergic/Immunologic: Negative.   ?Neurological: Negative.   ?  Psychiatric/Behavioral: Negative.     ? ? ? ?Physical Exam ? ?BP (!) 146/88   Pulse 93   Temp 97.8 ?F (36.6 ?C)   Ht _0  (2.032 m)   Wt (!) 310 lb 9.6 oz (140.9 kg)   SpO2 99%   BMI 34.12 kg/m?  ? ?Wt Readings from Last 5 Encounters:  ?07/07/21 (!) 310 lb 9.6 oz (140.9 kg)  ?04/26/21 (!) 304 lb (137.9 kg)  ?01/07/21 (!) 305 lb 0.8 oz (138.4 kg)  ?05/26/20 (!) 302 lb (137 kg)  ?03/09/20 292 lb 6 oz (132.6 kg)  ? ? ? ?Physical Exam ?Vitals and nursing note reviewed.  ?Constitutional:   ?   General: He is  not in acute distress. ?   Appearance: He is well-developed.  ?Cardiovascular:  ?   Rate and Rhythm: Normal rate and regular rhythm.  ?Pulmonary:  ?   Effort: Pulmonary effort is normal.  ?   Breath sounds: Normal breath sounds.  ?Skin: ?   General: Skin is warm and dry.  ?Neurological:  ?   Mental Status: He is alert and oriented to person, place, and time.  ? ? ? ?Lab Results: ? ?CBC ?   ?Component Value Date/Time  ? WBC 5.2 10/21/2020 1039  ? RBC 5.52 10/21/2020 1039  ? HGB 13.7 10/21/2020 1039  ? HGB 15.1 05/26/2020 1419  ? HCT 42.9 10/21/2020 1039  ? HCT 47.8 05/26/2020 1419  ? PLT 141.0 (L) 10/21/2020 1039  ? PLT 166 05/26/2020 1419  ? MCV 77.6 (L) 10/21/2020 1039  ? MCV 79 05/26/2020 1419  ? MCH 24.9 (L) 05/26/2020 1419  ? MCH 24.8 (L) 03/10/2018 1251  ? MCHC 31.9 10/21/2020 1039  ? RDW 14.6 10/21/2020 1039  ? RDW 13.8 05/26/2020 1419  ? LYMPHSABS 2.3 05/26/2020 1419  ? MONOABS 0.4 11/29/2019 0747  ? EOSABS 0.1 05/26/2020 1419  ? BASOSABS 0.0 05/26/2020 1419  ? ? ?BMET ?   ?Component Value Date/Time  ? NA 141 10/21/2020 1039  ? NA 140 05/26/2020 1419  ? K 4.3 10/21/2020 1039  ? CL 106 10/21/2020 1039  ? CO2 27 10/21/2020 1039  ? GLUCOSE 83 10/21/2020 1039  ? BUN 14 10/21/2020 1039  ? BUN 12 05/26/2020 1419  ? CREATININE 1.04 10/21/2020 1039  ? CREATININE 0.95 04/12/2016 1603  ? CALCIUM 9.9 10/21/2020 1039  ? GFRNONAA 114 04/29/2019 1342  ? GFRNONAA >89 04/12/2016 1603  ? GFRAA 131 04/29/2019 1342  ? GFRAA >89 04/12/2016 1603  ? ? ?BNP ?No results found for: BNP ? ?ProBNP ?No results found for: PROBNP ? ?Imaging: ?No results found. ? ? ?Assessment & Plan:  ? ?Type 2 diabetes mellitus with complication, with long-term current use of insulin ([place]) ?- HgB A1c ?- Lipid Panel ?- CBC ?- Comprehensive metabolic panel ? ?2. Routine health maintenance ? ?- Lipid Panel ?- CBC ?- Comprehensive metabolic panel ? ?3. Elevated BP ? ?Will recheck in 1 month ? ?Low salt diet ? ? ? ?Follow up: ? ?Follow up in 1 month for BP  recheck ? ? ? ? ?Fenton Foy, NP ?07/07/2021 ? ?

## 2021-07-07 NOTE — Assessment & Plan Note (Signed)
-   HgB A1c ?- Lipid Panel ?- CBC ?- Comprehensive metabolic panel ? ?2. Routine health maintenance ? ?- Lipid Panel ?- CBC ?- Comprehensive metabolic panel ? ?3. Elevated BP ? ?Will recheck in 1 month ? ?Low salt diet ? ? ? ?Follow up: ? ?Follow up in 1 month for BP recheck ?

## 2021-07-08 ENCOUNTER — Other Ambulatory Visit (HOSPITAL_COMMUNITY): Payer: Self-pay

## 2021-07-08 LAB — COMPREHENSIVE METABOLIC PANEL
ALT: 39 IU/L (ref 0–44)
AST: 34 IU/L (ref 0–40)
Albumin/Globulin Ratio: 1.4 (ref 1.2–2.2)
Albumin: 4.2 g/dL (ref 4.0–5.0)
Alkaline Phosphatase: 81 IU/L (ref 44–121)
BUN/Creatinine Ratio: 12 (ref 9–20)
BUN: 16 mg/dL (ref 6–20)
Bilirubin Total: <0.2 mg/dL (ref 0.0–1.2)
CO2: 22 mmol/L (ref 20–29)
Calcium: 10.3 mg/dL — ABNORMAL HIGH (ref 8.7–10.2)
Chloride: 104 mmol/L (ref 96–106)
Creatinine, Ser: 1.3 mg/dL — ABNORMAL HIGH (ref 0.76–1.27)
Globulin, Total: 3 g/dL (ref 1.5–4.5)
Glucose: 186 mg/dL — ABNORMAL HIGH (ref 70–99)
Potassium: 4.8 mmol/L (ref 3.5–5.2)
Sodium: 141 mmol/L (ref 134–144)
Total Protein: 7.2 g/dL (ref 6.0–8.5)
eGFR: 72 mL/min/{1.73_m2} (ref >59)

## 2021-07-08 LAB — CBC
Hematocrit: 48.5 % (ref 37.5–51.0)
Hemoglobin: 15.2 g/dL (ref 13.0–17.7)
MCH: 24.4 pg — ABNORMAL LOW (ref 26.6–33.0)
MCHC: 31.3 g/dL — ABNORMAL LOW (ref 31.5–35.7)
MCV: 78 fL — ABNORMAL LOW (ref 79–97)
Platelets: 167 10*3/uL (ref 150–450)
RBC: 6.23 x10E6/uL — ABNORMAL HIGH (ref 4.14–5.80)
RDW: 15 % (ref 11.6–15.4)
WBC: 5 10*3/uL (ref 3.4–10.8)

## 2021-07-08 LAB — LIPID PANEL
Chol/HDL Ratio: 3.8 ratio (ref 0.0–5.0)
Cholesterol, Total: 143 mg/dL (ref 100–199)
HDL: 38 mg/dL — ABNORMAL LOW (ref >39)
LDL Chol Calc (NIH): 56 mg/dL (ref 0–99)
Triglycerides: 315 mg/dL — ABNORMAL HIGH (ref 0–149)
VLDL Cholesterol Cal: 49 mg/dL — ABNORMAL HIGH (ref 5–40)

## 2021-07-09 ENCOUNTER — Other Ambulatory Visit (HOSPITAL_COMMUNITY): Payer: Self-pay

## 2021-07-15 ENCOUNTER — Other Ambulatory Visit (HOSPITAL_COMMUNITY): Payer: Self-pay

## 2021-08-04 ENCOUNTER — Ambulatory Visit (INDEPENDENT_AMBULATORY_CARE_PROVIDER_SITE_OTHER): Payer: 59 | Admitting: Nurse Practitioner

## 2021-08-04 ENCOUNTER — Other Ambulatory Visit: Payer: Self-pay

## 2021-08-04 ENCOUNTER — Encounter: Payer: Self-pay | Admitting: Nurse Practitioner

## 2021-08-04 VITALS — BP 150/92 | HR 90 | Temp 97.3°F | Ht >= 80 in | Wt 319.2 lb

## 2021-08-04 DIAGNOSIS — I10 Essential (primary) hypertension: Secondary | ICD-10-CM

## 2021-08-04 DIAGNOSIS — F5101 Primary insomnia: Secondary | ICD-10-CM | POA: Diagnosis not present

## 2021-08-04 DIAGNOSIS — E118 Type 2 diabetes mellitus with unspecified complications: Secondary | ICD-10-CM | POA: Diagnosis not present

## 2021-08-04 DIAGNOSIS — Z794 Long term (current) use of insulin: Secondary | ICD-10-CM | POA: Diagnosis not present

## 2021-08-04 MED ORDER — AMLODIPINE BESYLATE 5 MG PO TABS
5.0000 mg | ORAL_TABLET | Freq: Every day | ORAL | 2 refills | Status: DC
  Filled 2021-08-04: qty 30, 30d supply, fill #0
  Filled 2021-08-31 – 2021-09-30 (×2): qty 30, 30d supply, fill #1
  Filled 2021-10-26 – 2021-11-22 (×2): qty 30, 30d supply, fill #2

## 2021-08-04 MED ORDER — HYDROXYZINE HCL 10 MG PO TABS
10.0000 mg | ORAL_TABLET | Freq: Every evening | ORAL | 0 refills | Status: DC | PRN
  Filled 2021-08-04: qty 30, 30d supply, fill #0

## 2021-08-04 MED ORDER — ATORVASTATIN CALCIUM 10 MG PO TABS
10.0000 mg | ORAL_TABLET | Freq: Every day | ORAL | 11 refills | Status: DC
  Filled 2021-08-04: qty 30, 30d supply, fill #0
  Filled 2021-08-31 – 2021-09-30 (×2): qty 30, 30d supply, fill #1
  Filled 2021-10-26 – 2021-11-22 (×2): qty 30, 30d supply, fill #2
  Filled 2021-11-25 – 2022-01-14 (×3): qty 30, 30d supply, fill #3
  Filled 2022-02-09: qty 30, 30d supply, fill #4
  Filled 2022-03-09: qty 30, 30d supply, fill #5
  Filled 2022-04-03: qty 30, 30d supply, fill #6
  Filled 2022-05-06: qty 30, 30d supply, fill #7
  Filled 2022-06-14 – 2022-07-21 (×2): qty 30, 30d supply, fill #8

## 2021-08-04 NOTE — Assessment & Plan Note (Signed)
-   atorvastatin (LIPITOR) 10 MG tablet; Take 1 tablet (10 mg total) by mouth daily.  Dispense: 30 tablet; Refill: 11  2. Primary hypertension  - amLODipine (NORVASC) 5 MG tablet; Take 1 tablet (5 mg total) by mouth daily.  Dispense: 30 tablet; Refill: 2  3. Primary insomnia  - hydrOXYzine (ATARAX) 10 MG tablet; Take 1 tablet (10 mg total) by mouth at bedtime as needed.  Dispense: 30 tablet; Refill: 0   Follow up:  Follow up in 3 months

## 2021-08-04 NOTE — Progress Notes (Signed)
'@Patient'  ID: Jonathan [name], male    DOB: Apr 20, 1980, 41 y.o.   MRN: [id number]  Chief Complaint  Patient presents with   Follow-up    Pt is here for 4 weeks BP follow up visit. Pt stated he has trouble sleeping pt stated it been 7 months also pt stated OZEMPIC, 2 MG is not enough wanted to increase dosage    Referring provider: Fenton Foy, NP   HPI  Patient presents today for blood pressure follow-up.  Patient's blood pressure was elevated at last visit.  He has been trying low-salt diet.  His blood pressure still elevated today.  We discussed that we will have to start him on a low-dose blood pressure medication.  Patient is followed by endocrinology for diabetes.  Patient states that he is having issues sleeping at night.  We will trial Atarax 1 hour before bedtime for this. Denies f/c/s, n/v/d, hemoptysis, PND, chest pain or edema.     No Known Allergies  Immunization History  Administered Date(s) Administered   Influenza,inj,Quad PF,6+ Mos 01/08/2015, 02/16/2018, 04/29/2019, 01/07/2020, 01/07/2021   Moderna Sars-Covid-2 Vaccination 08/08/2019, 09/04/2020   Pneumococcal Conjugate-13 01/07/2020   Tdap 09/11/2014    Past Medical History:  Diagnosis Date   Amputation of left great toe ([place]) 01/2019   Anxiety    Decreased libido 04/2019   Diabetes mellitus    Erectile dysfunction 04/2019   Hyperglycemia 09/2019   Low testosterone in male 04/2019   Proteinuria 01/07/2020   Vitamin D deficiency 04/2019    Tobacco History: Social History   Tobacco Use  Smoking Status Never  Smokeless Tobacco Never   Counseling given: Not Answered   Outpatient Encounter Medications as of 08/04/2021  Medication Sig   amLODipine (NORVASC) 5 MG tablet Take 1 tablet (5 mg total) by mouth daily.   blood glucose meter kit and supplies Dispense based on patient and insurance preference. Use up to four times daily as directed. (FOR ICD-10 E10.9, E11.9).   clomiPHENE (CLOMID) 50 MG  tablet take 1/2 tab by mouth once daily   clomiPHENE (CLOMID) 50 MG tablet Take 1 tablet (50 mg total) by mouth daily.   empagliflozin ([name]) 25 MG TABS tablet Take 1 tablet (25 mg total) by mouth daily before breakfast.   glucose blood (ONETOUCH VERIO) test strip USE AS DIRECTED   hydrOXYzine (ATARAX) 10 MG tablet Take 1 tablet (10 mg total) by mouth at bedtime as needed.   ibuprofen (ADVIL) 800 MG tablet Take 1 tablet (800 mg total) by mouth every 8 (eight) hours as needed.   insulin glargine (LANTUS SOLOSTAR) 100 UNIT/ML Solostar Pen Inject 60 Units into the skin at bedtime.   insulin lispro (HUMALOG KWIKPEN) 100 UNIT/ML KwikPen Max daily 60 units   Lancets (ONETOUCH ULTRASOFT) lancets Use 4 times daily as directed  E11.42, Z79.4   Semaglutide, 2 MG/DOSE, (OZEMPIC, 2 MG/DOSE,) 8 MG/3ML SOPN Inject 2 mg into the skin once a week.   tadalafil (CIALIS) 5 MG tablet Take 1 tablet by once daily   atorvastatin (LIPITOR) 10 MG tablet Take 1 tablet (10 mg total) by mouth daily.   Blood Glucose Monitoring Suppl (ONETOUCH VERIO) w/Device KIT Inject 1 Units into the skin QID. Use meter 4 times daily as directed (Patient not taking: Reported on 07/07/2021)   gabapentin (NEURONTIN) 300 MG capsule Take 1 capsule (300 mg total) by mouth at bedtime. (Patient not taking: Reported on 08/04/2021)   sildenafil (VIAGRA) 100 MG tablet Take 1 tablet (100 mg  total) by mouth daily as needed for erectile dysfunction. (Patient not taking: Reported on 07/07/2021)   [DISCONTINUED] atorvastatin (LIPITOR) 10 MG tablet Take 1 tablet (10 mg total) by mouth daily. (Patient not taking: Reported on 08/04/2021)   No facility-administered encounter medications on file as of 08/04/2021.     Review of Systems  Review of Systems  Constitutional: Negative.   HENT: Negative.    Cardiovascular: Negative.   Gastrointestinal: Negative.   Allergic/Immunologic: Negative.   Neurological: Negative.   Psychiatric/Behavioral: Negative.         Physical Exam  BP (!) 150/92 (Patient Position: Sitting)   Pulse 90   Temp (!) 97.3 F (36.3 C)   Ht '6\' 8"'  (2.032 m)   Wt (!) 319 lb 3.2 oz (144.8 kg)   SpO2 100%   BMI 35.07 kg/m   Wt Readings from Last 5 Encounters:  08/04/21 (!) 319 lb 3.2 oz (144.8 kg)  07/07/21 (!) 310 lb 9.6 oz (140.9 kg)  04/26/21 (!) 304 lb (137.9 kg)  01/07/21 (!) 305 lb 0.8 oz (138.4 kg)  05/26/20 (!) 302 lb (137 kg)     Physical Exam Vitals and nursing note reviewed.  Constitutional:      General: He is not in acute distress.    Appearance: He is well-developed.  Cardiovascular:     Rate and Rhythm: Normal rate and regular rhythm.  Pulmonary:     Effort: Pulmonary effort is normal.     Breath sounds: Normal breath sounds.  Skin:    General: Skin is warm and dry.  Neurological:     Mental Status: He is alert and oriented to person, place, and time.     Lab Results:  CBC    Component Value Date/Time   WBC 5.0 07/07/2021 1052   WBC 5.2 10/21/2020 1039   RBC 6.23 (H) 07/07/2021 1052   RBC 5.52 10/21/2020 1039   HGB 15.2 07/07/2021 1052   HCT 48.5 07/07/2021 1052   PLT 167 07/07/2021 1052   MCV 78 (L) 07/07/2021 1052   MCH 24.4 (L) 07/07/2021 1052   MCH 24.8 (L) 03/10/2018 1251   MCHC 31.3 (L) 07/07/2021 1052   MCHC 31.9 10/21/2020 1039   RDW 15.0 07/07/2021 1052   LYMPHSABS 2.3 05/26/2020 1419   MONOABS 0.4 11/29/2019 0747   EOSABS 0.1 05/26/2020 1419   BASOSABS 0.0 05/26/2020 1419    BMET    Component Value Date/Time   NA 141 07/07/2021 1052   K 4.8 07/07/2021 1052   CL 104 07/07/2021 1052   CO2 22 07/07/2021 1052   GLUCOSE 186 (H) 07/07/2021 1052   GLUCOSE 83 10/21/2020 1039   BUN 16 07/07/2021 1052   CREATININE 1.30 (H) 07/07/2021 1052   CREATININE 0.95 04/12/2016 1603   CALCIUM 10.3 (H) 07/07/2021 1052   GFRNONAA 114 04/29/2019 1342   GFRNONAA >89 04/12/2016 1603   GFRAA 131 04/29/2019 1342   GFRAA >89 04/12/2016 1603    BNP No results found for:  BNP  ProBNP No results found for: PROBNP  Imaging: No results found.   Assessment & Plan:   Type 2 diabetes mellitus with complication, with long-term current use of insulin ([facility]) - atorvastatin (LIPITOR) 10 MG tablet; Take 1 tablet (10 mg total) by mouth daily.  Dispense: 30 tablet; Refill: 11  2. Primary hypertension  - amLODipine (NORVASC) 5 MG tablet; Take 1 tablet (5 mg total) by mouth daily.  Dispense: 30 tablet; Refill: 2  3. Primary insomnia  -  hydrOXYzine (ATARAX) 10 MG tablet; Take 1 tablet (10 mg total) by mouth at bedtime as needed.  Dispense: 30 tablet; Refill: 0   Follow up:  Follow up in 3 months     Fenton Foy, NP 08/04/2021

## 2021-08-04 NOTE — Patient Instructions (Addendum)
1. Type 2 diabetes mellitus with complication, with long-term current use of insulin ([facility])  - atorvastatin (LIPITOR) 10 MG tablet; Take 1 tablet (10 mg total) by mouth daily.  Dispense: 30 tablet; Refill: 11  2. Primary hypertension  - amLODipine (NORVASC) 5 MG tablet; Take 1 tablet (5 mg total) by mouth daily.  Dispense: 30 tablet; Refill: 2  3. Primary insomnia  - hydrOXYzine (ATARAX) 10 MG tablet; Take 1 tablet (10 mg total) by mouth at bedtime as needed.  Dispense: 30 tablet; Refill: 0   Follow up:  Follow up in 3 months

## 2021-08-06 ENCOUNTER — Other Ambulatory Visit: Payer: Self-pay

## 2021-08-09 ENCOUNTER — Other Ambulatory Visit (HOSPITAL_COMMUNITY): Payer: Self-pay

## 2021-08-09 ENCOUNTER — Other Ambulatory Visit: Payer: Self-pay | Admitting: Internal Medicine

## 2021-08-09 ENCOUNTER — Other Ambulatory Visit: Payer: Self-pay | Admitting: Nurse Practitioner

## 2021-08-09 DIAGNOSIS — F5101 Primary insomnia: Secondary | ICD-10-CM

## 2021-08-09 MED ORDER — TADALAFIL 5 MG PO TABS
ORAL_TABLET | ORAL | 0 refills | Status: DC
  Filled 2021-08-09: qty 30, 30d supply, fill #0

## 2021-08-10 ENCOUNTER — Other Ambulatory Visit (HOSPITAL_COMMUNITY): Payer: Self-pay

## 2021-08-11 ENCOUNTER — Other Ambulatory Visit (HOSPITAL_COMMUNITY): Payer: Self-pay

## 2021-08-12 ENCOUNTER — Other Ambulatory Visit: Payer: Self-pay

## 2021-08-12 ENCOUNTER — Other Ambulatory Visit (HOSPITAL_COMMUNITY): Payer: Self-pay

## 2021-08-17 ENCOUNTER — Ambulatory Visit: Payer: 59 | Admitting: Family

## 2021-08-27 ENCOUNTER — Encounter: Payer: Self-pay | Admitting: Internal Medicine

## 2021-08-27 ENCOUNTER — Other Ambulatory Visit: Payer: Self-pay

## 2021-08-27 ENCOUNTER — Ambulatory Visit (INDEPENDENT_AMBULATORY_CARE_PROVIDER_SITE_OTHER): Payer: 59 | Admitting: Internal Medicine

## 2021-08-27 VITALS — BP 124/82 | HR 94 | Ht >= 80 in | Wt 301.0 lb

## 2021-08-27 DIAGNOSIS — E1142 Type 2 diabetes mellitus with diabetic polyneuropathy: Secondary | ICD-10-CM | POA: Diagnosis not present

## 2021-08-27 DIAGNOSIS — E118 Type 2 diabetes mellitus with unspecified complications: Secondary | ICD-10-CM

## 2021-08-27 DIAGNOSIS — Z794 Long term (current) use of insulin: Secondary | ICD-10-CM | POA: Diagnosis not present

## 2021-08-27 LAB — POCT GLYCOSYLATED HEMOGLOBIN (HGB A1C): Hemoglobin A1C: 7.9 % — AB (ref 4.0–5.6)

## 2021-08-27 MED ORDER — TOUJEO SOLOSTAR 300 UNIT/ML ~~LOC~~ SOPN
66.0000 [IU] | PEN_INJECTOR | Freq: Every day | SUBCUTANEOUS | 3 refills | Status: DC
Start: 2021-08-27 — End: 2022-05-16
  Filled 2021-08-27 – 2021-09-30 (×3): qty 6, 27d supply, fill #0
  Filled 2021-10-26: qty 6, 27d supply, fill #1
  Filled 2021-11-22: qty 6, 27d supply, fill #2
  Filled 2021-11-25 – 2022-01-14 (×3): qty 6, 27d supply, fill #3
  Filled 2022-02-05 – 2022-03-11 (×3): qty 6, 27d supply, fill #4
  Filled 2022-04-03: qty 6, 27d supply, fill #5
  Filled 2022-05-02: qty 6, 27d supply, fill #6

## 2021-08-27 NOTE — Patient Instructions (Addendum)
-   Increase Lantus 66 units daily  - Decrease  Humalog 12 units with each meal  - Continue Ozempic 2 mg weekly  - Continue [name] 25 mg, 1 tablet every morning  -Humalog correctional insulin: ADD extra units on insulin to your meal-time Humalog dose if your blood sugars are higher than 150. Use the scale below to help guide you:   Blood sugar before meal Number of units to inject  Less than 150 0 unit  151 -  170 1 units  171 -  190 2 units  191 -  210 3 units  211 -  230 4 units  231 -  250 5 units  251 -  270 6 units  271 -  290 7 units  291 -  310 8 units  311 - 330 9 units        HOW TO TREAT LOW BLOOD SUGARS (Blood sugar LESS THAN 70 MG/DL) Please follow the RULE OF 15 for the treatment of hypoglycemia treatment (when your (blood sugars are less than 70 mg/dL)   STEP 1: Take 15 grams of carbohydrates when your blood sugar is low, which includes:  3-4 GLUCOSE TABS  OR 3-4 OZ OF JUICE OR REGULAR SODA OR ONE TUBE OF GLUCOSE GEL    STEP 2: RECHECK blood sugar in 15 MINUTES STEP 3: If your blood sugar is still low at the 15 minute recheck --> then, go back to STEP 1 and treat AGAIN with another 15 grams of carbohydrates.

## 2021-08-27 NOTE — Progress Notes (Signed)
Name: Jonathan [name]  Age/ Sex: 41 y.o., male   MRN/ DOB: [id number], Oct 03, 1980     PCP: Fenton Foy, NP   Reason for Endocrinology Evaluation: Type 2 Diabetes Mellitus  Initial Endocrine Consultative Visit: 07/15/2019    PATIENT IDENTIFIER: [name] is a 41 y.o. male with a past medical history of T2DM, hypogonadism. The patient has followed with Endocrinology clinic since 07/15/2019 for consultative assistance with management of his diabetes.  DIABETIC HISTORY:  Mr. Swart was diagnosed with DM 2014.  He has been on basal insulin, insulin mix, and Victoza in the past.. His hemoglobin A1c has ranged from 9.8% in 2021, peaking at >15.5% in 2019  He is intolerant to metformin due to diarrhea Wilder Glade was started August 2022   HYPOGONADISM HISTORY:  He was first diagnosed with hypogonadism in 2016.  He has had multiple testosterone readings with a nadir of 125 NG/DL, as well as low sex hormone binding globulin.  He was on testosterone cypionate at some point   Upon his initial presentation to our clinic his testosterone was 167 NG/DL, with inappropriately normal FSH and LH at 3.4 and 7.54 uIU/mL respectively.  Normal prolactin, TFTs and cortisol  MRI 08/18/2019 did not show any pituitary pathology  He was prescribed clomiphene in July 2021 with improvement of his testosterone levels from 1 55-260 NG/DL  His treatment has been taken over by urology by February 2023  SUBJECTIVE:   During the last visit (04/26/2021): 8.8%, we adjusted MDI regimen, and restarted [place]  Today (08/27/2021): [name] is here for follow-up on diabetes management.  He checks his blood sugars multiple times daily, through CGM. The patient has not had hypoglycemic episodes since the last clinic visit  Has tingling in the feet - used to be on medication but caused cough   Denies nausea, vomiting or diarrhea   HOME DIABETES REGIMEN:  Lantus 60 units daily Humalog 15 units 3 times  daily before every meal Ozempic 2 mg weekly (Monday) [name] 25 mg daily Correction factor: Humalog (BG -130/20)      Statin: Yes ACE-I/ARB: no    CONTINUOUS GLUCOSE MONITORING RECORD INTERPRETATION    Dates of Recording: 6/17-6/30/2023  Sensor description: dexcom  Results statistics:   CGM use % of time 93  Average and SD 172/59  Time in range     63   %  % Time Above 180 25  % Time above 250 11  % Time Below target <1      Glycemic patterns summary: Glycose elevated during the night and trends down in the morning with hyperglycemia noted again at night   Hyperglycemic episodes  postprandial   Hypoglycemic episodes occurred n/a  Overnight periods: variable      DIABETIC COMPLICATIONS: Microvascular complications:  Neuropathy Denies: CKD  Last Eye Exam: Completed 03/2021  Macrovascular complications:   Denies: CAD, CVA, PVD   HISTORY:  Past Medical History:  Past Medical History:  Diagnosis Date   Amputation of left great toe ([place]) 01/2019   Anxiety    Decreased libido 04/2019   Diabetes mellitus    Erectile dysfunction 04/2019   Hyperglycemia 09/2019   Low testosterone in male 04/2019   Proteinuria 01/07/2020   Vitamin D deficiency 04/2019   Past Surgical History:  Past Surgical History:  Procedure Laterality Date   AMPUTATION Left 02/02/2018   Procedure: LEFT GREAT TOE AMPUTATION;  Surgeon: Newt Minion, MD;  Location: [facility];  Service: Orthopedics;  Laterality: Left;  AMPUTATION Left 02/08/2019   Procedure: LEFT FOOT 1ST RAY AMPUTATION;  Surgeon: Newt Minion, MD;  Location: [facility];  Service: Orthopedics;  Laterality: Left;   Social History:  reports that he has never smoked. He has never used smokeless tobacco. He reports current alcohol use of about 2.0 standard drinks of alcohol per week. He reports that he does not use drugs. Family History:  Family History  Problem Relation Age of Onset   Other Mother        Healthy   Other  Father        Healthy     HOME MEDICATIONS: Allergies as of 08/27/2021   No Known Allergies      Medication List        Accurate as of August 27, 2021  2:43 PM. If you have any questions, ask your nurse or doctor.          STOP taking these medications    sildenafil 100 MG tablet Commonly known as: VIAGRA Stopped by: Dorita Sciara, MD       TAKE these medications    amLODipine 5 MG tablet Commonly known as: NORVASC Take 1 tablet (5 mg total) by mouth daily.   atorvastatin 10 MG tablet Commonly known as: LIPITOR Take 1 tablet (10 mg total) by mouth daily.   blood glucose meter kit and supplies Dispense based on patient and insurance preference. Use up to four times daily as directed. (FOR ICD-10 E10.9, E11.9).   Clomid 50 MG tablet Generic drug: clomiPHENE take 1/2 tab by mouth once daily   Clomid 50 MG tablet Generic drug: clomiPHENE Take 1 tablet (50 mg total) by mouth daily.   gabapentin 300 MG capsule Commonly known as: NEURONTIN Take 1 capsule (300 mg total) by mouth at bedtime.   HumaLOG KwikPen 100 UNIT/ML KwikPen Generic drug: insulin lispro Max daily 60 units   hydrOXYzine 10 MG tablet Commonly known as: ATARAX Take 1 tablet (10 mg total) by mouth at bedtime as needed.   ibuprofen 800 MG tablet Commonly known as: ADVIL Take 1 tablet (800 mg total) by mouth every 8 (eight) hours as needed.   [name] 25 MG Tabs tablet Generic drug: empagliflozin Take 1 tablet (25 mg total) by mouth daily before breakfast.   Lantus SoloStar 100 UNIT/ML Solostar Pen Generic drug: insulin glargine Inject 60 Units into the skin at bedtime.   onetouch ultrasoft lancets Use 4 times daily as directed  E11.42, Z79.4   OneTouch Verio test strip Generic drug: glucose blood USE AS DIRECTED   OneTouch Verio w/Device Kit Inject 1 Units into the skin QID. Use meter 4 times daily as directed   Ozempic (2 MG/DOSE) 8 MG/3ML Sopn Generic drug:  Semaglutide (2 MG/DOSE) Inject 2 mg into the skin once a week.   tadalafil 5 MG tablet Commonly known as: CIALIS Take 1 tablet by mouth every day.         OBJECTIVE:   Vital Signs: BP 124/82 (BP Location: Left Arm, Patient Position: Sitting, Cuff Size: Large)   Pulse 94   Ht '6\' 8"'  (2.032 m)   Wt (!) 301 lb (136.5 kg)   SpO2 96%   BMI 33.07 kg/m   Wt Readings from Last 3 Encounters:  08/27/21 (!) 301 lb (136.5 kg)  08/04/21 (!) 319 lb 3.2 oz (144.8 kg)  07/07/21 (!) 310 lb 9.6 oz (140.9 kg)     Exam: General: Pt appears well and is in NAD  Hydration: Well-hydrated  with moist mucous membranes and good skin turgor  HEENT: Head: Unremarkable with good dentition. Oropharynx clear without exudate.  Eyes: External eye exam normal without stare, lid lag or exophthalmos.  EOM intact.  PERRL.  Neck: General: Supple without adenopathy. Thyroid: Thyroid size normal.  No goiter or nodules appreciated. No thyroid bruit.  Lungs: Clear with good BS bilat with no rales, rhonchi, or wheezes  Heart: RRR with normal S1 and S2 and no gallops; no murmurs; no rub  Abdomen: Normoactive bowel sounds, soft, nontender, without masses or organomegaly palpable  Extremities: No pretibial edema. No tremor. Normal strength and motion throughout. See detailed diabetic foot exam below.  Skin: Normal texture and temperature to palpation. No rash noted. No Acanthosis nigricans/skin tags. No lipohypertrophy.  Neuro: MS is good with appropriate affect, pt is alert and Ox3    DM foot exam: 08/27/2021  The skin of the feet is without sores or ulcerations, S/P left great toe amputations  The pedal pulses are 1+ on right and 1+ on left. The sensation is decreased to a screening 5.07, 10 gram monofilament bilaterally            DATA REVIEWED:  Lab Results  Component Value Date   HGBA1C 8.8 (A) 04/26/2021   HGBA1C 7.9 (A) 10/23/2020   HGBA1C 9.8 (A) 01/07/2020   HGBA1C 9.8 01/07/2020   HGBA1C 9.8  (A) 01/07/2020   HGBA1C 9.8 (A) 01/07/2020    Latest Reference Range & Units 07/07/21 10:52  COMPREHENSIVE METABOLIC PANEL  Rpt !  Sodium 134 - 144 mmol/L 141  Potassium 3.5 - 5.2 mmol/L 4.8  Chloride 96 - 106 mmol/L 104  CO2 20 - 29 mmol/L 22  Glucose 70 - 99 mg/dL 186 (H)  BUN 6 - 20 mg/dL 16  Creatinine 0.76 - 1.27 mg/dL 1.30 (H)  Calcium 8.7 - 10.2 mg/dL 10.3 (H)  BUN/Creatinine Ratio 9 - 20  12  eGFR >59 mL/min/1.73 72  Alkaline Phosphatase 44 - 121 IU/L 81  Albumin 4.0 - 5.0 g/dL 4.2  Albumin/Globulin Ratio 1.2 - 2.2  1.4  AST 0 - 40 IU/L 34  ALT 0 - 44 IU/L 39  Total Protein 6.0 - 8.5 g/dL 7.2  Total Bilirubin 0.0 - 1.2 mg/dL <0.2  Total CHOL/HDL Ratio 0.0 - 5.0 ratio 3.8  Cholesterol, Total 100 - 199 mg/dL 143  HDL Cholesterol >39 mg/dL 38 (L)  Triglycerides 0 - 149 mg/dL 315 (H)  VLDL Cholesterol Cal 5 - 40 mg/dL 49 (H)  LDL Chol Calc (NIH) 0 - 99 mg/dL 56    ASSESSMENT / PLAN / RECOMMENDATIONS:   1) Type 2 Diabetes Mellitus, Sub-Optimally controlled, With neuropathic  complications  - Most recent A1c of 7.9 %. Goal A1c < 7.0 %.     - Praised the pt on improved glycemic control  - A1c down from 8.8 % to 7.9 %  - In reviewing his dexcom data, pt has been noted with hyperglycemia at night and tight BG's in the day  - Will increase basal rate and decrease prandial insulin as below  -We will try switching Lantus to Toujeo as this more concentrated   MEDICATIONS: Increase Lantus/Toujeo to 66 units daily Decrease Humalog 12 units 3 times daily before every meal Continue Ozempic 2 mg weekly Continue [name] 25 mg daily Continue correction factor: Humalog (BG -130/20)  EDUCATION / INSTRUCTIONS: BG monitoring instructions: Patient is instructed to check his blood sugars 3 times a day, before meals . Call [facility]  Endocrinology clinic if: BG persistently < 70  I reviewed the Rule of 15 for the treatment of hypoglycemia in detail with the patient. Literature  supplied.   2) Diabetic complications:  Eye: Does not have known diabetic retinopathy.  Neuro/ Feet: Does  have known diabetic peripheral neuropathy .  Renal: Patient does not have known baseline CKD. He   is not on an ACEI/ARB at present.      F/U in 6 months   Signed electronically by: Mack Guise, MD  [facility]  [facility] [place], [place] [address] Phone: [phone] FAX: [phone]   CC: Fenton Foy, NP [facility] [address] [place] [address] Phone: [phone]  Fax: [phone]  Return to Endocrinology clinic as below: Future Appointments  Date Time Provider [place]  11/04/2021 10:00 AM Fenton Foy, NP [facility] None

## 2021-09-01 ENCOUNTER — Other Ambulatory Visit (HOSPITAL_COMMUNITY): Payer: Self-pay

## 2021-09-03 ENCOUNTER — Other Ambulatory Visit: Payer: Self-pay

## 2021-09-05 ENCOUNTER — Other Ambulatory Visit (HOSPITAL_COMMUNITY): Payer: Self-pay

## 2021-09-06 ENCOUNTER — Other Ambulatory Visit (HOSPITAL_COMMUNITY): Payer: Self-pay

## 2021-09-06 ENCOUNTER — Other Ambulatory Visit: Payer: Self-pay

## 2021-09-06 MED ORDER — TADALAFIL 5 MG PO TABS
5.0000 mg | ORAL_TABLET | Freq: Every day | ORAL | 0 refills | Status: DC
  Filled 2021-09-06 – 2021-09-30 (×2): qty 30, 30d supply, fill #0

## 2021-09-07 ENCOUNTER — Other Ambulatory Visit (HOSPITAL_COMMUNITY): Payer: Self-pay

## 2021-09-08 ENCOUNTER — Other Ambulatory Visit (HOSPITAL_COMMUNITY): Payer: Self-pay

## 2021-09-14 ENCOUNTER — Other Ambulatory Visit (HOSPITAL_COMMUNITY): Payer: Self-pay

## 2021-09-20 ENCOUNTER — Other Ambulatory Visit (HOSPITAL_COMMUNITY): Payer: Self-pay

## 2021-09-30 ENCOUNTER — Other Ambulatory Visit (HOSPITAL_COMMUNITY): Payer: Self-pay

## 2021-10-05 ENCOUNTER — Other Ambulatory Visit (HOSPITAL_COMMUNITY): Payer: Self-pay

## 2021-10-26 ENCOUNTER — Other Ambulatory Visit (HOSPITAL_COMMUNITY): Payer: Self-pay

## 2021-10-26 ENCOUNTER — Other Ambulatory Visit: Payer: Self-pay | Admitting: Nurse Practitioner

## 2021-10-26 DIAGNOSIS — F5101 Primary insomnia: Secondary | ICD-10-CM

## 2021-10-27 ENCOUNTER — Other Ambulatory Visit (HOSPITAL_COMMUNITY): Payer: Self-pay

## 2021-10-27 MED ORDER — TADALAFIL 5 MG PO TABS
5.0000 mg | ORAL_TABLET | Freq: Every day | ORAL | 0 refills | Status: DC
  Filled 2021-10-27 – 2021-11-22 (×2): qty 30, 30d supply, fill #0

## 2021-10-27 MED ORDER — CLOMID 50 MG PO TABS
25.0000 mg | ORAL_TABLET | Freq: Every day | ORAL | 3 refills | Status: DC
  Filled 2021-10-27 – 2021-11-22 (×2): qty 15, 30d supply, fill #0
  Filled 2021-11-25 – 2022-01-14 (×3): qty 15, 30d supply, fill #1
  Filled 2022-02-09: qty 15, 30d supply, fill #2
  Filled 2022-03-09 – 2022-04-03 (×4): qty 15, 30d supply, fill #3

## 2021-10-28 ENCOUNTER — Other Ambulatory Visit (HOSPITAL_COMMUNITY): Payer: Self-pay

## 2021-11-04 ENCOUNTER — Ambulatory Visit: Payer: Self-pay | Admitting: Nurse Practitioner

## 2021-11-05 ENCOUNTER — Other Ambulatory Visit (HOSPITAL_COMMUNITY): Payer: Self-pay

## 2021-11-19 ENCOUNTER — Other Ambulatory Visit: Payer: Self-pay

## 2021-11-19 ENCOUNTER — Encounter (HOSPITAL_COMMUNITY): Payer: Self-pay | Admitting: Emergency Medicine

## 2021-11-19 ENCOUNTER — Emergency Department (HOSPITAL_COMMUNITY)
Admission: EM | Admit: 2021-11-19 | Discharge: 2021-11-20 | Disposition: A | Discharge status: Home / Self Care | Payer: 59 | Attending: Emergency Medicine | Admitting: Emergency Medicine

## 2021-11-19 DIAGNOSIS — K047 Periapical abscess without sinus: Secondary | ICD-10-CM | POA: Insufficient documentation

## 2021-11-19 DIAGNOSIS — Z794 Long term (current) use of insulin: Secondary | ICD-10-CM | POA: Diagnosis not present

## 2021-11-19 DIAGNOSIS — R519 Headache, unspecified: Secondary | ICD-10-CM | POA: Diagnosis present

## 2021-11-19 LAB — CBC WITH DIFFERENTIAL/PLATELET
Abs Immature Granulocytes: 0.05 10*3/uL (ref 0.00–0.07)
Basophils Absolute: 0 10*3/uL (ref 0.0–0.1)
Basophils Relative: 0 %
Eosinophils Absolute: 0.1 10*3/uL (ref 0.0–0.5)
Eosinophils Relative: 1 %
HCT: 43.1 % (ref 39.0–52.0)
Hemoglobin: 13.6 g/dL (ref 13.0–17.0)
Immature Granulocytes: 0 %
Lymphocytes Relative: 16 %
Lymphs Abs: 1.7 10*3/uL (ref 0.7–4.0)
MCH: 25 pg — ABNORMAL LOW (ref 26.0–34.0)
MCHC: 31.6 g/dL (ref 30.0–36.0)
MCV: 79.4 fL — ABNORMAL LOW (ref 80.0–100.0)
Monocytes Absolute: 0.9 10*3/uL (ref 0.1–1.0)
Monocytes Relative: 8 %
Neutro Abs: 8.4 10*3/uL — ABNORMAL HIGH (ref 1.7–7.7)
Neutrophils Relative %: 75 %
Platelets: 142 10*3/uL — ABNORMAL LOW (ref 150–400)
RBC: 5.43 MIL/uL (ref 4.22–5.81)
RDW: 13.6 % (ref 11.5–15.5)
WBC: 11.2 10*3/uL — ABNORMAL HIGH (ref 4.0–10.5)
nRBC: 0 % (ref 0.0–0.2)

## 2021-11-19 LAB — COMPREHENSIVE METABOLIC PANEL
ALT: 27 U/L (ref 0–44)
AST: 22 U/L (ref 15–41)
Albumin: 3.3 g/dL — ABNORMAL LOW (ref 3.5–5.0)
Alkaline Phosphatase: 60 U/L (ref 38–126)
Anion gap: 6 (ref 5–15)
BUN: 10 mg/dL (ref 6–20)
CO2: 26 mmol/L (ref 22–32)
Calcium: 9.2 mg/dL (ref 8.9–10.3)
Chloride: 106 mmol/L (ref 98–111)
Creatinine, Ser: 1.31 mg/dL — ABNORMAL HIGH (ref 0.61–1.24)
GFR, Estimated: >60 mL/min (ref >60)
Glucose, Bld: 163 mg/dL — ABNORMAL HIGH (ref 70–99)
Potassium: 4.1 mmol/L (ref 3.5–5.1)
Sodium: 138 mmol/L (ref 135–145)
Total Bilirubin: 0.3 mg/dL (ref 0.3–1.2)
Total Protein: 6.6 g/dL (ref 6.5–8.1)

## 2021-11-19 LAB — LACTIC ACID, PLASMA: Lactic Acid, Venous: 2.2 mmol/L (ref 0.5–1.9)

## 2021-11-19 NOTE — ED Triage Notes (Signed)
R facial and tooth pain that started yesterday. Now complaining of fever and not able to open jaw completely, pain with swallowing, noted to be tachycardic in triage at 110bpm.

## 2021-11-20 ENCOUNTER — Emergency Department (HOSPITAL_COMMUNITY): Payer: 59

## 2021-11-20 LAB — LACTIC ACID, PLASMA: Lactic Acid, Venous: 1.6 mmol/L (ref 0.5–1.9)

## 2021-11-20 MED ORDER — HYDROMORPHONE HCL 1 MG/ML IJ SOLN
1.0000 mg | Freq: Once | INTRAMUSCULAR | Status: AC
  Administered 2021-11-20: 1 mg via INTRAVENOUS
  Filled 2021-11-20: qty 1

## 2021-11-20 MED ORDER — IOHEXOL 350 MG/ML SOLN
75.0000 mL | Freq: Once | INTRAVENOUS | Status: AC | PRN
  Administered 2021-11-20: 75 mL via INTRAVENOUS

## 2021-11-20 MED ORDER — OXYCODONE-ACETAMINOPHEN 5-325 MG PO TABS: 1.0000 | ORAL_TABLET | ORAL | 0 refills | Status: AC | PRN

## 2021-11-20 MED ORDER — CLINDAMYCIN HCL 150 MG PO CAPS
300.0000 mg | ORAL_CAPSULE | Freq: Once | ORAL | Status: AC
  Administered 2021-11-20: 300 mg via ORAL
  Filled 2021-11-20: qty 2

## 2021-11-20 MED ORDER — CLINDAMYCIN HCL 150 MG PO CAPS: 150.0000 mg | ORAL_CAPSULE | Freq: Three times a day (TID) | ORAL | 0 refills | Status: AC

## 2021-11-20 NOTE — ED Notes (Addendum)
Unsuccessful IV placement x3 total from 2 RN. IV team consulted

## 2021-11-20 NOTE — ED Notes (Signed)
Pt returned from c-t  still having pain but it may be a little better

## 2021-11-20 NOTE — ED Notes (Signed)
Spoke to EDP about pt case, would like to wait until pt exam to provide additional orders for imaging etc

## 2021-11-20 NOTE — ED Notes (Signed)
Will move to ER bed asap when available , charge nurse notified .

## 2021-11-20 NOTE — ED Provider Notes (Signed)
[facility] EMERGENCY DEPARTMENT Provider Note   CSN: [id number] Arrival date & time: 11/19/21  2148     History  Chief Complaint  Patient presents with   Fever   Dental Pain    Jonathan [name] is a 41 y.o. male.  41 year old male who presents the ER today with right-sided facial pain and trismus.  Patient states that been on and off for the last few weeks but specifically last 24 hours he has had pretty severe pain to his right lower jaw.  Saw dentist a couple months ago for similar symptoms but not as bad and at that time he was fine.  No nausea or vomiting.  Now also with odynophagia.   Fever Dental Pain Associated symptoms: fever        Home Medications Prior to Admission medications   Medication Sig Start Date End Date Taking? Authorizing Provider  clindamycin (CLEOCIN) 150 MG capsule Take 1 capsule (150 mg total) by mouth 3 (three) times daily for 7 days. 11/20/21 11/27/21 Yes Zameria Vogl, Corene Cornea, MD  oxyCODONE-acetaminophen (PERCOCET) 5-325 MG tablet Take 1-2 tablets by mouth every 4 (four) hours as needed. 11/20/21  Yes Leanah Kolander, Corene Cornea, MD  amLODipine (NORVASC) 5 MG tablet Take 1 tablet (5 mg total) by mouth daily. 08/04/21 11/05/21  Fenton Foy, NP  atorvastatin (LIPITOR) 10 MG tablet Take 1 tablet (10 mg total) by mouth daily. 08/04/21   Fenton Foy, NP  blood glucose meter kit and supplies Dispense based on patient and insurance preference. Use up to four times daily as directed. (FOR ICD-10 E10.9, E11.9). 02/03/18   Shelly Coss, MD  Blood Glucose Monitoring Suppl (ONETOUCH VERIO) w/Device KIT Inject 1 Units into the skin QID. Use meter 4 times daily as directed 05/17/19   Azzie Glatter, FNP  clomiPHENE (CLOMID) 50 MG tablet Take 1 tablet (50 mg total) by mouth daily. 05/10/21   Shamleffer, Melanie Crazier, MD  clomiPHENE (CLOMID) 50 MG tablet Take 1/2 tablet by mouth daily 10/27/21     empagliflozin ([name]) 25 MG TABS tablet Take 1 tablet (25 mg  total) by mouth daily before breakfast. 06/15/21   Shamleffer, Melanie Crazier, MD  gabapentin (NEURONTIN) 300 MG capsule Take 1 capsule (300 mg total) by mouth at bedtime. 01/07/20   Azzie Glatter, FNP  glucose blood [facility] VERIO) test strip USE AS DIRECTED 05/26/21   Vevelyn Francois, NP  hydrOXYzine (ATARAX) 10 MG tablet Take 1 tablet (10 mg total) by mouth at bedtime as needed. 08/04/21   Fenton Foy, NP  ibuprofen (ADVIL) 800 MG tablet Take 1 tablet (800 mg total) by mouth every 8 (eight) hours as needed. 01/07/20   Azzie Glatter, FNP  insulin glargine, 1 Unit Dial, (TOUJEO SOLOSTAR) 300 UNIT/ML Solostar Pen Inject 66 Units into the skin daily in the afternoon. 08/27/21   Shamleffer, Melanie Crazier, MD  insulin lispro (HUMALOG KWIKPEN) 100 UNIT/ML KwikPen Max daily 60 units 04/26/21   Shamleffer, Melanie Crazier, MD  Lancets [facility] ULTRASOFT) lancets Use 4 times daily as directed  E11.42, Z79.4 01/07/20   Azzie Glatter, FNP  Semaglutide, 2 MG/DOSE, (OZEMPIC, 2 MG/DOSE,) 8 MG/3ML SOPN Inject 2 mg into the skin once a week. 06/14/21   Shamleffer, Melanie Crazier, MD  tadalafil (CIALIS) 5 MG tablet Take 1 tablet (5 mg total) by mouth daily. 10/27/21         Allergies    Patient has no known allergies.    Review of Systems  Review of Systems  Constitutional:  Positive for fever.    Physical Exam Updated Vital Signs BP 131/80   Pulse 83   Temp 98.3 F (36.8 C)   Resp 18   SpO2 99%  Physical Exam Vitals and nursing note reviewed.  Constitutional:      Appearance: He is well-developed.  HENT:     Head: Normocephalic and atraumatic.     Comments: Right-sided submandibular tenderness with mild induration.  Not warm or erythematous. Eyes:     Pupils: Pupils are equal, round, and reactive to light.  Cardiovascular:     Rate and Rhythm: Normal rate.  Pulmonary:     Effort: Pulmonary effort is normal. No respiratory distress.  Abdominal:     General: Abdomen is flat.  There is no distension.  Musculoskeletal:        General: Normal range of motion.     Cervical back: Normal range of motion.  Neurological:     Mental Status: He is alert.     ED Results / Procedures / Treatments   Labs (all labs ordered are listed, but only abnormal results are displayed) Labs Reviewed  LACTIC ACID, PLASMA - Abnormal; Notable for the following components:      Result Value   Lactic Acid, Venous 2.2 (*)    All other components within normal limits  COMPREHENSIVE METABOLIC PANEL - Abnormal; Notable for the following components:   Glucose, Bld 163 (*)    Creatinine, Ser 1.31 (*)    Albumin 3.3 (*)    All other components within normal limits  CBC WITH DIFFERENTIAL/PLATELET - Abnormal; Notable for the following components:   WBC 11.2 (*)    MCV 79.4 (*)    MCH 25.0 (*)    Platelets 142 (*)    Neutro Abs 8.4 (*)    All other components within normal limits  LACTIC ACID, PLASMA    EKG None  Radiology CT Soft Tissue Neck W Contrast  Result Date: 11/20/2021 CLINICAL DATA:  41 year old male with jaw pain and swelling. Right tooth and facial pain since yesterday, now with fever. EXAM: CT NECK WITH CONTRAST TECHNIQUE: Multidetector CT imaging of the neck was performed using the standard protocol following the bolus administration of intravenous contrast. RADIATION DOSE REDUCTION: This exam was performed according to the departmental dose-optimization program which includes automated exposure control, adjustment of the mA and/or kV according to patient size and/or use of iterative reconstruction technique. CONTRAST:  44m OMNIPAQUE IOHEXOL 350 MG/ML SOLN COMPARISON:  None Available. FINDINGS: Pharynx and larynx: Negative larynx and epiglottis. Symmetric mild palatine tonsil and adenoid hypertrophy. Mild inflammation in the right parapharyngeal space. But no tonsillar abscess identified. Left parapharyngeal and retropharyngeal spaces remain within normal limits. Salivary  glands: Evidence of inflammation in the lower right masticator space. Inflammation in the right submandibular space. Sublingual space is spared. Left-side submandibular and masticator spaces appear normal. Bilateral parotid glands are within normal limits. Thyroid: Negative. Lymph nodes: Small but reactive appearing level 1 lymph nodes in the 1 a station and right 1 B station. Similar small but reactive appearing right level 2 and level 3 lymph nodes up to 11 mm short axis series 3, image 60. No cystic or necrotic lymph nodes. Vascular: Major vascular structures in the neck and at the skull base appear to be patent including the right IJ. Limited intracranial: Negative. Visualized orbits: Negative. Mastoids and visualized paranasal sinuses: Clear aside from minimal bilateral maxillary alveolar recess mucosal thickening. Skeleton: Carious right  maxillary posterior molar, right mandible wisdom tooth. Both demonstrate mild periapical lucency. Right maxillary trace dehiscence on series 4, image 37. But regional soft tissue inflammation appears more concentrated at the mandible wisdom tooth. Subperiosteal inflammation there although no obvious dehiscence or abscess at this time. Contralateral carious left maxillary wisdom tooth with a greater degree of periapical lucency. Chronic disc and endplate degeneration in the lower cervical spine. Congenital spina bifida occulta C7 and T1. No other acute osseous abnormality. Upper chest: Negative lung apices. IMPRESSION: Odontogenic inflammation suspected in the right neck at the lower righ masticator space, right parapharyngeal and submandibular spaces. Suspect this is related to carious right mandible wisdom tooth, although nearby carious right maxillary posterior molar also. Reactive lymphadenopathy, but no abscess or other complicating features. Electronically Signed   By: Genevie Ann M.D.   On: 11/20/2021 05:02    Procedures Procedures    Medications Ordered in  ED Medications  HYDROmorphone (DILAUDID) injection 1 mg (1 mg Intravenous Given 11/20/21 0344)  iohexol (OMNIPAQUE) 350 MG/ML injection 75 mL (75 mLs Intravenous Contrast Given 11/20/21 0442)  clindamycin (CLEOCIN) capsule 300 mg (300 mg Oral Given 11/20/21 1947)    ED Course/ Medical Decision Making/ A&P                           Medical Decision Making Amount and/or Complexity of Data Reviewed Labs: ordered. Radiology: ordered.  Risk Prescription drug management.   Will ct for possible abscess vs sialadenitis vs lympadenitis.   Ct with infection in mouth likely related to dental infection. No ludwigs on exam or on e/o deep neck space infection on the ct scan. Abx started. Tolerating PO. Stable for discharge.   Final Clinical Impression(s) / ED Diagnoses Final diagnoses:  Dental infection    Rx / DC Orders ED Discharge Orders          Ordered    clindamycin (CLEOCIN) 150 MG capsule  3 times daily        11/20/21 0726    oxyCODONE-acetaminophen (PERCOCET) 5-325 MG tablet  Every 4 hours PRN        11/20/21 0726              Shemeka Wardle, Corene Cornea, MD 11/20/21 2312

## 2021-11-20 NOTE — ED Notes (Signed)
To ct

## 2021-11-22 ENCOUNTER — Other Ambulatory Visit (HOSPITAL_COMMUNITY): Payer: Self-pay

## 2021-11-23 ENCOUNTER — Other Ambulatory Visit (HOSPITAL_COMMUNITY): Payer: Self-pay

## 2021-11-24 ENCOUNTER — Other Ambulatory Visit (HOSPITAL_COMMUNITY): Payer: Self-pay

## 2021-11-25 ENCOUNTER — Encounter (HOSPITAL_COMMUNITY): Payer: Self-pay | Admitting: Pharmacist

## 2021-11-25 ENCOUNTER — Other Ambulatory Visit: Payer: Self-pay | Admitting: Nurse Practitioner

## 2021-11-25 ENCOUNTER — Other Ambulatory Visit (HOSPITAL_COMMUNITY): Payer: Self-pay

## 2021-11-25 ENCOUNTER — Other Ambulatory Visit: Payer: Self-pay | Admitting: Internal Medicine

## 2021-11-25 DIAGNOSIS — I10 Essential (primary) hypertension: Secondary | ICD-10-CM

## 2021-11-27 ENCOUNTER — Encounter (HOSPITAL_COMMUNITY): Payer: Self-pay | Admitting: Pharmacist

## 2021-11-27 ENCOUNTER — Other Ambulatory Visit (HOSPITAL_COMMUNITY): Payer: Self-pay

## 2021-12-09 ENCOUNTER — Encounter: Payer: Self-pay | Admitting: Nurse Practitioner

## 2021-12-09 ENCOUNTER — Ambulatory Visit (INDEPENDENT_AMBULATORY_CARE_PROVIDER_SITE_OTHER): Payer: 59 | Admitting: Nurse Practitioner

## 2021-12-09 ENCOUNTER — Ambulatory Visit: Payer: Self-pay | Admitting: Nurse Practitioner

## 2021-12-09 VITALS — BP 124/72 | HR 96 | Temp 98.1°F | Ht >= 80 in | Wt 291.0 lb

## 2021-12-09 DIAGNOSIS — K047 Periapical abscess without sinus: Secondary | ICD-10-CM

## 2021-12-09 DIAGNOSIS — Z794 Long term (current) use of insulin: Secondary | ICD-10-CM

## 2021-12-09 DIAGNOSIS — E118 Type 2 diabetes mellitus with unspecified complications: Secondary | ICD-10-CM | POA: Diagnosis not present

## 2021-12-09 LAB — POCT GLYCOSYLATED HEMOGLOBIN (HGB A1C): HbA1c, POC (controlled diabetic range): 7.1 % — AB (ref 0.0–7.0)

## 2021-12-09 NOTE — Progress Notes (Signed)
$'@Patient'g$  ID: Jonathan [name], male    DOB: 13-Apr-1980, 41 y.o.   MRN: [id number]  Chief Complaint  Patient presents with   Hospitalization Follow-up    No concerns.    Referring provider: Fenton Foy, NP   HPI  Patient presents today for ED follow-up.  He was seen in the ED on 11/19/2021 for dental infection.  He was treated with clindamycin.  He states that he is much better now.  We discussed that he does need to follow-up with a dentist.  We gave him a list of dentist to choose from to call and make an appointment.  Patient is followed by endocrinology for diabetes.  He is compliant with medications. Denies f/c/s, n/v/d, hemoptysis, PND, leg swelling Denies chest pain or edema      No Known Allergies  Immunization History  Administered Date(s) Administered   Influenza,inj,Quad PF,6+ Mos 01/08/2015, 02/16/2018, 04/29/2019, 01/07/2020, 01/07/2021   Moderna Sars-Covid-2 Vaccination 08/08/2019, 09/04/2020   Pneumococcal Conjugate-13 01/07/2020   Tdap 09/11/2014    Past Medical History:  Diagnosis Date   Amputation of left great toe ([place]) 01/2019   Anxiety    Decreased libido 04/2019   Diabetes mellitus    Erectile dysfunction 04/2019   Hyperglycemia 09/2019   Low testosterone in male 04/2019   Proteinuria 01/07/2020   Vitamin D deficiency 04/2019    Tobacco History: Social History   Tobacco Use  Smoking Status Never  Smokeless Tobacco Never   Counseling given: Not Answered   Outpatient Encounter Medications as of 12/09/2021  Medication Sig   amLODipine (NORVASC) 5 MG tablet Take 1 tablet (5 mg total) by mouth daily.   atorvastatin (LIPITOR) 10 MG tablet Take 1 tablet (10 mg total) by mouth daily.   blood glucose meter kit and supplies Dispense based on patient and insurance preference. Use up to four times daily as directed. (FOR ICD-10 E10.9, E11.9).   Blood Glucose Monitoring Suppl (ONETOUCH VERIO) w/Device KIT Inject 1 Units into the skin QID. Use  meter 4 times daily as directed   clomiPHENE (CLOMID) 50 MG tablet Take 1 tablet (50 mg total) by mouth daily.   clomiPHENE (CLOMID) 50 MG tablet Take 0.5 tablets (25 mg total) by mouth daily.   empagliflozin ([name]) 25 MG TABS tablet Take 1 tablet (25 mg total) by mouth daily before breakfast.   gabapentin (NEURONTIN) 300 MG capsule Take 1 capsule (300 mg total) by mouth at bedtime.   glucose blood (ONETOUCH VERIO) test strip USE AS DIRECTED   insulin glargine, 1 Unit Dial, (TOUJEO SOLOSTAR) 300 UNIT/ML Solostar Pen Inject 66 Units into the skin daily in the afternoon.   insulin lispro (HUMALOG KWIKPEN) 100 UNIT/ML KwikPen Max daily 60 units   Lancets (ONETOUCH ULTRASOFT) lancets Use 4 times daily as directed  E11.42, Z79.4   oxyCODONE-acetaminophen (PERCOCET) 5-325 MG tablet Take 1-2 tablets by mouth every 4 (four) hours as needed.   Semaglutide, 2 MG/DOSE, (OZEMPIC, 2 MG/DOSE,) 8 MG/3ML SOPN Inject 2 mg into the skin once a week.   hydrOXYzine (ATARAX) 10 MG tablet Take 1 tablet (10 mg total) by mouth at bedtime as needed. (Patient not taking: Reported on 12/09/2021)   ibuprofen (ADVIL) 800 MG tablet Take 1 tablet (800 mg total) by mouth every 8 (eight) hours as needed. (Patient not taking: Reported on 12/09/2021)   tadalafil (CIALIS) 5 MG tablet Take 1 tablet (5 mg total) by mouth daily.   No facility-administered encounter medications on file as of 12/09/2021.  Review of Systems  Review of Systems  Constitutional: Negative.   HENT: Negative.    Cardiovascular: Negative.   Gastrointestinal: Negative.   Allergic/Immunologic: Negative.   Neurological: Negative.   Psychiatric/Behavioral: Negative.         Physical Exam  BP 124/72   Pulse 96   Temp 98.1 F (36.7 C) (Temporal)   Ht _0  (2.032 m)   Wt 291 lb (132 kg)   SpO2 99%   BMI 31.97 kg/m   Wt Readings from Last 5 Encounters:  12/09/21 291 lb (132 kg)  08/27/21 (!) 301 lb (136.5 kg)  08/04/21 (!) 319 lb  3.2 oz (144.8 kg)  07/07/21 (!) 310 lb 9.6 oz (140.9 kg)  04/26/21 (!) 304 lb (137.9 kg)     Physical Exam Vitals and nursing note reviewed.  Constitutional:      General: He is not in acute distress.    Appearance: He is well-developed.  Cardiovascular:     Rate and Rhythm: Normal rate and regular rhythm.  Pulmonary:     Effort: Pulmonary effort is normal.     Breath sounds: Normal breath sounds.  Skin:    General: Skin is warm and dry.  Neurological:     Mental Status: He is alert and oriented to person, place, and time.      Lab Results:  CBC    Component Value Date/Time   WBC 11.2 (H) 11/19/2021 2250   RBC 5.43 11/19/2021 2250   HGB 13.6 11/19/2021 2250   HGB 15.2 07/07/2021 1052   HCT 43.1 11/19/2021 2250   HCT 48.5 07/07/2021 1052   PLT 142 (L) 11/19/2021 2250   PLT 167 07/07/2021 1052   MCV 79.4 (L) 11/19/2021 2250   MCV 78 (L) 07/07/2021 1052   MCH 25.0 (L) 11/19/2021 2250   MCHC 31.6 11/19/2021 2250   RDW 13.6 11/19/2021 2250   RDW 15.0 07/07/2021 1052   LYMPHSABS 1.7 11/19/2021 2250   LYMPHSABS 2.3 05/26/2020 1419   MONOABS 0.9 11/19/2021 2250   EOSABS 0.1 11/19/2021 2250   EOSABS 0.1 05/26/2020 1419   BASOSABS 0.0 11/19/2021 2250   BASOSABS 0.0 05/26/2020 1419    BMET    Component Value Date/Time   NA 138 11/19/2021 2250   NA 141 07/07/2021 1052   K 4.1 11/19/2021 2250   CL 106 11/19/2021 2250   CO2 26 11/19/2021 2250   GLUCOSE 163 (H) 11/19/2021 2250   BUN 10 11/19/2021 2250   BUN 16 07/07/2021 1052   CREATININE 1.31 (H) 11/19/2021 2250   CREATININE 0.95 04/12/2016 1603   CALCIUM 9.2 11/19/2021 2250   GFRNONAA >60 11/19/2021 2250   GFRNONAA >89 04/12/2016 1603   GFRAA 131 04/29/2019 1342   GFRAA >89 04/12/2016 1603    BNP No results found for: "BNP"  ProBNP No results found for: "PROBNP"  Imaging: CT Soft Tissue Neck W Contrast  Result Date: 11/20/2021 CLINICAL DATA:  41 year old male with jaw pain and swelling. Right tooth  and facial pain since yesterday, now with fever. EXAM: CT NECK WITH CONTRAST TECHNIQUE: Multidetector CT imaging of the neck was performed using the standard protocol following the bolus administration of intravenous contrast. RADIATION DOSE REDUCTION: This exam was performed according to the departmental dose-optimization program which includes automated exposure control, adjustment of the mA and/or kV according to patient size and/or use of iterative reconstruction technique. CONTRAST:  50m OMNIPAQUE IOHEXOL 350 MG/ML SOLN COMPARISON:  None Available. FINDINGS: Pharynx and larynx: Negative larynx and  epiglottis. Symmetric mild palatine tonsil and adenoid hypertrophy. Mild inflammation in the right parapharyngeal space. But no tonsillar abscess identified. Left parapharyngeal and retropharyngeal spaces remain within normal limits. Salivary glands: Evidence of inflammation in the lower right masticator space. Inflammation in the right submandibular space. Sublingual space is spared. Left-side submandibular and masticator spaces appear normal. Bilateral parotid glands are within normal limits. Thyroid: Negative. Lymph nodes: Small but reactive appearing level 1 lymph nodes in the 1 a station and right 1 B station. Similar small but reactive appearing right level 2 and level 3 lymph nodes up to 11 mm short axis series 3, image 60. No cystic or necrotic lymph nodes. Vascular: Major vascular structures in the neck and at the skull base appear to be patent including the right IJ. Limited intracranial: Negative. Visualized orbits: Negative. Mastoids and visualized paranasal sinuses: Clear aside from minimal bilateral maxillary alveolar recess mucosal thickening. Skeleton: Carious right maxillary posterior molar, right mandible wisdom tooth. Both demonstrate mild periapical lucency. Right maxillary trace dehiscence on series 4, image 37. But regional soft tissue inflammation appears more concentrated at the mandible  wisdom tooth. Subperiosteal inflammation there although no obvious dehiscence or abscess at this time. Contralateral carious left maxillary wisdom tooth with a greater degree of periapical lucency. Chronic disc and endplate degeneration in the lower cervical spine. Congenital spina bifida occulta C7 and T1. No other acute osseous abnormality. Upper chest: Negative lung apices. IMPRESSION: Odontogenic inflammation suspected in the right neck at the lower righ masticator space, right parapharyngeal and submandibular spaces. Suspect this is related to carious right mandible wisdom tooth, although nearby carious right maxillary posterior molar also. Reactive lymphadenopathy, but no abscess or other complicating features. Electronically Signed   By: Genevie Ann M.D.   On: 11/20/2021 05:02     Assessment & Plan:   Type 2 diabetes mellitus with complication, with long-term current use of insulin (HCC) - POCT glycosylated hemoglobin (Hb A1C)  2. Dental infection  - CBC  Follow up:  Follow up as scheduled     Fenton Foy, NP 12/09/2021

## 2021-12-09 NOTE — Patient Instructions (Signed)
1. Type 2 diabetes mellitus with complication, with long-term current use of insulin (HCC)  - POCT glycosylated hemoglobin (Hb A1C)  2. Dental infection  - CBC  Follow up:  Follow up as scheduled

## 2021-12-09 NOTE — Assessment & Plan Note (Signed)
-   POCT glycosylated hemoglobin (Hb A1C)  2. Dental infection  - CBC  Follow up:  Follow up as scheduled

## 2021-12-10 LAB — CBC
Hematocrit: 43.3 % (ref 37.5–51.0)
Hemoglobin: 13.7 g/dL (ref 13.0–17.7)
MCH: 24.9 pg — ABNORMAL LOW (ref 26.6–33.0)
MCHC: 31.6 g/dL (ref 31.5–35.7)
MCV: 79 fL (ref 79–97)
Platelets: 200 10*3/uL (ref 150–450)
RBC: 5.5 x10E6/uL (ref 4.14–5.80)
RDW: 14.2 % (ref 11.6–15.4)
WBC: 7.9 10*3/uL (ref 3.4–10.8)

## 2021-12-17 ENCOUNTER — Other Ambulatory Visit: Payer: Self-pay

## 2021-12-17 ENCOUNTER — Other Ambulatory Visit (HOSPITAL_COMMUNITY): Payer: Self-pay

## 2021-12-17 ENCOUNTER — Other Ambulatory Visit: Payer: Self-pay | Admitting: Internal Medicine

## 2021-12-17 MED ORDER — TADALAFIL 5 MG PO TABS
5.0000 mg | ORAL_TABLET | Freq: Every day | ORAL | 0 refills | Status: DC
  Filled 2021-12-17: qty 30, 30d supply, fill #0
  Filled 2022-01-14: qty 19, 19d supply, fill #0
  Filled 2022-02-05 – 2022-03-02 (×2): qty 11, 11d supply, fill #1

## 2021-12-24 ENCOUNTER — Other Ambulatory Visit (HOSPITAL_COMMUNITY): Payer: Self-pay

## 2021-12-27 ENCOUNTER — Other Ambulatory Visit (HOSPITAL_COMMUNITY): Payer: Self-pay

## 2021-12-30 ENCOUNTER — Encounter (HOSPITAL_COMMUNITY): Payer: Self-pay

## 2021-12-30 ENCOUNTER — Other Ambulatory Visit (HOSPITAL_COMMUNITY): Payer: Self-pay

## 2022-01-01 ENCOUNTER — Other Ambulatory Visit (HOSPITAL_COMMUNITY): Payer: Self-pay

## 2022-01-14 ENCOUNTER — Other Ambulatory Visit (HOSPITAL_COMMUNITY): Payer: Self-pay

## 2022-01-21 ENCOUNTER — Other Ambulatory Visit: Payer: Self-pay | Admitting: Nurse Practitioner

## 2022-01-21 ENCOUNTER — Other Ambulatory Visit (HOSPITAL_COMMUNITY): Payer: Self-pay

## 2022-01-21 DIAGNOSIS — I10 Essential (primary) hypertension: Secondary | ICD-10-CM

## 2022-01-21 DIAGNOSIS — F5101 Primary insomnia: Secondary | ICD-10-CM

## 2022-02-05 ENCOUNTER — Other Ambulatory Visit (HOSPITAL_COMMUNITY): Payer: Self-pay

## 2022-02-08 ENCOUNTER — Ambulatory Visit (INDEPENDENT_AMBULATORY_CARE_PROVIDER_SITE_OTHER): Payer: 59 | Admitting: Family

## 2022-02-08 DIAGNOSIS — L97521 Non-pressure chronic ulcer of other part of left foot limited to breakdown of skin: Secondary | ICD-10-CM

## 2022-02-09 ENCOUNTER — Other Ambulatory Visit (HOSPITAL_COMMUNITY): Payer: Self-pay

## 2022-02-09 ENCOUNTER — Encounter: Payer: Self-pay | Admitting: Family

## 2022-02-09 NOTE — Progress Notes (Signed)
Office Visit Note   Patient: Jonathan [name]           Date of Birth: 15-Sep-1980           MRN: [id number] Visit Date: 02/08/2022              Requested by: Ivonne Andrew, NP [phone] [address], [address] [place],  [place] [address] PCP: Ivonne Andrew, NP  Chief Complaint  Patient presents with   Left Foot - Follow-up      HPI: The patient is a 41 year old gentleman seen for evaluation of worsening of left foot ulcer. This has worsened since his last visit.  He did go on a trip and was much more active on his feet he was not wearing his offloading pads in his shoe wear.  He has noticed drainage underneath the second metatarsal head on the left with pink drainage no significant pain.  Assessment & Plan: Visit Diagnoses: No diagnosis found.  Plan: This was part of nonviable tissue back to viable tissue he does have underlying ulcer he will begin his protective shoe wear given new felt pressure relieving pads today.  Daily dose of cleansing dry dressings.  Follow-Up Instructions: No follow-ups on file.   Ortho Exam  Patient is alert, oriented, no adenopathy, well-dressed, normal affect, normal respiratory effort. On examination of the left foot the great toe is surgically absent he does have a an ulcer beneath the second metatarsal head this is callus 3 cm in diameter there is central ulceration after debridement which is 3 cm in diameter 1 mm deep this does not probe to bone or tendon there is no surrounding erythema or warmth no purulence  Imaging: No results found. No images are attached to the encounter.  Labs: Lab Results  Component Value Date   HGBA1C 7.1 (A) 12/09/2021   HGBA1C 7.9 (A) 08/27/2021   HGBA1C 8.8 (A) 04/26/2021   ESRSEDRATE 5 01/30/2018   ESRSEDRATE 23 (H) 12/26/2016   ESRSEDRATE 1 12/05/2016   CRP <0.8 01/30/2018   CRP 5.8 (H) 12/26/2016   CRP <0.8 12/05/2016   REPTSTATUS 02/04/2018 FINAL 01/30/2018   GRAMSTAIN  12/26/2016    FEW WBC PRESENT,  PREDOMINANTLY PMN ABUNDANT GRAM POSITIVE COCCI IN PAIRS ABUNDANT GRAM NEGATIVE RODS Performed at [facility], [address] [place], [place] [address]    CULT  01/30/2018    NO GROWTH 5 DAYS Performed at [facility], [address], [place], [place] [address]      Lab Results  Component Value Date   ALBUMIN 3.3 (L) 11/19/2021   ALBUMIN 4.2 07/07/2021   ALBUMIN 4.0 10/21/2020    No results found for: "MG" Lab Results  Component Value Date   VD25OH 18.9 (L) 05/26/2020   VD25OH 8.8 (L) 04/29/2019    No results found for: "PREALBUMIN"    Latest Ref Rng & Units 12/09/2021    2:02 PM 11/19/2021   10:50 PM 07/07/2021   10:52 AM  CBC EXTENDED  WBC 3.4 - 10.8 x10E3/uL 7.9  11.2  5.0   RBC 4.14 - 5.80 x10E6/uL 5.50  5.43  6.23   Hemoglobin 13.0 - 17.7 g/dL 73.5  32.9  92.4   HCT 37.5 - 51.0 % 43.3  43.1  48.5   Platelets 150 - 450 x10E3/uL 200  142  167   NEUT# 1.7 - 7.7 K/uL  8.4    Lymph# 0.7 - 4.0 K/uL  1.7  There is no height or weight on file to calculate BMI.  Orders:  No orders of the defined types were placed in this encounter.  No orders of the defined types were placed in this encounter.    Procedures: No procedures performed  Clinical Data: No additional findings.  ROS:  All other systems negative, except as noted in the HPI. Review of Systems  Objective: Vital Signs: There were no vitals taken for this visit.  Specialty Comments:  No specialty comments available.  PMFS History: Patient Active Problem List   Diagnosis Date Noted   Type 2 diabetes mellitus with diabetic polyneuropathy, with long-term current use of insulin (HCC) 08/27/2021   Hypogonadotropic hypogonadism (HCC) 07/17/2019   Hemoglobin A1C between 7% and 9% indicating borderline diabetic control (HCC) 04/29/2019   Decreased libido 04/29/2019   Low testosterone in male 04/2019   Non-pressure chronic ulcer of other part of left foot with necrosis of bone (HCC)     Cutaneous abscess of left foot    Hemoglobin A1C greater than 9%, indicating poor diabetic control 08/27/2018   Amputation of left great toe (HCC) 08/27/2018   Osteomyelitis of great toe of left foot (HCC) 01/30/2018   Osteomyelitis (HCC) 01/30/2018   Depression with anxiety 01/30/2018   Major depressive disorder, recurrent severe without psychotic features (HCC) 07/16/2017   OD (overdose of drug), intentional self-harm, initial encounter (HCC) 07/15/2017   Cellulitis 12/26/2016   Open toe wound, subsequent encounter 12/12/2016   Hyperglycemia 12/06/2016   Diabetic ulcer of left foot (HCC) 12/05/2016   Diabetic neuropathy (HCC) 10/16/2015   Erectile dysfunction 08/05/2015   Low testosterone 09/12/2014   Type 2 diabetes mellitus with complication, with long-term current use of insulin (HCC) 06/12/2014   Diabetes mellitus, type II (HCC) 03/23/2012   Noncompliance 03/23/2012   Gastroenteritis and colitis, viral 03/23/2012   Past Medical History:  Diagnosis Date   Amputation of left great toe (HCC) 01/2019   Anxiety    Decreased libido 04/2019   Diabetes mellitus    Erectile dysfunction 04/2019   Hyperglycemia 09/2019   Low testosterone in male 04/2019   Proteinuria 01/07/2020   Vitamin D deficiency 04/2019    Family History  Problem Relation Age of Onset   Other Mother        Healthy   Other Father        Healthy    Past Surgical History:  Procedure Laterality Date   AMPUTATION Left 02/02/2018   Procedure: LEFT GREAT TOE AMPUTATION;  Surgeon: Nadara Mustard, MD;  Location: [facility] OR;  Service: Orthopedics;  Laterality: Left;   AMPUTATION Left 02/08/2019   Procedure: LEFT FOOT 1ST RAY AMPUTATION;  Surgeon: Nadara Mustard, MD;  Location: [facility] OR;  Service: Orthopedics;  Laterality: Left;   Social History   Occupational History   Not on file  Tobacco Use   Smoking status: Never   Smokeless tobacco: Never  Vaping Use   Vaping Use: Never used  Substance and Sexual Activity    Alcohol use: Yes    Alcohol/week: 2.0 standard drinks of alcohol    Types: 1 Glasses of wine, 1 Cans of beer per week    Comment: occassional   Drug use: No   Sexual activity: Yes

## 2022-02-22 ENCOUNTER — Other Ambulatory Visit (HOSPITAL_COMMUNITY): Payer: Self-pay

## 2022-03-01 NOTE — Progress Notes (Unsigned)
Name: Jonathan [name]  Age/ Sex: 42 y.o., male   MRN/ DOB: [id number], Jan 02, 1981     PCP: Fenton Foy, NP   Reason for Endocrinology Evaluation: Type 2 Diabetes Mellitus  Initial Endocrine Consultative Visit: 07/15/2019    PATIENT IDENTIFIER: Jonathan [name] is a 42 y.o. male with a past medical history of T2DM, hypogonadism. The patient has followed with Endocrinology clinic since 07/15/2019 for consultative assistance with management of his diabetes.  DIABETIC HISTORY:  Jonathan [name] was diagnosed with DM 2014.  He has been on basal insulin, insulin mix, and Victoza in the past.. His hemoglobin A1c has ranged from 9.8% in 2021, peaking at >15.5% in 2019  He is intolerant to metformin due to diarrhea Wilder Glade was started August 2022   HYPOGONADISM HISTORY:  He was first diagnosed with hypogonadism in 2016.  He has had multiple testosterone readings with a nadir of 125 NG/DL, as well as low sex hormone binding globulin.  He was on testosterone cypionate at some point   Upon his initial presentation to our clinic his testosterone was 167 NG/DL, with inappropriately normal FSH and LH at 3.4 and 7.54 uIU/mL respectively.  Normal prolactin, TFTs and cortisol  MRI 08/18/2019 did not show any pituitary pathology  He was prescribed clomiphene in July 2021 with improvement of his testosterone levels from 1 55-260 NG/DL  His treatment has been taken over by urology by February 2023  SUBJECTIVE:   During the last visit (08/27/2021): 7.9%    Today (03/02/2022): Jonathan [name] is here for follow-up on diabetes management.  He checks his blood sugars multiple times daily, through CGM. The patient has not had hypoglycemic episodes since the last clinic visit  He was seen by podiatry for follow-up on the left foot ulcer 02/08/2022  Denies nausea, vomiting or diarrhea   He takes humalog once a day in the morning at 15 units without using scale the rest of the day    HOME DIABETES  REGIMEN:  Toujeo  66 units daily Humalog 12 units 3 times daily before every meal Ozempic 2 mg weekly (Monday) [name] 25 mg daily Correction factor: Humalog (BG -130/20)      Statin: Yes ACE-I/ARB: no    CONTINUOUS GLUCOSE MONITORING RECORD INTERPRETATION    Dates of Recording:02/17/2022-03/02/2022  Sensor description: dexcom  Results statistics:   CGM use % of time 70  Average and SD 144/23.7  Time in range   85  %  % Time Above 180 15  % Time above 250 0  % Time Below target <1      Glycemic patterns summary: BG's optimal at night , variable hyperglycemia during the day   Hyperglycemic episodes  postprandial   Hypoglycemic episodes occurred after a bolus   Overnight periods: optimal       DIABETIC COMPLICATIONS: Microvascular complications:  Neuropathy Denies: CKD  Last Eye Exam: Completed 03/2021  Macrovascular complications:   Denies: CAD, CVA, PVD   HISTORY:  Past Medical History:  Past Medical History:  Diagnosis Date   Amputation of left great toe ([place]) 01/2019   Anxiety    Decreased libido 04/2019   Diabetes mellitus    Erectile dysfunction 04/2019   Hyperglycemia 09/2019   Low testosterone in male 04/2019   Proteinuria 01/07/2020   Vitamin D deficiency 04/2019   Past Surgical History:  Past Surgical History:  Procedure Laterality Date   AMPUTATION Left 02/02/2018   Procedure: LEFT GREAT TOE AMPUTATION;  Surgeon: Newt Minion, MD;  Location: [facility];  Service: Orthopedics;  Laterality: Left;   AMPUTATION Left 02/08/2019   Procedure: LEFT FOOT 1ST RAY AMPUTATION;  Surgeon: Newt Minion, MD;  Location: [facility];  Service: Orthopedics;  Laterality: Left;   Social History:  reports that he has never smoked. He has never used smokeless tobacco. He reports current alcohol use of about 2.0 standard drinks of alcohol per week. He reports that he does not use drugs. Family History:  Family History  Problem Relation Age of Onset   Other  Mother        Healthy   Other Father        Healthy     HOME MEDICATIONS: Allergies as of 03/02/2022   No Known Allergies      Medication List        Accurate as of March 02, 2022 10:36 AM. If you have any questions, ask your nurse or doctor.          amLODipine 5 MG tablet Commonly known as: NORVASC Take 1 tablet (5 mg total) by mouth daily.   atorvastatin 10 MG tablet Commonly known as: LIPITOR Take 1 tablet (10 mg total) by mouth daily.   blood glucose meter kit and supplies Dispense based on patient and insurance preference. Use up to four times daily as directed. (FOR ICD-10 E10.9, E11.9).   Clomid 50 MG tablet Generic drug: clomiPHENE Take 1 tablet (50 mg total) by mouth daily.   Clomid 50 MG tablet Generic drug: clomiPHENE Take 0.5 tablets (25 mg total) by mouth daily.   gabapentin 300 MG capsule Commonly known as: NEURONTIN Take 1 capsule (300 mg total) by mouth at bedtime.   HumaLOG KwikPen 100 UNIT/ML KwikPen Generic drug: insulin lispro Max daily 60 units   hydrOXYzine 10 MG tablet Commonly known as: ATARAX Take 1 tablet (10 mg total) by mouth at bedtime as needed.   ibuprofen 800 MG tablet Commonly known as: ADVIL Take 1 tablet (800 mg total) by mouth every 8 (eight) hours as needed.   [name] 25 MG Tabs tablet Generic drug: empagliflozin Take 1 tablet (25 mg total) by mouth daily before breakfast.   onetouch ultrasoft lancets Use 4 times daily as directed  E11.42, Z79.4   OneTouch Verio test strip Generic drug: glucose blood USE AS DIRECTED   OneTouch Verio w/Device Kit Inject 1 Units into the skin QID. Use meter 4 times daily as directed   oxyCODONE-acetaminophen 5-325 MG tablet Commonly known as: Percocet Take 1-2 tablets by mouth every 4 (four) hours as needed.   Ozempic (2 MG/DOSE) 8 MG/3ML Sopn Generic drug: Semaglutide (2 MG/DOSE) Inject 2 mg into the skin once a week.   tadalafil 5 MG tablet Commonly known as:  CIALIS Take 1 tablet (5 mg total) by mouth daily.   Toujeo SoloStar 300 UNIT/ML Solostar Pen Generic drug: insulin glargine (1 Unit Dial) Inject 66 Units into the skin daily in the afternoon.         OBJECTIVE:   Vital Signs: BP 134/82 (BP Location: Left Arm, Patient Position: Sitting, Cuff Size: Large)   Pulse 99   Ht _0  (2.032 m)   Wt 288 lb (130.6 kg)   SpO2 100%   BMI 31.64 kg/m   Wt Readings from Last 3 Encounters:  03/02/22 288 lb (130.6 kg)  12/09/21 291 lb (132 kg)  08/27/21 (!) 301 lb (136.5 kg)     Exam: General: Pt appears well and is in NAD  Lungs: Clear  with good BS bilat with no rales, rhonchi, or wheezes  Heart: RRR   Abdomen: soft, nontender  Extremities: No pretibial edema.   Neuro: MS is good with appropriate affect, pt is alert and Ox3    DM foot exam: 08/27/2021  The skin of the feet is without sores or ulcerations, S/P left great toe amputations  The pedal pulses are 1+ on right and 1+ on left. The sensation is decreased to a screening 5.07, 10 gram monofilament bilaterally        DATA REVIEWED:  Lab Results  Component Value Date   HGBA1C 6.8 (A) 03/02/2022   HGBA1C 7.1 (A) 12/09/2021   HGBA1C 7.9 (A) 08/27/2021    Latest Reference Range & Units 07/07/21 10:52  COMPREHENSIVE METABOLIC PANEL  Rpt !  Sodium 134 - 144 mmol/L 141  Potassium 3.5 - 5.2 mmol/L 4.8  Chloride 96 - 106 mmol/L 104  CO2 20 - 29 mmol/L 22  Glucose 70 - 99 mg/dL 186 (H)  BUN 6 - 20 mg/dL 16  Creatinine 0.76 - 1.27 mg/dL 1.30 (H)  Calcium 8.7 - 10.2 mg/dL 10.3 (H)  BUN/Creatinine Ratio 9 - 20  12  eGFR >59 mL/min/1.73 72  Alkaline Phosphatase 44 - 121 IU/L 81  Albumin 4.0 - 5.0 g/dL 4.2  Albumin/Globulin Ratio 1.2 - 2.2  1.4  AST 0 - 40 IU/L 34  ALT 0 - 44 IU/L 39  Total Protein 6.0 - 8.5 g/dL 7.2  Total Bilirubin 0.0 - 1.2 mg/dL <0.2  Total CHOL/HDL Ratio 0.0 - 5.0 ratio 3.8  Cholesterol, Total 100 - 199 mg/dL 143  HDL Cholesterol >39 mg/dL 38 (L)   Triglycerides 0 - 149 mg/dL 315 (H)  VLDL Cholesterol Cal 5 - 40 mg/dL 49 (H)  LDL Chol Calc (NIH) 0 - 99 mg/dL 56    ASSESSMENT / PLAN / RECOMMENDATIONS:   1) Type 2 Diabetes Mellitus, Optimally controlled, With neuropathic  complications  - Most recent A1c of 6.8 %. Goal A1c < 7.0 %.     - Praised the pt on improved glycemic control  - A1c down from 7.9 % to 6.8%  - In reviewing CGM download, he has been noted with hypoglycemia after breakfast, the pt is not taking 12 units but rather 15 units regardless of glycose data.  - I have advised him to take Humalog ( before the meal) rather than after the meal and needs to take it with breakfast and supper as he has been noted with BG > 200 mg/dL  -No need to take humalog with lunch    MEDICATIONS: Continue Toujeo to 66 units daily Decrease Humalog 10 units with breakfast and Supper Continue Ozempic 2 mg weekly Continue [name] 25 mg daily Continue correction factor: Humalog (BG -130/20)  EDUCATION / INSTRUCTIONS: BG monitoring instructions: Patient is instructed to check his blood sugars 3 times a day, before meals . Call [facility] if: BG persistently < 70  I reviewed the Rule of 15 for the treatment of hypoglycemia in detail with the patient. Literature supplied.   2) Diabetic complications:  Eye: Does not have known diabetic retinopathy.  Neuro/ Feet: Does  have known diabetic peripheral neuropathy .  Renal: Patient does not have known baseline CKD. He   is not on an ACEI/ARB at present.      F/U in 6 months   Signed electronically by: Mack Guise, MD  [facility]  [facility] [address] [place]  [place], [place] [address] Phone: [phone] FAX: [phone]   CC: Fenton Foy, NP [facility] [address], [place] [address] Phone: [phone]  Fax: [phone]  Return to Endocrinology clinic as below: Future Appointments  Date Time  Provider [place]  03/11/2022  9:00 AM Suzan Slick, NP [facility] None  03/11/2022 10:00 AM Fenton Foy, NP [facility] None

## 2022-03-02 ENCOUNTER — Ambulatory Visit (INDEPENDENT_AMBULATORY_CARE_PROVIDER_SITE_OTHER): Payer: 59 | Admitting: Internal Medicine

## 2022-03-02 ENCOUNTER — Encounter: Payer: Self-pay | Admitting: Internal Medicine

## 2022-03-02 VITALS — BP 134/82 | HR 99 | Ht >= 80 in | Wt 288.0 lb

## 2022-03-02 DIAGNOSIS — E1129 Type 2 diabetes mellitus with other diabetic kidney complication: Secondary | ICD-10-CM

## 2022-03-02 DIAGNOSIS — E118 Type 2 diabetes mellitus with unspecified complications: Secondary | ICD-10-CM

## 2022-03-02 DIAGNOSIS — Z794 Long term (current) use of insulin: Secondary | ICD-10-CM | POA: Diagnosis not present

## 2022-03-02 DIAGNOSIS — E785 Hyperlipidemia, unspecified: Secondary | ICD-10-CM

## 2022-03-02 DIAGNOSIS — R809 Proteinuria, unspecified: Secondary | ICD-10-CM

## 2022-03-02 DIAGNOSIS — E1142 Type 2 diabetes mellitus with diabetic polyneuropathy: Secondary | ICD-10-CM

## 2022-03-02 LAB — MICROALBUMIN / CREATININE URINE RATIO
Creatinine,U: 104.2 mg/dL
Microalb Creat Ratio: 31.8 mg/g — ABNORMAL HIGH (ref 0.0–30.0)
Microalb, Ur: 33.2 mg/dL — ABNORMAL HIGH (ref 0.0–1.9)

## 2022-03-02 LAB — LIPID PANEL
Cholesterol: 106 mg/dL (ref 0–200)
HDL: 42.9 mg/dL (ref >39.00)
LDL Cholesterol: 39 mg/dL (ref 0–99)
NonHDL: 62.95
Total CHOL/HDL Ratio: 2
Triglycerides: 118 mg/dL (ref 0.0–149.0)
VLDL: 23.6 mg/dL (ref 0.0–40.0)

## 2022-03-02 LAB — BASIC METABOLIC PANEL
BUN: 14 mg/dL (ref 6–23)
CO2: 31 mEq/L (ref 19–32)
Calcium: 10.1 mg/dL (ref 8.4–10.5)
Chloride: 105 mEq/L (ref 96–112)
Creatinine, Ser: 1.17 mg/dL (ref 0.40–1.50)
GFR: 78.19 mL/min (ref >60.00)
Glucose, Bld: 97 mg/dL (ref 70–99)
Potassium: 4.4 mEq/L (ref 3.5–5.1)
Sodium: 143 mEq/L (ref 135–145)

## 2022-03-02 LAB — POCT GLYCOSYLATED HEMOGLOBIN (HGB A1C): Hemoglobin A1C: 6.8 % — AB (ref 4.0–5.6)

## 2022-03-02 NOTE — Patient Instructions (Addendum)
-   Continue Toujeo 66 units daily  - Take   Humalog 10 units with Breakfast and Supper - Continue Ozempic 2 mg weekly  - Continue [name] 25 mg, 1 tablet every morning  -Humalog correctional insulin: ADD extra units on insulin to your meal-time Humalog dose if your blood sugars are higher than 150. Use the scale below to help guide you:   Blood sugar before meal Number of units to inject  Less than 150 0 unit  151 -  170 1 units  171 -  190 2 units  191 -  210 3 units  211 -  230 4 units  231 -  250 5 units  251 -  270 6 units  271 -  290 7 units  291 -  310 8 units  311 - 330 9 units        HOW TO TREAT LOW BLOOD SUGARS (Blood sugar LESS THAN 70 MG/DL) Please follow the RULE OF 15 for the treatment of hypoglycemia treatment (when your (blood sugars are less than 70 mg/dL)   STEP 1: Take 15 grams of carbohydrates when your blood sugar is low, which includes:  3-4 GLUCOSE TABS  OR 3-4 OZ OF JUICE OR REGULAR SODA OR ONE TUBE OF GLUCOSE GEL    STEP 2: RECHECK blood sugar in 15 MINUTES STEP 3: If your blood sugar is still low at the 15 minute recheck --> then, go back to STEP 1 and treat AGAIN with another 15 grams of carbohydrates.

## 2022-03-03 ENCOUNTER — Other Ambulatory Visit: Payer: Self-pay

## 2022-03-03 ENCOUNTER — Encounter (HOSPITAL_COMMUNITY): Payer: Self-pay

## 2022-03-03 ENCOUNTER — Other Ambulatory Visit (HOSPITAL_COMMUNITY): Payer: Self-pay

## 2022-03-03 DIAGNOSIS — E785 Hyperlipidemia, unspecified: Secondary | ICD-10-CM | POA: Insufficient documentation

## 2022-03-03 MED ORDER — LOSARTAN POTASSIUM 25 MG PO TABS
25.0000 mg | ORAL_TABLET | Freq: Every day | ORAL | 3 refills | Status: DC
  Filled 2022-03-03: qty 30, 30d supply, fill #0
  Filled 2022-03-15 – 2022-03-28 (×2): qty 30, 30d supply, fill #1
  Filled 2022-04-27: qty 30, 30d supply, fill #2
  Filled 2022-05-31 – 2022-06-09 (×2): qty 30, 30d supply, fill #3
  Filled 2022-07-21: qty 30, 30d supply, fill #4

## 2022-03-04 ENCOUNTER — Ambulatory Visit: Payer: 59 | Admitting: Family

## 2022-03-07 ENCOUNTER — Other Ambulatory Visit (HOSPITAL_COMMUNITY): Payer: Self-pay

## 2022-03-08 ENCOUNTER — Other Ambulatory Visit (HOSPITAL_COMMUNITY): Payer: Self-pay

## 2022-03-08 MED ORDER — TADALAFIL 5 MG PO TABS
5.0000 mg | ORAL_TABLET | Freq: Every day | ORAL | 0 refills | Status: DC
  Filled 2022-03-08 – 2022-03-11 (×3): qty 30, 30d supply, fill #0

## 2022-03-09 ENCOUNTER — Other Ambulatory Visit (HOSPITAL_COMMUNITY): Payer: Self-pay

## 2022-03-09 ENCOUNTER — Other Ambulatory Visit: Payer: Self-pay

## 2022-03-10 ENCOUNTER — Other Ambulatory Visit: Payer: Self-pay

## 2022-03-11 ENCOUNTER — Other Ambulatory Visit (HOSPITAL_COMMUNITY): Payer: Self-pay

## 2022-03-11 ENCOUNTER — Ambulatory Visit (INDEPENDENT_AMBULATORY_CARE_PROVIDER_SITE_OTHER): Payer: 59 | Admitting: Family

## 2022-03-11 ENCOUNTER — Encounter: Payer: Self-pay | Admitting: Nurse Practitioner

## 2022-03-11 ENCOUNTER — Encounter: Payer: Self-pay | Admitting: Family

## 2022-03-11 ENCOUNTER — Other Ambulatory Visit: Payer: Self-pay

## 2022-03-11 ENCOUNTER — Ambulatory Visit (INDEPENDENT_AMBULATORY_CARE_PROVIDER_SITE_OTHER): Payer: 59 | Admitting: Nurse Practitioner

## 2022-03-11 VITALS — BP 139/88 | HR 96 | Temp 97.7°F | Ht >= 80 in | Wt 289.8 lb

## 2022-03-11 DIAGNOSIS — Z794 Long term (current) use of insulin: Secondary | ICD-10-CM

## 2022-03-11 DIAGNOSIS — E118 Type 2 diabetes mellitus with unspecified complications: Secondary | ICD-10-CM | POA: Diagnosis not present

## 2022-03-11 DIAGNOSIS — L97521 Non-pressure chronic ulcer of other part of left foot limited to breakdown of skin: Secondary | ICD-10-CM

## 2022-03-11 DIAGNOSIS — I10 Essential (primary) hypertension: Secondary | ICD-10-CM

## 2022-03-11 LAB — POCT GLYCOSYLATED HEMOGLOBIN (HGB A1C): Hemoglobin A1C: 6.9 % — AB (ref 4.0–5.6)

## 2022-03-11 NOTE — Progress Notes (Signed)
@Patient  ID: Jonathan [name], male    DOB: 08-08-1980, 42 y.o.   MRN: [id number]  Chief Complaint  Patient presents with   Hypertension    Follow up    Referring provider: Fenton Foy, NP   HPI  42 year old male with history of diabetes, hypertension, hyperlipidemia, left great toe amputation, low testosterone, erectile dysfunction, depression and anxiety.  Patient presents today for a follow-up visit.  He has been followed by endocrinology and did have lab work about a week ago.  Overall things have been going well for him he is compliant with medications.  He states that he is compliant with his blood pressure medications and atorvastatin.  Blood pressure stable in office today. Denies f/c/s, n/v/d, hemoptysis, PND, leg swelling Denies chest pain or edema     No Known Allergies  Immunization History  Administered Date(s) Administered   Influenza,inj,Quad PF,6+ Mos 01/08/2015, 02/16/2018, 04/29/2019, 01/07/2020, 01/07/2021   Moderna Sars-Covid-2 Vaccination 08/08/2019, 09/04/2020   Pneumococcal Conjugate-13 01/07/2020   Tdap 09/11/2014    Past Medical History:  Diagnosis Date   Amputation of left great toe ([place]) 01/2019   Anxiety    Decreased libido 04/2019   Diabetes mellitus    Erectile dysfunction 04/2019   Hyperglycemia 09/2019   Low testosterone in male 04/2019   Proteinuria 01/07/2020   Vitamin D deficiency 04/2019    Tobacco History: Social History   Tobacco Use  Smoking Status Never  Smokeless Tobacco Never   Counseling given: Not Answered   Outpatient Encounter Medications as of 03/11/2022  Medication Sig   amLODipine (NORVASC) 5 MG tablet Take 1 tablet (5 mg total) by mouth daily.   atorvastatin (LIPITOR) 10 MG tablet Take 1 tablet (10 mg total) by mouth daily.   blood glucose meter kit and supplies Dispense based on patient and insurance preference. Use up to four times daily as directed. (FOR ICD-10 E10.9, E11.9).   Blood Glucose  Monitoring Suppl (ONETOUCH VERIO) w/Device KIT Inject 1 Units into the skin QID. Use meter 4 times daily as directed   clomiPHENE (CLOMID) 50 MG tablet Take 0.5 tablets (25 mg total) by mouth daily.   empagliflozin ([name]) 25 MG TABS tablet Take 1 tablet (25 mg total) by mouth daily before breakfast.   glucose blood (ONETOUCH VERIO) test strip USE AS DIRECTED   hydrOXYzine (ATARAX) 10 MG tablet Take 1 tablet (10 mg total) by mouth at bedtime as needed.   ibuprofen (ADVIL) 800 MG tablet Take 1 tablet (800 mg total) by mouth every 8 (eight) hours as needed.   insulin glargine, 1 Unit Dial, (TOUJEO SOLOSTAR) 300 UNIT/ML Solostar Pen Inject 66 Units into the skin daily in the afternoon.   insulin lispro (HUMALOG KWIKPEN) 100 UNIT/ML KwikPen Max daily 60 units   Lancets (ONETOUCH ULTRASOFT) lancets Use 4 times daily as directed  E11.42, Z79.4   losartan (COZAAR) 25 MG tablet Take 1 tablet (25 mg total) by mouth daily.   oxyCODONE-acetaminophen (PERCOCET) 5-325 MG tablet Take 1-2 tablets by mouth every 4 (four) hours as needed.   Semaglutide, 2 MG/DOSE, (OZEMPIC, 2 MG/DOSE,) 8 MG/3ML SOPN Inject 2 mg into the skin once a week.   tadalafil (CIALIS) 5 MG tablet Take 1 tablet (5 mg total) by mouth daily.   [DISCONTINUED] clomiPHENE (CLOMID) 50 MG tablet Take 1 tablet (50 mg total) by mouth daily.   gabapentin (NEURONTIN) 300 MG capsule Take 1 capsule (300 mg total) by mouth at bedtime. (Patient not taking: Reported on 03/02/2022)  No facility-administered encounter medications on file as of 03/11/2022.     Review of Systems  Review of Systems  Constitutional: Negative.   HENT: Negative.    Cardiovascular: Negative.   Gastrointestinal: Negative.   Allergic/Immunologic: Negative.   Neurological: Negative.   Psychiatric/Behavioral: Negative.         Physical Exam  BP 139/88   Pulse 96   Temp 97.7 F (36.5 C)   Ht 6\' 8"  (2.032 m)   Wt 289 lb 12.8 oz (131.5 kg)   SpO2 100%   BMI 31.84  kg/m   Wt Readings from Last 5 Encounters:  03/11/22 289 lb 12.8 oz (131.5 kg)  03/02/22 288 lb (130.6 kg)  12/09/21 291 lb (132 kg)  08/27/21 (!) 301 lb (136.5 kg)  08/04/21 (!) 319 lb 3.2 oz (144.8 kg)     Physical Exam Vitals and nursing note reviewed.  Constitutional:      General: He is not in acute distress.    Appearance: He is well-developed.  Cardiovascular:     Rate and Rhythm: Normal rate and regular rhythm.  Pulmonary:     Effort: Pulmonary effort is normal.     Breath sounds: Normal breath sounds.  Skin:    General: Skin is warm and dry.  Neurological:     Mental Status: He is alert and oriented to person, place, and time.      Lab Results:  CBC    Component Value Date/Time   WBC 7.9 12/09/2021 1402   WBC 11.2 (H) 11/19/2021 2250   RBC 5.50 12/09/2021 1402   RBC 5.43 11/19/2021 2250   HGB 13.7 12/09/2021 1402   HCT 43.3 12/09/2021 1402   PLT 200 12/09/2021 1402   MCV 79 12/09/2021 1402   MCH 24.9 (L) 12/09/2021 1402   MCH 25.0 (L) 11/19/2021 2250   MCHC 31.6 12/09/2021 1402   MCHC 31.6 11/19/2021 2250   RDW 14.2 12/09/2021 1402   LYMPHSABS 1.7 11/19/2021 2250   LYMPHSABS 2.3 05/26/2020 1419   MONOABS 0.9 11/19/2021 2250   EOSABS 0.1 11/19/2021 2250   EOSABS 0.1 05/26/2020 1419   BASOSABS 0.0 11/19/2021 2250   BASOSABS 0.0 05/26/2020 1419    BMET    Component Value Date/Time   NA 143 03/02/2022 1053   NA 141 07/07/2021 1052   K 4.4 03/02/2022 1053   CL 105 03/02/2022 1053   CO2 31 03/02/2022 1053   GLUCOSE 97 03/02/2022 1053   BUN 14 03/02/2022 1053   BUN 16 07/07/2021 1052   CREATININE 1.17 03/02/2022 1053   CREATININE 0.95 04/12/2016 1603   CALCIUM 10.1 03/02/2022 1053   GFRNONAA >60 11/19/2021 2250   GFRNONAA >89 04/12/2016 1603   GFRAA 131 04/29/2019 1342   GFRAA >89 04/12/2016 1603      Assessment & Plan:   Type 2 diabetes mellitus with complication, with long-term current use of insulin (HCC) - POCT glycosylated  hemoglobin (Hb A1C) - Ambulatory referral to Ophthalmology -continue to follow with endocrinology  2. Primary hypertension  - continue current medications  -low salt diet  -stay active  Follow up:  Follow up in 6 months     Fenton Foy, NP 03/11/2022

## 2022-03-11 NOTE — Patient Instructions (Signed)
1. Type 2 diabetes mellitus with complication, with long-term current use of insulin (HCC)  - POCT glycosylated hemoglobin (Hb A1C) - Ambulatory referral to Ophthalmology -continue to follow with endocrinology  2. Primary hypertension  - continue current medications  -low salt diet  -stay active  Follow up:  Follow up in 6 months

## 2022-03-11 NOTE — Assessment & Plan Note (Signed)
-  POCT glycosylated hemoglobin (Hb A1C) - Ambulatory referral to Ophthalmology -continue to follow with endocrinology  2. Primary hypertension  - continue current medications  -low salt diet  -stay active  Follow up:  Follow up in 6 months

## 2022-03-11 NOTE — Progress Notes (Signed)
Office Visit Note   Patient: Jonathan [name]           Date of Birth: Nov 11, 1980           MRN: [id number] Visit Date: 03/11/2022              Requested by: Fenton Foy, NP [phone] [address] [place],  [place] [address] PCP: Fenton Foy, NP  Chief Complaint  Patient presents with   Left Foot - Wound Check      HPI: The patient is a 42 year old gentleman seen in follow-up for ulcer of left foot ulcer.   The patient had gone on a trip had been on his feet much more than usual and was not wearing his protective shoe wear.    This has been improving greatly since his last visit over a month ago   Notes the ulcer has been dry.    Assessment & Plan: Visit Diagnoses: No diagnosis found.  Plan: This was pared of nonviable tissue back to viable tissue.  Remaining ulcerative area covered with eschar this is just 2 mm in diameter.   Continue protective shoe wear given new felt pressure relieving pads today.  Daily Dial soap cleansing dry dressings. Follow-up as needed Follow-Up Instructions: No follow-ups on file.   Ortho Exam  Patient is alert, oriented, no adenopathy, well-dressed, normal affect, normal respiratory effort. On examination of the left foot the great toe is surgically absent he does have a an ulcer beneath the second metatarsal head this is much improved.  There is a little nonviable tissue which was debrided with a 10 blade knife back to viable tissue there is a central ulceration which is just 2 mm in diameter covered with eschar there is no drainage or erythema    Imaging: No results found. No images are attached to the encounter.  Labs: Lab Results  Component Value Date   HGBA1C 6.8 (A) 03/02/2022   HGBA1C 7.1 (A) 12/09/2021   HGBA1C 7.9 (A) 08/27/2021   ESRSEDRATE 5 01/30/2018   ESRSEDRATE 23 (H) 12/26/2016   ESRSEDRATE 1 12/05/2016   CRP <0.8 01/30/2018   CRP 5.8 (H) 12/26/2016   CRP <0.8 12/05/2016   REPTSTATUS 02/04/2018 FINAL  01/30/2018   GRAMSTAIN  12/26/2016    FEW WBC PRESENT, PREDOMINANTLY PMN ABUNDANT GRAM POSITIVE COCCI IN PAIRS ABUNDANT GRAM NEGATIVE RODS Performed at [facility], [place] [address], [place], [place] [address]    CULT  01/30/2018    NO GROWTH 5 DAYS Performed at [facility] [address] [place], [place] [address]      Lab Results  Component Value Date   ALBUMIN 3.3 (L) 11/19/2021   ALBUMIN 4.2 07/07/2021   ALBUMIN 4.0 10/21/2020    No results found for: "MG" Lab Results  Component Value Date   VD25OH 18.9 (L) 05/26/2020   VD25OH 8.8 (L) 04/29/2019    No results found for: "PREALBUMIN"    Latest Ref Rng & Units 12/09/2021    2:02 PM 11/19/2021   10:50 PM 07/07/2021   10:52 AM  CBC EXTENDED  WBC 3.4 - 10.8 x10E3/uL 7.9  11.2  5.0   RBC 4.14 - 5.80 x10E6/uL 5.50  5.43  6.23   Hemoglobin 13.0 - 17.7 g/dL 13.7  13.6  15.2   HCT 37.5 - 51.0 % 43.3  43.1  48.5   Platelets 150 - 450 x10E3/uL 200  142  167   NEUT#  1.7 - 7.7 K/uL  8.4    Lymph# 0.7 - 4.0 K/uL  1.7       There is no height or weight on file to calculate BMI.  Orders:  No orders of the defined types were placed in this encounter.  No orders of the defined types were placed in this encounter.    Procedures: No procedures performed  Clinical Data: No additional findings.  ROS:  All other systems negative, except as noted in the HPI. Review of Systems  Objective: Vital Signs: There were no vitals taken for this visit.  Specialty Comments:  No specialty comments available.  PMFS History: Patient Active Problem List   Diagnosis Date Noted   Dyslipidemia 03/03/2022   Type 2 diabetes mellitus with diabetic polyneuropathy, with long-term current use of insulin ([place]) 08/27/2021   Hypogonadotropic hypogonadism ([place]) 07/17/2019   Hemoglobin A1C between 7% and 9% indicating borderline diabetic control ([place]) 04/29/2019   Decreased libido 04/29/2019   Low testosterone in male 04/2019    Non-pressure chronic ulcer of other part of left foot with necrosis of bone ([place])    Cutaneous abscess of left foot    Hemoglobin A1C greater than 9%, indicating poor diabetic control 08/27/2018   Amputation of left great toe ([place]) 08/27/2018   Osteomyelitis of great toe of left foot ([place]) 01/30/2018   Osteomyelitis ([place]) 01/30/2018   Depression with anxiety 01/30/2018   Major depressive disorder, recurrent severe without psychotic features ([place]) 07/16/2017   OD (overdose of drug), intentional self-harm, initial encounter ([place]) 07/15/2017   Cellulitis 12/26/2016   Open toe wound, subsequent encounter 12/12/2016   Hyperglycemia 12/06/2016   Diabetic ulcer of left foot ([place]) 12/05/2016   Diabetic neuropathy ([place]) 10/16/2015   Erectile dysfunction 08/05/2015   Low testosterone 09/12/2014   Type 2 diabetes mellitus with complication, with long-term current use of insulin ([place]) 06/12/2014   Diabetes mellitus, type II ([place]) 03/23/2012   Noncompliance 03/23/2012   Gastroenteritis and colitis, viral 03/23/2012   Past Medical History:  Diagnosis Date   Amputation of left great toe ([place]) 01/2019   Anxiety    Decreased libido 04/2019   Diabetes mellitus    Erectile dysfunction 04/2019   Hyperglycemia 09/2019   Low testosterone in male 04/2019   Proteinuria 01/07/2020   Vitamin D deficiency 04/2019    Family History  Problem Relation Age of Onset   Other Mother        Healthy   Other Father        Healthy    Past Surgical History:  Procedure Laterality Date   AMPUTATION Left 02/02/2018   Procedure: LEFT GREAT TOE AMPUTATION;  Surgeon: Newt Minion, MD;  Location: [facility];  Service: Orthopedics;  Laterality: Left;   AMPUTATION Left 02/08/2019   Procedure: LEFT FOOT 1ST RAY AMPUTATION;  Surgeon: Newt Minion, MD;  Location: [facility];  Service: Orthopedics;  Laterality: Left;   Social History   Occupational History   Not on file  Tobacco Use   Smoking status: Never   Smokeless  tobacco: Never  Vaping Use   Vaping Use: Never used  Substance and Sexual Activity   Alcohol use: Yes    Alcohol/week: 2.0 standard drinks of alcohol    Types: 1 Glasses of wine, 1 Cans of beer per week    Comment: occassional   Drug use: No   Sexual activity: Yes

## 2022-03-14 ENCOUNTER — Other Ambulatory Visit (HOSPITAL_COMMUNITY): Payer: Self-pay

## 2022-03-15 ENCOUNTER — Other Ambulatory Visit: Payer: Self-pay

## 2022-03-15 ENCOUNTER — Other Ambulatory Visit (HOSPITAL_COMMUNITY): Payer: Self-pay

## 2022-03-15 ENCOUNTER — Encounter: Payer: Self-pay | Admitting: Pharmacist

## 2022-03-15 ENCOUNTER — Other Ambulatory Visit: Payer: Self-pay | Admitting: Nurse Practitioner

## 2022-03-15 DIAGNOSIS — I10 Essential (primary) hypertension: Secondary | ICD-10-CM

## 2022-03-15 MED ORDER — AMLODIPINE BESYLATE 5 MG PO TABS
5.0000 mg | ORAL_TABLET | Freq: Every day | ORAL | 2 refills | Status: DC
  Filled 2022-03-15: qty 30, 30d supply, fill #0
  Filled 2022-04-03: qty 30, 30d supply, fill #1
  Filled 2022-05-10: qty 30, 30d supply, fill #2

## 2022-03-17 ENCOUNTER — Other Ambulatory Visit: Payer: Self-pay

## 2022-03-28 ENCOUNTER — Encounter: Payer: Self-pay | Admitting: Pharmacist

## 2022-03-28 ENCOUNTER — Other Ambulatory Visit (HOSPITAL_COMMUNITY): Payer: Self-pay

## 2022-03-28 ENCOUNTER — Other Ambulatory Visit: Payer: Self-pay

## 2022-03-29 ENCOUNTER — Other Ambulatory Visit (HOSPITAL_COMMUNITY): Payer: Self-pay

## 2022-04-01 ENCOUNTER — Other Ambulatory Visit: Payer: Self-pay

## 2022-04-03 ENCOUNTER — Other Ambulatory Visit (HOSPITAL_COMMUNITY): Payer: Self-pay

## 2022-04-03 ENCOUNTER — Other Ambulatory Visit: Payer: Self-pay | Admitting: Nurse Practitioner

## 2022-04-03 DIAGNOSIS — F5101 Primary insomnia: Secondary | ICD-10-CM

## 2022-04-04 ENCOUNTER — Other Ambulatory Visit (HOSPITAL_COMMUNITY): Payer: Self-pay

## 2022-04-04 MED ORDER — TADALAFIL 5 MG PO TABS
5.0000 mg | ORAL_TABLET | Freq: Every day | ORAL | 0 refills | Status: DC
  Filled 2022-04-04 – 2022-04-05 (×2): qty 30, 30d supply, fill #0

## 2022-04-05 ENCOUNTER — Other Ambulatory Visit: Payer: Self-pay

## 2022-04-05 ENCOUNTER — Other Ambulatory Visit (HOSPITAL_COMMUNITY): Payer: Self-pay

## 2022-04-05 NOTE — Telephone Encounter (Signed)
Caller & Relationship to patient:  MRN #  [id number]   Call Back Number:   Date of Last Office Visit: 03/15/2022     Date of Next Office Visit: 09/09/2022    Medication(s) to be Refilled:  see below  Preferred Pharmacy:  [facility]  ** Please notify patient to allow 48-72 hours to process** **Let patient know to contact pharmacy at the end of the day to make sure medication is ready. ** **If patient has not been seen in a year or longer, book an appointment **Advise to use MyChart for refill requests OR to contact their pharmacy

## 2022-04-06 ENCOUNTER — Other Ambulatory Visit (HOSPITAL_COMMUNITY): Payer: Self-pay

## 2022-04-06 ENCOUNTER — Other Ambulatory Visit: Payer: Self-pay

## 2022-04-06 MED ORDER — HYDROXYZINE HCL 10 MG PO TABS
10.0000 mg | ORAL_TABLET | Freq: Every evening | ORAL | 0 refills | Status: DC | PRN
  Filled 2022-04-06: qty 30, 30d supply, fill #0

## 2022-04-27 ENCOUNTER — Other Ambulatory Visit: Payer: Self-pay

## 2022-04-27 ENCOUNTER — Other Ambulatory Visit: Payer: Self-pay | Admitting: Internal Medicine

## 2022-04-27 MED ORDER — INSULIN LISPRO (1 UNIT DIAL) 100 UNIT/ML (KWIKPEN)
60.0000 [IU] | PEN_INJECTOR | Freq: Every day | SUBCUTANEOUS | 11 refills | Status: DC
  Filled 2022-04-27 – 2022-05-09 (×2): qty 45, 75d supply, fill #0

## 2022-04-28 ENCOUNTER — Encounter (HOSPITAL_COMMUNITY): Payer: Self-pay

## 2022-04-28 ENCOUNTER — Other Ambulatory Visit (HOSPITAL_COMMUNITY): Payer: Self-pay

## 2022-05-02 ENCOUNTER — Other Ambulatory Visit (HOSPITAL_COMMUNITY): Payer: Self-pay

## 2022-05-03 ENCOUNTER — Other Ambulatory Visit: Payer: Self-pay | Admitting: Nurse Practitioner

## 2022-05-04 ENCOUNTER — Other Ambulatory Visit: Payer: Self-pay

## 2022-05-06 ENCOUNTER — Other Ambulatory Visit: Payer: Self-pay

## 2022-05-06 ENCOUNTER — Other Ambulatory Visit (HOSPITAL_COMMUNITY): Payer: Self-pay

## 2022-05-06 MED ORDER — TADALAFIL 5 MG PO TABS
5.0000 mg | ORAL_TABLET | Freq: Every day | ORAL | 0 refills | Status: DC
  Filled 2022-05-06 – 2022-05-16 (×2): qty 30, 30d supply, fill #0

## 2022-05-09 ENCOUNTER — Other Ambulatory Visit: Payer: Self-pay

## 2022-05-09 ENCOUNTER — Other Ambulatory Visit (HOSPITAL_COMMUNITY): Payer: Self-pay

## 2022-05-09 MED ORDER — CLOMIPHENE CITRATE 50 MG PO TABS
25.0000 mg | ORAL_TABLET | Freq: Every day | ORAL | 3 refills | Status: DC
  Filled 2022-05-09: qty 15, 30d supply, fill #0
  Filled 2022-05-31 – 2023-03-31 (×11): qty 15, 30d supply, fill #1
  Filled 2023-04-05: qty 30, 60d supply, fill #1
  Filled 2023-04-05: qty 15, 30d supply, fill #1

## 2022-05-10 ENCOUNTER — Other Ambulatory Visit: Payer: Self-pay

## 2022-05-10 ENCOUNTER — Other Ambulatory Visit (HOSPITAL_COMMUNITY): Payer: Self-pay

## 2022-05-11 ENCOUNTER — Other Ambulatory Visit (HOSPITAL_COMMUNITY): Payer: Self-pay

## 2022-05-11 ENCOUNTER — Other Ambulatory Visit: Payer: Self-pay

## 2022-05-13 ENCOUNTER — Other Ambulatory Visit: Payer: Self-pay

## 2022-05-13 ENCOUNTER — Other Ambulatory Visit (HOSPITAL_COMMUNITY): Payer: Self-pay

## 2022-05-16 ENCOUNTER — Other Ambulatory Visit: Payer: Self-pay

## 2022-05-16 DIAGNOSIS — E118 Type 2 diabetes mellitus with unspecified complications: Secondary | ICD-10-CM

## 2022-05-16 MED ORDER — INSULIN LISPRO (1 UNIT DIAL) 100 UNIT/ML (KWIKPEN): 60.0000 [IU] | PEN_INJECTOR | Freq: Every day | SUBCUTANEOUS | 11 refills | Status: DC

## 2022-05-16 MED ORDER — EMPAGLIFLOZIN 25 MG PO TABS: 25.0000 mg | ORAL_TABLET | Freq: Every day | ORAL | 3 refills | Status: DC

## 2022-05-16 MED ORDER — TOUJEO SOLOSTAR 300 UNIT/ML ~~LOC~~ SOPN: 66.0000 [IU] | PEN_INJECTOR | Freq: Every day | SUBCUTANEOUS | 3 refills | Status: DC

## 2022-05-17 ENCOUNTER — Other Ambulatory Visit (HOSPITAL_COMMUNITY): Payer: Self-pay

## 2022-05-17 ENCOUNTER — Other Ambulatory Visit: Payer: Self-pay

## 2022-05-17 MED ORDER — INSULIN LISPRO (1 UNIT DIAL) 100 UNIT/ML (KWIKPEN): 10.0000 [IU] | PEN_INJECTOR | Freq: Two times a day (BID) | SUBCUTANEOUS | 3 refills | Status: DC

## 2022-05-17 NOTE — Addendum Note (Signed)
Addended by: Lauralyn Primes on: 05/17/2022 04:16 PM   Modules accepted: Orders

## 2022-05-20 ENCOUNTER — Other Ambulatory Visit: Payer: Self-pay

## 2022-05-20 DIAGNOSIS — Z794 Long term (current) use of insulin: Secondary | ICD-10-CM

## 2022-05-20 MED ORDER — INSULIN LISPRO (1 UNIT DIAL) 100 UNIT/ML (KWIKPEN): 10.0000 [IU] | PEN_INJECTOR | Freq: Two times a day (BID) | SUBCUTANEOUS | 3 refills | Status: DC

## 2022-05-31 ENCOUNTER — Other Ambulatory Visit (HOSPITAL_COMMUNITY): Payer: Self-pay

## 2022-05-31 ENCOUNTER — Other Ambulatory Visit: Payer: Self-pay

## 2022-05-31 ENCOUNTER — Other Ambulatory Visit: Payer: Self-pay | Admitting: Nurse Practitioner

## 2022-05-31 DIAGNOSIS — I10 Essential (primary) hypertension: Secondary | ICD-10-CM

## 2022-05-31 MED ORDER — AMLODIPINE BESYLATE 5 MG PO TABS
5.0000 mg | ORAL_TABLET | Freq: Every day | ORAL | 2 refills | Status: DC
  Filled 2022-05-31 – 2022-07-21 (×3): qty 30, 30d supply, fill #0

## 2022-06-01 ENCOUNTER — Other Ambulatory Visit: Payer: Self-pay

## 2022-06-02 ENCOUNTER — Encounter (HOSPITAL_COMMUNITY): Payer: Self-pay

## 2022-06-02 ENCOUNTER — Other Ambulatory Visit (HOSPITAL_COMMUNITY): Payer: Self-pay

## 2022-06-06 ENCOUNTER — Other Ambulatory Visit (HOSPITAL_COMMUNITY): Payer: Self-pay

## 2022-06-06 ENCOUNTER — Other Ambulatory Visit: Payer: Self-pay

## 2022-06-06 MED ORDER — TADALAFIL 5 MG PO TABS
5.0000 mg | ORAL_TABLET | Freq: Every day | ORAL | 0 refills | Status: DC
  Filled 2022-06-06: qty 30, 30d supply, fill #0

## 2022-06-07 ENCOUNTER — Other Ambulatory Visit: Payer: Self-pay

## 2022-06-09 ENCOUNTER — Other Ambulatory Visit (HOSPITAL_COMMUNITY): Payer: Self-pay

## 2022-06-09 ENCOUNTER — Other Ambulatory Visit: Payer: Self-pay

## 2022-06-15 ENCOUNTER — Encounter (HOSPITAL_COMMUNITY): Payer: Self-pay

## 2022-06-15 ENCOUNTER — Other Ambulatory Visit (HOSPITAL_COMMUNITY): Payer: Self-pay

## 2022-06-15 ENCOUNTER — Other Ambulatory Visit: Payer: Self-pay

## 2022-06-21 ENCOUNTER — Other Ambulatory Visit: Payer: Self-pay

## 2022-06-23 ENCOUNTER — Other Ambulatory Visit (HOSPITAL_COMMUNITY): Payer: Self-pay

## 2022-06-24 ENCOUNTER — Other Ambulatory Visit: Payer: Self-pay

## 2022-06-27 ENCOUNTER — Other Ambulatory Visit: Payer: Self-pay

## 2022-07-21 ENCOUNTER — Other Ambulatory Visit: Payer: Self-pay | Admitting: Internal Medicine

## 2022-07-21 ENCOUNTER — Other Ambulatory Visit: Payer: Self-pay

## 2022-07-21 ENCOUNTER — Other Ambulatory Visit (HOSPITAL_COMMUNITY): Payer: Self-pay

## 2022-07-21 MED ORDER — TADALAFIL 5 MG PO TABS
5.0000 mg | ORAL_TABLET | Freq: Every day | ORAL | 0 refills | Status: DC
  Filled 2022-07-21: qty 30, 30d supply, fill #0

## 2022-07-21 MED ORDER — OZEMPIC (2 MG/DOSE) 8 MG/3ML ~~LOC~~ SOPN
2.0000 mg | PEN_INJECTOR | SUBCUTANEOUS | 3 refills | Status: DC
  Filled 2022-07-21: qty 3, 28d supply, fill #0

## 2022-07-22 ENCOUNTER — Other Ambulatory Visit (HOSPITAL_COMMUNITY): Payer: Self-pay

## 2022-07-22 ENCOUNTER — Encounter (HOSPITAL_COMMUNITY): Payer: Self-pay

## 2022-07-22 ENCOUNTER — Other Ambulatory Visit: Payer: Self-pay

## 2022-07-27 ENCOUNTER — Other Ambulatory Visit: Payer: Self-pay

## 2022-07-27 ENCOUNTER — Other Ambulatory Visit (HOSPITAL_COMMUNITY): Payer: Self-pay

## 2022-08-15 ENCOUNTER — Other Ambulatory Visit: Payer: Self-pay

## 2022-08-15 DIAGNOSIS — I10 Essential (primary) hypertension: Secondary | ICD-10-CM

## 2022-08-15 MED ORDER — LOSARTAN POTASSIUM 25 MG PO TABS: 25.0000 mg | ORAL_TABLET | Freq: Every day | ORAL | 0 refills | Status: DC

## 2022-08-15 MED ORDER — AMLODIPINE BESYLATE 5 MG PO TABS
5.0000 mg | ORAL_TABLET | Freq: Every day | ORAL | 2 refills | Status: DC
Start: 2022-08-15 — End: 2022-10-24

## 2022-08-18 ENCOUNTER — Telehealth: Payer: Self-pay

## 2022-08-18 NOTE — Telephone Encounter (Signed)
Called to find out if he needed his atorvastatin sent to local pharmacy or [facility]. No answer lvm. KH

## 2022-08-25 ENCOUNTER — Telehealth: Payer: Self-pay | Admitting: Nurse Practitioner

## 2022-08-25 DIAGNOSIS — E118 Type 2 diabetes mellitus with unspecified complications: Secondary | ICD-10-CM

## 2022-08-25 MED ORDER — ATORVASTATIN CALCIUM 10 MG PO TABS: 10.0000 mg | ORAL_TABLET | Freq: Every day | ORAL | 11 refills | Status: DC

## 2022-08-25 NOTE — Telephone Encounter (Signed)
Medication sent in to pt pharmacy. Gh 

## 2022-08-25 NOTE — Telephone Encounter (Signed)
Caller & Relationship to patient:  MRN #  [id number]   Call Back Number:   Date of Last Office Visit: 08/18/2022     Date of Next Office Visit: 09/09/2022    Medication(s) to be Refilled: Atorvaststin 10 mg  Preferred Pharmacy: [facility]  ** Please notify patient to allow 48-72 hours to process** **Let patient know to contact pharmacy at the end of the day to make sure medication is ready. ** **If patient has not been seen in a year or longer, book an appointment **Advise to use MyChart for refill requests OR to contact their pharmacy

## 2022-08-30 ENCOUNTER — Other Ambulatory Visit: Payer: Self-pay

## 2022-08-30 ENCOUNTER — Encounter: Payer: Self-pay | Admitting: Family

## 2022-08-30 ENCOUNTER — Ambulatory Visit (INDEPENDENT_AMBULATORY_CARE_PROVIDER_SITE_OTHER): Payer: 59 | Admitting: Family

## 2022-08-30 DIAGNOSIS — E118 Type 2 diabetes mellitus with unspecified complications: Secondary | ICD-10-CM | POA: Diagnosis not present

## 2022-08-30 DIAGNOSIS — I83023 Varicose veins of left lower extremity with ulcer of ankle: Secondary | ICD-10-CM

## 2022-08-30 DIAGNOSIS — L97328 Non-pressure chronic ulcer of left ankle with other specified severity: Secondary | ICD-10-CM

## 2022-08-30 DIAGNOSIS — F5101 Primary insomnia: Secondary | ICD-10-CM

## 2022-08-30 DIAGNOSIS — Z794 Long term (current) use of insulin: Secondary | ICD-10-CM

## 2022-08-30 MED ORDER — OZEMPIC (2 MG/DOSE) 8 MG/3ML ~~LOC~~ SOPN: 2.0000 mg | PEN_INJECTOR | SUBCUTANEOUS | 1 refills | Status: DC

## 2022-08-30 NOTE — Telephone Encounter (Signed)
Please advise Kh 

## 2022-08-30 NOTE — Progress Notes (Signed)
Office Visit Note   Patient: Jonathan [name]           Date of Birth: [birth year]           MRN: [id number] Visit Date: 08/30/2022              Requested by: Ivonne Andrew, NP [phone] [address] [place],  [place] [address] PCP: Ivonne Andrew, NP  No chief complaint on file.     HPI: The patient is a 42 year old gentleman who is seen for evaluation of a new ulcer to his left lateral ankle he states that he attempts to walk for exercise if he walks longer than a mile he will experience swelling a little over a week ago he did considerable walking and developed swelling and a blister over the lateral ankle he states that he unroofed the blister himself and then developed this ulcer which has not improved since denies fever or chills does complain of some tenderness to the ulcer however he also has burning neuropathic pain to bilateral feet.  Is status post great toe amputation on the left  Assessment & Plan: Visit Diagnoses:  1. Type 2 diabetes mellitus with complication, with long-term current use of insulin (HCC)   2. Venous stasis ulcer of left ankle with other ulcer severity, unspecified whether varicose veins present (HCC)     Plan: Given Iodosorb today for dressing changes discussed compression and strict control of his blood sugars.  Will also refer to vascular for evaluation.   Follow-Up Instructions: No follow-ups on file.   Ortho Exam  Patient is alert, oriented, no adenopathy, well-dressed, normal affect, normal respiratory effort. On examination of the left lower extremity there is trace edema he has an ankle ulcer over the lateral malleolus which is 1 cm in diameter 2 mm deep filled in with fibrinous exudative tissue there is no surrounding erythema mild tenderness there is no drainage he has a strong dorsalis pedis pulse to palpation  Imaging: No results found. No images are attached to the encounter.  Labs: Lab Results  Component Value Date   HGBA1C  6.9 (A) 03/11/2022   HGBA1C 6.8 (A) 03/02/2022   HGBA1C 7.1 (A) 12/09/2021   ESRSEDRATE 5 01/30/2018   ESRSEDRATE 23 (H) 12/26/2016   ESRSEDRATE 1 12/05/2016   CRP <0.8 01/30/2018   CRP 5.8 (H) 12/26/2016   CRP <0.8 12/05/2016   REPTSTATUS 02/04/2018 FINAL 01/30/2018   GRAMSTAIN  12/26/2016    FEW WBC PRESENT, PREDOMINANTLY PMN ABUNDANT GRAM POSITIVE COCCI IN PAIRS ABUNDANT GRAM NEGATIVE RODS Performed at [facility], [address], [place], [place] [address]    CULT  01/30/2018    NO GROWTH 5 DAYS Performed at [facility], [address], [place], [place] [address]      Lab Results  Component Value Date   ALBUMIN 3.3 (L) 11/19/2021   ALBUMIN 4.2 07/07/2021   ALBUMIN 4.0 10/21/2020    No results found for: "MG" Lab Results  Component Value Date   VD25OH 18.9 (L) 05/26/2020   VD25OH 8.8 (L) 04/29/2019    No results found for: "PREALBUMIN"    Latest Ref Rng & Units 12/09/2021    2:02 PM 11/19/2021   10:50 PM 07/07/2021   10:52 AM  CBC EXTENDED  WBC 3.4 - 10.8 x10E3/uL 7.9  11.2  5.0   RBC 4.14 - 5.80 x10E6/uL 5.50  5.43  6.23   Hemoglobin 13.0 - 17.7 g/dL 13.7  13.6  15.2   HCT 37.5 - 51.0 % 43.3  43.1  48.5   Platelets 150 - 450 x10E3/uL 200  142  167   NEUT# 1.7 - 7.7 K/uL  8.4    Lymph# 0.7 - 4.0 K/uL  1.7       There is no height or weight on file to calculate BMI.  Orders:  Orders Placed This Encounter  Procedures   Ambulatory referral to Vascular Surgery   No orders of the defined types were placed in this encounter.    Procedures: No procedures performed  Clinical Data: No additional findings.  ROS:  All other systems negative, except as noted in the HPI. Review of Systems  Objective: Vital Signs: There were no vitals taken for this visit.  Specialty Comments:  No specialty comments available.  PMFS History: Patient Active Problem List   Diagnosis Date Noted   Dyslipidemia 03/03/2022   Type 2 diabetes mellitus with  diabetic polyneuropathy, with long-term current use of insulin (HCC) 08/27/2021   Hypogonadotropic hypogonadism (HCC) 07/17/2019   Hemoglobin A1C between 7% and 9% indicating borderline diabetic control (HCC) 04/29/2019   Decreased libido 04/29/2019   Low testosterone in male 04/2019   Non-pressure chronic ulcer of other part of left foot with necrosis of bone (HCC)    Cutaneous abscess of left foot    Hemoglobin A1C greater than 9%, indicating poor diabetic control 08/27/2018   Amputation of left great toe (HCC) 08/27/2018   Osteomyelitis of great toe of left foot (HCC) 01/30/2018   Osteomyelitis (HCC) 01/30/2018   Depression with anxiety 01/30/2018   Major depressive disorder, recurrent severe without psychotic features (HCC) 07/16/2017   OD (overdose of drug), intentional self-harm, initial encounter (HCC) 07/15/2017   Cellulitis 12/26/2016   Open toe wound, subsequent encounter 12/12/2016   Hyperglycemia 12/06/2016   Diabetic ulcer of left foot (HCC) 12/05/2016   Diabetic neuropathy (HCC) 10/16/2015   Erectile dysfunction 08/05/2015   Low testosterone 09/12/2014   Type 2 diabetes mellitus with complication, with long-term current use of insulin (HCC) 06/12/2014   Diabetes mellitus, type II (HCC) 03/23/2012   Noncompliance 03/23/2012   Gastroenteritis and colitis, viral 03/23/2012   Past Medical History:  Diagnosis Date   Amputation of left great toe (HCC) 01/2019   Anxiety    Decreased libido 04/2019   Diabetes mellitus    Erectile dysfunction 04/2019   Hyperglycemia 09/2019   Low testosterone in male 04/2019   Proteinuria 01/07/2020   Vitamin D deficiency 04/2019    Family History  Problem Relation Age of Onset   Other Mother        Healthy   Other Father        Healthy    Past Surgical History:  Procedure Laterality Date   AMPUTATION Left 02/02/2018   Procedure: LEFT GREAT TOE AMPUTATION;  Surgeon: Nadara Mustard, MD;  Location: [facility] OR;  Service: Orthopedics;   Laterality: Left;   AMPUTATION Left 02/08/2019   Procedure: LEFT FOOT 1ST RAY AMPUTATION;  Surgeon: Nadara Mustard, MD;  Location: [facility] OR;  Service: Orthopedics;  Laterality: Left;   Social History   Occupational History   Not on file  Tobacco Use   Smoking status: Never   Smokeless tobacco: Never  Vaping Use   Vaping Use: Never used  Substance and Sexual Activity   Alcohol use: Yes    Alcohol/week: 2.0 standard drinks of alcohol    Types: 1 Glasses of wine, 1 Cans of  beer per week    Comment: occassional   Drug use: No   Sexual activity: Yes

## 2022-08-31 ENCOUNTER — Encounter: Payer: Self-pay | Admitting: Pharmacist

## 2022-08-31 ENCOUNTER — Other Ambulatory Visit: Payer: Self-pay

## 2022-08-31 MED ORDER — HYDROXYZINE HCL 10 MG PO TABS
10.0000 mg | ORAL_TABLET | Freq: Every evening | ORAL | 0 refills | Status: DC | PRN
Start: 2022-08-31 — End: 2022-09-16
  Filled 2022-08-31: qty 30, 30d supply, fill #0

## 2022-09-02 ENCOUNTER — Ambulatory Visit: Payer: 59 | Admitting: Family

## 2022-09-06 ENCOUNTER — Other Ambulatory Visit: Payer: Self-pay

## 2022-09-08 NOTE — Progress Notes (Signed)
Name: Jonathan [name]  Age/ Sex: 42 y.o., male   MRN/ DOB: [id number], May 21, 1980     PCP: Ivonne Andrew, NP   Reason for Endocrinology Evaluation: Type 2 Diabetes Mellitus  Initial Endocrine Consultative Visit: 07/15/2019    PATIENT IDENTIFIER: Jonathan [name] is a 42 y.o. male with a past medical history of T2DM, hypogonadism. The patient has followed with Endocrinology clinic since 07/15/2019 for consultative assistance with management of his diabetes.  DIABETIC HISTORY:  Mr. Bolar was diagnosed with DM 2014.  He has been on basal insulin, insulin mix, and Victoza in the past.. His hemoglobin A1c has ranged from 9.8% in 2021, peaking at >15.5% in 2019  He is intolerant to metformin due to diarrhea Marcelline Deist was started August 2022   HYPOGONADISM HISTORY:  He was first diagnosed with hypogonadism in 2016.  He has had multiple testosterone readings with a nadir of 125 NG/DL, as well as low sex hormone binding globulin.  He was on testosterone cypionate at some point   Upon his initial presentation to our clinic his testosterone was 167 NG/DL, with inappropriately normal FSH and LH at 3.4 and 7.54 uIU/mL respectively.  Normal prolactin, TFTs and cortisol  MRI 08/18/2019 did not show any pituitary pathology  He was prescribed clomiphene in July 2021 with improvement of his testosterone levels from 1 55-260 NG/DL  His treatment has been taken over by urology by February 2023  SUBJECTIVE:   During the last visit (03/02/2022): 6.8%    Today (09/09/2022): [name] is here for follow-up on diabetes management.  He checks his blood sugars multiple times daily, through CGM. The patient has not had hypoglycemic episodes since the last clinic visit    He continues to follow-up with orthopedics for a new ulcer to his left lateral ankle  Denies nausea, vomiting  Denies constipation or diarrhea    HOME DIABETES REGIMEN:  Toujeo  66 units daily Humalog 10 units with  breakfast and Supper Ozempic 2 mg weekly (Monday) [name] 25 mg daily Correction factor: Humalog (BG -130/20) Losartan 25 mg daily     Statin: Yes ACE-I/ARB: no    CONTINUOUS GLUCOSE MONITORING RECORD INTERPRETATION    Dates of Recording:6/23-09/03/2022  Sensor description: dexcom  Results statistics:   CGM use % of time 91  Average and SD 121/28.1  Time in range  91  %  % Time Above 180 6  % Time above 250 0  % Time Below target 3      Glycemic patterns summary: BG's optimal at night ,  Hyperglycemic episodes  postprandial   Hypoglycemic episodes occurred during the night and day  Overnight periods: Trends down      DIABETIC COMPLICATIONS: Microvascular complications:  Neuropathy Denies: CKD  Last Eye Exam: Completed 08/30/2022  Macrovascular complications:   Denies: CAD, CVA, PVD   HISTORY:  Past Medical History:  Past Medical History:  Diagnosis Date   Amputation of left great toe (HCC) 01/2019   Anxiety    Decreased libido 04/2019   Diabetes mellitus    Erectile dysfunction 04/2019   Hyperglycemia 09/2019   Low testosterone in male 04/2019   Proteinuria 01/07/2020   Vitamin D deficiency 04/2019   Past Surgical History:  Past Surgical History:  Procedure Laterality Date   AMPUTATION Left 02/02/2018   Procedure: LEFT GREAT TOE AMPUTATION;  Surgeon: Nadara Mustard, MD;  Location[facility] OR;  Service: Orthopedics;  Laterality: Left;   AMPUTATION Left 02/08/2019   Procedure: LEFT FOOT  1ST RAY AMPUTATION;  Surgeon: Nadara Mustard, MD;  Location: [facility] OR;  Service: Orthopedics;  Laterality: Left;   Social History:  reports that he has never smoked. He has never used smokeless tobacco. He reports current alcohol use of about 2.0 standard drinks of alcohol per week. He reports that he does not use drugs. Family History:  Family History  Problem Relation Age of Onset   Other Mother        Healthy   Other Father        Healthy     HOME  MEDICATIONS: Allergies as of 09/09/2022   No Known Allergies      Medication List        Accurate as of September 09, 2022 10:45 AM. If you have any questions, ask your nurse or doctor.          amLODipine 5 MG tablet Commonly known as: NORVASC Take 1 tablet (5 mg total) by mouth daily.   atorvastatin 10 MG tablet Commonly known as: LIPITOR Take 1 tablet (10 mg total) by mouth daily.   blood glucose meter kit and supplies Dispense based on patient and insurance preference. Use up to four times daily as directed. (FOR ICD-10 E10.9, E11.9).   Clomid 50 MG tablet Generic drug: clomiPHENE Take 0.5 tablets (25 mg total) by mouth daily.   empagliflozin 25 MG Tabs tablet Commonly known as: [name] Take 1 tablet (25 mg total) by mouth daily before breakfast.   gabapentin 300 MG capsule Commonly known as: NEURONTIN Take 1 capsule (300 mg total) by mouth at bedtime.   hydrOXYzine 10 MG tablet Commonly known as: ATARAX Take 1 tablet (10 mg total) by mouth at bedtime as needed.   ibuprofen 800 MG tablet Commonly known as: ADVIL Take 1 tablet (800 mg total) by mouth every 8 (eight) hours as needed.   insulin lispro 100 UNIT/ML KwikPen Commonly known as: HumaLOG KwikPen Inject 10 Units into the skin 2 (two) times daily before a meal. 60 units daily max   losartan 25 MG tablet Commonly known as: COZAAR Take 1 tablet (25 mg total) by mouth daily.   omeprazole 20 MG capsule Commonly known as: PRILOSEC Take 1 capsule (20 mg total) by mouth daily. Started by: Tonya S Nichols   onetouch ultrasoft lancets Use 4 times daily as directed  E11.42, Z79.4   OneTouch Verio test strip Generic drug: glucose blood USE AS DIRECTED   OneTouch Verio w/Device Kit Inject 1 Units into the skin QID. Use meter 4 times daily as directed   oxyCODONE-acetaminophen 5-325 MG tablet Commonly known as: Percocet Take 1-2 tablets by mouth every 4 (four) hours as needed.   Ozempic (2 MG/DOSE) 8  MG/3ML Sopn Generic drug: Semaglutide (2 MG/DOSE) Inject 2 mg into the skin once a week.   tadalafil 5 MG tablet Commonly known as: CIALIS Take 1 tablet (5 mg total) by mouth daily.   Toujeo SoloStar 300 UNIT/ML Solostar Pen Generic drug: insulin glargine (1 Unit Dial) Inject 66 Units into the skin daily in the afternoon.         OBJECTIVE:   Vital Signs: BP 120/82 (BP Location: Left Arm, Patient Position: Sitting, Cuff Size: Large)   Pulse 74   Ht 6\' 8"  (2.032 m)   Wt 289 lb (131.1 kg)   SpO2 99%   BMI 31.75 kg/m   Wt Readings from Last 3 Encounters:  09/09/22 289 lb (131.1 kg)  09/09/22 289 lb 9.6 oz (131.4 kg)  03/11/22 289 lb 12.8 oz (131.5 kg)     Exam: General: Pt appears well and is in NAD  Lungs: Clear with good BS bilat with no rales, rhonchi, or wheezes  Heart: RRR   Abdomen: soft, nontender  Extremities: No pretibial edema.   Neuro: MS is good with appropriate affect, pt is alert and Ox3    DM foot exam: 08/30/2022  Per Ortho       DATA REVIEWED:  Lab Results  Component Value Date   HGBA1C 6.9 (A) 09/09/2022   HGBA1C 6.9 (A) 03/11/2022   HGBA1C 6.8 (A) 03/02/2022    Latest Reference Range & Units 03/02/22 10:53  Sodium 135 - 145 mEq/L 143  Potassium 3.5 - 5.1 mEq/L 4.4  Chloride 96 - 112 mEq/L 105  CO2 19 - 32 mEq/L 31  Glucose 70 - 99 mg/dL 97  BUN 6 - 23 mg/dL 14  Creatinine 0.86 - 5.78 mg/dL 4.69  Calcium 8.4 - 62.9 mg/dL 52.8  GFR >41.32 mL/min 78.19  Total CHOL/HDL Ratio  2  Cholesterol 0 - 200 mg/dL 440  HDL Cholesterol >10.27 mg/dL 25.36  LDL (calc) 0 - 99 mg/dL 39  MICROALB/CREAT RATIO 0.0 - 30.0 mg/g 31.8 (H)  NonHDL  62.95  Triglycerides 0.0 - 149.0 mg/dL 644.0  VLDL 0.0 - 34.7 mg/dL 42.5    Latest Reference Range & Units 03/02/22 10:53  Creatinine,U mg/dL 956.3  Microalb, Ur 0.0 - 1.9 mg/dL 87.5 (H)  MICROALB/CREAT RATIO 0.0 - 30.0 mg/g 31.8 (H)      ASSESSMENT / PLAN / RECOMMENDATIONS:   1) Type 2 Diabetes  Mellitus, Optimally controlled, With neuropathic  complications and microalbuminuria- Most recent A1c of 6.9 %. Goal A1c < 7.0 %.     -A1c at goal -Patient has been noted with hypoglycemia, I am not sure how accurate his CGM is, but will decrease basal insulin as below -Patient advised to try to confirm BG readings especially low BG readings with fingerstick if possible  MEDICATIONS: Decrease Toujeo to 54 units daily Continue Humalog 10 units with breakfast and Supper Continue Ozempic 2 mg weekly Continue [name] 25 mg daily Continue correction factor: Humalog (BG -130/20)  EDUCATION / INSTRUCTIONS: BG monitoring instructions: Patient is instructed to check his blood sugars 3 times a day, before meals . Call [place] [facility] if: BG persistently < 70  I reviewed the Rule of 15 for the treatment of hypoglycemia in detail with the patient. Literature supplied.   2) Diabetic complications:  Eye: Does not have known diabetic retinopathy.  Neuro/ Feet: Does  have known diabetic peripheral neuropathy .  Renal: Patient does not have known baseline CKD. He   is not on an ACEI/ARB at present.    3)Dyslipidemia   -Lipid panel at goal  Medication Continue atorvastatin 10 mg daily   4) Microalbuminuria:  -Elevated MA/CR ratio -Patient tolerating losartan   Medication Continue losartan 25 mg daily   5) Hypogonadism  -Will defer to urology, they have been managing clomiphene prescription since 03/2021  F/U in 6 months   Signed electronically by: Lyndle Herrlich, MD  [facility]  [facility] [address] [place] [place], [place] [address] Phone: [phone] FAX: [phone]   CC: Ivonne Andrew, NP [address] [place] [address] Phone: [phone]  Fax: [phone]  Return to Endocrinology clinic as below: Future Appointments  Date Time Provider Department Center  09/09/2022 11:00 AM Ivonne Andrew, NP  [facility] None  09/09/2022 12:10 PM Varetta Chavers, Konrad Dolores, MD [facility] None  09/16/2022  9:00 AM Adonis Huguenin, NP [facility] None  11/11/2022 12:30 PM [facility] 2 - [facility]  11/11/2022  1:30 PM [facility] 2 - [facility]  11/11/2022  2:40 PM Victorino Sparrow, MD [facility]  03/17/2023 11:00 AM Ivonne Andrew, NP [facility] None

## 2022-09-09 ENCOUNTER — Ambulatory Visit (INDEPENDENT_AMBULATORY_CARE_PROVIDER_SITE_OTHER): Payer: 59 | Admitting: Internal Medicine

## 2022-09-09 ENCOUNTER — Encounter: Payer: Self-pay | Admitting: Internal Medicine

## 2022-09-09 ENCOUNTER — Encounter: Payer: Self-pay | Admitting: Nurse Practitioner

## 2022-09-09 ENCOUNTER — Ambulatory Visit (INDEPENDENT_AMBULATORY_CARE_PROVIDER_SITE_OTHER): Payer: 59 | Admitting: Nurse Practitioner

## 2022-09-09 VITALS — BP 120/82 | HR 74 | Ht >= 80 in | Wt 289.0 lb

## 2022-09-09 VITALS — BP 132/89 | HR 94 | Ht >= 80 in | Wt 289.6 lb

## 2022-09-09 DIAGNOSIS — Z794 Long term (current) use of insulin: Secondary | ICD-10-CM | POA: Diagnosis not present

## 2022-09-09 DIAGNOSIS — E118 Type 2 diabetes mellitus with unspecified complications: Secondary | ICD-10-CM | POA: Diagnosis not present

## 2022-09-09 DIAGNOSIS — K219 Gastro-esophageal reflux disease without esophagitis: Secondary | ICD-10-CM | POA: Diagnosis not present

## 2022-09-09 DIAGNOSIS — E1142 Type 2 diabetes mellitus with diabetic polyneuropathy: Secondary | ICD-10-CM

## 2022-09-09 DIAGNOSIS — E785 Hyperlipidemia, unspecified: Secondary | ICD-10-CM | POA: Diagnosis not present

## 2022-09-09 DIAGNOSIS — I10 Essential (primary) hypertension: Secondary | ICD-10-CM

## 2022-09-09 DIAGNOSIS — E1129 Type 2 diabetes mellitus with other diabetic kidney complication: Secondary | ICD-10-CM

## 2022-09-09 DIAGNOSIS — R809 Proteinuria, unspecified: Secondary | ICD-10-CM

## 2022-09-09 LAB — POCT GLYCOSYLATED HEMOGLOBIN (HGB A1C): Hemoglobin A1C: 6.9 % — AB (ref 4.0–5.6)

## 2022-09-09 MED ORDER — INSULIN LISPRO (1 UNIT DIAL) 100 UNIT/ML (KWIKPEN)
PEN_INJECTOR | SUBCUTANEOUS | 6 refills | Status: DC
Start: 2022-09-09 — End: 2023-01-16

## 2022-09-09 MED ORDER — INSULIN PEN NEEDLE 32G X 4 MM MISC: 1.0000 | Freq: Four times a day (QID) | 3 refills | Status: AC

## 2022-09-09 MED ORDER — EMPAGLIFLOZIN 25 MG PO TABS: 25.0000 mg | ORAL_TABLET | Freq: Every day | ORAL | 3 refills | Status: DC

## 2022-09-09 MED ORDER — OMEPRAZOLE 20 MG PO CPDR
20.0000 mg | DELAYED_RELEASE_CAPSULE | Freq: Every day | ORAL | 3 refills | Status: DC
Start: 2022-09-09 — End: 2023-06-09

## 2022-09-09 MED ORDER — TOUJEO SOLOSTAR 300 UNIT/ML ~~LOC~~ SOPN: 54.0000 [IU] | PEN_INJECTOR | Freq: Every day | SUBCUTANEOUS | 3 refills | Status: DC

## 2022-09-09 MED ORDER — OZEMPIC (2 MG/DOSE) 8 MG/3ML ~~LOC~~ SOPN: 2.0000 mg | PEN_INJECTOR | SUBCUTANEOUS | 1 refills | Status: DC

## 2022-09-09 MED ORDER — TADALAFIL 5 MG PO TABS: 5.0000 mg | ORAL_TABLET | Freq: Every day | ORAL | 0 refills | Status: DC

## 2022-09-09 NOTE — Assessment & Plan Note (Signed)
-   POCT glycosylated hemoglobin (Hb A1C) - CBC - Comprehensive metabolic panel  2. Gastroesophageal reflux disease without esophagitis  - omeprazole (PRILOSEC) 20 MG capsule; Take 1 capsule (20 mg total) by mouth daily.  Dispense: 30 capsule; Refill: 3  3. Primary hypertension  - CBC - Comprehensive metabolic panel   Follow up:  Follow up in 6 months

## 2022-09-09 NOTE — Patient Instructions (Addendum)
1. Type 2 diabetes mellitus with complication, with long-term current use of insulin (HCC)  - POCT glycosylated hemoglobin (Hb A1C) - CBC - Comprehensive metabolic panel  2. Gastroesophageal reflux disease without esophagitis  - omeprazole (PRILOSEC) 20 MG capsule; Take 1 capsule (20 mg total) by mouth daily.  Dispense: 30 capsule; Refill: 3  3. Primary hypertension  - CBC - Comprehensive metabolic panel   Follow up:  Follow up in 6 months  Hypertension, Adult Hypertension is another name for high blood pressure. High blood pressure forces your heart to work harder to pump blood. This can cause problems over time. There are two numbers in a blood pressure reading. There is a top number (systolic) over a bottom number (diastolic). It is best to have a blood pressure that is below 120/80. What are the causes? The cause of this condition is not known. Some other conditions can lead to high blood pressure. What increases the risk? Some lifestyle factors can make you more likely to develop high blood pressure: Smoking. Not getting enough exercise or physical activity. Being overweight. Having too much fat, sugar, calories, or salt (sodium) in your diet. Drinking too much alcohol. Other risk factors include: Having any of these conditions: Heart disease. Diabetes. High cholesterol. Kidney disease. Obstructive sleep apnea. Having a family history of high blood pressure and high cholesterol. Age. The risk increases with age. Stress. What are the signs or symptoms? High blood pressure may not cause symptoms. Very high blood pressure (hypertensive crisis) may cause: Headache. Fast or uneven heartbeats (palpitations). Shortness of breath. Nosebleed. Vomiting or feeling like you may vomit (nauseous). Changes in how you see. Very bad chest pain. Feeling dizzy. Seizures. How is this treated? This condition is treated by making healthy lifestyle changes, such as: Eating  healthy foods. Exercising more. Drinking less alcohol. Your doctor may prescribe medicine if lifestyle changes do not help enough and if: Your top number is above 130. Your bottom number is above 80. Your personal target blood pressure may vary. Follow these instructions at home: Eating and drinking  If told, follow the DASH eating plan. To follow this plan: Fill one half of your plate at each meal with fruits and vegetables. Fill one fourth of your plate at each meal with whole grains. Whole grains include whole-wheat pasta, brown rice, and whole-grain bread. Eat or drink low-fat dairy products, such as skim milk or low-fat yogurt. Fill one fourth of your plate at each meal with low-fat (lean) proteins. Low-fat proteins include fish, chicken without skin, eggs, beans, and tofu. Avoid fatty meat, cured and processed meat, or chicken with skin. Avoid pre-made or processed food. Limit the amount of salt in your diet to less than 1,500 mg each day. Do not drink alcohol if: Your doctor tells you not to drink. You are pregnant, may be pregnant, or are planning to become pregnant. If you drink alcohol: Limit how much you have to: 0-1 drink a day for women. 0-2 drinks a day for men. Know how much alcohol is in your drink. In the [place] one drink equals one 12 oz bottle of beer (355 mL), one 5 oz glass of wine (148 mL), or one 1 oz glass of hard liquor (44 mL). Lifestyle  Work with your doctor to stay at a healthy weight or to lose weight. Ask your doctor what the best weight is for you. Get at least 30 minutes of exercise that causes your heart to beat faster (aerobic exercise) most days  of the week. This may include walking, swimming, or biking. Get at least 30 minutes of exercise that strengthens your muscles (resistance exercise) at least 3 days a week. This may include lifting weights or doing Pilates. Do not smoke or use any products that contain nicotine or tobacco. If you need help  quitting, ask your doctor. Check your blood pressure at home as told by your doctor. Keep all follow-up visits. Medicines Take over-the-counter and prescription medicines only as told by your doctor. Follow directions carefully. Do not skip doses of blood pressure medicine. The medicine does not work as well if you skip doses. Skipping doses also puts you at risk for problems. Ask your doctor about side effects or reactions to medicines that you should watch for. Contact a doctor if: You think you are having a reaction to the medicine you are taking. You have headaches that keep coming back. You feel dizzy. You have swelling in your ankles. You have trouble with your vision. Get help right away if: You get a very bad headache. You start to feel mixed up (confused). You feel weak or numb. You feel faint. You have very bad pain in your: Chest. Belly (abdomen). You vomit more than once. You have trouble breathing. These symptoms may be an emergency. Get help right away. Call 911. Do not wait to see if the symptoms will go away. Do not drive yourself to the hospital. Summary Hypertension is another name for high blood pressure. High blood pressure forces your heart to work harder to pump blood. For most people, a normal blood pressure is less than 120/80. Making healthy choices can help lower blood pressure. If your blood pressure does not get lower with healthy choices, you may need to take medicine. This information is not intended to replace advice given to you by your health care provider. Make sure you discuss any questions you have with your health care provider. Document Revised: 12/03/2020 Document Reviewed: 12/03/2020 Elsevier Patient Education  2024 [employer]   Food Choices for Gastroesophageal Reflux Disease, Adult When you have gastroesophageal reflux disease (GERD), the foods you eat and your eating habits are very important. Choosing the right foods can help ease  the discomfort of GERD. Consider working with a dietitian to help you make healthy food choices. What are tips for following this plan? Reading food labels Look for foods that are low in saturated fat. Foods that have less than 5% of daily value (DV) of fat and 0 g of trans fats may help with your symptoms. Cooking Cook foods using methods other than frying. This may include baking, steaming, grilling, or broiling. These are all methods that do not need a lot of fat for cooking. To add flavor, try to use herbs that are low in spice and acidity. Meal planning  Choose healthy foods that are low in fat, such as fruits, vegetables, whole grains, low-fat dairy products, lean meats, fish, and poultry. Eat frequent, small meals instead of three large meals each day. Eat your meals slowly, in a relaxed setting. Avoid bending over or lying down until 2-3 hours after eating. Limit high-fat foods such as fatty meats or fried foods. Limit your intake of fatty foods, such as oils, butter, and shortening. Avoid the following as told by your health care provider: Foods that cause symptoms. These may be different for different people. Keep a food diary to keep track of foods that cause symptoms. Alcohol. Drinking large amounts of liquid with meals. Eating  meals during the 2-3 hours before bed. Lifestyle Maintain a healthy weight. Ask your health care provider what weight is healthy for you. If you need to lose weight, work with your health care provider to do so safely. Exercise for at least 30 minutes on 5 or more days each week, or as told by your health care provider. Avoid wearing clothes that fit tightly around your waist and chest. Do not use any products that contain nicotine or tobacco. These products include cigarettes, chewing tobacco, and vaping devices, such as e-cigarettes. If you need help quitting, ask your health care provider. Sleep with the head of your bed raised. Use a wedge under the  mattress or blocks under the bed frame to raise the head of the bed. Chew sugar-free gum after mealtimes. What foods should I eat?  Eat a healthy, well-balanced diet of fruits, vegetables, whole grains, low-fat dairy products, lean meats, fish, and poultry. Each person is different. Foods that may trigger symptoms in one person may not trigger any symptoms in another person. Work with your health care provider to identify foods that are safe for you. The items listed above may not be a complete list of recommended foods and beverages. Contact a dietitian for more information. What foods should I avoid? Limiting some of these foods may help manage the symptoms of GERD. Everyone is different. Consult a dietitian or your health care provider to help you identify the exact foods to avoid, if any. Fruits Any fruits prepared with added fat. Any fruits that cause symptoms. For some people this may include citrus fruits, such as oranges, grapefruit, pineapple, and lemons. Vegetables Deep-fried vegetables. [place] fries. Any vegetables prepared with added fat. Any vegetables that cause symptoms. For some people, this may include tomatoes and tomato products, chili peppers, onions and garlic, and horseradish. Grains Pastries or quick breads with added fat. Meats and other proteins High-fat meats, such as fatty beef or pork, hot dogs, ribs, ham, sausage, salami, and bacon. Fried meat or protein, including fried fish and fried chicken. Nuts and nut butters, in large amounts. Dairy Whole milk and chocolate milk. Sour cream. Cream. Ice cream. Cream cheese. Milkshakes. Fats and oils Butter. Margarine. Shortening. Ghee. Beverages Coffee and tea, with or without caffeine. Carbonated beverages. Sodas. Energy drinks. Fruit juice made with acidic fruits, such as orange or grapefruit. Tomato juice. Alcoholic drinks. Sweets and desserts Chocolate and cocoa. Donuts. Seasonings and condiments Pepper. Peppermint and  spearmint. Added salt. Any condiments, herbs, or seasonings that cause symptoms. For some people, this may include curry, hot sauce, or vinegar-based salad dressings. The items listed above may not be a complete list of foods and beverages to avoid. Contact a dietitian for more information. Questions to ask your health care provider Diet and lifestyle changes are usually the first steps that are taken to manage symptoms of GERD. If diet and lifestyle changes do not improve your symptoms, talk with your health care provider about taking medicines. Where to find more information [employer]: aboutgerd.org Summary When you have gastroesophageal reflux disease (GERD), food and lifestyle choices may be very helpful in easing the discomfort of GERD. Eat frequent, small meals instead of three large meals each day. Eat your meals slowly, in a relaxed setting. Avoid bending over or lying down until 2-3 hours after eating. Limit high-fat foods such as fatty meats or fried foods. This information is not intended to replace advice given to you by your health care provider.  Make sure you discuss any questions you have with your health care provider. Document Revised: 08/26/2019 Document Reviewed: 08/26/2019 Elsevier Patient Education  2024 [employer].

## 2022-09-09 NOTE — Progress Notes (Signed)
@Patient  ID: Jonathan [name], male    DOB: [birth year], 42 y.o.   MRN: [id number]  Chief Complaint  Patient presents with   Follow-up    Referring provider: Ivonne Andrew, NP   HPI  42 year old male with history of diabetes, hypertension, hyperlipidemia, left great toe amputation, low testosterone, erectile dysfunction, depression and anxiety.   Patient presents today for a follow-up visit.  He has been followed by endocrinology for diabetes.  A1C in office today is 6.9. Overall things have been going well for him he is compliant with medications.  He states that he is compliant with his blood pressure medications and atorvastatin.  Blood pressure stable in office today.  Patient states that he has been having acid reflux lately.  He would like a prescription to help with this.  We will trial omeprazole.  Denies f/c/s, n/v/d, hemoptysis, PND, leg swelling Denies chest pain or edema      No Known Allergies  Immunization History  Administered Date(s) Administered   Influenza,inj,Quad PF,6+ Mos 01/08/2015, 02/16/2018, 04/29/2019, 01/07/2020, 01/07/2021   Moderna Sars-Covid-2 Vaccination 08/08/2019, 09/04/2020   Pneumococcal Conjugate-13 01/07/2020   Tdap 09/11/2014    Past Medical History:  Diagnosis Date   Amputation of left great toe (HCC) 01/2019   Anxiety    Decreased libido 04/2019   Diabetes mellitus    Erectile dysfunction 04/2019   Hyperglycemia 09/2019   Low testosterone in male 04/2019   Proteinuria 01/07/2020   Vitamin D deficiency 04/2019    Tobacco History: Social History   Tobacco Use  Smoking Status Never  Smokeless Tobacco Never   Counseling given: Not Answered   Outpatient Encounter Medications as of 09/09/2022  Medication Sig   amLODipine (NORVASC) 5 MG tablet Take 1 tablet (5 mg total) by mouth daily.   atorvastatin (LIPITOR) 10 MG tablet Take 1 tablet (10 mg total) by mouth daily.   blood glucose meter kit and supplies Dispense based on  patient and insurance preference. Use up to four times daily as directed. (FOR ICD-10 E10.9, E11.9).   Blood Glucose Monitoring Suppl (ONETOUCH VERIO) w/Device KIT Inject 1 Units into the skin QID. Use meter 4 times daily as directed   clomiPHENE (CLOMID) 50 MG tablet Take 0.5 tablets (25 mg total) by mouth daily.   empagliflozin ([name]) 25 MG TABS tablet Take 1 tablet (25 mg total) by mouth daily before breakfast.   gabapentin (NEURONTIN) 300 MG capsule Take 1 capsule (300 mg total) by mouth at bedtime.   hydrOXYzine (ATARAX) 10 MG tablet Take 1 tablet (10 mg total) by mouth at bedtime as needed.   ibuprofen (ADVIL) 800 MG tablet Take 1 tablet (800 mg total) by mouth every 8 (eight) hours as needed.   insulin glargine, 1 Unit Dial, (TOUJEO SOLOSTAR) 300 UNIT/ML Solostar Pen Inject 66 Units into the skin daily in the afternoon.   insulin lispro (HUMALOG KWIKPEN) 100 UNIT/ML KwikPen Inject 10 Units into the skin 2 (two) times daily before a meal. 60 units daily max   Lancets (ONETOUCH ULTRASOFT) lancets Use 4 times daily as directed  E11.42, Z79.4   losartan (COZAAR) 25 MG tablet Take 1 tablet (25 mg total) by mouth daily.   omeprazole (PRILOSEC) 20 MG capsule Take 1 capsule (20 mg total) by mouth daily.   ONETOUCH VERIO test strip USE AS DIRECTED   oxyCODONE-acetaminophen (PERCOCET) 5-325 MG tablet Take 1-2 tablets by mouth every 4 (four) hours as needed.   Semaglutide, 2 MG/DOSE, (OZEMPIC, 2 MG/DOSE,) 8  MG/3ML SOPN Inject 2 mg into the skin once a week.   [DISCONTINUED] tadalafil (CIALIS) 5 MG tablet Take 1 tablet (5 mg total) by mouth daily.   tadalafil (CIALIS) 5 MG tablet Take 1 tablet (5 mg total) by mouth daily.   No facility-administered encounter medications on file as of 09/09/2022.     Review of Systems  Review of Systems  Constitutional: Negative.   HENT: Negative.    Cardiovascular: Negative.   Gastrointestinal: Negative.        Reflux  Allergic/Immunologic: Negative.    Neurological: Negative.   Psychiatric/Behavioral: Negative.         Physical Exam  BP 132/89   Pulse 94   Ht 6\' 8"  (2.032 m)   Wt 289 lb 9.6 oz (131.4 kg)   SpO2 100%   BMI 31.81 kg/m   Wt Readings from Last 5 Encounters:  09/09/22 289 lb 9.6 oz (131.4 kg)  03/11/22 289 lb 12.8 oz (131.5 kg)  03/02/22 288 lb (130.6 kg)  12/09/21 291 lb (132 kg)  08/27/21 (!) 301 lb (136.5 kg)     Physical Exam Vitals and nursing note reviewed.  Constitutional:      General: He is not in acute distress.    Appearance: He is well-developed.  Cardiovascular:     Rate and Rhythm: Normal rate and regular rhythm.  Pulmonary:     Effort: Pulmonary effort is normal.     Breath sounds: Normal breath sounds.  Skin:    General: Skin is warm and dry.  Neurological:     Mental Status: He is alert and oriented to person, place, and time.      Lab Results:  CBC    Component Value Date/Time   WBC 7.9 12/09/2021 1402   WBC 11.2 (H) 11/19/2021 2250   RBC 5.50 12/09/2021 1402   RBC 5.43 11/19/2021 2250   HGB 13.7 12/09/2021 1402   HCT 43.3 12/09/2021 1402   PLT 200 12/09/2021 1402   MCV 79 12/09/2021 1402   MCH 24.9 (L) 12/09/2021 1402   MCH 25.0 (L) 11/19/2021 2250   MCHC 31.6 12/09/2021 1402   MCHC 31.6 11/19/2021 2250   RDW 14.2 12/09/2021 1402   LYMPHSABS 1.7 11/19/2021 2250   LYMPHSABS 2.3 05/26/2020 1419   MONOABS 0.9 11/19/2021 2250   EOSABS 0.1 11/19/2021 2250   EOSABS 0.1 05/26/2020 1419   BASOSABS 0.0 11/19/2021 2250   BASOSABS 0.0 05/26/2020 1419    BMET    Component Value Date/Time   NA 143 03/02/2022 1053   NA 141 07/07/2021 1052   K 4.4 03/02/2022 1053   CL 105 03/02/2022 1053   CO2 31 03/02/2022 1053   GLUCOSE 97 03/02/2022 1053   BUN 14 03/02/2022 1053   BUN 16 07/07/2021 1052   CREATININE 1.17 03/02/2022 1053   CREATININE 0.95 04/12/2016 1603   CALCIUM 10.1 03/02/2022 1053   GFRNONAA >60 11/19/2021 2250   GFRNONAA >89 04/12/2016 1603   GFRAA 131  04/29/2019 1342   GFRAA >89 04/12/2016 1603     Assessment & Plan:   Type 2 diabetes mellitus with complication, with long-term current use of insulin (HCC) - POCT glycosylated hemoglobin (Hb A1C) - CBC - Comprehensive metabolic panel  2. Gastroesophageal reflux disease without esophagitis  - omeprazole (PRILOSEC) 20 MG capsule; Take 1 capsule (20 mg total) by mouth daily.  Dispense: 30 capsule; Refill: 3  3. Primary hypertension  - CBC - Comprehensive metabolic panel   Follow up:  Follow up in 6 months  Patient Instructions  1. Type 2 diabetes mellitus with complication, with long-term current use of insulin (HCC)  - POCT glycosylated hemoglobin (Hb A1C) - CBC - Comprehensive metabolic panel  2. Gastroesophageal reflux disease without esophagitis  - omeprazole (PRILOSEC) 20 MG capsule; Take 1 capsule (20 mg total) by mouth daily.  Dispense: 30 capsule; Refill: 3  3. Primary hypertension  - CBC - Comprehensive metabolic panel   Follow up:  Follow up in 6 months  Hypertension, Adult Hypertension is another name for high blood pressure. High blood pressure forces your heart to work harder to pump blood. This can cause problems over time. There are two numbers in a blood pressure reading. There is a top number (systolic) over a bottom number (diastolic). It is best to have a blood pressure that is below 120/80. What are the causes? The cause of this condition is not known. Some other conditions can lead to high blood pressure. What increases the risk? Some lifestyle factors can make you more likely to develop high blood pressure: Smoking. Not getting enough exercise or physical activity. Being overweight. Having too much fat, sugar, calories, or salt (sodium) in your diet. Drinking too much alcohol. Other risk factors include: Having any of these conditions: Heart disease. Diabetes. High cholesterol. Kidney disease. Obstructive sleep apnea. Having a  family history of high blood pressure and high cholesterol. Age. The risk increases with age. Stress. What are the signs or symptoms? High blood pressure may not cause symptoms. Very high blood pressure (hypertensive crisis) may cause: Headache. Fast or uneven heartbeats (palpitations). Shortness of breath. Nosebleed. Vomiting or feeling like you may vomit (nauseous). Changes in how you see. Very bad chest pain. Feeling dizzy. Seizures. How is this treated? This condition is treated by making healthy lifestyle changes, such as: Eating healthy foods. Exercising more. Drinking less alcohol. Your doctor may prescribe medicine if lifestyle changes do not help enough and if: Your top number is above 130. Your bottom number is above 80. Your personal target blood pressure may vary. Follow these instructions at home: Eating and drinking  If told, follow the DASH eating plan. To follow this plan: Fill one half of your plate at each meal with fruits and vegetables. Fill one fourth of your plate at each meal with whole grains. Whole grains include whole-wheat pasta, brown rice, and whole-grain bread. Eat or drink low-fat dairy products, such as skim milk or low-fat yogurt. Fill one fourth of your plate at each meal with low-fat (lean) proteins. Low-fat proteins include fish, chicken without skin, eggs, beans, and tofu. Avoid fatty meat, cured and processed meat, or chicken with skin. Avoid pre-made or processed food. Limit the amount of salt in your diet to less than 1,500 mg each day. Do not drink alcohol if: Your doctor tells you not to drink. You are pregnant, may be pregnant, or are planning to become pregnant. If you drink alcohol: Limit how much you have to: 0-1 drink a day for women. 0-2 drinks a day for men. Know how much alcohol is in your drink. In the [place] one drink equals one 12 oz bottle of beer (355 mL), one 5 oz glass of wine (148 mL), or one 1 oz glass of hard liquor  (44 mL). Lifestyle  Work with your doctor to stay at a healthy weight or to lose weight. Ask your doctor what the best weight is for you. Get at least 30 minutes of exercise that  causes your heart to beat faster (aerobic exercise) most days of the week. This may include walking, swimming, or biking. Get at least 30 minutes of exercise that strengthens your muscles (resistance exercise) at least 3 days a week. This may include lifting weights or doing Pilates. Do not smoke or use any products that contain nicotine or tobacco. If you need help quitting, ask your doctor. Check your blood pressure at home as told by your doctor. Keep all follow-up visits. Medicines Take over-the-counter and prescription medicines only as told by your doctor. Follow directions carefully. Do not skip doses of blood pressure medicine. The medicine does not work as well if you skip doses. Skipping doses also puts you at risk for problems. Ask your doctor about side effects or reactions to medicines that you should watch for. Contact a doctor if: You think you are having a reaction to the medicine you are taking. You have headaches that keep coming back. You feel dizzy. You have swelling in your ankles. You have trouble with your vision. Get help right away if: You get a very bad headache. You start to feel mixed up (confused). You feel weak or numb. You feel faint. You have very bad pain in your: Chest. Belly (abdomen). You vomit more than once. You have trouble breathing. These symptoms may be an emergency. Get help right away. Call 911. Do not wait to see if the symptoms will go away. Do not drive yourself to the hospital. Summary Hypertension is another name for high blood pressure. High blood pressure forces your heart to work harder to pump blood. For most people, a normal blood pressure is less than 120/80. Making healthy choices can help lower blood pressure. If your blood pressure does not get  lower with healthy choices, you may need to take medicine. This information is not intended to replace advice given to you by your health care provider. Make sure you discuss any questions you have with your health care provider. Document Revised: 12/03/2020 Document Reviewed: 12/03/2020 Elsevier Patient Education  2024 Else[employer]   Food Choices for Gastroesophageal Reflux Disease, Adult When you have gastroesophageal reflux disease (GERD), the foods you eat and your eating habits are very important. Choosing the right foods can help ease the discomfort of GERD. Consider working with a dietitian to help you make healthy food choices. What are tips for following this plan? Reading food labels Look for foods that are low in saturated fat. Foods that have less than 5% of daily value (DV) of fat and 0 g of trans fats may help with your symptoms. Cooking Cook foods using methods other than frying. This may include baking, steaming, grilling, or broiling. These are all methods that do not need a lot of fat for cooking. To add flavor, try to use herbs that are low in spice and acidity. Meal planning  Choose healthy foods that are low in fat, such as fruits, vegetables, whole grains, low-fat dairy products, lean meats, fish, and poultry. Eat frequent, small meals instead of three large meals each day. Eat your meals slowly, in a relaxed setting. Avoid bending over or lying down until 2-3 hours after eating. Limit high-fat foods such as fatty meats or fried foods. Limit your intake of fatty foods, such as oils, butter, and shortening. Avoid the following as told by your health care provider: Foods that cause symptoms. These may be different for different people. Keep a food diary to keep track of foods that cause symptoms.  Alcohol. Drinking large amounts of liquid with meals. Eating meals during the 2-3 hours before bed. Lifestyle Maintain a healthy weight. Ask your health care provider what  weight is healthy for you. If you need to lose weight, work with your health care provider to do so safely. Exercise for at least 30 minutes on 5 or more days each week, or as told by your health care provider. Avoid wearing clothes that fit tightly around your waist and chest. Do not use any products that contain nicotine or tobacco. These products include cigarettes, chewing tobacco, and vaping devices, such as e-cigarettes. If you need help quitting, ask your health care provider. Sleep with the head of your bed raised. Use a wedge under the mattress or blocks under the bed frame to raise the head of the bed. Chew sugar-free gum after mealtimes. What foods should I eat?  Eat a healthy, well-balanced diet of fruits, vegetables, whole grains, low-fat dairy products, lean meats, fish, and poultry. Each person is different. Foods that may trigger symptoms in one person may not trigger any symptoms in another person. Work with your health care provider to identify foods that are safe for you. The items listed above may not be a complete list of recommended foods and beverages. Contact a dietitian for more information. What foods should I avoid? Limiting some of these foods may help manage the symptoms of GERD. Everyone is different. Consult a dietitian or your health care provider to help you identify the exact foods to avoid, if any. Fruits Any fruits prepared with added fat. Any fruits that cause symptoms. For some people this may include citrus fruits, such as oranges, grapefruit, pineapple, and lemons. Vegetables Deep-fried vegetables. [place] fries. Any vegetables prepared with added fat. Any vegetables that cause symptoms. For some people, this may include tomatoes and tomato products, chili peppers, onions and garlic, and horseradish. Grains Pastries or quick breads with added fat. Meats and other proteins High-fat meats, such as fatty beef or pork, hot dogs, ribs, ham, sausage, salami, and  bacon. Fried meat or protein, including fried fish and fried chicken. Nuts and nut butters, in large amounts. Dairy Whole milk and chocolate milk. Sour cream. Cream. Ice cream. Cream cheese. Milkshakes. Fats and oils Butter. Margarine. Shortening. Ghee. Beverages Coffee and tea, with or without caffeine. Carbonated beverages. Sodas. Energy drinks. Fruit juice made with acidic fruits, such as orange or grapefruit. Tomato juice. Alcoholic drinks. Sweets and desserts Chocolate and cocoa. Donuts. Seasonings and condiments Pepper. Peppermint and spearmint. Added salt. Any condiments, herbs, or seasonings that cause symptoms. For some people, this may include curry, hot sauce, or vinegar-based salad dressings. The items listed above may not be a complete list of foods and beverages to avoid. Contact a dietitian for more information. Questions to ask your health care provider Diet and lifestyle changes are usually the first steps that are taken to manage symptoms of GERD. If diet and lifestyle changes do not improve your symptoms, talk with your health care provider about taking medicines. Where to find more information [employer]: aboutgerd.org Summary When you have gastroesophageal reflux disease (GERD), food and lifestyle choices may be very helpful in easing the discomfort of GERD. Eat frequent, small meals instead of three large meals each day. Eat your meals slowly, in a relaxed setting. Avoid bending over or lying down until 2-3 hours after eating. Limit high-fat foods such as fatty meats or fried foods. This information is not intended to replace  advice given to you by your health care provider. Make sure you discuss any questions you have with your health care provider. Document Revised: 08/26/2019 Document Reviewed: 08/26/2019 Elsevier Patient Education  [address]      Ivonne Andrew, [place] 09/09/2022

## 2022-09-09 NOTE — Patient Instructions (Addendum)
-   Decrease Toujeo 54 units daily  - Take  Humalog 10 units with Breakfast and Supper - Continue Ozempic 2 mg weekly  - Continue [name] 25 mg, 1 tablet every morning  -Humalog correctional insulin: ADD extra units on insulin to your meal-time Humalog dose if your blood sugars are higher than 150. Use the scale below to help guide you:   Blood sugar before meal Number of units to inject  Less than 150 0 unit  151 -  170 1 units  171 -  190 2 units  191 -  210 3 units  211 -  230 4 units  231 -  250 5 units  251 -  270 6 units  271 -  290 7 units  291 -  310 8 units  311 - 330 9 units        HOW TO TREAT LOW BLOOD SUGARS (Blood sugar LESS THAN 70 MG/DL) Please follow the RULE OF 15 for the treatment of hypoglycemia treatment (when your (blood sugars are less than 70 mg/dL)   STEP 1: Take 15 grams of carbohydrates when your blood sugar is low, which includes:  3-4 GLUCOSE TABS  OR 3-4 OZ OF JUICE OR REGULAR SODA OR ONE TUBE OF GLUCOSE GEL    STEP 2: RECHECK blood sugar in 15 MINUTES STEP 3: If your blood sugar is still low at the 15 minute recheck --> then, go back to STEP 1 and treat AGAIN with another 15 grams of carbohydrates.

## 2022-09-10 LAB — COMPREHENSIVE METABOLIC PANEL
ALT: 41 IU/L (ref 0–44)
AST: 27 IU/L (ref 0–40)
Albumin: 4 g/dL — ABNORMAL LOW (ref 4.1–5.1)
Alkaline Phosphatase: 89 IU/L (ref 44–121)
BUN/Creatinine Ratio: 8 — ABNORMAL LOW (ref 9–20)
BUN: 9 mg/dL (ref 6–24)
Bilirubin Total: <0.2 mg/dL (ref 0.0–1.2)
CO2: 23 mmol/L (ref 20–29)
Calcium: 9.5 mg/dL (ref 8.7–10.2)
Chloride: 107 mmol/L — ABNORMAL HIGH (ref 96–106)
Creatinine, Ser: 1.09 mg/dL (ref 0.76–1.27)
Globulin, Total: 3 g/dL (ref 1.5–4.5)
Glucose: 135 mg/dL — ABNORMAL HIGH (ref 70–99)
Potassium: 4.4 mmol/L (ref 3.5–5.2)
Sodium: 143 mmol/L (ref 134–144)
Total Protein: 7 g/dL (ref 6.0–8.5)
eGFR: 88 mL/min/{1.73_m2} (ref >59)

## 2022-09-10 LAB — CBC
Hematocrit: 45.6 % (ref 37.5–51.0)
Hemoglobin: 14.2 g/dL (ref 13.0–17.7)
MCH: 24.3 pg — ABNORMAL LOW (ref 26.6–33.0)
MCHC: 31.1 g/dL — ABNORMAL LOW (ref 31.5–35.7)
MCV: 78 fL — ABNORMAL LOW (ref 79–97)
Platelets: 178 10*3/uL (ref 150–450)
RBC: 5.84 x10E6/uL — ABNORMAL HIGH (ref 4.14–5.80)
RDW: 13.9 % (ref 11.6–15.4)
WBC: 4.6 10*3/uL (ref 3.4–10.8)

## 2022-09-16 ENCOUNTER — Other Ambulatory Visit: Payer: Self-pay

## 2022-09-16 ENCOUNTER — Ambulatory Visit (INDEPENDENT_AMBULATORY_CARE_PROVIDER_SITE_OTHER): Payer: 59 | Admitting: Family

## 2022-09-16 ENCOUNTER — Other Ambulatory Visit (HOSPITAL_COMMUNITY): Payer: Self-pay

## 2022-09-16 DIAGNOSIS — L97321 Non-pressure chronic ulcer of left ankle limited to breakdown of skin: Secondary | ICD-10-CM

## 2022-09-16 DIAGNOSIS — F5101 Primary insomnia: Secondary | ICD-10-CM

## 2022-09-16 MED ORDER — HYDROXYZINE HCL 10 MG PO TABS
10.0000 mg | ORAL_TABLET | Freq: Every evening | ORAL | 0 refills | Status: DC | PRN
Start: 2022-09-16 — End: 2023-03-31
  Filled 2022-09-16 – 2023-01-04 (×5): qty 30, 30d supply, fill #0

## 2022-09-16 NOTE — Telephone Encounter (Signed)
Please advise Kh 

## 2022-09-17 ENCOUNTER — Encounter: Payer: Self-pay | Admitting: Family

## 2022-09-17 MED ORDER — PENTOXIFYLLINE ER 400 MG PO TBCR
400.0000 mg | EXTENDED_RELEASE_TABLET | Freq: Three times a day (TID) | ORAL | 3 refills | Status: DC
  Filled 2022-09-17 – 2022-09-19 (×2): qty 90, 30d supply, fill #0

## 2022-09-17 NOTE — Progress Notes (Signed)
Office Visit Note   Patient: Jonathan [name]           Date of Birth: June 25, 1980           MRN: [id number] Visit Date: 09/16/2022              Requested by: Ivonne Andrew, NP [phone] [address] [place],  [place] [address] PCP: Ivonne Andrew, NP  No chief complaint on file.     HPI: The patient is a 42 year old gentleman seen in follow-up for ulcer to the left lateral ankle.  This was developed due to prolonged walking and rubbing from his shoewear.  This has been quite slow to heal  No fever no chills no complaints of pain.  Does have chronic neuropathy.  Is currently awaiting a vascular evaluation.  Reports his appointment is not until September.  Assessment & Plan: Visit Diagnoses: No diagnosis found.  Plan: Iodosorb dressing changes daily.  We will call vascular and see if we can move up his appointment.  Have also added Trental.  Follow-Up Instructions: No follow-ups on file.   Ortho Exam  Patient is alert, oriented, no adenopathy, well-dressed, normal affect, normal respiratory effort. On examination of the left lower extremity there is trace edema no erythema or warmth the ulcer over the lateral malleolus is about 2 mm in circumference smaller with 1 mm of depth filled in with fibrinous active tissue this was debrided with gauze.  There is granulation underlying  Imaging: No results found. No images are attached to the encounter.  Labs: Lab Results  Component Value Date   HGBA1C 6.9 (A) 09/09/2022   HGBA1C 6.9 (A) 03/11/2022   HGBA1C 6.8 (A) 03/02/2022   ESRSEDRATE 5 01/30/2018   ESRSEDRATE 23 (H) 12/26/2016   ESRSEDRATE 1 12/05/2016   CRP <0.8 01/30/2018   CRP 5.8 (H) 12/26/2016   CRP <0.8 12/05/2016   REPTSTATUS 02/04/2018 FINAL 01/30/2018   GRAMSTAIN  12/26/2016    FEW WBC PRESENT, PREDOMINANTLY PMN ABUNDANT GRAM POSITIVE COCCI IN PAIRS ABUNDANT GRAM NEGATIVE RODS Performed at [facility], [address] [place], [place] [address]     CULT  01/30/2018    NO GROWTH 5 DAYS Performed at [facility], [address] [place], [place] [address]      Lab Results  Component Value Date   ALBUMIN 4.0 (L) 09/09/2022   ALBUMIN 3.3 (L) 11/19/2021   ALBUMIN 4.2 07/07/2021    No results found for: "MG" Lab Results  Component Value Date   VD25OH 18.9 (L) 05/26/2020   VD25OH 8.8 (L) 04/29/2019    No results found for: "PREALBUMIN"    Latest Ref Rng & Units 09/09/2022   10:11 AM 12/09/2021    2:02 PM 11/19/2021   10:50 PM  CBC EXTENDED  WBC 3.4 - 10.8 x10E3/uL 4.6  7.9  11.2   RBC 4.14 - 5.80 x10E6/uL 5.84  5.50  5.43   Hemoglobin 13.0 - 17.7 g/dL 08.6  57.8  46.9   HCT 37.5 - 51.0 % 45.6  43.3  43.1   Platelets 150 - 450 x10E3/uL 178  200  142   NEUT# 1.7 - 7.7 K/uL   8.4   Lymph# 0.7 - 4.0 K/uL   1.7      There is no height or weight on file to calculate BMI.  Orders:  No orders of the defined types were placed in this encounter.  No orders of the defined types  were placed in this encounter.    Procedures: No procedures performed  Clinical Data: No additional findings.  ROS:  All other systems negative, except as noted in the HPI. Review of Systems  Objective: Vital Signs: There were no vitals taken for this visit.  Specialty Comments:  No specialty comments available.  PMFS History: Patient Active Problem List   Diagnosis Date Noted   Dyslipidemia 03/03/2022   Type 2 diabetes mellitus with diabetic polyneuropathy, with long-term current use of insulin (HCC) 08/27/2021   Hypogonadotropic hypogonadism (HCC) 07/17/2019   Hemoglobin A1C between 7% and 9% indicating borderline diabetic control (HCC) 04/29/2019   Decreased libido 04/29/2019   Low testosterone in male 04/2019   Non-pressure chronic ulcer of other part of left foot with necrosis of bone (HCC)    Cutaneous abscess of left foot    Hemoglobin A1C greater than 9%, indicating poor diabetic control 08/27/2018   Amputation of left  great toe (HCC) 08/27/2018   Osteomyelitis of great toe of left foot (HCC) 01/30/2018   Osteomyelitis (HCC) 01/30/2018   Depression with anxiety 01/30/2018   Major depressive disorder, recurrent severe without psychotic features (HCC) 07/16/2017   OD (overdose of drug), intentional self-harm, initial encounter (HCC) 07/15/2017   Cellulitis 12/26/2016   Open toe wound, subsequent encounter 12/12/2016   Hyperglycemia 12/06/2016   Diabetic ulcer of left foot (HCC) 12/05/2016   Diabetic neuropathy (HCC) 10/16/2015   Erectile dysfunction 08/05/2015   Low testosterone 09/12/2014   Type 2 diabetes mellitus with complication, with long-term current use of insulin (HCC) 06/12/2014   Diabetes mellitus, type II (HCC) 03/23/2012   Noncompliance 03/23/2012   Gastroenteritis and colitis, viral 03/23/2012   Past Medical History:  Diagnosis Date   Amputation of left great toe (HCC) 01/2019   Anxiety    Decreased libido 04/2019   Diabetes mellitus    Erectile dysfunction 04/2019   Hyperglycemia 09/2019   Low testosterone in male 04/2019   Proteinuria 01/07/2020   Vitamin D deficiency 04/2019    Family History  Problem Relation Age of Onset   Other Mother        Healthy   Other Father        Healthy    Past Surgical History:  Procedure Laterality Date   AMPUTATION Left 02/02/2018   Procedure: LEFT GREAT TOE AMPUTATION;  Surgeon: Nadara Mustard, MD;  Location: [facility] OR;  Service: Orthopedics;  Laterality: Left;   AMPUTATION Left 02/08/2019   Procedure: LEFT FOOT 1ST RAY AMPUTATION;  Surgeon: Nadara Mustard, MD;  Location: [facility] OR;  Service: Orthopedics;  Laterality: Left;   Social History   Occupational History   Not on file  Tobacco Use   Smoking status: Never   Smokeless tobacco: Never  Vaping Use   Vaping status: Never Used  Substance and Sexual Activity   Alcohol use: Yes    Alcohol/week: 2.0 standard drinks of alcohol    Types: 1 Glasses of wine, 1 Cans of beer per week     Comment: occassional   Drug use: No   Sexual activity: Yes

## 2022-09-19 ENCOUNTER — Other Ambulatory Visit (HOSPITAL_COMMUNITY): Payer: Self-pay

## 2022-09-19 ENCOUNTER — Other Ambulatory Visit: Payer: Self-pay

## 2022-09-20 ENCOUNTER — Other Ambulatory Visit: Payer: Self-pay

## 2022-09-23 ENCOUNTER — Other Ambulatory Visit: Payer: Self-pay

## 2022-10-21 ENCOUNTER — Ambulatory Visit (INDEPENDENT_AMBULATORY_CARE_PROVIDER_SITE_OTHER): Payer: 59 | Admitting: Family

## 2022-10-21 ENCOUNTER — Encounter: Payer: Self-pay | Admitting: Family

## 2022-10-21 DIAGNOSIS — L97321 Non-pressure chronic ulcer of left ankle limited to breakdown of skin: Secondary | ICD-10-CM

## 2022-10-21 DIAGNOSIS — M6702 Short Achilles tendon (acquired), left ankle: Secondary | ICD-10-CM | POA: Diagnosis not present

## 2022-10-21 DIAGNOSIS — L97521 Non-pressure chronic ulcer of other part of left foot limited to breakdown of skin: Secondary | ICD-10-CM | POA: Diagnosis not present

## 2022-10-21 MED ORDER — PENTOXIFYLLINE ER 400 MG PO TBCR: 400.0000 mg | EXTENDED_RELEASE_TABLET | Freq: Three times a day (TID) | ORAL | 3 refills | Status: DC

## 2022-10-21 NOTE — Progress Notes (Signed)
Office Visit Note   Patient: Jonathan [name]           Date of Birth: [birth year]           MRN: [id number] Visit Date: 10/21/2022              Requested by: Ivonne Andrew, NP [phone] [address] [place],  [place] [address] PCP: Ivonne Andrew, NP  Chief Complaint  Patient presents with   Left Ankle - Wound Check      HPI: The patient is a 42 year old gentleman seen in follow-up for ulcer to the left lateral ankle.  This was developed due to prolonged walking and rubbing from his shoewear.  This has been quite slow to heal  No fever no chills no complaints of pain.  Does have chronic neuropathy.  Is currently awaiting a vascular evaluation.  Upcoming appointment September 13  Also has a Wagner grade 1 ulcer beneath the second metatarsal head unfortunately with any prolonged walking he develops ulcers in this area.  Has not yet begun taking Trental will resend this prescription  Assessment & Plan: Visit Diagnoses: No diagnosis found.  Plan: Iodosorb dressing changes daily.  We will call vascular and see if we can move up his appointment.  Have also added Trental.  Recommended heel cord stretching.  Placed in a VI VE compression garment today with direct skin contact.  Follow-Up Instructions: No follow-ups on file.   Ortho Exam  Patient is alert, oriented, no adenopathy, well-dressed, normal affect, normal respiratory effort. On examination of the left lower extremity there is trace edema no erythema or warmth the ulcer over the lateral malleolus is about 2 mm in circumference smaller with 1 mm of depth filled in with fibrinous active tissue this was debrided with gauze.  There is granulation underlying this is stable.  Underneath the second metatarsal head there is a 2 cm in diameter ulcer with no depth filled in with granulation there is no active drainage or surrounding maceration today does have heel cord tightness with dorsiflexion shy of neutral  Imaging: No  results found. No images are attached to the encounter.  Labs: Lab Results  Component Value Date   HGBA1C 6.9 (A) 09/09/2022   HGBA1C 6.9 (A) 03/11/2022   HGBA1C 6.8 (A) 03/02/2022   ESRSEDRATE 5 01/30/2018   ESRSEDRATE 23 (H) 12/26/2016   ESRSEDRATE 1 12/05/2016   CRP <0.8 01/30/2018   CRP 5.8 (H) 12/26/2016   CRP <0.8 12/05/2016   REPTSTATUS 02/04/2018 FINAL 01/30/2018   GRAMSTAIN  12/26/2016    FEW WBC PRESENT, PREDOMINANTLY PMN ABUNDANT GRAM POSITIVE COCCI IN PAIRS ABUNDANT GRAM NEGATIVE RODS Performed at [facility], [address] [place], [place] [address]    CULT  01/30/2018    NO GROWTH 5 DAYS Performed at [facility], [address], [place], [place] [address]      Lab Results  Component Value Date   ALBUMIN 4.0 (L) 09/09/2022   ALBUMIN 3.3 (L) 11/19/2021   ALBUMIN 4.2 07/07/2021    No results found for: "MG" Lab Results  Component Value Date   VD25OH 18.9 (L) 05/26/2020   VD25OH 8.8 (L) 04/29/2019    No results found for: "PREALBUMIN"    Latest Ref Rng & Units 09/09/2022   10:11 AM 12/09/2021    2:02 PM 11/19/2021   10:50 PM  CBC EXTENDED  WBC 3.4 - 10.8 x10E3/uL 4.6  7.9  11.2   RBC  4.14 - 5.80 x10E6/uL 5.84  5.50  5.43   Hemoglobin 13.0 - 17.7 g/dL 98.1  19.1  47.8   HCT 37.5 - 51.0 % 45.6  43.3  43.1   Platelets 150 - 450 x10E3/uL 178  200  142   NEUT# 1.7 - 7.7 K/uL   8.4   Lymph# 0.7 - 4.0 K/uL   1.7      There is no height or weight on file to calculate BMI.  Orders:  No orders of the defined types were placed in this encounter.  Meds ordered this encounter  Medications   pentoxifylline (TRENTAL) 400 MG CR tablet    Sig: Take 1 tablet (400 mg total) by mouth 3 (three) times daily with meals.    Dispense:  90 tablet    Refill:  3     Procedures: No procedures performed  Clinical Data: No additional findings.  ROS:  All other systems negative, except as noted in the HPI. Review of  Systems  Objective: Vital Signs: There were no vitals taken for this visit.  Specialty Comments:  No specialty comments available.  PMFS History: Patient Active Problem List   Diagnosis Date Noted   Dyslipidemia 03/03/2022   Type 2 diabetes mellitus with diabetic polyneuropathy, with long-term current use of insulin (HCC) 08/27/2021   Hypogonadotropic hypogonadism (HCC) 07/17/2019   Hemoglobin A1C between 7% and 9% indicating borderline diabetic control (HCC) 04/29/2019   Decreased libido 04/29/2019   Low testosterone in male 04/2019   Non-pressure chronic ulcer of other part of left foot with necrosis of bone (HCC)    Cutaneous abscess of left foot    Hemoglobin A1C greater than 9%, indicating poor diabetic control 08/27/2018   Amputation of left great toe (HCC) 08/27/2018   Osteomyelitis of great toe of left foot (HCC) 01/30/2018   Osteomyelitis (HCC) 01/30/2018   Depression with anxiety 01/30/2018   Major depressive disorder, recurrent severe without psychotic features (HCC) 07/16/2017   OD (overdose of drug), intentional self-harm, initial encounter (HCC) 07/15/2017   Cellulitis 12/26/2016   Open toe wound, subsequent encounter 12/12/2016   Hyperglycemia 12/06/2016   Diabetic ulcer of left foot (HCC) 12/05/2016   Diabetic neuropathy (HCC) 10/16/2015   Erectile dysfunction 08/05/2015   Low testosterone 09/12/2014   Type 2 diabetes mellitus with complication, with long-term current use of insulin (HCC) 06/12/2014   Diabetes mellitus, type II (HCC) 03/23/2012   Noncompliance 03/23/2012   Gastroenteritis and colitis, viral 03/23/2012   Past Medical History:  Diagnosis Date   Amputation of left great toe (HCC) 01/2019   Anxiety    Decreased libido 04/2019   Diabetes mellitus    Erectile dysfunction 04/2019   Hyperglycemia 09/2019   Low testosterone in male 04/2019   Proteinuria 01/07/2020   Vitamin D deficiency 04/2019    Family History  Problem Relation Age of Onset    Other Mother        Healthy   Other Father        Healthy    Past Surgical History:  Procedure Laterality Date   AMPUTATION Left 02/02/2018   Procedure: LEFT GREAT TOE AMPUTATION;  Surgeon: Nadara Mustard, MD;  Location: [facility] OR;  Service: Orthopedics;  Laterality: Left;   AMPUTATION Left 02/08/2019   Procedure: LEFT FOOT 1ST RAY AMPUTATION;  Surgeon: Nadara Mustard, MD;  Location: [facility] OR;  Service: Orthopedics;  Laterality: Left;   Social History   Occupational History   Not on file  Tobacco  Use   Smoking status: Never   Smokeless tobacco: Never  Vaping Use   Vaping status: Never Used  Substance and Sexual Activity   Alcohol use: Yes    Alcohol/week: 2.0 standard drinks of alcohol    Types: 1 Glasses of wine, 1 Cans of beer per week    Comment: occassional   Drug use: No   Sexual activity: Yes

## 2022-10-22 ENCOUNTER — Other Ambulatory Visit: Payer: Self-pay | Admitting: Nurse Practitioner

## 2022-10-22 ENCOUNTER — Other Ambulatory Visit: Payer: Self-pay | Admitting: Internal Medicine

## 2022-10-22 DIAGNOSIS — I10 Essential (primary) hypertension: Secondary | ICD-10-CM

## 2022-10-26 ENCOUNTER — Other Ambulatory Visit: Payer: Self-pay

## 2022-10-26 DIAGNOSIS — S98112A Complete traumatic amputation of left great toe, initial encounter: Secondary | ICD-10-CM

## 2022-10-26 DIAGNOSIS — L0390 Cellulitis, unspecified: Secondary | ICD-10-CM

## 2022-11-02 ENCOUNTER — Other Ambulatory Visit: Payer: Self-pay | Admitting: Internal Medicine

## 2022-11-11 ENCOUNTER — Ambulatory Visit (HOSPITAL_COMMUNITY)
Admission: RE | Admit: 2022-11-11 | Discharge: 2022-11-11 | Disposition: A | Discharge status: Home / Self Care | Payer: 59 | Source: Ambulatory Visit | Attending: Vascular Surgery

## 2022-11-11 ENCOUNTER — Ambulatory Visit (INDEPENDENT_AMBULATORY_CARE_PROVIDER_SITE_OTHER)
Admission: RE | Admit: 2022-11-11 | Discharge: 2022-11-11 | Disposition: A | Discharge status: Home / Self Care | Payer: 59 | Source: Ambulatory Visit | Attending: Vascular Surgery

## 2022-11-11 ENCOUNTER — Encounter: Payer: Self-pay | Admitting: Vascular Surgery

## 2022-11-11 ENCOUNTER — Ambulatory Visit (INDEPENDENT_AMBULATORY_CARE_PROVIDER_SITE_OTHER): Payer: 59 | Admitting: Vascular Surgery

## 2022-11-11 VITALS — BP 112/71 | HR 98 | Temp 97.9°F | Resp 20 | Ht >= 80 in | Wt 290.0 lb

## 2022-11-11 DIAGNOSIS — L0390 Cellulitis, unspecified: Secondary | ICD-10-CM | POA: Insufficient documentation

## 2022-11-11 DIAGNOSIS — S98112A Complete traumatic amputation of left great toe, initial encounter: Secondary | ICD-10-CM | POA: Diagnosis present

## 2022-11-11 DIAGNOSIS — S91302A Unspecified open wound, left foot, initial encounter: Secondary | ICD-10-CM | POA: Diagnosis not present

## 2022-11-11 LAB — VAS US ABI WITH/WO TBI
Left ABI: 1.13
Right ABI: 1.23

## 2022-11-11 NOTE — Progress Notes (Signed)
Office Note     CC: Left extremity foot wound Requesting Provider:  Ivonne Andrew, NP  HPI: Jonathan [name] is a 42 y.o. (1980-06-27) male presenting at the request of .Ivonne Andrew, NP with left lower extremity foot wound.  On exam, Jonathan [name] was doing well.  Originally from [place], he immigrated to the [place] in 2005.  He was diagnosed with diabetes in 2010, and is worked hard to keep his A1c under control.  He has a previous history of left foot first ray amputation for abscess with osteomyelitis.  Patient presents today for ABI to ensure he has adequate blood flow.  He has a 1 month history of wound on the plantar aspect of the second metatarsal.  The patient works from home, and states he goes on long walks in the evenings.  After one of the walks, he noted a wound on the plantar aspect of the second metatarsal.  Initially this was large in size.  It has decreased.  No drainage, no purulence.  No fevers, no chills   Past Medical History:  Diagnosis Date   Amputation of left great toe (HCC) 01/2019   Anxiety    Decreased libido 04/2019   Diabetes mellitus    Erectile dysfunction 04/2019   Hyperglycemia 09/2019   Low testosterone in male 04/2019   Proteinuria 01/07/2020   Vitamin D deficiency 04/2019    Past Surgical History:  Procedure Laterality Date   AMPUTATION Left 02/02/2018   Procedure: LEFT GREAT TOE AMPUTATION;  Surgeon: Nadara Mustard, MD;  Location: [facility] OR;  Service: Orthopedics;  Laterality: Left;   AMPUTATION Left 02/08/2019   Procedure: LEFT FOOT 1ST RAY AMPUTATION;  Surgeon: Nadara Mustard, MD;  Location: [facility] OR;  Service: Orthopedics;  Laterality: Left;    Social History   Socioeconomic History   Marital status: Single    Spouse name: Not on file   Number of children: Not on file   Years of education: Not on file   Highest education level: Bachelor's degree (e.g., BA, AB, BS)  Occupational History   Not on file  Tobacco Use   Smoking  status: Never   Smokeless tobacco: Never  Vaping Use   Vaping status: Never Used  Substance and Sexual Activity   Alcohol use: Yes    Alcohol/week: 2.0 standard drinks of alcohol    Types: 1 Glasses of wine, 1 Cans of beer per week    Comment: occassional   Drug use: No   Sexual activity: Yes  Other Topics Concern   Not on file  Social History Narrative   Not on file   Social Determinants of Health   Financial Resource Strain: Low Risk  (09/05/2022)   Overall Financial Resource Strain (CARDIA)    Difficulty of Paying Living Expenses: Not very hard  Food Insecurity: No Food Insecurity (09/05/2022)   Hunger Vital Sign    Worried About Running Out of Food in the Last Year: Never true    Ran Out of Food in the Last Year: Never true  Transportation Needs: No Transportation Needs (09/05/2022)   PRAPARE - Administrator, Civil Service (Medical): No    Lack of Transportation (Non-Medical): No  Physical Activity: Insufficiently Active (09/05/2022)   Exercise Vital Sign    Days of Exercise per Week: 1 day    Minutes of Exercise per Session: 20 min  Stress: No Stress Concern Present (09/05/2022)   Jonathan [name] of Occupational Health - Occupational Stress  Questionnaire    Feeling of Stress : Not at all  Social Connections: Moderately Isolated (09/05/2022)   Social Connection and Isolation Panel [NHANES]    Frequency of Communication with Friends and Family: More than three times a week    Frequency of Social Gatherings with Friends and Family: Once a week    Attends Religious Services: 1 to 4 times per year    Active Member of [employer] or Organizations: No    Attends Engineer, structural: Not on file    Marital Status: Never married  Catering manager Violence: Not on file   Family History  Problem Relation Age of Onset   Other Mother        Healthy   Other Father        Healthy    Current Outpatient Medications  Medication Sig Dispense Refill   amLODipine  (NORVASC) 5 MG tablet TAKE 1 TABLET BY MOUTH DAILY 90 tablet 3   atorvastatin (LIPITOR) 10 MG tablet Take 1 tablet (10 mg total) by mouth daily. 30 tablet 11   blood glucose meter kit and supplies Dispense based on patient and insurance preference. Use up to four times daily as directed. (FOR ICD-10 E10.9, E11.9). 1 each 0   Blood Glucose Monitoring Suppl (ONETOUCH VERIO) w/Device KIT Inject 1 Units into the skin QID. Use meter 4 times daily as directed 1 kit 0   clomiPHENE (CLOMID) 50 MG tablet Take 0.5 tablets (25 mg total) by mouth daily. 15 tablet 3   empagliflozin ([name]) 25 MG TABS tablet Take 1 tablet (25 mg total) by mouth daily before breakfast. 90 tablet 3   gabapentin (NEURONTIN) 300 MG capsule Take 1 capsule (300 mg total) by mouth at bedtime. 30 capsule 11   hydrOXYzine (ATARAX) 10 MG tablet Take 1 tablet (10 mg total) by mouth at bedtime as needed. 30 tablet 0   ibuprofen (ADVIL) 800 MG tablet Take 1 tablet (800 mg total) by mouth every 8 (eight) hours as needed. 30 tablet 6   insulin glargine, 1 Unit Dial, (TOUJEO SOLOSTAR) 300 UNIT/ML Solostar Pen Inject 54 Units into the skin daily in the afternoon. 30 mL 3   insulin lispro (HUMALOG KWIKPEN) 100 UNIT/ML KwikPen Max daily  60 units 45 mL 6   Insulin Pen Needle 32G X 4 MM MISC 1 Device by Does not apply route in the morning, at noon, in the evening, and at bedtime. 400 each 3   Lancets (ONETOUCH ULTRASOFT) lancets Use 4 times daily as directed  E11.42, Z79.4 100 each 11   losartan (COZAAR) 25 MG tablet TAKE 1 TABLET BY MOUTH DAILY 90 tablet 3   omeprazole (PRILOSEC) 20 MG capsule Take 1 capsule (20 mg total) by mouth daily. 30 capsule 3   ONETOUCH VERIO test strip USE AS DIRECTED 300 strip 3   oxyCODONE-acetaminophen (PERCOCET) 5-325 MG tablet Take 1-2 tablets by mouth every 4 (four) hours as needed. 10 tablet 0   OZEMPIC, 2 MG/DOSE, 8 MG/3ML SOPN INJECT SUBCUTANEOUSLY 2 MG EVERY WEEK 9 mL 3   pentoxifylline (TRENTAL) 400 MG CR  tablet Take 1 tablet (400 mg total) by mouth 3 (three) times daily with meals. 90 tablet 3   tadalafil (CIALIS) 5 MG tablet Take 1 tablet (5 mg total) by mouth daily. 30 tablet 0   No current facility-administered medications for this visit.    No Known Allergies   REVIEW OF SYSTEMS:  [X]  denotes positive finding, [ ]  denotes negative finding Cardiac  Comments:  Chest pain or chest pressure:    Shortness of breath upon exertion:    Short of breath when lying flat:    Irregular heart rhythm:        Vascular    Pain in calf, thigh, or hip brought on by ambulation:    Pain in feet at night that wakes you up from your sleep:     Blood clot in your veins:    Leg swelling:         Pulmonary    Oxygen at home:    Productive cough:     Wheezing:         Neurologic    Sudden weakness in arms or legs:     Sudden numbness in arms or legs:     Sudden onset of difficulty speaking or slurred speech:    Temporary loss of vision in one eye:     Problems with dizziness:         Gastrointestinal    Blood in stool:     Vomited blood:         Genitourinary    Burning when urinating:     Blood in urine:        Psychiatric    Major depression:         Hematologic    Bleeding problems:    Problems with blood clotting too easily:        Skin    Rashes or ulcers:        Constitutional    Fever or chills:      PHYSICAL EXAMINATION:  Vitals:   11/11/22 1340  BP: 112/71  Pulse: 98  Resp: 20  Temp: 97.9 F (36.6 C)  SpO2: 97%  Weight: 290 lb (131.5 kg)  Height: 6\' 8"  (2.032 m)    General:  WDWN in NAD; vital signs documented above Gait: Not observed HENT: WNL, normocephalic Pulmonary: normal non-labored breathing , without wheezing Cardiac: regular HR Abdomen: soft, NT, no masses Skin: without rashes Vascular Exam/Pulses:  Right Left  Radial 2+ (normal) 2+ (normal)  Ulnar    Femoral    Popliteal    DP 2+ (normal) 2+ (normal)  PT 2+ (normal) 2+ (normal)    Extremities: without ischemic changes, without Gangrene , without cellulitis; with open wounds;   Wound is healing and callused Musculoskeletal: no muscle wasting or atrophy  Neurologic: A&O X 3;  No focal weakness or paresthesias are detected Psychiatric:  The pt has Normal affect.   Non-Invasive Vascular Imaging:   ABI Findings:  +---------+------------------+-----+---------+--------+  Right   Rt Pressure (mmHg)IndexWaveform Comment   +---------+------------------+-----+---------+--------+  Brachial 131                                       +---------+------------------+-----+---------+--------+  PTA     161               1.23 triphasic          +---------+------------------+-----+---------+--------+  DP      151               1.15 triphasic          +---------+------------------+-----+---------+--------+  Great Toe137               1.05                    +---------+------------------+-----+---------+--------+   +---------+------------------+-----+---------+------------+  Left    Lt Pressure (mmHg)IndexWaveform Comment       +---------+------------------+-----+---------+------------+  Brachial 122                                           +---------+------------------+-----+---------+------------+  PTA     143               1.09 triphasic              +---------+------------------+-----+---------+------------+  DP      148               1.13 triphasic              +---------+------------------+-----+---------+------------+  Great Toe98                0.75          Second digit  +---------+------------------+-----+---------+------------+     ASSESSMENT/PLAN: Stanislaw Moriarity is a 42 y.o. male presenting with slowly healing left second metatarsal plantar wound.  On physical exam, the wound was shallow, with callus.  He had a palpable dorsalis pedis and posterior tibial pulse. ABI was reviewed, and was normal with  triphasic waveforms.  I do not think that he would benefit from left lower extremity angiography in an effort to define and improve distal perfusion for wound healing.  The wound appears to be pressure induced likely from his long walks.  He would benefit from a shoe insert as I think the majority of his weight is on the second metatarsal now that he has a first toe ray amp.  I am unsure as to what inserts are available, but we will discuss this with Dr. Lajoyce Corners.  For the time being, I asked him to limit his long and keep the areas dry as possible. Follow-up already scheduled with Dr. Lajoyce Corners.   Victorino Sparrow, MD [facility] [phone]

## 2022-11-25 ENCOUNTER — Ambulatory Visit: Payer: 59 | Admitting: Family

## 2022-11-25 DIAGNOSIS — L97321 Non-pressure chronic ulcer of left ankle limited to breakdown of skin: Secondary | ICD-10-CM | POA: Diagnosis not present

## 2022-11-25 DIAGNOSIS — M6702 Short Achilles tendon (acquired), left ankle: Secondary | ICD-10-CM | POA: Diagnosis not present

## 2022-11-25 DIAGNOSIS — Z794 Long term (current) use of insulin: Secondary | ICD-10-CM

## 2022-11-25 DIAGNOSIS — Z89412 Acquired absence of left great toe: Secondary | ICD-10-CM

## 2022-11-25 DIAGNOSIS — E118 Type 2 diabetes mellitus with unspecified complications: Secondary | ICD-10-CM

## 2022-11-25 DIAGNOSIS — S98112A Complete traumatic amputation of left great toe, initial encounter: Secondary | ICD-10-CM

## 2022-11-29 ENCOUNTER — Encounter: Payer: Self-pay | Admitting: Family

## 2022-11-29 NOTE — Progress Notes (Signed)
Office Visit Note   Patient: Jonathan [name]           Date of Birth: 04-02-1980           MRN: [id number] Visit Date: 11/25/2022              Requested by: Ivonne Andrew, NP [phone] [address] [place],  [place] [address] PCP: Ivonne Andrew, NP  Chief Complaint  Patient presents with   Left Ankle - Wound Check      HPI: The patient is a 42 year old gentleman seen in follow-up for ulcer to the left lateral ankle.  This was developed due to prolonged walking and rubbing from his shoewear.  This has been quite slow to heal  No fever no chills no complaints of pain.  Does have chronic neuropathy.    Today he is pleased with the healing of the lateral ankle ulcer as well as the plantar ulcer to the forefoot  Reports has begun doing exercise such as the elliptical or recumbent bicycle and attempt to offload his feet  Assessment & Plan: Visit Diagnoses: No diagnosis found.  Plan: He will continue with his close monitoring of his feet pleased with healing of ulcers he will follow-up as needed   Follow-Up Instructions: No follow-ups on file.   Ortho Exam  Patient is alert, oriented, no adenopathy, well-dressed, normal affect, normal respiratory effort. On examination of the left lower extremity the lateral ankle ulcer is well-healed there is no surrounding erythema or open area of the ulcer beneath the second metatarsal head is also fully healed there is thin hyperkeratotic tissue there is no open area or erythema  Imaging: No results found. No images are attached to the encounter.  Labs: Lab Results  Component Value Date   HGBA1C 6.9 (A) 09/09/2022   HGBA1C 6.9 (A) 03/11/2022   HGBA1C 6.8 (A) 03/02/2022   ESRSEDRATE 5 01/30/2018   ESRSEDRATE 23 (H) 12/26/2016   ESRSEDRATE 1 12/05/2016   CRP <0.8 01/30/2018   CRP 5.8 (H) 12/26/2016   CRP <0.8 12/05/2016   REPTSTATUS 02/04/2018 FINAL 01/30/2018   GRAMSTAIN  12/26/2016    FEW WBC PRESENT, PREDOMINANTLY  PMN ABUNDANT GRAM POSITIVE COCCI IN PAIRS ABUNDANT GRAM NEGATIVE RODS Performed at [facility], [address] [place], [place] [address]    CULT  01/30/2018    NO GROWTH 5 DAYS Performed at [facility], [address], [place], [place] [address]      Lab Results  Component Value Date   ALBUMIN 4.0 (L) 09/09/2022   ALBUMIN 3.3 (L) 11/19/2021   ALBUMIN 4.2 07/07/2021    No results found for: "MG" Lab Results  Component Value Date   VD25OH 18.9 (L) 05/26/2020   VD25OH 8.8 (L) 04/29/2019    No results found for: "PREALBUMIN"    Latest Ref Rng & Units 09/09/2022   10:11 AM 12/09/2021    2:02 PM 11/19/2021   10:50 PM  CBC EXTENDED  WBC 3.4 - 10.8 x10E3/uL 4.6  7.9  11.2   RBC 4.14 - 5.80 x10E6/uL 5.84  5.50  5.43   Hemoglobin 13.0 - 17.7 g/dL 34.7  42.5  95.6   HCT 37.5 - 51.0 % 45.6  43.3  43.1   Platelets 150 - 450 x10E3/uL 178  200  142   NEUT# 1.7 - 7.7 K/uL   8.4   Lymph# 0.7 - 4.0 K/uL   1.7      There  is no height or weight on file to calculate BMI.  Orders:  No orders of the defined types were placed in this encounter.  No orders of the defined types were placed in this encounter.    Procedures: No procedures performed  Clinical Data: No additional findings.  ROS:  All other systems negative, except as noted in the HPI. Review of Systems  Objective: Vital Signs: There were no vitals taken for this visit.  Specialty Comments:  No specialty comments available.  PMFS History: Patient Active Problem List   Diagnosis Date Noted   Dyslipidemia 03/03/2022   Type 2 diabetes mellitus with diabetic polyneuropathy, with long-term current use of insulin (HCC) 08/27/2021   Hypogonadotropic hypogonadism (HCC) 07/17/2019   Hemoglobin A1C between 7% and 9% indicating borderline diabetic control (HCC) 04/29/2019   Decreased libido 04/29/2019   Low testosterone in male 04/2019   Non-pressure chronic ulcer of other part of left foot with  necrosis of bone (HCC)    Cutaneous abscess of left foot    Hemoglobin A1C greater than 9%, indicating poor diabetic control 08/27/2018   Amputation of left great toe (HCC) 08/27/2018   Osteomyelitis of great toe of left foot (HCC) 01/30/2018   Osteomyelitis (HCC) 01/30/2018   Depression with anxiety 01/30/2018   Major depressive disorder, recurrent severe without psychotic features (HCC) 07/16/2017   OD (overdose of drug), intentional self-harm, initial encounter (HCC) 07/15/2017   Cellulitis 12/26/2016   Open toe wound, subsequent encounter 12/12/2016   Hyperglycemia 12/06/2016   Diabetic ulcer of left foot (HCC) 12/05/2016   Diabetic neuropathy (HCC) 10/16/2015   Erectile dysfunction 08/05/2015   Low testosterone 09/12/2014   Type 2 diabetes mellitus with complication, with long-term current use of insulin (HCC) 06/12/2014   Diabetes mellitus, type II (HCC) 03/23/2012   Noncompliance 03/23/2012   Gastroenteritis and colitis, viral 03/23/2012   Past Medical History:  Diagnosis Date   Amputation of left great toe (HCC) 01/2019   Anxiety    Decreased libido 04/2019   Diabetes mellitus    Erectile dysfunction 04/2019   Hyperglycemia 09/2019   Low testosterone in male 04/2019   Proteinuria 01/07/2020   Vitamin D deficiency 04/2019    Family History  Problem Relation Age of Onset   Other Mother        Healthy   Other Father        Healthy    Past Surgical History:  Procedure Laterality Date   AMPUTATION Left 02/02/2018   Procedure: LEFT GREAT TOE AMPUTATION;  Surgeon: Nadara Mustard, MD;  Location: [facility] OR;  Service: Orthopedics;  Laterality: Left;   AMPUTATION Left 02/08/2019   Procedure: LEFT FOOT 1ST RAY AMPUTATION;  Surgeon: Nadara Mustard, MD;  Location: [facility] OR;  Service: Orthopedics;  Laterality: Left;   Social History   Occupational History   Not on file  Tobacco Use   Smoking status: Never   Smokeless tobacco: Never  Vaping Use   Vaping status: Never Used   Substance and Sexual Activity   Alcohol use: Yes    Alcohol/week: 2.0 standard drinks of alcohol    Types: 1 Glasses of wine, 1 Cans of beer per week    Comment: occassional   Drug use: No   Sexual activity: Yes

## 2022-12-19 ENCOUNTER — Other Ambulatory Visit: Payer: Self-pay

## 2022-12-19 ENCOUNTER — Other Ambulatory Visit (HOSPITAL_COMMUNITY): Payer: Self-pay

## 2022-12-19 ENCOUNTER — Encounter (HOSPITAL_COMMUNITY): Payer: Self-pay

## 2022-12-19 MED ORDER — TADALAFIL 5 MG PO TABS
5.0000 mg | ORAL_TABLET | Freq: Every day | ORAL | 0 refills | Status: DC
  Filled 2022-12-19 – 2023-01-04 (×3): qty 30, 30d supply, fill #0

## 2022-12-19 NOTE — Telephone Encounter (Signed)
Please advise KH 

## 2022-12-29 ENCOUNTER — Other Ambulatory Visit: Payer: Self-pay

## 2023-01-04 ENCOUNTER — Other Ambulatory Visit (HOSPITAL_COMMUNITY): Payer: Self-pay

## 2023-01-04 ENCOUNTER — Other Ambulatory Visit: Payer: Self-pay

## 2023-01-04 ENCOUNTER — Telehealth: Payer: Self-pay

## 2023-01-04 ENCOUNTER — Other Ambulatory Visit: Payer: Self-pay | Admitting: Internal Medicine

## 2023-01-04 ENCOUNTER — Other Ambulatory Visit: Payer: Self-pay | Admitting: Nurse Practitioner

## 2023-01-04 DIAGNOSIS — Z794 Long term (current) use of insulin: Secondary | ICD-10-CM

## 2023-01-04 DIAGNOSIS — R739 Hyperglycemia, unspecified: Secondary | ICD-10-CM

## 2023-01-04 DIAGNOSIS — R7309 Other abnormal glucose: Secondary | ICD-10-CM

## 2023-01-04 MED ORDER — INSULIN LISPRO (1 UNIT DIAL) 100 UNIT/ML (KWIKPEN)
PEN_INJECTOR | SUBCUTANEOUS | 0 refills | Status: DC
  Filled 2023-01-04: qty 18, 30d supply, fill #0

## 2023-01-04 MED ORDER — TOUJEO SOLOSTAR 300 UNIT/ML ~~LOC~~ SOPN
54.0000 [IU] | PEN_INJECTOR | Freq: Every day | SUBCUTANEOUS | 1 refills | Status: DC
Start: 2023-01-04 — End: 2023-01-04
  Filled 2023-01-04: qty 30, fill #0

## 2023-01-04 MED ORDER — BASAGLAR KWIKPEN 100 UNIT/ML ~~LOC~~ SOPN
54.0000 [IU] | PEN_INJECTOR | Freq: Every day | SUBCUTANEOUS | 4 refills | Status: DC
  Filled 2023-01-04: qty 15, 27d supply, fill #0
  Filled 2023-01-27: qty 15, 27d supply, fill #1
  Filled 2023-02-21 (×2): qty 15, 27d supply, fill #2
  Filled 2023-03-22 (×3): qty 15, 27d supply, fill #3
  Filled 2023-04-05 (×2): qty 15, 27d supply, fill #4
  Filled 2023-04-24: qty 15, 27d supply, fill #5
  Filled 2023-05-11: qty 15, 27d supply, fill #6

## 2023-01-04 NOTE — Telephone Encounter (Signed)
Patient doesn't have insurance right now and pharmacy would like to change Toujeo to [place] as it would be no charge to patient.

## 2023-01-05 ENCOUNTER — Other Ambulatory Visit: Payer: Self-pay

## 2023-01-05 MED ORDER — LANTUS SOLOSTAR 100 UNIT/ML ~~LOC~~ SOPN
50.0000 [IU] | PEN_INJECTOR | Freq: Every day | SUBCUTANEOUS | 0 refills | Status: DC
  Filled 2023-01-05: qty 15, fill #0

## 2023-01-06 ENCOUNTER — Other Ambulatory Visit: Payer: Self-pay

## 2023-01-09 ENCOUNTER — Other Ambulatory Visit: Payer: Self-pay

## 2023-01-12 ENCOUNTER — Other Ambulatory Visit: Payer: Self-pay | Admitting: Internal Medicine

## 2023-01-12 ENCOUNTER — Other Ambulatory Visit: Payer: Self-pay | Admitting: Nurse Practitioner

## 2023-01-12 ENCOUNTER — Other Ambulatory Visit: Payer: Self-pay

## 2023-01-12 DIAGNOSIS — I10 Essential (primary) hypertension: Secondary | ICD-10-CM

## 2023-01-12 MED ORDER — OZEMPIC (2 MG/DOSE) 8 MG/3ML ~~LOC~~ SOPN
2.0000 mg | PEN_INJECTOR | SUBCUTANEOUS | 3 refills | Status: DC
  Filled 2023-01-12: qty 9, fill #0
  Filled 2023-01-27 – 2023-05-11 (×11): qty 9, 84d supply, fill #0

## 2023-01-12 MED ORDER — EMPAGLIFLOZIN 25 MG PO TABS
25.0000 mg | ORAL_TABLET | Freq: Every day | ORAL | 3 refills | Status: DC
  Filled 2023-01-12: qty 30, 30d supply, fill #0
  Filled 2023-02-07 – 2023-05-11 (×7): qty 30, 30d supply, fill #1

## 2023-01-12 MED ORDER — LOSARTAN POTASSIUM 25 MG PO TABS
25.0000 mg | ORAL_TABLET | Freq: Every day | ORAL | 3 refills | Status: DC
  Filled 2023-01-12: qty 90, 90d supply, fill #0
  Filled 2023-02-21 – 2023-03-06 (×2): qty 90, 90d supply, fill #1

## 2023-01-13 ENCOUNTER — Telehealth: Payer: Self-pay

## 2023-01-13 ENCOUNTER — Other Ambulatory Visit: Payer: Self-pay

## 2023-01-13 NOTE — Telephone Encounter (Signed)
-----   Message from Johnney Ou [facility] sent at 01/13/2023  8:08 AM EST ----- Please offer patient assistance for [name] and Ozempic to the patient ----- Message ----- From: Weldon Picking, CPhT Sent: 01/12/2023   4:35 PM EST To: Orland Penman, MD  Patient last filled [name] and Ozempic in March with [facility] with insurance. He now states he is uninsured. Please initiate PAP enrollment if patient needs [name] and Ozempic as our pharmacy does not offer discounted pricing for these meds for uninsured patients.  Trulicity is available at no charge to patient via the [facility] program if changing therapy would be appropriate.

## 2023-01-13 NOTE — Telephone Encounter (Signed)
Patient came by the office and completed the Patient Assistance for [employer] . Placed all in provider folder at front desk

## 2023-01-13 NOTE — Telephone Encounter (Signed)
Patient will stop by and fill out application for Ozempic and [name]. Application placed up front

## 2023-01-16 ENCOUNTER — Other Ambulatory Visit: Payer: Self-pay | Admitting: Internal Medicine

## 2023-01-16 ENCOUNTER — Other Ambulatory Visit: Payer: Self-pay

## 2023-01-16 MED ORDER — NOVOLOG FLEXPEN 100 UNIT/ML ~~LOC~~ SOPN: PEN_INJECTOR | SUBCUTANEOUS | 2 refills | Status: DC

## 2023-01-16 MED ORDER — TRESIBA FLEXTOUCH 200 UNIT/ML ~~LOC~~ SOPN: 54.0000 [IU] | PEN_INJECTOR | Freq: Every day | SUBCUTANEOUS | Status: DC

## 2023-01-27 ENCOUNTER — Other Ambulatory Visit: Payer: Self-pay

## 2023-01-29 ENCOUNTER — Other Ambulatory Visit: Payer: Self-pay | Admitting: Nurse Practitioner

## 2023-01-29 MED ORDER — TADALAFIL 5 MG PO TABS
5.0000 mg | ORAL_TABLET | Freq: Every day | ORAL | 0 refills | Status: DC
  Filled 2023-01-29 – 2023-03-06 (×4): qty 30, 30d supply, fill #0

## 2023-01-30 ENCOUNTER — Other Ambulatory Visit (HOSPITAL_COMMUNITY): Payer: Self-pay

## 2023-01-30 ENCOUNTER — Other Ambulatory Visit: Payer: Self-pay

## 2023-01-30 ENCOUNTER — Encounter (HOSPITAL_COMMUNITY): Payer: Self-pay

## 2023-02-01 ENCOUNTER — Other Ambulatory Visit: Payer: Self-pay

## 2023-02-07 ENCOUNTER — Encounter: Payer: Self-pay | Admitting: Pharmacist

## 2023-02-07 ENCOUNTER — Other Ambulatory Visit: Payer: Self-pay

## 2023-02-07 ENCOUNTER — Other Ambulatory Visit (HOSPITAL_COMMUNITY): Payer: Self-pay

## 2023-02-10 ENCOUNTER — Other Ambulatory Visit: Payer: Self-pay

## 2023-02-21 ENCOUNTER — Other Ambulatory Visit: Payer: Self-pay

## 2023-02-21 ENCOUNTER — Other Ambulatory Visit: Payer: Self-pay | Admitting: Internal Medicine

## 2023-02-23 ENCOUNTER — Other Ambulatory Visit: Payer: Self-pay

## 2023-02-23 ENCOUNTER — Encounter: Payer: Self-pay | Admitting: Pharmacist

## 2023-02-23 MED ORDER — TRESIBA FLEXTOUCH 200 UNIT/ML ~~LOC~~ SOPN: 54.0000 [IU] | PEN_INJECTOR | Freq: Every day | SUBCUTANEOUS | Status: DC

## 2023-02-23 NOTE — Telephone Encounter (Signed)
Jonathan [name] refill request complete

## 2023-02-27 ENCOUNTER — Other Ambulatory Visit: Payer: Self-pay

## 2023-03-06 ENCOUNTER — Other Ambulatory Visit: Payer: Self-pay | Admitting: Nurse Practitioner

## 2023-03-06 ENCOUNTER — Other Ambulatory Visit: Payer: Self-pay

## 2023-03-06 DIAGNOSIS — E118 Type 2 diabetes mellitus with unspecified complications: Secondary | ICD-10-CM

## 2023-03-06 MED ORDER — GNP TRUE METRIX GLUCOSE STRIPS VI STRP
ORAL_STRIP | 12 refills | Status: AC
  Filled 2023-03-06: qty 100, 50d supply, fill #0
  Filled 2023-05-11: qty 100, 50d supply, fill #1

## 2023-03-06 MED ORDER — TRUE METRIX METER W/DEVICE KIT
1.0000 [IU] | PACK | Freq: Two times a day (BID) | 0 refills | Status: AC
  Filled 2023-03-06: qty 1, 30d supply, fill #0

## 2023-03-06 MED ORDER — ONETOUCH VERIO W/DEVICE KIT
1.0000 [IU] | PACK | Freq: Four times a day (QID) | 0 refills | Status: DC
  Filled 2023-03-06: qty 1, 30d supply, fill #0
  Filled 2023-03-22: qty 1, fill #0

## 2023-03-06 MED ORDER — TRUEPLUS LANCETS 30G MISC
1.0000 | Freq: Two times a day (BID) | 0 refills | Status: DC
  Filled 2023-03-06: qty 100, 50d supply, fill #0

## 2023-03-07 ENCOUNTER — Other Ambulatory Visit: Payer: Self-pay

## 2023-03-07 ENCOUNTER — Other Ambulatory Visit (HOSPITAL_COMMUNITY): Payer: Self-pay

## 2023-03-17 ENCOUNTER — Ambulatory Visit (INDEPENDENT_AMBULATORY_CARE_PROVIDER_SITE_OTHER): Payer: Self-pay | Admitting: Internal Medicine

## 2023-03-17 ENCOUNTER — Encounter: Payer: Self-pay | Admitting: Internal Medicine

## 2023-03-17 ENCOUNTER — Ambulatory Visit: Payer: Self-pay | Admitting: Nurse Practitioner

## 2023-03-17 VITALS — BP 130/86 | HR 99 | Ht >= 80 in | Wt 331.0 lb

## 2023-03-17 DIAGNOSIS — E1129 Type 2 diabetes mellitus with other diabetic kidney complication: Secondary | ICD-10-CM

## 2023-03-17 DIAGNOSIS — E1142 Type 2 diabetes mellitus with diabetic polyneuropathy: Secondary | ICD-10-CM

## 2023-03-17 DIAGNOSIS — E118 Type 2 diabetes mellitus with unspecified complications: Secondary | ICD-10-CM

## 2023-03-17 DIAGNOSIS — Z794 Long term (current) use of insulin: Secondary | ICD-10-CM

## 2023-03-17 DIAGNOSIS — R809 Proteinuria, unspecified: Secondary | ICD-10-CM

## 2023-03-17 LAB — POCT GLUCOSE (DEVICE FOR HOME USE)
Glucose Fasting, POC: 81 mg/dL (ref 70–99)
POC Glucose: 58 mg/dL — AB (ref 70–99)

## 2023-03-17 LAB — GLUCOSE, POCT (MANUAL RESULT ENTRY): POC Glucose: 64 mg/dL — AB (ref 70–99)

## 2023-03-17 LAB — POCT GLYCOSYLATED HEMOGLOBIN (HGB A1C): Hemoglobin A1C: 8.7 % — AB (ref 4.0–5.6)

## 2023-03-17 NOTE — Patient Instructions (Addendum)
-   Continue Toujeo 54 units daily  - Take  Humalog 10 units with Breakfast and Supper - Continue [name] 25 mg, 1 tablet every morning  - Humalog correctional insulin: ADD extra units on insulin to your meal-time Humalog dose if your blood sugars are higher than 150. Use the scale below to help guide you:   Blood sugar before meal Number of units to inject  Less than 150 0 unit  151 -  170 1 units  171 -  190 2 units  191 -  210 3 units  211 -  230 4 units  231 -  250 5 units  251 -  270 6 units  271 -  290 7 units  291 -  310 8 units  311 - 330 9 units        HOW TO TREAT LOW BLOOD SUGARS (Blood sugar LESS THAN 70 MG/DL) Please follow the RULE OF 15 for the treatment of hypoglycemia treatment (when your (blood sugars are less than 70 mg/dL)   STEP 1: Take 15 grams of carbohydrates when your blood sugar is low, which includes:  3-4 GLUCOSE TABS  OR 3-4 OZ OF JUICE OR REGULAR SODA OR ONE TUBE OF GLUCOSE GEL    STEP 2: RECHECK blood sugar in 15 MINUTES STEP 3: If your blood sugar is still low at the 15 minute recheck --> then, go back to STEP 1 and treat AGAIN with another 15 grams of carbohydrates.

## 2023-03-17 NOTE — Progress Notes (Signed)
Name: Merlen Kohlhoff  Age/ Sex: 43 y.o., male   MRN/ DOB: [id number], 10/13/1980     PCP: Ivonne Andrew, NP   Reason for Endocrinology Evaluation: Type 2 Diabetes Mellitus  Initial Endocrine Consultative Visit: 07/15/2019    PATIENT IDENTIFIER: Jonathan [name] is a 43 y.o. male with a past medical history of T2DM, hypogonadism. The patient has followed with Endocrinology clinic since 07/15/2019 for consultative assistance with management of his diabetes.  DIABETIC HISTORY:  Mr. Cotrone was diagnosed with DM 2014.  He has been on basal insulin, insulin mix, and Victoza in the past.. His hemoglobin A1c has ranged from 9.8% in 2021, peaking at >15.5% in 2019  He is intolerant to metformin due to diarrhea Marcelline Deist was started August 2022   HYPOGONADISM HISTORY:  He was first diagnosed with hypogonadism in 2016.  He has had multiple testosterone readings with a nadir of 125 NG/DL, as well as low sex hormone binding globulin.  He was on testosterone cypionate at some point   Upon his initial presentation to our clinic his testosterone was 167 NG/DL, with inappropriately normal FSH and LH at 3.4 and 7.54 uIU/mL respectively.  Normal prolactin, TFTs and cortisol  MRI 08/18/2019 did not show any pituitary pathology  He was prescribed clomiphene in July 2021 with improvement of his testosterone levels from 1 55-260 NG/DL  His treatment has been taken over by urology by February 2023  SUBJECTIVE:   During the last visit (09/09/2022): 6.9%    Today (03/17/2023): Mr. Delanuez is here for follow-up on diabetes management.  He checks his blood sugars multiple times daily, through CGM. The patient has had hypoglycemic episodes since the last clinic visit    He continues to follow-up with orthopedics for a left foot ulcer He has not submitted a financial for pt assistance   Denies nausea, vomiting  Denies constipation or diarrhea    HOME DIABETES REGIMEN:  Toujeo 54 units  daily Humalog 10 units with breakfast and Supper- no taking  Ozempic 2 mg weekly (Monday)- not taking  [name] 25 mg daily- pt assistance  Correction factor: Humalog (BG -130/20) Losartan 25 mg daily Atorvastatin 10 mg daily     Statin: Yes ACE-I/ARB: no    CONTINUOUS GLUCOSE MONITORING RECORD INTERPRETATION    Dates of Recording:1/4-1/17/2025  Sensor description: dexcom  Results statistics:   CGM use % of time 96  Average and SD 211/30.3  Time in range 33%  % Time Above 180 40  % Time above 250 27  % Time Below target 0      Glycemic patterns summary: BGs fluctuate throughout the day and night  Hyperglycemic episodes  postprandial   Hypoglycemic episodes occurred during the night and day  Overnight periods: Trends down      DIABETIC COMPLICATIONS: Microvascular complications:  Neuropathy Denies: CKD  Last Eye Exam: Completed 08/30/2022  Macrovascular complications:   Denies: CAD, CVA, PVD   HISTORY:  Past Medical History:  Past Medical History:  Diagnosis Date   Amputation of left great toe (HCC) 01/2019   Anxiety    Decreased libido 04/2019   Diabetes mellitus    Erectile dysfunction 04/2019   Hyperglycemia 09/2019   Low testosterone in male 04/2019   Proteinuria 01/07/2020   Vitamin D deficiency 04/2019   Past Surgical History:  Past Surgical History:  Procedure Laterality Date   AMPUTATION Left 02/02/2018   Procedure: LEFT GREAT TOE AMPUTATION;  Surgeon: Nadara Mustard, MD;  Location: [facility] OR;  Service: Orthopedics;  Laterality: Left;   AMPUTATION Left 02/08/2019   Procedure: LEFT FOOT 1ST RAY AMPUTATION;  Surgeon: Nadara Mustard, MD;  Location: [facility] OR;  Service: Orthopedics;  Laterality: Left;   Social History:  reports that he has never smoked. He has never used smokeless tobacco. He reports current alcohol use of about 2.0 standard drinks of alcohol per week. He reports that he does not use drugs. Family History:  Family History   Problem Relation Age of Onset   Other Mother        Healthy   Other Father        Healthy     HOME MEDICATIONS: Allergies as of 03/17/2023   No Known Allergies      Medication List        Accurate as of March 17, 2023 10:14 AM. If you have any questions, ask your nurse or doctor.          amLODipine 5 MG tablet Commonly known as: NORVASC TAKE 1 TABLET BY MOUTH DAILY   atorvastatin 10 MG tablet Commonly known as: LIPITOR Take 1 tablet (10 mg total) by mouth daily.   Basaglar KwikPen 100 UNIT/ML Inject 54 Units into the skin daily.   blood glucose meter kit and supplies Dispense based on patient and insurance preference. Use up to four times daily as directed. (FOR ICD-10 E10.9, E11.9).   Clomid 50 MG tablet Generic drug: clomiPHENE Take 0.5 tablets (25 mg total) by mouth daily.   gabapentin 300 MG capsule Commonly known as: NEURONTIN Take 1 capsule (300 mg total) by mouth at bedtime.   hydrOXYzine 10 MG tablet Commonly known as: ATARAX Take 1 tablet (10 mg total) by mouth at bedtime as needed.   ibuprofen 800 MG tablet Commonly known as: ADVIL Take 1 tablet (800 mg total) by mouth every 8 (eight) hours as needed.   Insulin Pen Needle 32G X 4 MM Misc 1 Device by Does not apply route in the morning, at noon, in the evening, and at bedtime.   [name] 25 MG Tabs tablet Generic drug: empagliflozin Take 1 tablet (25 mg total) by mouth daily before breakfast.   losartan 25 MG tablet Commonly known as: COZAAR TAKE 1 TABLET BY MOUTH DAILY   NovoLOG FlexPen 100 UNIT/ML FlexPen Generic drug: insulin aspart Max daily 60 units   omeprazole 20 MG capsule Commonly known as: PRILOSEC Take 1 capsule (20 mg total) by mouth daily.   onetouch ultrasoft lancets Use 4 times daily as directed  E11.42, Z79.4   TRUEplus Lancets 28G Misc Use 2 (two) times daily.   OneTouch Verio test strip Generic drug: glucose blood USE AS DIRECTED   True Metrix Blood  Glucose Test test strip Generic drug: glucose blood Use as instructed   OneTouch Verio w/Device Kit Inject 1 Units into the skin QID. Use meter 4 times daily as directed   True Metrix Meter w/Device Kit Use 2 (two) times daily.   oxyCODONE-acetaminophen 5-325 MG tablet Commonly known as: Percocet Take 1-2 tablets by mouth every 4 (four) hours as needed.   Ozempic (2 MG/DOSE) 8 MG/3ML Sopn Generic drug: Semaglutide (2 MG/DOSE) Inject 2 mg into the skin once a week.   pentoxifylline 400 MG CR tablet Commonly known as: TRENTAL Take 1 tablet (400 mg total) by mouth 3 (three) times daily with meals.   tadalafil 5 MG tablet Commonly known as: CIALIS Take 1 tablet (5 mg total) by mouth daily.   Evaristo Bury FlexTouch 200  UNIT/ML FlexTouch Pen Generic drug: insulin degludec Inject 54 Units into the skin daily in the afternoon.         OBJECTIVE:   Vital Signs: BP 130/86 (BP Location: Right Arm, Patient Position: Sitting, Cuff Size: Normal)   Pulse 99   Ht 6\' 8"  (2.032 m)   Wt (!) 331 lb (150.1 kg)   SpO2 99%   BMI 36.36 kg/m   Wt Readings from Last 3 Encounters:  03/17/23 (!) 331 lb (150.1 kg)  11/11/22 290 lb (131.5 kg)  09/09/22 289 lb (131.1 kg)     Exam: General: Pt appears well and is in NAD  Lungs: Clear with good BS bilat with no rales, rhonchi, or wheezes  Heart: RRR   Abdomen: soft, nontender  Extremities: No pretibial edema.   Neuro: MS is good with appropriate affect, pt is alert and Ox3    DM foot exam: 08/30/2022  Per Ortho       DATA REVIEWED:  Lab Results  Component Value Date   HGBA1C 8.7 (A) 03/17/2023   HGBA1C 6.9 (A) 09/09/2022   HGBA1C 6.9 (A) 03/11/2022    Latest Reference Range & Units 09/09/22 10:11  Sodium 134 - 144 mmol/L 143  Potassium 3.5 - 5.2 mmol/L 4.4  Chloride 96 - 106 mmol/L 107 (H)  CO2 20 - 29 mmol/L 23  Glucose 70 - 99 mg/dL 960 (H)  BUN 6 - 24 mg/dL 9  Creatinine 4.54 - 0.98 mg/dL 1.19  Calcium 8.7 - 14.7 mg/dL  9.5  BUN/Creatinine Ratio 9 - 20  8 (L)  eGFR >59 mL/min/1.73 88  Alkaline Phosphatase 44 - 121 IU/L 89  Albumin 4.1 - 5.1 g/dL 4.0 (L)  AST 0 - 40 IU/L 27  ALT 0 - 44 IU/L 41  Total Protein 6.0 - 8.5 g/dL 7.0  Total Bilirubin 0.0 - 1.2 mg/dL <8.2  (H): Data is abnormally high (L): Data is abnormally low    ASSESSMENT / PLAN / RECOMMENDATIONS:   1) Type 2 Diabetes Mellitus, Poorly  controlled, With neuropathic  complications and microalbuminuria- Most recent A1c of 8.7 %. Goal A1c < 7.0 %.     -Patient has been noted worsening glycemic control, he attributes this due to the lack of Ozempic.  He was provided with patient assistance form since losing his health insurance, but he was unable to provide a financial statement, patient will try to send this through MyChart today -He was able to get [name] through patient assistance -Unfortunately he has been using Humalog incorrectly and has been guesstimating on his dose.  He initially stated he has been using correction scale, but this morning he had a hypoglycemic episode, patient stated that he took 15 units of Humalog and did not eat anything.  -I again emphasized the importance of taking 10 units of Humalog with a meal plus using correction scale for hyperglycemia if needed in this morning he should only have use the scale which would end up taking 2 units for hypoglycemia but since he did not eat any meals he should not have taken any additional Humalog doses -No changes to his regimen at this time -In office BG 64 Mg/DL, will of 15 was followed with a repeat BG of 81 Mg/DL  MEDICATIONS: Continue Toujeo 54 units daily Continue Humalog 10 units with breakfast and Supper Continue [name] 25 mg daily Continue correction factor: Humalog (BG -130/20)  EDUCATION / INSTRUCTIONS: BG monitoring instructions: Patient is instructed to check his blood sugars 3  times a day, before meals . Call [facility] if: BG  persistently < 70  I reviewed the Rule of 15 for the treatment of hypoglycemia in detail with the patient. Literature supplied.   2) Diabetic complications:  Eye: Does not have known diabetic retinopathy.  Neuro/ Feet: Does  have known diabetic peripheral neuropathy .  Renal: Patient does not have known baseline CKD. He   is not on an ACEI/ARB at present.    3)Dyslipidemia   -Lipid panel at goal  Medication Continue atorvastatin 10 mg daily   4) Microalbuminuria:  -Elevated MA/CR ratio -Patient tolerating losartan   Medication Continue losartan 25 mg daily    F/U in 6 months   Signed electronically by: Lyndle Herrlich, MD  [facility]  [facility] [address] [place] [place], [place] [address] Phone: [phone] FAX: [phone]   CC: Ivonne Andrew, NP [address] [place] [address] Phone: [phone]  Fax: [phone]  Return to Endocrinology clinic as below: Future Appointments  Date Time Provider Department Center  03/31/2023  9:20 AM Ivonne Andrew, NP [facility] None

## 2023-03-18 LAB — MICROALBUMIN / CREATININE URINE RATIO
Creatinine, Urine: 93 mg/dL (ref 20–320)
Microalb Creat Ratio: 152 mg/g{creat} — ABNORMAL HIGH (ref <30)
Microalb, Ur: 14.1 mg/dL

## 2023-03-20 ENCOUNTER — Other Ambulatory Visit: Payer: Self-pay

## 2023-03-20 ENCOUNTER — Encounter: Payer: Self-pay | Admitting: Internal Medicine

## 2023-03-20 ENCOUNTER — Other Ambulatory Visit: Payer: Self-pay | Admitting: Internal Medicine

## 2023-03-20 MED ORDER — LOSARTAN POTASSIUM 50 MG PO TABS
50.0000 mg | ORAL_TABLET | Freq: Every day | ORAL | 3 refills | Status: DC
  Filled 2023-03-20: qty 90, 90d supply, fill #0
  Filled 2023-04-05 (×2): qty 90, 90d supply, fill #1
  Filled 2023-05-11: qty 90, 90d supply, fill #2

## 2023-03-22 ENCOUNTER — Other Ambulatory Visit: Payer: Self-pay

## 2023-03-31 ENCOUNTER — Encounter: Payer: Self-pay | Admitting: Nurse Practitioner

## 2023-03-31 ENCOUNTER — Ambulatory Visit (INDEPENDENT_AMBULATORY_CARE_PROVIDER_SITE_OTHER): Payer: Self-pay | Admitting: Nurse Practitioner

## 2023-03-31 ENCOUNTER — Other Ambulatory Visit: Payer: Self-pay

## 2023-03-31 ENCOUNTER — Other Ambulatory Visit: Payer: Self-pay | Admitting: Nurse Practitioner

## 2023-03-31 ENCOUNTER — Encounter: Payer: Self-pay | Admitting: Pharmacist

## 2023-03-31 VITALS — BP 144/81 | HR 90 | Temp 97.5°F | Wt 329.2 lb

## 2023-03-31 DIAGNOSIS — Z789 Other specified health status: Secondary | ICD-10-CM

## 2023-03-31 DIAGNOSIS — F5101 Primary insomnia: Secondary | ICD-10-CM

## 2023-03-31 DIAGNOSIS — Z0000 Encounter for general adult medical examination without abnormal findings: Secondary | ICD-10-CM

## 2023-03-31 DIAGNOSIS — Z23 Encounter for immunization: Secondary | ICD-10-CM

## 2023-03-31 DIAGNOSIS — Z1159 Encounter for screening for other viral diseases: Secondary | ICD-10-CM

## 2023-03-31 DIAGNOSIS — Z111 Encounter for screening for respiratory tuberculosis: Secondary | ICD-10-CM

## 2023-03-31 DIAGNOSIS — Z9229 Personal history of other drug therapy: Secondary | ICD-10-CM

## 2023-03-31 MED ORDER — TADALAFIL 5 MG PO TABS
5.0000 mg | ORAL_TABLET | Freq: Every day | ORAL | 0 refills | Status: DC
  Filled 2023-03-31 – 2023-04-05 (×3): qty 30, 30d supply, fill #0

## 2023-03-31 MED ORDER — HYDROXYZINE HCL 10 MG PO TABS
10.0000 mg | ORAL_TABLET | Freq: Every evening | ORAL | 0 refills | Status: DC | PRN
  Filled 2023-03-31 – 2023-04-05 (×3): qty 30, 30d supply, fill #0

## 2023-03-31 NOTE — Progress Notes (Signed)
Subjective   Patient ID: Jonathan [name], male    DOB: April 25, 1980, 43 y.o.   MRN: [id number]  Chief Complaint  Patient presents with   Diabetes    130 fasting per patient     Referring provider: Ivonne Andrew, NP  Jonathan [name] is a 43 y.o. male with Past Medical History: 01/2019: Amputation of left great toe (HCC) No date: Anxiety 04/2019: Decreased libido No date: Diabetes mellitus 04/2019: Erectile dysfunction 09/2019: Hyperglycemia 04/2019: Low testosterone in male 01/07/2020: Proteinuria 04/2019: Vitamin D deficiency   HPI  Patient presents today for a follow-up visit.  He has been followed by endocrinology for diabetes. Overall things have been going well for him he is compliant with medications.  He states that he is compliant with his blood pressure medications and atorvastatin.  Blood pressure elevated in office today.  Patient states that he took his medications just before entering the office. Denies f/c/s, n/v/d, hemoptysis, PND, leg swelling Denies chest pain or edema.  Patient does need titers for immunity for his new job.  He does need TB test. We will order these today. Patient does not know if he had childhood immunizations.     No Known Allergies  Immunization History  Administered Date(s) Administered   Influenza, Seasonal, Injecte, Preservative Fre 03/31/2023   Influenza,inj,Quad PF,6+ Mos 01/08/2015, 02/16/2018, 04/29/2019, 01/07/2020, 01/07/2021   Moderna Sars-Covid-2 Vaccination 08/08/2019, 09/04/2020   Pneumococcal Conjugate-13 01/07/2020   Tdap 09/11/2014    Tobacco History: Social History   Tobacco Use  Smoking Status Never  Smokeless Tobacco Never   Counseling given: Not Answered   Outpatient Encounter Medications as of 03/31/2023  Medication Sig   amLODipine (NORVASC) 5 MG tablet TAKE 1 TABLET BY MOUTH DAILY   blood glucose meter kit and supplies Dispense based on patient and insurance preference. Use up to four times daily  as directed. (FOR ICD-10 E10.9, E11.9).   Blood Glucose Monitoring Suppl (TRUE METRIX METER) w/Device KIT Use 2 (two) times daily.   empagliflozin ([name]) 25 MG TABS tablet Take 1 tablet (25 mg total) by mouth daily before breakfast.   glucose blood (GNP TRUE METRIX GLUCOSE STRIPS) test strip Use as instructed   hydrOXYzine (ATARAX) 10 MG tablet Take 1 tablet (10 mg total) by mouth at bedtime as needed.   ibuprofen (ADVIL) 800 MG tablet Take 1 tablet (800 mg total) by mouth every 8 (eight) hours as needed.   insulin aspart (NOVOLOG FLEXPEN) 100 UNIT/ML FlexPen Max daily 60 units   Insulin Glargine (BASAGLAR KWIKPEN) 100 UNIT/ML Inject 54 Units into the skin daily.   Insulin Pen Needle 32G X 4 MM MISC 1 Device by Does not apply route in the morning, at noon, in the evening, and at bedtime.   Lancets (ONETOUCH ULTRASOFT) lancets Use 4 times daily as directed  E11.42, Z79.4   losartan (COZAAR) 50 MG tablet Take 1 tablet (50 mg total) by mouth daily.   omeprazole (PRILOSEC) 20 MG capsule Take 1 capsule (20 mg total) by mouth daily.   ONETOUCH VERIO test strip USE AS DIRECTED   pentoxifylline (TRENTAL) 400 MG CR tablet Take 1 tablet (400 mg total) by mouth 3 (three) times daily with meals.   Semaglutide, 2 MG/DOSE, (OZEMPIC, 2 MG/DOSE,) 8 MG/3ML SOPN Inject 2 mg into the skin once a week.   tadalafil (CIALIS) 5 MG tablet Take 1 tablet (5 mg total) by mouth daily.   TRUEplus Lancets 30G MISC Use 2 (two) times daily.   atorvastatin (  LIPITOR) 10 MG tablet Take 1 tablet (10 mg total) by mouth daily. (Patient not taking: Reported on 03/31/2023)   clomiPHENE (CLOMID) 50 MG tablet Take 0.5 tablets (25 mg total) by mouth daily. (Patient not taking: Reported on 03/31/2023)   gabapentin (NEURONTIN) 300 MG capsule Take 1 capsule (300 mg total) by mouth at bedtime. (Patient not taking: Reported on 03/31/2023)   insulin degludec (TRESIBA FLEXTOUCH) 200 UNIT/ML FlexTouch Pen Inject 54 Units into the skin daily in  the afternoon. (Patient not taking: Reported on 03/31/2023)   oxyCODONE-acetaminophen (PERCOCET) 5-325 MG tablet Take 1-2 tablets by mouth every 4 (four) hours as needed. (Patient not taking: Reported on 03/31/2023)   No facility-administered encounter medications on file as of 03/31/2023.    Review of Systems  Review of Systems  Constitutional: Negative.   HENT: Negative.    Cardiovascular: Negative.   Gastrointestinal: Negative.   Allergic/Immunologic: Negative.   Neurological: Negative.   Psychiatric/Behavioral: Negative.       Objective:   BP (!) 144/81   Pulse 90   Temp (!) 97.5 F (36.4 C)   Wt (!) 329 lb 3.2 oz (149.3 kg)   SpO2 100%   BMI 36.16 kg/m   Wt Readings from Last 5 Encounters:  03/31/23 (!) 329 lb 3.2 oz (149.3 kg)  03/17/23 (!) 331 lb (150.1 kg)  11/11/22 290 lb (131.5 kg)  09/09/22 289 lb (131.1 kg)  09/09/22 289 lb 9.6 oz (131.4 kg)     Physical Exam Vitals and nursing note reviewed.  Constitutional:      General: He is not in acute distress.    Appearance: He is well-developed.  Cardiovascular:     Rate and Rhythm: Normal rate and regular rhythm.  Pulmonary:     Effort: Pulmonary effort is normal.     Breath sounds: Normal breath sounds.  Skin:    General: Skin is warm and dry.  Neurological:     Mental Status: He is alert and oriented to person, place, and time.       Assessment & Plan:   Routine health maintenance -     Flu vaccine trivalent PF, 6mos and older(Flulaval,Afluria,Fluarix,Fluzone) -     CBC -     Comprehensive metabolic panel  Screening-pulmonary TB -     QuantiFERON-TB Gold Plus  Need for hepatitis B screening test -     HepB+HepC+HIV Panel  Need for hepatitis C screening test -     HepB+HepC+HIV Panel  History of MMR vaccination -     Measles/Mumps/Rubella Immunity  Varicella vaccination status unknown -     Varicella zoster antibody, IgG     Return in about 6 months (around 09/28/2023).     Ivonne Andrew, NP 03/31/2023

## 2023-03-31 NOTE — Telephone Encounter (Signed)
Pleases advise [facility]

## 2023-03-31 NOTE — Patient Instructions (Signed)
1. Routine health maintenance (Primary)  - Flu vaccine trivalent PF, 6mos and older(Flulaval,Afluria,Fluarix,Fluzone) - CBC - Comprehensive metabolic panel  2. Screening-pulmonary TB  - QuantiFERON-TB Gold Plus  3. Need for hepatitis B screening test  - HepB+HepC+HIV Panel  4. Need for hepatitis C screening test  - HepB+HepC+HIV Panel  5. History of MMR vaccination  - Measles/Mumps/Rubella Immunity  6. Varicella vaccination status unknown  - Varicella zoster antibody, IgG  Follow up:  Follow up in 6 months

## 2023-04-05 ENCOUNTER — Other Ambulatory Visit: Payer: Self-pay | Admitting: Nurse Practitioner

## 2023-04-05 ENCOUNTER — Other Ambulatory Visit: Payer: Self-pay

## 2023-04-05 ENCOUNTER — Other Ambulatory Visit (HOSPITAL_COMMUNITY): Payer: Self-pay

## 2023-04-06 ENCOUNTER — Other Ambulatory Visit: Payer: Self-pay

## 2023-04-06 LAB — COMPREHENSIVE METABOLIC PANEL
ALT: 35 [IU]/L (ref 0–44)
AST: 31 [IU]/L (ref 0–40)
Albumin: 4.4 g/dL (ref 4.1–5.1)
Alkaline Phosphatase: 104 [IU]/L (ref 44–121)
BUN/Creatinine Ratio: 8 — ABNORMAL LOW (ref 9–20)
BUN: 9 mg/dL (ref 6–24)
Bilirubin Total: 0.3 mg/dL (ref 0.0–1.2)
CO2: 19 mmol/L — ABNORMAL LOW (ref 20–29)
Calcium: 9.8 mg/dL (ref 8.7–10.2)
Chloride: 108 mmol/L — ABNORMAL HIGH (ref 96–106)
Creatinine, Ser: 1.11 mg/dL (ref 0.76–1.27)
Globulin, Total: 3.1 g/dL (ref 1.5–4.5)
Glucose: 115 mg/dL — ABNORMAL HIGH (ref 70–99)
Potassium: 4.1 mmol/L (ref 3.5–5.2)
Sodium: 147 mmol/L — ABNORMAL HIGH (ref 134–144)
Total Protein: 7.5 g/dL (ref 6.0–8.5)
eGFR: 86 mL/min/{1.73_m2} (ref >59)

## 2023-04-06 LAB — QUANTIFERON-TB GOLD PLUS
QuantiFERON Nil Value: 0.12 [IU]/mL
QuantiFERON TB1 Ag Value: 0.15 [IU]/mL
QuantiFERON TB2 Ag Value: 0.16 [IU]/mL

## 2023-04-06 LAB — CBC
Hematocrit: 46.8 % (ref 37.5–51.0)
Hemoglobin: 14.6 g/dL (ref 13.0–17.7)
MCH: 24.7 pg — ABNORMAL LOW (ref 26.6–33.0)
MCHC: 31.2 g/dL — ABNORMAL LOW (ref 31.5–35.7)
MCV: 79 fL (ref 79–97)
Platelets: 150 10*3/uL (ref 150–450)
RBC: 5.91 x10E6/uL — ABNORMAL HIGH (ref 4.14–5.80)
RDW: 14.1 % (ref 11.6–15.4)
WBC: 4.3 10*3/uL (ref 3.4–10.8)

## 2023-04-06 LAB — HEPB+HEPC+HIV PANEL: HIV Screen 4th Generation wRfx: NONREACTIVE

## 2023-04-06 LAB — MEASLES/MUMPS/RUBELLA IMMUNITY
MUMPS ABS, IGG: 55.3 [AU]/ml (ref >10.9)
Rubella Antibodies, IGG: 20.2 {index} (ref >0.99)

## 2023-04-06 LAB — VARICELLA ZOSTER ANTIBODY, IGG

## 2023-04-24 ENCOUNTER — Other Ambulatory Visit: Payer: Self-pay | Admitting: Internal Medicine

## 2023-04-24 ENCOUNTER — Other Ambulatory Visit: Payer: Self-pay

## 2023-04-24 MED ORDER — NOVOLOG FLEXPEN 100 UNIT/ML ~~LOC~~ SOPN
PEN_INJECTOR | SUBCUTANEOUS | 2 refills | Status: DC
  Filled 2023-04-24: qty 60, 100d supply, fill #0
  Filled 2023-04-26: qty 15, 25d supply, fill #0

## 2023-04-26 ENCOUNTER — Other Ambulatory Visit: Payer: Self-pay

## 2023-05-11 ENCOUNTER — Other Ambulatory Visit: Payer: Self-pay

## 2023-05-11 ENCOUNTER — Other Ambulatory Visit: Payer: Self-pay | Admitting: Nurse Practitioner

## 2023-05-11 MED ORDER — CLOMIPHENE CITRATE 50 MG PO TABS
25.0000 mg | ORAL_TABLET | Freq: Every day | ORAL | 3 refills | Status: DC
  Filled 2023-05-12: qty 15, 30d supply, fill #0

## 2023-05-11 MED ORDER — TADALAFIL 5 MG PO TABS
5.0000 mg | ORAL_TABLET | Freq: Every day | ORAL | 0 refills | Status: DC
  Filled 2023-05-11 – 2023-05-12 (×2): qty 30, 30d supply, fill #0

## 2023-05-12 ENCOUNTER — Other Ambulatory Visit: Payer: Self-pay

## 2023-05-12 ENCOUNTER — Telehealth: Payer: Self-pay | Admitting: Internal Medicine

## 2023-05-12 MED ORDER — SEMAGLUTIDE(0.25 OR 0.5MG/DOS) 2 MG/3ML ~~LOC~~ SOPN
0.5000 mg | PEN_INJECTOR | SUBCUTANEOUS | 2 refills | Status: DC
  Filled 2023-05-12: qty 3, 28d supply, fill #0

## 2023-05-12 NOTE — Telephone Encounter (Signed)
 MEDICATION: OZEMPIC, 2 MG/DOSE   PHARMACY:  [facility]  HAS THE PATIENT CONTACTED THEIR PHARMACY?  YES  IS THIS A 90 DAY SUPPLY :   IS PATIENT OUT OF MEDICATION: YES, took last   IF NOT; HOW MUCH IS LEFT:   LAST APPOINTMENT DATE: @2 /24/2025  NEXT APPOINTMENT DATE:@5 /19/2025  DO WE HAVE YOUR PERMISSION TO LEAVE A DETAILED MESSAGE?:  OTHER COMMENTS:    **Let patient know to contact pharmacy at the end of the day to make sure medication is ready. **  ** Please notify patient to allow 48-72 hours to process**  **Encourage patient to contact the pharmacy for refills or they can request refills through [facility]**

## 2023-05-12 NOTE — Telephone Encounter (Signed)
 Patient can not afford the 2 mg dosing and so he would rather at least take the starting dose of 0.25/0.5 mg   New script has been sent down stairs

## 2023-05-15 ENCOUNTER — Other Ambulatory Visit: Payer: Self-pay

## 2023-05-17 ENCOUNTER — Other Ambulatory Visit: Payer: Self-pay

## 2023-05-19 ENCOUNTER — Other Ambulatory Visit: Payer: Self-pay

## 2023-05-24 ENCOUNTER — Other Ambulatory Visit: Payer: Self-pay | Admitting: Internal Medicine

## 2023-06-09 ENCOUNTER — Other Ambulatory Visit: Payer: Self-pay

## 2023-06-09 ENCOUNTER — Other Ambulatory Visit: Payer: Self-pay | Admitting: Nurse Practitioner

## 2023-06-09 DIAGNOSIS — I10 Essential (primary) hypertension: Secondary | ICD-10-CM

## 2023-06-09 DIAGNOSIS — K219 Gastro-esophageal reflux disease without esophagitis: Secondary | ICD-10-CM

## 2023-06-09 MED ORDER — NOVOLOG FLEXPEN 100 UNIT/ML ~~LOC~~ SOPN: PEN_INJECTOR | SUBCUTANEOUS | 2 refills | Status: DC

## 2023-06-09 MED ORDER — PENTOXIFYLLINE ER 400 MG PO TBCR: 400.0000 mg | EXTENDED_RELEASE_TABLET | Freq: Three times a day (TID) | ORAL | 3 refills | Status: DC

## 2023-06-09 MED ORDER — SEMAGLUTIDE(0.25 OR 0.5MG/DOS) 2 MG/3ML ~~LOC~~ SOPN: 0.5000 mg | PEN_INJECTOR | SUBCUTANEOUS | 2 refills | Status: DC

## 2023-06-09 MED ORDER — BASAGLAR KWIKPEN 100 UNIT/ML ~~LOC~~ SOPN: 54.0000 [IU] | PEN_INJECTOR | Freq: Every day | SUBCUTANEOUS | 4 refills | Status: DC

## 2023-06-09 MED ORDER — OMEPRAZOLE 20 MG PO CPDR: 20.0000 mg | DELAYED_RELEASE_CAPSULE | Freq: Every day | ORAL | 3 refills | Status: DC

## 2023-06-09 MED ORDER — TADALAFIL 5 MG PO TABS: 5.0000 mg | ORAL_TABLET | Freq: Every day | ORAL | 0 refills | Status: DC

## 2023-06-09 NOTE — Telephone Encounter (Signed)
 Pt has remaining refills on amlodipine, clomiphene, insulin, jardiance, losartan, trental, semaglutide.

## 2023-06-09 NOTE — Telephone Encounter (Signed)
 Copied from CRM [id number]. Topic: Clinical - Medication Refill >> Jun 09, 2023 12:22 PM Jonathan [name] wrote: Most Recent Primary Care Visit:  Provider: Ivonne Andrew  Department: [facility]  Visit Type: OFFICE VISIT  Date: 03/31/2023  Medication: amLODipine (NORVASC) 5 MG tablet clomiPHENE (CLOMID) 50 MG tablet insulin aspart (NOVOLOG FLEXPEN) 100 UNIT/ML FlexPen Insulin Glargine (BASAGLAR KWIKPEN) 100 UNIT/ML [name] 25 MG TABS tablet losartan (COZAAR) 50 MG tablet omeprazole (PRILOSEC) 20 MG capsule pentoxifylline (TRENTAL) 400 MG CR tablet Semaglutide, 2 MG/DOSE, (OZEMPIC, 2 MG/DOSE,) 8 MG/3ML SOPN tadalafil (CIALIS) 5 MG tablet  Has the patient contacted their pharmacy? Yes (Agent: If no, request that the patient contact the pharmacy for the refill. If patient does not wish to contact the pharmacy document the reason why and proceed with request.) (Agent: If yes, when and what did the pharmacy advise?)  Is this the correct pharmacy for this prescription? Yes If no, delete pharmacy and type the correct one.  This is the patient's preferred pharmacy:    [facility]  Has the prescription been filled recently? Yes  Is the patient out of the medication? Yes  Has the patient been seen for an appointment in the last year OR does the patient have an upcoming appointment? Yes  Can we respond through MyChart? Yes  Agent: Please be advised that Rx refills may take up to 3 business days. We ask that you follow-up with your pharmacy.

## 2023-06-09 NOTE — Telephone Encounter (Signed)
 Patient aware that  Microalbumin / creatinine urine ratio  results  had not been calculated correctly on  03/02/22 by [facility]. . Per Dr. Lonzo Cloud there is no action needed at this time and the test will be repeat at your next office visit.

## 2023-06-09 NOTE — Telephone Encounter (Signed)
 Please advise [facility]

## 2023-06-13 ENCOUNTER — Telehealth: Payer: Self-pay

## 2023-06-13 MED ORDER — INSULIN LISPRO (1 UNIT DIAL) 100 UNIT/ML (KWIKPEN): PEN_INJECTOR | SUBCUTANEOUS | 3 refills | Status: DC

## 2023-06-13 MED ORDER — INSULIN GLARGINE-YFGN 100 UNIT/ML ~~LOC~~ SOPN: 54.0000 [IU] | PEN_INJECTOR | Freq: Every day | SUBCUTANEOUS | 3 refills | Status: DC

## 2023-06-13 NOTE — Telephone Encounter (Signed)
 Received a call from [facility]  Stating this patient insurance does not cover Basaglar or Novolog. They will cover Humalog and Semglee ok to change?

## 2023-06-19 ENCOUNTER — Telehealth: Payer: Self-pay

## 2023-06-19 MED ORDER — TIRZEPATIDE 5 MG/0.5ML ~~LOC~~ SOAJ: 5.0000 mg | SUBCUTANEOUS | 3 refills | Status: DC

## 2023-06-19 NOTE — Telephone Encounter (Signed)
 Insurance does not cover ozempic . The pharmacy states that metformin  ER is covered. Asking for us  to look into changing medication.

## 2023-06-23 ENCOUNTER — Telehealth: Payer: Self-pay

## 2023-06-23 ENCOUNTER — Other Ambulatory Visit: Payer: Self-pay

## 2023-06-23 ENCOUNTER — Other Ambulatory Visit (HOSPITAL_COMMUNITY): Payer: Self-pay

## 2023-06-23 MED ORDER — FREESTYLE LIBRE 3 PLUS SENSOR MISC: 3 refills | Status: AC

## 2023-06-23 NOTE — Telephone Encounter (Signed)
 Prescription has been sent for the sensors

## 2023-06-23 NOTE — Telephone Encounter (Signed)
 Ozempic  has been discontinued.

## 2023-06-23 NOTE — Telephone Encounter (Signed)
Ozempic needs PA  

## 2023-06-26 ENCOUNTER — Other Ambulatory Visit (HOSPITAL_COMMUNITY): Payer: Self-pay

## 2023-06-26 ENCOUNTER — Telehealth: Payer: Self-pay

## 2023-06-26 NOTE — Telephone Encounter (Signed)
 Pharmacy Patient Advocate Encounter   Received notification from Pt Calls Messages that prior authorization for Mounjaro is required/requested.   Insurance verification completed.   The patient is insured through [employer] .   Per test claim: PA required; PA submitted to above mentioned insurance via CoverMyMeds Key/confirmation #/EOC [facility] Status is pending

## 2023-07-04 NOTE — Telephone Encounter (Signed)
 Pharmacy Patient Advocate Encounter  Received notification from [facility] that Prior Authorization for Jonathan [name] has been APPROVED through 06/25/24   PA #/Case ID/Reference #: [id number]

## 2023-07-17 ENCOUNTER — Ambulatory Visit: Payer: Self-pay | Admitting: Internal Medicine

## 2023-07-17 ENCOUNTER — Telehealth: Payer: Self-pay

## 2023-07-17 NOTE — Progress Notes (Deleted)
 Name: Jonathan [name]  Age/ Sex: 43 y.o., male   MRN/ DOB: [id number], 01/25/1981     PCP: Jonathan Morel, NP   Reason for Endocrinology Evaluation: Type 2 Diabetes Mellitus  Initial Endocrine Consultative Visit: 07/15/2019    PATIENT IDENTIFIER: [name] is a 43 y.o. male with a past medical history of T2DM, hypogonadism. The patient has followed with Endocrinology clinic since 07/15/2019 for consultative assistance with management of his diabetes.  DIABETIC HISTORY:  [name] was diagnosed with DM 2014.  He has been on basal insulin , insulin  mix, and Victoza  in the past.. His hemoglobin A1c has ranged from 9.8% in 2021, peaking at >15.5% in 2019  He is intolerant to metformin  due to diarrhea Farxiga  was started August 2022   HYPOGONADISM HISTORY:  He was first diagnosed with hypogonadism in 2016.  He has had multiple testosterone  readings with a nadir of 125 NG/DL, as well as low sex hormone binding globulin.  He was on testosterone  cypionate at some point   Upon his initial presentation to our clinic his testosterone  was 167 NG/DL, with inappropriately normal FSH and LH at 3.4 and 7.54 uIU/mL respectively.  Normal prolactin, TFTs and cortisol  MRI 08/18/2019 did not show any pituitary pathology  He was prescribed clomiphene  in July 2021 with improvement of his testosterone  levels from 1 55-260 NG/DL  His treatment has been taken over by urology by February 2023  SUBJECTIVE:   During the last visit (03/17/2023): 8.7%    Today (07/17/2023): [name] is here for follow-up on diabetes management.  He checks his blood sugars multiple times daily, through CGM. The patient has had hypoglycemic episodes since the last clinic visit    He continues to follow-up with orthopedics for a left foot ulcer He has not submitted a financial for pt assistance   Denies nausea, vomiting  Denies constipation or diarrhea    HOME DIABETES REGIMEN:  Semglee  54 units  daily Humalog  10 units with breakfast and Supper Mounjaro 5 mg weekly  [name]  25 mg daily- pt assistance  Correction factor: Humalog  (BG -130/20) Losartan  50 mg daily Atorvastatin  10 mg daily     Statin: Yes ACE-I/ARB: no    CONTINUOUS GLUCOSE MONITORING RECORD INTERPRETATION    Dates of Recording:1/4-1/17/2025  Sensor description: dexcom  Results statistics:   CGM use % of time 96  Average and SD 211/30.3  Time in range 33%  % Time Above 180 40  % Time above 250 27  % Time Below target 0      Glycemic patterns summary: BGs fluctuate throughout the day and night  Hyperglycemic episodes  postprandial   Hypoglycemic episodes occurred during the night and day  Overnight periods: Trends down      DIABETIC COMPLICATIONS: Microvascular complications:  Neuropathy Denies: CKD  Last Eye Exam: Completed 08/30/2022  Macrovascular complications:   Denies: CAD, CVA, PVD   HISTORY:  Past Medical History:  Past Medical History:  Diagnosis Date   Amputation of left great toe (HCC) 01/2019   Anxiety    Decreased libido 04/2019   Diabetes mellitus    Erectile dysfunction 04/2019   Hyperglycemia 09/2019   Low testosterone  in male 04/2019   Proteinuria 01/07/2020   Vitamin D  deficiency 04/2019   Past Surgical History:  Past Surgical History:  Procedure Laterality Date   AMPUTATION Left 02/02/2018   Procedure: LEFT GREAT TOE AMPUTATION;  Surgeon: Timothy Ford, MD;  Location: [facility] OR;  Service: Orthopedics;  Laterality: Left;  AMPUTATION Left 02/08/2019   Procedure: LEFT FOOT 1ST RAY AMPUTATION;  Surgeon: Timothy Ford, MD;  Location: [facility] OR;  Service: Orthopedics;  Laterality: Left;   Social History:  reports that he has never smoked. He has never used smokeless tobacco. He reports current alcohol use of about 2.0 standard drinks of alcohol per week. He reports that he does not use drugs. Family History:  Family History  Problem Relation Age of Onset    Other Mother        Healthy   Other Father        Healthy     HOME MEDICATIONS: Allergies as of 07/17/2023   No Known Allergies      Medication List        Accurate as of Jul 17, 2023  7:17 AM. If you have any questions, ask your nurse or doctor.          amLODipine  5 MG tablet Commonly known as: NORVASC  TAKE 1 TABLET BY MOUTH DAILY   atorvastatin  10 MG tablet Commonly known as: LIPITOR Take 1 tablet (10 mg total) by mouth daily.   blood glucose meter kit and supplies Dispense based on patient and insurance preference. Use up to four times daily as directed. (FOR ICD-10 E10.9, E11.9).   Clomid  50 MG tablet Generic drug: clomiPHENE  Take 0.5 tablets (25 mg total) by mouth daily.   FreeStyle Libre 3 Plus Sensor Misc Change sensor every 15 days.   gabapentin  300 MG capsule Commonly known as: NEURONTIN  Take 1 capsule (300 mg total) by mouth at bedtime.   hydrOXYzine  10 MG tablet Commonly known as: ATARAX  Take 1 tablet (10 mg total) by mouth at bedtime as needed.   ibuprofen  800 MG tablet Commonly known as: ADVIL  Take 1 tablet (800 mg total) by mouth every 8 (eight) hours as needed.   insulin  glargine-yfgn 100 UNIT/ML Pen Commonly known as: SEMGLEE  Inject 54 Units into the skin daily.   insulin  lispro 100 UNIT/ML KwikPen Commonly known as: HumaLOG  KwikPen Max daily 45 unit   Insulin  Pen Needle 32G X 4 MM Misc 1 Device by Does not apply route in the morning, at noon, in the evening, and at bedtime.   [name]  25 MG Tabs tablet Generic drug: empagliflozin  TAKE ONE TABLET BY MOUTH DAILY   losartan  50 MG tablet Commonly known as: COZAAR  Take 1 tablet (50 mg total) by mouth daily.   omeprazole  20 MG capsule Commonly known as: PRILOSEC Take 1 capsule (20 mg total) by mouth daily.   onetouch ultrasoft lancets Use 4 times daily as directed  E11.42, Z79.4   TRUEplus Lancets 28G Misc Use 2 (two) times daily.   OneTouch Verio test strip Generic drug:  glucose blood USE AS DIRECTED   True Metrix Blood Glucose Test test strip Generic drug: glucose blood Use as instructed   oxyCODONE -acetaminophen  5-325 MG tablet Commonly known as: Percocet Take 1-2 tablets by mouth every 4 (four) hours as needed.   pentoxifylline  400 MG CR tablet Commonly known as: TRENTAL  Take 1 tablet (400 mg total) by mouth 3 (three) times daily with meals.   tadalafil  5 MG tablet Commonly known as: CIALIS  Take 1 tablet (5 mg total) by mouth daily.   tirzepatide 5 MG/0.5ML Pen Commonly known as: MOUNJARO Inject 5 mg into the skin once a week.   True Metrix Meter w/Device Kit Use 2 (two) times daily.         OBJECTIVE:   Vital Signs: There were no vitals  taken for this visit.  Wt Readings from Last 3 Encounters:  03/31/23 (!) 329 lb 3.2 oz (149.3 kg)  03/17/23 (!) 331 lb (150.1 kg)  11/11/22 290 lb (131.5 kg)     Exam: General: Pt appears well and is in NAD  Lungs: Clear with good BS bilat with no rales, rhonchi, or wheezes  Heart: RRR   Abdomen: soft, nontender  Extremities: No pretibial edema.   Neuro: MS is good with appropriate affect, pt is alert and Ox3    DM foot exam: 08/30/2022  Per Ortho       DATA REVIEWED:  Lab Results  Component Value Date   HGBA1C 8.7 (A) 03/17/2023   HGBA1C 6.9 (A) 09/09/2022   HGBA1C 6.9 (A) 03/11/2022    Latest Reference Range & Units 03/31/23 10:01  Sodium 134 - 144 mmol/L 147 (H)  Potassium 3.5 - 5.2 mmol/L 4.1  Chloride 96 - 106 mmol/L 108 (H)  CO2 20 - 29 mmol/L 19 (L)  Glucose 70 - 99 mg/dL 161 (H)  BUN 6 - 24 mg/dL 9  Creatinine 0.96 - 0.45 mg/dL 4.09  Calcium  8.7 - 10.2 mg/dL 9.8  BUN/Creatinine Ratio 9 - 20  8 (L)  eGFR >59 mL/min/1.73 86  Alkaline Phosphatase 44 - 121 IU/L 104  Albumin 4.1 - 5.1 g/dL 4.4  AST 0 - 40 IU/L 31  ALT 0 - 44 IU/L 35  Total Protein 6.0 - 8.5 g/dL 7.5  Total Bilirubin 0.0 - 1.2 mg/dL 0.3    Latest Reference Range & Units 03/17/23 10:49  Microalb,  Ur mg/dL 81.1  MICROALB/CREAT RATIO <30 mg/g creat 152 (H)  Creatinine, Urine 20 - 320 mg/dL 93  (H): Data is abnormally high   ASSESSMENT / PLAN / RECOMMENDATIONS:   1) Type 2 Diabetes Mellitus, Poorly  controlled, With neuropathic  complications and microalbuminuria- Most recent A1c of 8.7 %. Goal A1c < 7.0 %.     -Patient has been noted worsening glycemic control, he attributes this due to the lack of Ozempic .  He was provided with patient assistance form since losing his health insurance, but he was unable to provide a financial statement, patient will try to send this through MyChart today -He was able to get [name]  through patient assistance -Unfortunately he has been using Humalog  incorrectly and has been guesstimating on his dose.  He initially stated he has been using correction scale, but this morning he had a hypoglycemic episode, patient stated that he took 15 units of Humalog  and did not eat anything.  -I again emphasized the importance of taking 10 units of Humalog  with a meal plus using correction scale for hyperglycemia if needed in this morning he should only have use the scale which would end up taking 2 units for hypoglycemia but since he did not eat any meals he should not have taken any additional Humalog  doses -No changes to his regimen at this time -In office BG 64 Mg/DL, will of 15 was followed with a repeat BG of 81 Mg/DL  MEDICATIONS: Continue Toujeo  54 units daily Continue Humalog  10 units with breakfast and Supper Continue [name]  25 mg daily Continue correction factor: Humalog  (BG -130/20)  EDUCATION / INSTRUCTIONS: BG monitoring instructions: Patient is instructed to check his blood sugars 3 times a day, before meals . Call [facility] if: BG persistently < 70  I reviewed the Rule of 15 for the treatment of hypoglycemia in detail with the patient. Literature supplied.   2) Diabetic  complications:  Eye: Does not have known diabetic  retinopathy.  Neuro/ Feet: Does  have known diabetic peripheral neuropathy .  Renal: Patient does not have known baseline CKD. He   is not on an ACEI/ARB at present.    3)Dyslipidemia   -Lipid panel at goal  Medication Continue atorvastatin  10 mg daily   4) Microalbuminuria:  -Elevated MA/CR ratio -Patient tolerating losartan    Medication Continue losartan  25 mg daily    F/U in 6 months   Signed electronically by: Natale Bail, MD  [facility] Endocrinology  [facility] [address] [place] [place], [place] [address] Phone: [phone] FAX: [phone]   CC: Jonathan Morel, NP [address] [place] [address] Phone: [phone]  Fax: [phone]  Return to Endocrinology clinic as below: Future Appointments  Date Time Provider Department Center  07/17/2023 10:50 AM Judas Mohammad, Julian Obey, MD [facility] None  09/28/2023  9:00 AM Jonathan Morel, NP [facility] None

## 2023-07-25 NOTE — Telephone Encounter (Signed)
 Done

## 2023-08-16 ENCOUNTER — Other Ambulatory Visit: Payer: Self-pay

## 2023-09-28 ENCOUNTER — Ambulatory Visit: Payer: Self-pay | Admitting: Nurse Practitioner

## 2023-10-02 ENCOUNTER — Other Ambulatory Visit: Payer: Self-pay

## 2023-10-10 ENCOUNTER — Other Ambulatory Visit (HOSPITAL_COMMUNITY): Payer: Self-pay

## 2024-03-01 ENCOUNTER — Other Ambulatory Visit: Payer: Self-pay | Admitting: Nurse Practitioner

## 2024-03-01 DIAGNOSIS — K219 Gastro-esophageal reflux disease without esophagitis: Secondary | ICD-10-CM

## 2024-03-01 DIAGNOSIS — I10 Essential (primary) hypertension: Secondary | ICD-10-CM

## 2024-03-01 DIAGNOSIS — Z794 Long term (current) use of insulin: Secondary | ICD-10-CM

## 2024-03-01 DIAGNOSIS — K05219 Aggressive periodontitis, localized, unspecified severity: Secondary | ICD-10-CM

## 2024-03-01 DIAGNOSIS — F5101 Primary insomnia: Secondary | ICD-10-CM

## 2024-03-01 NOTE — Telephone Encounter (Signed)
 Copied from CRM [id number]. Topic: Clinical - Medication Refill >> Mar 01, 2024  4:46 PM [name] wrote: Medication:  amLODipine  (NORVASC ) 5 MG tablet atorvastatin  (LIPITOR) 10 MG tablet clomiPHENE  (CLOMID ) 50 MG tablet hydrOXYzine  (ATARAX ) 10 MG tablet ibuprofen  (ADVIL ) 800 MG tablet insulin  glargine-yfgn (SEMGLEE ) 100 UNIT/ML Pen insulin  lispro (HUMALOG  KWIKPEN) 100 UNIT/ML KwikPen [name]  25 MG TABS tablet losartan  (COZAAR ) 50 MG tablet omeprazole  (PRILOSEC) 20 MG capsule pentoxifylline  (TRENTAL ) 400 MG CR tablet  tadalafil  (CIALIS ) 5 MG tablet tirzepatide  (MOUNJARO ) 5 MG/0.5ML Pen TRUEplus Lancets 30G MISC Has the patient contacted their pharmacy? No (Agent: If no, request that the patient contact the pharmacy for the refill. If patient does not wish to contact the pharmacy document the reason why and proceed with request.) (Agent: If yes, when and what did the pharmacy advise?)  This is the patient's preferred pharmacy:  [facility]  [address] [place], [place] [address] Phone: [phone]  Is this the correct pharmacy for this prescription? Yes If no, delete pharmacy and type the correct one.   Has the prescription been filled recently? Yes  Is the patient out of the medication? Yes  Has the patient been seen for an appointment in the last year OR does the patient have an upcoming appointment? Yes  Can we respond through MyChart? No  Agent: Please be advised that Rx refills may take up to 3 business days. We ask that you follow-up with your pharmacy.

## 2024-03-01 NOTE — Telephone Encounter (Signed)
 Unable to pended TRUEplus Lancets request

## 2024-03-04 ENCOUNTER — Telehealth: Payer: Self-pay | Admitting: Nurse Practitioner

## 2024-03-04 ENCOUNTER — Encounter: Payer: Self-pay | Admitting: Nurse Practitioner

## 2024-03-04 MED ORDER — HYDROXYZINE HCL 10 MG PO TABS: 10.0000 mg | ORAL_TABLET | Freq: Every evening | ORAL | 0 refills | Status: AC | PRN

## 2024-03-04 MED ORDER — JARDIANCE 25 MG PO TABS: 25.0000 mg | ORAL_TABLET | Freq: Every day | ORAL | 0 refills | Status: DC

## 2024-03-04 MED ORDER — ATORVASTATIN CALCIUM 10 MG PO TABS: 10.0000 mg | ORAL_TABLET | Freq: Every day | ORAL | 0 refills | Status: AC

## 2024-03-04 MED ORDER — OMEPRAZOLE 20 MG PO CPDR: 20.0000 mg | DELAYED_RELEASE_CAPSULE | Freq: Every day | ORAL | 0 refills | Status: AC

## 2024-03-04 MED ORDER — INSULIN GLARGINE-YFGN 100 UNIT/ML ~~LOC~~ SOPN: 54.0000 [IU] | PEN_INJECTOR | Freq: Every day | SUBCUTANEOUS | 0 refills | Status: AC

## 2024-03-04 MED ORDER — CLOMIPHENE CITRATE 50 MG PO TABS: 25.0000 mg | ORAL_TABLET | Freq: Every day | ORAL | 0 refills | Status: AC

## 2024-03-04 MED ORDER — AMLODIPINE BESYLATE 5 MG PO TABS: 5.0000 mg | ORAL_TABLET | Freq: Every day | ORAL | 0 refills | Status: AC

## 2024-03-04 MED ORDER — TIRZEPATIDE 5 MG/0.5ML ~~LOC~~ SOAJ: 5.0000 mg | SUBCUTANEOUS | 0 refills | Status: DC

## 2024-03-04 MED ORDER — LOSARTAN POTASSIUM 50 MG PO TABS: 50.0000 mg | ORAL_TABLET | Freq: Every day | ORAL | 0 refills | Status: DC

## 2024-03-04 MED ORDER — PENTOXIFYLLINE ER 400 MG PO TBCR: 400.0000 mg | EXTENDED_RELEASE_TABLET | Freq: Three times a day (TID) | ORAL | 0 refills | Status: AC

## 2024-03-04 MED ORDER — INSULIN LISPRO (1 UNIT DIAL) 100 UNIT/ML (KWIKPEN): PEN_INJECTOR | SUBCUTANEOUS | 0 refills | Status: DC

## 2024-03-04 MED ORDER — IBUPROFEN 800 MG PO TABS: 800.0000 mg | ORAL_TABLET | Freq: Three times a day (TID) | ORAL | 0 refills | Status: AC | PRN

## 2024-03-04 MED ORDER — TRUEPLUS LANCETS 30G MISC: 1.0000 | Freq: Two times a day (BID) | 0 refills | Status: AC

## 2024-03-04 MED ORDER — TADALAFIL 5 MG PO TABS: 5.0000 mg | ORAL_TABLET | Freq: Every day | ORAL | 0 refills | Status: AC

## 2024-03-04 NOTE — Telephone Encounter (Signed)
 Pt has moved to [place] .  Please advise if you will fill med until he finds  a new PCP in [place]. KH

## 2024-03-04 NOTE — Telephone Encounter (Signed)
 Dr Arlene NPI ETTER President Nichols's supervising physician)provided to staff at [facility] for medication refill

## 2024-03-06 ENCOUNTER — Telehealth: Payer: Self-pay | Admitting: Internal Medicine

## 2024-03-06 MED ORDER — LOSARTAN POTASSIUM 50 MG PO TABS: 50.0000 mg | ORAL_TABLET | Freq: Every day | ORAL | 0 refills | Status: AC

## 2024-03-06 MED ORDER — TIRZEPATIDE 5 MG/0.5ML ~~LOC~~ SOAJ: 5.0000 mg | SUBCUTANEOUS | 0 refills | Status: AC

## 2024-03-06 MED ORDER — JARDIANCE 25 MG PO TABS: 25.0000 mg | ORAL_TABLET | Freq: Every day | ORAL | 0 refills | Status: AC

## 2024-03-06 MED ORDER — INSULIN LISPRO (1 UNIT DIAL) 100 UNIT/ML (KWIKPEN): PEN_INJECTOR | SUBCUTANEOUS | 0 refills | Status: AC

## 2024-03-06 NOTE — Telephone Encounter (Signed)
 Refills sent patient will need to see pcp or new endo for further refills

## 2024-03-06 NOTE — Telephone Encounter (Signed)
 MEDICATION: All prescribed by [facility]  PHARMACY: [facility] [address] [place] [address]   HAS THE PATIENT CONTACTED THEIR PHARMACY?    LAST REFILL:  @@LASTREFILL @  IS THIS A 90 DAY SUPPLY :   IS PATIENT OUT OF MEDICATION:   IF NOT; HOW MUCH IS LEFT:   LAST APPOINTMENT DATE: @Visit  date not found  NEXT APPOINTMENT DATE:@Visit  date not found  DO WE HAVE YOUR PERMISSION TO LEAVE A DETAILED MESSAGE?: Yes  OTHER COMMENTS: Patient moved to [facility] and does not have a Endocrinologist yet.   **Let patient know to contact pharmacy at the end of the day to make sure medication is ready. **  ** Please notify patient to allow 48-72 hours to process**  **Encourage patient to contact the pharmacy for refills or they can request refills through [facility]**

## 2024-03-26 ENCOUNTER — Other Ambulatory Visit (HOSPITAL_COMMUNITY): Payer: Self-pay
